# Patient Record
Sex: Male | Born: 1940
Health system: Southern US, Community
[De-identification: ages and names within clinical notes are randomized; demographics above are authoritative.]

## PROBLEM LIST (undated history)

## (undated) DIAGNOSIS — I10 Essential (primary) hypertension: Secondary | ICD-10-CM

## (undated) DIAGNOSIS — T4145XA Adverse effect of unspecified anesthetic, initial encounter: Secondary | ICD-10-CM

## (undated) DIAGNOSIS — Z8041 Family history of malignant neoplasm of ovary: Secondary | ICD-10-CM

## (undated) DIAGNOSIS — G459 Transient cerebral ischemic attack, unspecified: Secondary | ICD-10-CM

## (undated) DIAGNOSIS — N390 Urinary tract infection, site not specified: Secondary | ICD-10-CM

## (undated) DIAGNOSIS — Z8546 Personal history of malignant neoplasm of prostate: Secondary | ICD-10-CM

## (undated) DIAGNOSIS — K219 Gastro-esophageal reflux disease without esophagitis: Secondary | ICD-10-CM

## (undated) DIAGNOSIS — J45909 Unspecified asthma, uncomplicated: Secondary | ICD-10-CM

## (undated) DIAGNOSIS — I2510 Atherosclerotic heart disease of native coronary artery without angina pectoris: Secondary | ICD-10-CM

## (undated) DIAGNOSIS — Z85820 Personal history of malignant melanoma of skin: Secondary | ICD-10-CM

## (undated) DIAGNOSIS — I341 Nonrheumatic mitral (valve) prolapse: Secondary | ICD-10-CM

## (undated) DIAGNOSIS — T8859XA Other complications of anesthesia, initial encounter: Secondary | ICD-10-CM

## (undated) DIAGNOSIS — C801 Malignant (primary) neoplasm, unspecified: Secondary | ICD-10-CM

## (undated) HISTORY — PX: APPENDECTOMY: SHX54

## (undated) HISTORY — DX: Family history of malignant neoplasm of ovary: Z80.41

## (undated) HISTORY — DX: Personal history of malignant melanoma of skin: Z85.820

## (undated) HISTORY — DX: Gastro-esophageal reflux disease without esophagitis: K21.9

## (undated) HISTORY — DX: Essential (primary) hypertension: I10

## (undated) HISTORY — DX: Personal history of malignant neoplasm of prostate: Z85.46

## (undated) HISTORY — DX: Nonrheumatic mitral (valve) prolapse: I34.1

## (undated) HISTORY — DX: Atherosclerotic heart disease of native coronary artery without angina pectoris: I25.10

---

## 1997-10-31 ENCOUNTER — Ambulatory Visit (HOSPITAL_COMMUNITY): Admission: RE | Admit: 1997-10-31 | Discharge: 1997-10-31 | Payer: Self-pay | Admitting: Family Medicine

## 1998-04-06 ENCOUNTER — Ambulatory Visit (HOSPITAL_COMMUNITY): Admission: RE | Admit: 1998-04-06 | Discharge: 1998-04-06 | Payer: Self-pay | Admitting: Family Medicine

## 1998-04-06 ENCOUNTER — Encounter: Payer: Self-pay | Admitting: Family Medicine

## 2002-06-14 ENCOUNTER — Ambulatory Visit (HOSPITAL_COMMUNITY): Admission: RE | Admit: 2002-06-14 | Discharge: 2002-06-14 | Payer: Self-pay | Admitting: *Deleted

## 2004-02-06 ENCOUNTER — Ambulatory Visit (HOSPITAL_COMMUNITY): Admission: RE | Admit: 2004-02-06 | Discharge: 2004-02-06 | Payer: Self-pay | Admitting: Family Medicine

## 2004-02-20 ENCOUNTER — Ambulatory Visit (HOSPITAL_COMMUNITY): Admission: RE | Admit: 2004-02-20 | Discharge: 2004-02-20 | Payer: Self-pay | Admitting: Family Medicine

## 2004-03-22 ENCOUNTER — Encounter: Admission: RE | Admit: 2004-03-22 | Discharge: 2004-03-22 | Payer: Self-pay | Admitting: Family Medicine

## 2004-04-05 ENCOUNTER — Ambulatory Visit (HOSPITAL_COMMUNITY): Admission: RE | Admit: 2004-04-05 | Discharge: 2004-04-05 | Payer: Self-pay | Admitting: Orthopedic Surgery

## 2008-05-05 ENCOUNTER — Encounter: Payer: Self-pay | Admitting: Internal Medicine

## 2009-09-19 ENCOUNTER — Encounter (HOSPITAL_COMMUNITY): Admission: RE | Admit: 2009-09-19 | Discharge: 2009-11-22 | Payer: Self-pay | Admitting: Internal Medicine

## 2009-12-22 ENCOUNTER — Ambulatory Visit (HOSPITAL_COMMUNITY): Admission: RE | Admit: 2009-12-22 | Discharge: 2009-12-22 | Payer: Self-pay | Admitting: Internal Medicine

## 2010-02-05 ENCOUNTER — Ambulatory Visit: Payer: Self-pay | Admitting: Internal Medicine

## 2010-02-05 DIAGNOSIS — R011 Cardiac murmur, unspecified: Secondary | ICD-10-CM | POA: Insufficient documentation

## 2010-02-05 DIAGNOSIS — I10 Essential (primary) hypertension: Secondary | ICD-10-CM | POA: Insufficient documentation

## 2010-02-05 DIAGNOSIS — M810 Age-related osteoporosis without current pathological fracture: Secondary | ICD-10-CM | POA: Insufficient documentation

## 2010-02-05 DIAGNOSIS — R059 Cough, unspecified: Secondary | ICD-10-CM | POA: Insufficient documentation

## 2010-02-05 DIAGNOSIS — R05 Cough: Secondary | ICD-10-CM

## 2010-02-06 ENCOUNTER — Ambulatory Visit: Payer: Self-pay | Admitting: Internal Medicine

## 2010-02-07 ENCOUNTER — Encounter: Payer: Self-pay | Admitting: Internal Medicine

## 2010-02-07 ENCOUNTER — Ambulatory Visit (HOSPITAL_COMMUNITY): Admission: RE | Admit: 2010-02-07 | Discharge: 2010-02-07 | Payer: Self-pay | Admitting: Internal Medicine

## 2010-02-07 ENCOUNTER — Ambulatory Visit: Payer: Self-pay | Admitting: Cardiovascular Disease

## 2010-02-07 ENCOUNTER — Ambulatory Visit: Payer: Self-pay

## 2010-02-09 ENCOUNTER — Ambulatory Visit: Payer: Self-pay | Admitting: Internal Medicine

## 2010-02-09 DIAGNOSIS — J986 Disorders of diaphragm: Secondary | ICD-10-CM | POA: Insufficient documentation

## 2010-02-14 ENCOUNTER — Ambulatory Visit (HOSPITAL_COMMUNITY): Admission: RE | Admit: 2010-02-14 | Discharge: 2010-02-14 | Payer: Self-pay | Admitting: Cardiology

## 2010-02-20 ENCOUNTER — Inpatient Hospital Stay (HOSPITAL_COMMUNITY)
Admission: EM | Admit: 2010-02-20 | Discharge: 2010-02-21 | Payer: Self-pay | Source: Home / Self Care | Admitting: Emergency Medicine

## 2010-02-20 ENCOUNTER — Ambulatory Visit (HOSPITAL_COMMUNITY): Admission: RE | Admit: 2010-02-20 | Discharge: 2010-02-20 | Payer: Self-pay | Admitting: Cardiology

## 2010-02-26 ENCOUNTER — Ambulatory Visit: Payer: Self-pay | Admitting: Thoracic Surgery (Cardiothoracic Vascular Surgery)

## 2010-02-26 ENCOUNTER — Encounter: Payer: Self-pay | Admitting: Internal Medicine

## 2010-03-01 ENCOUNTER — Encounter: Payer: Self-pay | Admitting: Internal Medicine

## 2010-03-12 ENCOUNTER — Telehealth: Payer: Self-pay | Admitting: Internal Medicine

## 2010-03-14 ENCOUNTER — Ambulatory Visit (HOSPITAL_COMMUNITY)
Admission: RE | Admit: 2010-03-14 | Discharge: 2010-03-14 | Payer: Self-pay | Source: Home / Self Care | Admitting: Internal Medicine

## 2010-03-19 ENCOUNTER — Telehealth: Payer: Self-pay | Admitting: Internal Medicine

## 2010-04-06 ENCOUNTER — Ambulatory Visit: Payer: Self-pay | Admitting: Thoracic Surgery (Cardiothoracic Vascular Surgery)

## 2010-04-30 ENCOUNTER — Ambulatory Visit (HOSPITAL_COMMUNITY)
Admission: RE | Admit: 2010-04-30 | Discharge: 2010-05-01 | Payer: Self-pay | Source: Home / Self Care | Attending: Cardiology | Admitting: Cardiology

## 2010-04-30 ENCOUNTER — Ambulatory Visit: Admit: 2010-04-30 | Payer: Self-pay | Admitting: Thoracic Surgery (Cardiothoracic Vascular Surgery)

## 2010-04-30 HISTORY — PX: CORONARY ANGIOPLASTY WITH STENT PLACEMENT: SHX49

## 2010-05-02 LAB — BASIC METABOLIC PANEL
BUN: 17 mg/dL (ref 6–23)
CO2: 31 mEq/L (ref 19–32)
Calcium: 9.3 mg/dL (ref 8.4–10.5)
Chloride: 105 mEq/L (ref 96–112)
Creatinine, Ser: 1.13 mg/dL (ref 0.4–1.5)
GFR calc Af Amer: >60 mL/min (ref >60)
GFR calc non Af Amer: >60 mL/min (ref >60)
Glucose, Bld: 97 mg/dL (ref 70–99)
Potassium: 4 mEq/L (ref 3.5–5.1)
Sodium: 141 mEq/L (ref 135–145)

## 2010-05-02 LAB — PLATELET INHIBITION P2Y12
P2Y12 % Inhibition: 0 %
Platelet Function  P2Y12: 344 [PRU] (ref 194–418)
Platelet Function Baseline: 319 [PRU] (ref 194–418)

## 2010-05-02 LAB — CBC
HCT: 41.2 % (ref 39.0–52.0)
Hemoglobin: 13.9 g/dL (ref 13.0–17.0)
MCH: 30.7 pg (ref 26.0–34.0)
MCHC: 33.7 g/dL (ref 30.0–36.0)
MCV: 90.9 fL (ref 78.0–100.0)
Platelets: 193 10*3/uL (ref 150–400)
RBC: 4.53 MIL/uL (ref 4.22–5.81)
RDW: 13.4 % (ref 11.5–15.5)
WBC: 11.7 10*3/uL — ABNORMAL HIGH (ref 4.0–10.5)

## 2010-05-04 ENCOUNTER — Ambulatory Visit (HOSPITAL_COMMUNITY)
Admission: RE | Admit: 2010-05-04 | Discharge: 2010-05-04 | Payer: Self-pay | Source: Home / Self Care | Attending: Thoracic Surgery (Cardiothoracic Vascular Surgery) | Admitting: Thoracic Surgery (Cardiothoracic Vascular Surgery)

## 2010-05-05 ENCOUNTER — Encounter: Payer: Self-pay | Admitting: Family Medicine

## 2010-05-07 ENCOUNTER — Ambulatory Visit
Admission: RE | Admit: 2010-05-07 | Discharge: 2010-05-07 | Payer: Self-pay | Source: Home / Self Care | Attending: Thoracic Surgery (Cardiothoracic Vascular Surgery) | Admitting: Thoracic Surgery (Cardiothoracic Vascular Surgery)

## 2010-05-07 ENCOUNTER — Encounter: Payer: Self-pay | Admitting: Internal Medicine

## 2010-05-07 LAB — PLATELET INHIBITION P2Y12
P2Y12 % Inhibition: 30 %
Platelet Function  P2Y12: 143 [PRU] — ABNORMAL LOW (ref 194–418)
Platelet Function Baseline: 203 [PRU] (ref 194–418)

## 2010-05-08 NOTE — Assessment & Plan Note (Signed)
OFFICE VISIT  [name], [name] DOB:  May 16, 1940                                        May 07, 2010 CHART #:  [id number]  HISTORY OF PRESENT ILLNESS:  [name] returns for followup of severe mitral regurgitation.  He was last seen here in the office on April 06, 2010.  He was originally seen in consultation on February 26, 2010 and a full history and physical exam was dictated at that time.  [name] recently underwent PCI and stenting of his right coronary artery by Dr. Jacinto Halim, this was performed on April 30, 2010.  His coronary artery was stented with a large caliber (3.5 mm) very flex stent (non-drug eluting) with an excellent angiographic result.  He has not had any complications and he returns to our office for followup today.  Overall, he is getting along quite well.  He did have his stent performed via the right femoral artery approach.  His original catheterization was performed via the left femoral artery approach and imaging of the intra-abdominal aorta and iliac vessels at that time revealed no significant aortoiliac disease.  [name] is interested in proceeding with elective mitral valve repair at the end of February.  PHYSICAL EXAMINATION:  GENERAL:  Notable for well-appearing male. VITAL SIGNS:  Blood pressure 133/78, pulse 70 and regular, and oxygen saturation 98% on room air. CHEST:  Auscultation of the chest reveals clear breath sounds. CARDIOVASCULAR:  Regular rate and rhythm with a prominent grade 4/6 systolic murmur. ABDOMEN:  Soft and nontender. EXTREMITIES:  Warm and well perfused.  There is some bruising in the right groin from his recent catheterization.  There is no obvious sign of a false aneurysm or other problem.  The remainder of his physical exam is unchanged from previously.  IMPRESSION:  Mitral valve prolapse with severe mitral regurgitation. [name] has undergone successful percutaneous coronary  intervention and stenting of his high-grade right coronary artery stenosis by Dr. Jacinto Halim, last week.  PLAN:  We tentatively plan to proceed with right mini thoracotomy for mitral valve repair on Wednesday, February 29.  We will call in, a prescription for amiodarone to begin 1 week prior to surgery to decrease his risk of perioperative arrhythmias.  [name] will stop taking Plavix 5 days prior to surgery in anticipation of surgery.  We will see him back in the office on February 27 for any last minute questions.  Of note, [name] underwent a P2Y12 Plavix platelet inhibition test this week.  It reveals his baseline platelet function was on the low side at 203.  The P2Y12 platelet function was 143 consistent with 30% inhibition on Plavix therapy.  Salvatore Decent. [name], M.D. Electronically Signed  CHO/MEDQ  D:  05/07/2010  T:  05/07/2010  Job:  [id number]  cc:   Cristy Hilts. Jacinto Halim, MD Massie Maroon, MD Pramod P. Pearlean Brownie, MD Kalman Shan, MD

## 2010-05-15 NOTE — Letter (Signed)
Summary: Pt's Medical Hx/Patient  Pt's Medical Hx/Patient   Imported By: Sherian Rein 02/07/2010 14:38:19  _____________________________________________________________________  External Attachment:    Type:   Image     Comment:   External Document

## 2010-05-17 NOTE — Assessment & Plan Note (Signed)
Summary: CHRONIC COUGH/OK PER JEN//LED   Visit Type:  Initial Consult Copy to:  Dr. Pearson Grippe Primary Javae Braaten/Referring Tanayia Wahlquist:  Dr. Pearson Grippe  CC:  Pulmonary Consult for dry cough.  History of Present Illness: IOV 02/05/2010:   70 year old male. Known GERD since 1990s compliant with nexium at baseline till MArch 2011. Then sporadic use with final dc in July 2011 (due to lack of interest in taking medicines in general and also successful wean). Otherwise,  baseline till Feb 2011. Developed first cold and cough. Took hydromet and cough resolved in 1 week. Then in end of MArch MArch 2011 went to [place] for 2 weeks. Was Cold and raining there. At end of  trip, developed dry cough with raspy voice but did not have fever or sputum. Was seen by PMD and was given zpak and 1 month singulair. Cough resolved in 2 weeks. He was doing fine till mid Sept 2011. Went to [place] on  2 week vacation. Cold and Rainy weather in [place]. At end of tri while in [place] he developed dry cough and some sneezing. Upon return Rx with Zpak first with hydromet prn  but cough persisted. 10 days ago ried singulair for a few days but no relief so stopped. Now still with cough. Noted that 70% of the 30 of people in [place] vacation had cough but all have cough rsolved except MR. Isharanit. Currently with dry cough with raspy voice. Worst when sleeping flat wihtout pillow. Improved when sleeping with many pillows and chewing raw ginger.  Also notee that at baseline nexium always helps with raspy voice and dc of nexium returns in relapse of nexium. Cough occassionally associated with sneezing. Denies sinus drainage, edema, dyspnea, chest pain, fever, weight loss, sputum, wheezing (although family thinks so when he is quiet). Admits to no decrease in treadmill exercise - 5 miles and walkng park  Plans to travel to [place] 02/20/2010  NOTE: d/w Dr Selena Batten his PMD - old records being faxed.  Preventive Screening-Counseling &  Management  Alcohol-Tobacco     Smoking Status: never  Current Medications (verified): 1)  Hyzaar 100-12.5 Mg Tabs (Losartan Potassium-Hctz) .... Take 1 Tablet By Mouth Once A Day  Allergies (verified): 1)  ! Pcn  Past History:  Past Medical History: #Hypertension (MIld)  - dxed 2008. STarted on hyzaar  - 15# weigh loss in 2008/2009 #Rib fracture - 1992 following lifting book case  - Osteopenia - diagnosed 1992.  - "bone scan showed 25% reduction in density" -   - 1992 commenced fosfamax. DC'ed due to stomach pain (labelled as GERD). Started Boniva 1997 -> 2010 moved back to generic Fosfamax due to [employer] -> worsening "stomach acidity"/GERD again -> moved to injection Boniva Q 3 months end 2010 #Normal Vitamin D Levels #Pneumonia 02/06/2004  - Treated as outpatient #GERD  - [id number]. Loves oily, spicy food    - Normal UGI and LGI scope 2004 by Dr. Virginia Rochester. Hx of mild redness on repeat scope 2007 NOS  - Nexium two times a day since 1997. Dc'ed in March 2011 on own accord after giving a trial of weaning #SPRING ALLERGIES  - new diagnosis since 2010  - strted Zyrtec and Xyzal in 2010 #Hx of ASTHMA   - "trace of asthma" as thought of by family members becuase he states they think he wheezes or talks too fast #Hx of MURMUr NOS  - does not recollect echo    Past Surgical History: Normal UGI scope  and cOlonoscopy - 2004 by Dr. Fransisco Beau  Family History: Father-died age 41 old age and heart failure Mother-died age 17, diabetes, HTN Pt has 5 brothers and 2 sisters. Patient is 4th in order of siblings 1st brother-alive age 68 2nd brother-died age 28 liver failure 3rd brother-died age 57 lung cancer 4th brother-patient 5th Brother-alive and in good health 1-Sister-died age 86-lots of issues 2nd sister-alive age 21 in good health  Social History: Patient never smoked.  ETOH: 2 drinks per week Retired [place] to [place] in Sept 2011 MarriedSmoking Status:   never  Review of Systems       The patient complains of non-productive cough, sore throat, and sneezing.  The patient denies shortness of breath with activity, shortness of breath at rest, productive cough, coughing up blood, chest pain, irregular heartbeats, acid heartburn, indigestion, loss of appetite, weight change, abdominal pain, difficulty swallowing, tooth/dental problems, headaches, nasal congestion/difficulty breathing through nose, itching, ear ache, anxiety, depression, hand/feet swelling, joint stiffness or pain, rash, change in color of mucus, and fever.    Vital Signs:  Patient profile:   70 year old male Height:      67 inches Weight:      165.25 pounds BMI:     25.98 O2 Sat:      96 % on Room air Temp:     97.8 degrees F oral Pulse rate:   72 / minute BP sitting:   108 / 62  (right arm) Cuff size:   regular  Vitals Entered By: Carron Curie CMA (February 05, 2010 4:34 PM)  O2 Flow:  Room air CC: Pulmonary Consult for dry cough Comments Medications reviewed with patient Carron Curie CMA  February 05, 2010 4:32 PM Daytime phone number verified with patient.    Physical Exam  General:  well developed, well nourished, in no acute distress Head:  normocephalic and atraumatic Eyes:  PERRLA/EOM intact; conjunctiva and sclera clear Ears:  TMs intact and clear with normal canals Nose:  no deformity, discharge, inflammation, or lesions Mouth:  no deformity or lesions Neck:  no masses, thyromegaly, or abnormal cervical nodes Chest Wall:  no deformities noted Lungs:  clear bilaterally to auscultation and percussion Heart:  regular rhythm, normal rate, no rubs, no gallops, and Grade  3/6 SEM loudest at apex -->carotid conduction + Abdomen:  bowel sounds positive; abdomen soft and non-tender without masses, or organomegaly Msk:  no deformity or scoliosis noted with normal posture Pulses:  pulses normal Extremities:  no clubbing, cyanosis, edema, or deformity  noted Neurologic:  CN II-XII grossly intact with normal reflexes, coordination, muscle strength and tone Skin:  intact without lesions or rashes Cervical Nodes:  no significant adenopathy Axillary Nodes:  no significant adenopathy Psych:  alert and cooperative; normal mood and affect; normal attention span and concentration   Impression & Recommendations:  Problem # 1:  COUGH (ICD-786.2) Assessment New Sounds like post viral reactive cough accentuated by GERD +/- sinus drainage (Based on hx of cough worse lying flat, active gerd, being off nexium, worsening raspy voice when off nexium, and scratchy sensation in throat). There might or might not be underlying asthma'; atleast his family is concerned about. REassured that findings are not c/w TB but he iss keen on a cxr.   PLAN Advised that we need to control GERD and sinus issus otherwise there is risk of deterioration to cyclical cough For gerd - avoid spices, and oily food. restart nexium two times a day For sinus drainge -  use netti pot and sample steroid nasal spray Will get cxr and full PFT. If thse are negative will get methacholine challenge test do all this week and decide on [place] trip by end of this week or by next week Orders: Pulmonary Referral (Pulmonary) T-2 View CXR (71020TC) Consultation Level III (60454)  Problem # 2:  CARDIAC MURMUR, SYSTOLIC (ICD-785.2) Assessment: New get echo -ok per Dr. Selena Batten get old echo report Orders: Echo Referral (Echo) Consultation Level III (09811)  Medications Added to Medication List This Visit: 1)  Hyzaar 100-12.5 Mg Tabs (Losartan potassium-hctz) .... Take 1 tablet by mouth once a day 2)  Nexium 40 Mg Cpdr (Esomeprazole magnesium) .... By mouth twice daily. take one half hour before eating. 3)  Nasonex 50 Mcg/act Susp (Mometasone furoate) .... Two puffs each nostril daily  Patient Instructions: 1)  #COUGH  2)   - this could be due to post viral reactive cough, nasal drip, acid  reflux and/or undiagnosed asthma. We need to sort out all this 3)   - restart nexium  two times a day 4)   - avoid all spices and oily food 5)   - do not go to bed for 3h after dinner 6)  - take nasal steroid sample two times a day  7)   - do netti pot daily 8)  - have cxr 9)   - have full PFTs at [facility] or [place] 10)   - depending on cxr/pft result, might order more test 11)  #MURMUR 12)   -  Dr Lonny Prude getting an echo 13)  #FOLLOWUP 14)    we will discuss over phone Prescriptions: NASONEX 50 MCG/ACT  SUSP (MOMETASONE FUROATE) Two puffs each nostril daily  #1 x 2   Entered and Authorized by:   Kalman Shan MD   Signed by:   Kalman Shan MD on 02/05/2010   Method used:   Historical   RxID:   [id number] NEXIUM 40 MG  CPDR (ESOMEPRAZOLE MAGNESIUM) By mouth twice daily. Take one half hour before eating.  #60 x 0   Entered and Authorized by:   Kalman Shan MD   Signed by:   Kalman Shan MD on 02/05/2010   Method used:   Historical   RxID:   [id number]

## 2010-05-17 NOTE — Letter (Signed)
Summary: [facility]  [facility]   Imported By: Lester [place] 03/02/2010 09:25:48  _____________________________________________________________________  External Attachment:    Type:   Image     Comment:   External Document  Appended Document: [facility] Candise Bowens, plse fax my office notes to Dr. Tressie Stalker of [facility]. Thanks, MR  Appended Document: [facility] note faxed.

## 2010-05-17 NOTE — Miscellaneous (Signed)
Summary: Orders Update pft charges  Clinical Lists Changes  Orders: Added new Service order of Carbon Monoxide diffusing w/capacity (94720) - Signed Added new Service order of Lung Volumes (94240) - Signed Added new Service order of Spirometry (Pre & Post) (94060) - Signed 

## 2010-05-17 NOTE — Progress Notes (Signed)
Summary: Sniff test  Phone Note Other Incoming   Caller: Dr. Marchelle Gearing Summary of Call: Dr. Marchelle Gearing wants a SNIFF TEST ordered for this pt to check to see if his diapraghm is paralyzed or not. Order placed. pt aware of need for test.  Initial call taken by: Carron Curie CMA,  March 12, 2010 4:32 PM

## 2010-05-17 NOTE — Assessment & Plan Note (Signed)
Summary: discuss test   Visit Type:  Follow-up Copy to:  Dr. Pearson Grippe Primary Ordean Fouts/Referring Tupac Jeffus:  Dr. Pearson Grippe  CC:  Pt here to discuss results of PFT. Marland Kitchen  History of Present Illness: IOV 02/05/2010:   70 year old male. Known GERD since 1990s compliant with nexium at baseline till MArch 2011. Then sporadic use with final dc in July 2011 (due to lack of interest in taking medicines in general and also successful wean). Otherwise,  baseline till Feb 2011. Developed first cold and cough. Took hydromet and cough resolved in 1 week. Then in end of MArch MArch 2011 went to [place] for 2 weeks. Was Cold and raining there. At end of  trip, developed dry cough with raspy voice but did not have fever or sputum. Was seen by PMD and was given zpak and 1 month singulair. Cough resolved in 2 weeks. He was doing fine till mid Sept 2011. Went to [place] on  2 week vacation. Cold and Rainy weather in [place]. At end of tri while in [place] he developed dry cough and some sneezing. Upon return Rx with Zpak first with hydromet prn  but cough persisted. 10 days ago ried singulair for a few days but no relief so stopped. Now still with cough. Noted that 70% of the 8 of people in [place] vacation had cough but all have cough rsolved except MR. Isharanit. Currently with dry cough with raspy voice. Worst when sleeping flat wihtout pillow. Improved when sleeping with many pillows and chewing raw ginger.  Also notee that at baseline nexium always helps with raspy voice and dc of nexium returns in relapse of nexium. Cough occassionally associated with sneezing. Denies sinus drainage, edema, dyspnea, chest pain, fever, weight loss, sputum, wheezing (although family thinks so when he is quiet). Admits to no decrease in treadmill exercise - 5 miles and walkng park  REC 1. PPI and Nasal ALLERGY control  2. Full PFT 3. CXR 4. ECHO For PAN SYSTOLIC MURMUR  February 09, 2010: Cough nearly gone after restarting nexium and  taking zyrtec. Review of tests today. CXR shows raised RT hemidiaphragm - no change since 2005. PFTs are c/w this and shos restriction with normal DLCO. This will explain the years of 'shallwo breathinng" he has. However, ther is a 13%, 300cc improvment after bronchodilator in FVC that I suspect he might have underlying asthma. However, cough has rapidly improved iwth sinus and GERD control. ECHO done raises issue of SEVERE MR with MILD LV and LA DILATATION but normal EF. Today, he states his cardiologist is Dr. Tomma Lightning and gives hix iof prior cardiac stress tests with him.   Preventive Screening-Counseling & Management  Alcohol-Tobacco     Smoking Status: never  Current Medications (verified): 1)  Hyzaar 100-12.5 Mg Tabs (Losartan Potassium-Hctz) .... Take 1 Tablet By Mouth Once A Day 2)  Nexium 40 Mg  Cpdr (Esomeprazole Magnesium) .... By Mouth Twice Daily. Take One Half Hour Before Eating. 3)  Nasonex 50 Mcg/act  Susp (Mometasone Furoate) .... Two Puffs Each Nostril Daily 4)  Zyrtec Allergy 10 Mg Tabs (Cetirizine Hcl) .... Take 1 Tablet By Mouth Once A Day 5)  Calcium 500 Mg Tabs (Calcium) .... Take 1 Tablet By Mouth Once A Day 6)  Vitamin C 100 Mg Tabs (Ascorbic Acid) .... Take 1 Tablet By Mouth Once A Day 7)  Aspir-Low 81 Mg Tbec (Aspirin) .... Take 1 Tablet By Mouth Once A Day 8)  Glucosamine 500 Mg Caps (  Glucosamine Sulfate) .... Take 1 Tablet By Mouth Once A Day 9)  Boniva Injection .... Every Three Months 10)  Nexium 40 Mg Cpdr (Esomeprazole Magnesium) .... Take 1 Tablet By Mouth Once A Day 11)  Nasonex 50 Mcg/act Susp (Mometasone Furoate) .... 2 Sprays Each Nostril Once Daily  Allergies (verified): 1)  ! Pcn  Past History:  Past medical, surgical, family and social histories (including risk factors) reviewed, and no changes noted (except as noted below).  Past Medical History: Reviewed history from 02/05/2010 and no changes required. #Hypertension (MIld)  - dxed 2008.  STarted on hyzaar  - 15# weigh loss in 2008/2009 #Rib fracture - 1992 following lifting book case  - Osteopenia - diagnosed 1992.  - "bone scan showed 25% reduction in density" -   - 1992 commenced fosfamax. DC'ed due to stomach pain (labelled as GERD). Started Boniva 1997 -> 2010 moved back to generic Fosfamax due to [employer] -> worsening "stomach acidity"/GERD again -> moved to injection Boniva Q 3 months end 2010 #Normal Vitamin D Levels #Pneumonia 02/06/2004  - Treated as outpatient #GERD  - [id number]. Loves oily, spicy food    - Normal UGI and LGI scope 2004 by Dr. Virginia Rochester. Hx of mild redness on repeat scope 2007 NOS  - Nexium two times a day since 1997. Dc'ed in March 2011 on own accord after giving a trial of weaning #SPRING ALLERGIES  - new diagnosis since 2010  - strted Zyrtec and Xyzal in 2010 #Hx of ASTHMA   - "trace of asthma" as thought of by family members becuase he states they think he wheezes or talks too fast #Hx of MURMUr NOS  - does not recollect echo    Past Surgical History: Reviewed history from 02/05/2010 and no changes required. Normal UGI scope and cOlonoscopy - 2004 by Dr. Fransisco Beau  Family History: Reviewed history from 02/05/2010 and no changes required. Father-died age 46 old age and heart failure Mother-died age 26, diabetes, HTN Pt has 5 brothers and 2 sisters. Patient is 4th in order of siblings 1st brother-alive age 23 2nd brother-died age 69 liver failure 3rd brother-died age 23 lung cancer 4th brother-patient 5th Brother-alive and in good health 1-Sister-died age 65-lots of issues 2nd sister-alive age 36 in good health  Social History: Reviewed history from 02/05/2010 and no changes required. Patient never smoked.  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  Comments Medications reviewed with patient Carron Curie CMA  February 09, 2010 1:39 PM Daytime phone number verified with patient.    Physical Exam  General:  well developed, well nourished, in no acute distress Head:  normocephalic and atraumatic Eyes:  PERRLA/EOM intact; conjunctiva and sclera clear Ears:  TMs intact and clear with normal canals Nose:  no deformity, discharge, inflammation,  or lesions Mouth:  no deformity or lesions Neck:  no masses, thyromegaly, or abnormal cervical nodes Chest Wall:  no deformities noted Lungs:  clear bilaterally to auscultation and percussion Heart:  regular rhythm, normal rate, no rubs, no gallops, and Grade  3/6 SEM loudest at apex -->carotid conduction + Abdomen:  bowel sounds positive; abdomen soft and non-tender without masses, or organomegaly Msk:  no deformity or scoliosis noted with normal posture Pulses:  pulses normal Extremities:  no clubbing, cyanosis, edema, or deformity noted Neurologic:  CN II-XII grossly intact with normal reflexes, coordination, muscle strength and tone Skin:  intact without lesions or rashes Cervical Nodes:  no significant adenopathy Axillary Nodes:  no significant adenopathy Psych:  alert and cooperative; normal mood and affect; normal attention span and concentration   Impression & Recommendations:  Problem # 1:  COUGH (ICD-786.2) Assessment Improved  Improved to nearlyu resolved with sinus conttrol with zyrtec and Nexicum for PPI. but PFTs shows positive BD response. I think there are 4 elements to his cough. A Post viral reactive element. SInus and GERD  - controlling these have significantly improved/nearly resolved  cough. But there is possibility of asthma. We discussed and decided to observe given improvemnt. If cough recurs, then we will dp spirometry at time of illness  Orders: Est. 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 Patient Level III (98119)  Problem # 3:  CARDIAC MURMUR, SYSTOLIC (ICD-785.2) Assessment: Deteriorated  worsening Mitral Regurg with LV dilatation  plan see Dr Tomma Lightning asap - I spoke to DR [facility] and had echo report faxed. Dr Shon Baton will call patient avoid [place] trip until after seeing Dr. Shon Baton  Orders: Est. Patient Level III (14782)  Medications Added to Medication List This Visit: 1)  Zyrtec Allergy 10 Mg Tabs (Cetirizine hcl) .... Take 1 tablet by mouth once a day 2)  Calcium 500 Mg Tabs (Calcium) .... Take 1 tablet by mouth once a day 3)  Vitamin C 100 Mg Tabs (Ascorbic acid) .... Take 1 tablet by mouth once a day 4)  Aspir-low 81 Mg Tbec (Aspirin) .... Take 1 tablet by mouth once a day 5)  Glucosamine 500 Mg Caps (Glucosamine sulfate) .... Take 1 tablet by mouth once a day 6)  Boniva Injection  .... Every three months 7)  Nexium 40 Mg Cpdr (Esomeprazole magnesium) .... Take 1 tablet by mouth once a day 8)  Nasonex 50 Mcg/act Susp (Mometasone furoate) .... 2 sprays each nostril once daily  Patient Instructions: 1)  I will talk to Dr. 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 He is no longer iwth [facility]. He is now [place]  [facility]. fAx is [phone]. Please have it changed in [facility] Too  Appended Document: discuss test faxed.

## 2010-05-17 NOTE — Progress Notes (Signed)
Summary: reports to be faxed  Phone Note Outgoing Call   Summary of Call: a) pleas fax his PFT, my office note, CXR, ECHO - from his w2 MRNs to Dr. Silvestre Mesi ta [facility] b) please fax his sniff test to him [phone]. You have to cal first  Initial call taken by: Kalman Shan MD,  March 19, 2010 6:33 PM  Follow-up for Phone Call        records faxed to Dr. Silvestre Mesi and to the pt. pt aware. Carron Curie CMA  March 22, 2010 4:14 PM

## 2010-05-17 NOTE — Letter (Signed)
Summary: Referral & Patient History / [facility]   Referral & Patient History / [facility]   Imported By: Lennie Odor 03/22/2010 14:25:00  _____________________________________________________________________  External Attachment:    Type:   Image     Comment:   External Document

## 2010-05-18 NOTE — Discharge Summary (Signed)
  NAMEMESHACH, [name] NO.:  [id number]  MEDICAL RECORD NO.:  [id number]          PATIENT TYPE:  OIB  LOCATION:  6529                         FACILITY:  [facility]  PHYSICIAN:  Cristy Hilts. Jacinto Halim, MD       DATE OF BIRTH:  04/01/1941  DATE OF ADMISSION:  04/30/2010 DATE OF DISCHARGE:  05/01/2010                              DISCHARGE SUMMARY   DISCHARGE DIAGNOSES: 1. Atherosclerotic heart disease, successful percutaneous transluminal     coronary angioplasty and stenting of the mid and proximal right     coronary artery. 2. Hypertension. 3. Leukemia.  HISTORY:  [name] is a pleasant 70 year old gentleman who has severe mitral valve prolapse with severe mitral valve regurgitation.  He is scheduled for elective mitral valve repair by Dr. Tressie Stalker. During cardiac catheterization, he was found to have severe stenosis of the proximal right coronary artery and high-grade 95% stenosis of the mid right coronary artery.  Given this, he opted for minimal revision surgery for mitral valve repair.  Hence, we decided to proceed with stenting of his right coronary.  PROCEDURE PERFORMED:  IVUS-guided PTCA and stenting of mid right coronary and proximal right coronary artery.  BRIEF HISTORY AND HOSPITAL COURSE:  [name] is a 70 year old gentleman with a history of hypertension and hyperlipidemia.  He has been doing well and had complained of mild shortness of breath.  He was eventually found to have pseudomitral regurgitation.  After extensive discussion regarding proceeding with open bypass surgery versus minimally invasive surgery with mitral valve repair, he opted for PTCA and stenting and minimally invasive repair of the mitral valve.  He was now brought to the cardiac catheterization lab for IVUS guided intervention.  The procedure was some time in November 2011.  Procedure performed was IVUS-guided intervention to mid RCA with implantation of a 4.0  x 12 mm nondrug eluting VeriFLEX stent with reduction of stenosis from 95% to 0% and proximal right coronary artery with a 3.5 x 20 mm VeriFLEX stent with reduction of stenosis from 80% to 0%.  RECOMMENDATIONS:  The patient will be discharge home.  He will be followed up by Dr. Tressie Stalker.  DISCHARGE MEDICATIONS: 1. Plavix 75 mg p.o. daily. 2. Aspirin 325 mg p.o. daily. 3. Boniva, he will continue as every 3 months. 4. Calcium citrate 500 mg 2 tablets p.o. daily. 5. Crestor 10 mg p.o. daily. 6. Hyzaar 100/25 one p.o. daily. 7. Zyrtec 10 mg p.o. daily.  He will stop his Nexium and we will see how his GERD is, he will use Pepcid instead.  His labs remained stable postprocedure and EKG remained stable and the patient was asymptomatic.     Cristy Hilts. Jacinto Halim, MD     JRG/MEDQ  D:  05/01/2010  T:  05/01/2010  Job:  [id number]  cc:   Massie Maroon, MD  Electronically Signed by Yates Decamp MD on 05/18/2010 06:42:18 AM

## 2010-05-18 NOTE — Procedures (Signed)
[name], [name] NO.:  [id number]  MEDICAL RECORD NO.:  [id number]          PATIENT TYPE:  OIB  LOCATION:  6529                         FACILITY:  [facility]  PHYSICIAN:  Cristy Hilts. Jacinto Halim, MD       DATE OF BIRTH:  08/08/1940  DATE OF PROCEDURE:  04/30/2010 DATE OF DISCHARGE:                           CARDIAC CATHETERIZATION   PROCEDURE PERFORMED:  Percutaneous transluminal coronary angioplasty and stenting of the proximal and mid right coronary artery by intravascular ultrasound guidance.  SURGEON:  Cristy Hilts. Jacinto Halim, MD  INDICATIONS:  [name] is a 69-year gentleman with known coronary artery disease.  He had undergone cardiac catheterization on February 20, 2010, and was found to have a high-grade mid RCA and 80% proximal RCA stenosis with severe mitral valve regurgitation.  He has severe mitral prolapse.  After discussions regarding open mitral valve repair and single vessel coronary artery bypass grafting versus a PCI and minimally invasive mitral valve repair, the patient has opted for minimally invasive procedure.  After obtaining informed consent, he was brought to the Cardiac Catheterization Lab for IVUS-guided intervention to his right coronary artery which were focal lesions.  Right coronary artery:  The right coronary artery was a large vessel and a dominant vessel giving origin to moderate-sized PDA and a moderate- sized PL branch.  Proximal right coronary artery has an 80% long stenosis and the mid segment has a focal 95% stenosis.  INTRACORONARY VASCULAR ULTRASOUND DATA:  The proximal lesion lumen area measured 3.9 sq mm.  The mid lesion area could not be calculated as the catheter was hugging the lesion.  INTERVENTIONAL DATA:  Successful PTCA and direct stenting of the mid right coronary artery with a 4.0 x 12 mm VeriFLEX stent which was deployed at 10 atmospheric pressure for 60 seconds.  Successful PTCA and direct stenting of the  proximal right coronary with implantation of a 3.5 x 20 mm VeriFLEX stent.  This was deployed at 14 atmospheric pressure for 60 and 30 seconds each.  Having performed this, angiography revealed excellent results.  POST INTERVENTIONAL INTRACORONARY VASCULAR ULTRASOUND DATA:  The proximal lesion lumen area gain was from 3.9 sq mm to 11.48 sq mm.  Mid lumen area gain was from severe to 13.68 mm squared.  Excellent stent apposition and wall apposition was evident.  RECOMMENDATIONS:  The patient will be discharged home in the morning if he remains stable.  We will continue with aspirin and Plavix.  He will be scheduled for mitral valve repair by Dr. Tressie Stalker in the future.  TECHNIQUE OF PROCEDURE:  Under usual sterile precaution using a 6-French right femoral arterial access, a 6-French JR-4 guide catheter was utilized to engage the right coronary artery.  Using Angiomax for anticoagulation, Asahi Prowater guidewire was advanced into the right coronary artery.  Intracoronary nitroglycerine was also administered. Using Galaxy IVUS catheter, intravascular ultrasound interrogation of the right coronary artery was performed.  Having performed this, we measured the lumen stenosis length and also we measured the plaque burden.  Having performed this, we decided to stent this mid segment with a 4.0 x 12 mm VeriFLEX stent  which was a bare-metal stent, which was deployed at 10 atmospheric pressure for 60 seconds.  Having performed this, excellent results were evident.  Then, we decided to stent the proximal lesion with a 0.5 x 20 mm VeriFLEX.  Midsegment was 4.0 x 12.  The proximal segment was 3.5 x 20 mm VeriFLEX stent which was deployed at 12 atmospheric pressure for 60 seconds followed by 14 atmospheric pressure for 30 seconds pulling the balloon gently into the outside of the stent at the ostial right coronary artery.  Postprocedure intravascular ultrasound interrogation was again  performed and the results were confirmed.  Guide wire was withdrawn angiographically.  The guide catheter disengaged and pulled out of the body.  Right femoral arteriography was performed through the arterial access sheath and the access was closed with 6-French Angio-Seal with excellent hemostasis.  Mild oozing was evident which was injected with epinephrine and lidocaine in the Catheterization Lab.  No immediate complications.     Cristy Hilts. Jacinto Halim, MD     JRG/MEDQ  D:  04/30/2010  T:  04/30/2010  Job:  [id number]  cc:   Salvatore Decent. [name], M.D.  Electronically Signed by Yates Decamp MD on 05/18/2010 06:42:30 AM

## 2010-06-06 NOTE — Letter (Signed)
Summary: [facility]  [facility]   Imported By: Lennie Odor 05/31/2010 17:10:15  _____________________________________________________________________  External Attachment:    Type:   Image     Comment:   External Document

## 2010-06-11 ENCOUNTER — Other Ambulatory Visit: Payer: Self-pay | Admitting: Thoracic Surgery (Cardiothoracic Vascular Surgery)

## 2010-06-11 ENCOUNTER — Encounter: Payer: Medicare HMO | Admitting: Thoracic Surgery (Cardiothoracic Vascular Surgery)

## 2010-06-11 ENCOUNTER — Encounter (HOSPITAL_COMMUNITY)
Admission: RE | Admit: 2010-06-11 | Discharge: 2010-06-11 | Disposition: A | Discharge status: Home / Self Care | Payer: Medicare HMO | Source: Ambulatory Visit | Attending: Thoracic Surgery (Cardiothoracic Vascular Surgery) | Admitting: Thoracic Surgery (Cardiothoracic Vascular Surgery)

## 2010-06-11 ENCOUNTER — Ambulatory Visit (HOSPITAL_COMMUNITY)
Admission: RE | Admit: 2010-06-11 | Discharge: 2010-06-11 | Disposition: A | Discharge status: Home / Self Care | Payer: Medicare HMO | Source: Ambulatory Visit | Attending: Thoracic Surgery (Cardiothoracic Vascular Surgery) | Admitting: Thoracic Surgery (Cardiothoracic Vascular Surgery)

## 2010-06-11 DIAGNOSIS — Z01810 Encounter for preprocedural cardiovascular examination: Secondary | ICD-10-CM | POA: Insufficient documentation

## 2010-06-11 DIAGNOSIS — Z01818 Encounter for other preprocedural examination: Secondary | ICD-10-CM | POA: Insufficient documentation

## 2010-06-11 DIAGNOSIS — I059 Rheumatic mitral valve disease, unspecified: Secondary | ICD-10-CM

## 2010-06-11 DIAGNOSIS — Z01812 Encounter for preprocedural laboratory examination: Secondary | ICD-10-CM | POA: Insufficient documentation

## 2010-06-11 DIAGNOSIS — I340 Nonrheumatic mitral (valve) insufficiency: Secondary | ICD-10-CM

## 2010-06-12 ENCOUNTER — Encounter (INDEPENDENT_AMBULATORY_CARE_PROVIDER_SITE_OTHER): Payer: Medicare HMO | Admitting: Thoracic Surgery (Cardiothoracic Vascular Surgery)

## 2010-06-12 DIAGNOSIS — I059 Rheumatic mitral valve disease, unspecified: Secondary | ICD-10-CM

## 2010-06-12 NOTE — Assessment & Plan Note (Signed)
OFFICE VISIT  [name], [name] DOB:  Jan 16, 1941                                        June 12, 2010 CHART #:  [id number]  The patient returns to the office today with tentative plan to proceed with elective mitral valve repair tomorrow.  He was last seen in the office on May 07, 2010.  Since then he has remained clinically stable and he reports no episodes of chest pain nor any episodes of shortness of breath.  He is eager to proceed with his surgery.  We again reviewed the indications, risks, and potential benefits of surgery.  All of his questions have been addressed.  He stopped taking Plavix on February 24 in anticipation of his surgery.  He has been taking amiodarone prophylactically for the last week.  We plan to proceed as originally planned and all of his questions have been addressed.  Salvatore Decent. [name], M.D. Electronically Signed  CHO/MEDQ  D:  06/12/2010  T:  06/12/2010  Job:  [id number]

## 2010-06-13 ENCOUNTER — Inpatient Hospital Stay (HOSPITAL_COMMUNITY): Payer: Medicare HMO

## 2010-06-13 ENCOUNTER — Inpatient Hospital Stay (HOSPITAL_COMMUNITY)
Admission: RE | Admit: 2010-06-13 | Discharge: 2010-06-18 | DRG: 220 | Disposition: A | Discharge status: Home / Self Care | Payer: Medicare HMO | Source: Ambulatory Visit | Attending: Thoracic Surgery (Cardiothoracic Vascular Surgery) | Admitting: Thoracic Surgery (Cardiothoracic Vascular Surgery)

## 2010-06-13 ENCOUNTER — Other Ambulatory Visit: Payer: Self-pay | Admitting: Thoracic Surgery (Cardiothoracic Vascular Surgery)

## 2010-06-13 DIAGNOSIS — R05 Cough: Secondary | ICD-10-CM | POA: Diagnosis present

## 2010-06-13 DIAGNOSIS — M810 Age-related osteoporosis without current pathological fracture: Secondary | ICD-10-CM | POA: Diagnosis present

## 2010-06-13 DIAGNOSIS — K219 Gastro-esophageal reflux disease without esophagitis: Secondary | ICD-10-CM | POA: Diagnosis present

## 2010-06-13 DIAGNOSIS — E8779 Other fluid overload: Secondary | ICD-10-CM | POA: Diagnosis not present

## 2010-06-13 DIAGNOSIS — I059 Rheumatic mitral valve disease, unspecified: Principal | ICD-10-CM | POA: Diagnosis present

## 2010-06-13 DIAGNOSIS — Z9861 Coronary angioplasty status: Secondary | ICD-10-CM

## 2010-06-13 DIAGNOSIS — I10 Essential (primary) hypertension: Secondary | ICD-10-CM | POA: Diagnosis present

## 2010-06-13 DIAGNOSIS — I2510 Atherosclerotic heart disease of native coronary artery without angina pectoris: Secondary | ICD-10-CM | POA: Diagnosis present

## 2010-06-13 DIAGNOSIS — R059 Cough, unspecified: Secondary | ICD-10-CM | POA: Diagnosis present

## 2010-06-13 DIAGNOSIS — E162 Hypoglycemia, unspecified: Secondary | ICD-10-CM | POA: Diagnosis not present

## 2010-06-13 DIAGNOSIS — D62 Acute posthemorrhagic anemia: Secondary | ICD-10-CM | POA: Diagnosis not present

## 2010-06-13 DIAGNOSIS — Z7982 Long term (current) use of aspirin: Secondary | ICD-10-CM

## 2010-06-13 HISTORY — PX: MITRAL VALVE REPAIR: SHX2039

## 2010-06-13 LAB — CBC
HCT: 26.7 % — ABNORMAL LOW (ref 39.0–52.0)
HCT: 26.5 % — ABNORMAL LOW (ref 39.0–52.0)
Hemoglobin: 9.2 g/dL — ABNORMAL LOW (ref 13.0–17.0)
Hemoglobin: 9.1 g/dL — ABNORMAL LOW (ref 13.0–17.0)
MCH: 31.3 pg (ref 26.0–34.0)
MCH: 31.1 pg (ref 26.0–34.0)
MCHC: 34.5 g/dL (ref 30.0–36.0)
MCHC: 34.3 g/dL (ref 30.0–36.0)
MCV: 90.8 fL (ref 78.0–100.0)
MCV: 90.4 fL (ref 78.0–100.0)
Platelets: 85 10*3/uL — ABNORMAL LOW (ref 150–400)
Platelets: 116 10*3/uL — ABNORMAL LOW (ref 150–400)
RBC: 2.94 MIL/uL — ABNORMAL LOW (ref 4.22–5.81)
RBC: 2.93 MIL/uL — ABNORMAL LOW (ref 4.22–5.81)
RDW: 13.2 % (ref 11.5–15.5)
RDW: 13.4 % (ref 11.5–15.5)
WBC: 9 10*3/uL (ref 4.0–10.5)
WBC: 15.9 10*3/uL — ABNORMAL HIGH (ref 4.0–10.5)

## 2010-06-13 LAB — POCT I-STAT 4, (NA,K, GLUC, HGB,HCT)
Glucose, Bld: 114 mg/dL — ABNORMAL HIGH (ref 70–99)
Glucose, Bld: 126 mg/dL — ABNORMAL HIGH (ref 70–99)
Glucose, Bld: 98 mg/dL (ref 70–99)
Glucose, Bld: 120 mg/dL — ABNORMAL HIGH (ref 70–99)
Glucose, Bld: 132 mg/dL — ABNORMAL HIGH (ref 70–99)
Glucose, Bld: 112 mg/dL — ABNORMAL HIGH (ref 70–99)
Glucose, Bld: 63 mg/dL — ABNORMAL LOW (ref 70–99)
HCT: 40 % (ref 39.0–52.0)
HCT: 26 % — ABNORMAL LOW (ref 39.0–52.0)
HCT: 35 % — ABNORMAL LOW (ref 39.0–52.0)
HCT: 26 % — ABNORMAL LOW (ref 39.0–52.0)
HCT: 26 % — ABNORMAL LOW (ref 39.0–52.0)
HCT: 25 % — ABNORMAL LOW (ref 39.0–52.0)
HCT: 27 % — ABNORMAL LOW (ref 39.0–52.0)
Hemoglobin: 13.6 g/dL (ref 13.0–17.0)
Hemoglobin: 11.9 g/dL — ABNORMAL LOW (ref 13.0–17.0)
Hemoglobin: 8.8 g/dL — ABNORMAL LOW (ref 13.0–17.0)
Hemoglobin: 8.8 g/dL — ABNORMAL LOW (ref 13.0–17.0)
Hemoglobin: 8.8 g/dL — ABNORMAL LOW (ref 13.0–17.0)
Hemoglobin: 8.5 g/dL — ABNORMAL LOW (ref 13.0–17.0)
Hemoglobin: 9.2 g/dL — ABNORMAL LOW (ref 13.0–17.0)
Potassium: 4.3 mEq/L (ref 3.5–5.1)
Potassium: 4.1 mEq/L (ref 3.5–5.1)
Potassium: 4.2 mEq/L (ref 3.5–5.1)
Potassium: 4.9 mEq/L (ref 3.5–5.1)
Potassium: 4.9 mEq/L (ref 3.5–5.1)
Potassium: 4.8 mEq/L (ref 3.5–5.1)
Potassium: 4 mEq/L (ref 3.5–5.1)
Sodium: 140 mEq/L (ref 135–145)
Sodium: 134 mEq/L — ABNORMAL LOW (ref 135–145)
Sodium: 138 mEq/L (ref 135–145)
Sodium: 132 mEq/L — ABNORMAL LOW (ref 135–145)
Sodium: 133 mEq/L — ABNORMAL LOW (ref 135–145)
Sodium: 136 mEq/L (ref 135–145)
Sodium: 139 mEq/L (ref 135–145)

## 2010-06-13 LAB — POCT I-STAT, CHEM 8
BUN: 18 mg/dL (ref 6–23)
Calcium, Ion: 1.15 mmol/L (ref 1.12–1.32)
Chloride: 107 mEq/L (ref 96–112)
Creatinine, Ser: 1.2 mg/dL (ref 0.4–1.5)
Glucose, Bld: 153 mg/dL — ABNORMAL HIGH (ref 70–99)
HCT: 27 % — ABNORMAL LOW (ref 39.0–52.0)
Hemoglobin: 9.2 g/dL — ABNORMAL LOW (ref 13.0–17.0)
Potassium: 4.1 mEq/L (ref 3.5–5.1)
Sodium: 140 mEq/L (ref 135–145)
TCO2: 21 mmol/L (ref 0–100)

## 2010-06-13 LAB — POCT I-STAT 3, ART BLOOD GAS (G3+)
Acid-Base Excess: 2 mmol/L (ref 0.0–2.0)
Acid-Base Excess: 1 mmol/L (ref 0.0–2.0)
Acid-base deficit: 3 mmol/L — ABNORMAL HIGH (ref 0.0–2.0)
Acid-base deficit: 2 mmol/L (ref 0.0–2.0)
Acid-base deficit: 4 mmol/L — ABNORMAL HIGH (ref 0.0–2.0)
Bicarbonate: 29.6 mEq/L — ABNORMAL HIGH (ref 20.0–24.0)
Bicarbonate: 24.6 mEq/L — ABNORMAL HIGH (ref 20.0–24.0)
Bicarbonate: 22.7 mEq/L (ref 20.0–24.0)
Bicarbonate: 22.6 mEq/L (ref 20.0–24.0)
Bicarbonate: 20.9 mEq/L (ref 20.0–24.0)
O2 Saturation: 100 %
O2 Saturation: 100 %
O2 Saturation: 99 %
O2 Saturation: 99 %
O2 Saturation: 99 %
Patient temperature: 34.9
Patient temperature: 36.9
TCO2: 31 mmol/L (ref 0–100)
TCO2: 26 mmol/L (ref 0–100)
TCO2: 24 mmol/L (ref 0–100)
TCO2: 24 mmol/L (ref 0–100)
TCO2: 22 mmol/L (ref 0–100)
pCO2 arterial: 59.1 mmHg (ref 35.0–45.0)
pCO2 arterial: 34.9 mmHg — ABNORMAL LOW (ref 35.0–45.0)
pCO2 arterial: 43.9 mmHg (ref 35.0–45.0)
pCO2 arterial: 33.9 mmHg — ABNORMAL LOW (ref 35.0–45.0)
pCO2 arterial: 38.5 mmHg (ref 35.0–45.0)
pH, Arterial: 7.307 — ABNORMAL LOW (ref 7.350–7.450)
pH, Arterial: 7.457 — ABNORMAL HIGH (ref 7.350–7.450)
pH, Arterial: 7.32 — ABNORMAL LOW (ref 7.350–7.450)
pH, Arterial: 7.422 (ref 7.350–7.450)
pH, Arterial: 7.343 — ABNORMAL LOW (ref 7.350–7.450)
pO2, Arterial: 402 mmHg — ABNORMAL HIGH (ref 80.0–100.0)
pO2, Arterial: 358 mmHg — ABNORMAL HIGH (ref 80.0–100.0)
pO2, Arterial: 154 mmHg — ABNORMAL HIGH (ref 80.0–100.0)
pO2, Arterial: 108 mmHg — ABNORMAL HIGH (ref 80.0–100.0)
pO2, Arterial: 133 mmHg — ABNORMAL HIGH (ref 80.0–100.0)

## 2010-06-13 LAB — PROTIME-INR
INR: 1.68 — ABNORMAL HIGH (ref 0.00–1.49)
Prothrombin Time: 20 seconds — ABNORMAL HIGH (ref 11.6–15.2)

## 2010-06-13 LAB — PLATELET COUNT: Platelets: 147 10*3/uL — ABNORMAL LOW (ref 150–400)

## 2010-06-13 LAB — MRSA PCR SCREENING: MRSA by PCR: NEGATIVE

## 2010-06-13 LAB — HEMOGLOBIN AND HEMATOCRIT, BLOOD
HCT: 24.8 % — ABNORMAL LOW (ref 39.0–52.0)
Hemoglobin: 8.7 g/dL — ABNORMAL LOW (ref 13.0–17.0)

## 2010-06-13 LAB — MAGNESIUM: Magnesium: 2.8 mg/dL — ABNORMAL HIGH (ref 1.5–2.5)

## 2010-06-13 LAB — CREATININE, SERUM
Creatinine, Ser: 1.18 mg/dL (ref 0.4–1.5)
GFR calc Af Amer: >60 mL/min (ref >60)
GFR calc non Af Amer: >60 mL/min (ref >60)

## 2010-06-13 LAB — APTT: aPTT: 41 seconds — ABNORMAL HIGH (ref 24–37)

## 2010-06-14 ENCOUNTER — Inpatient Hospital Stay (HOSPITAL_COMMUNITY): Payer: Medicare HMO

## 2010-06-14 LAB — MAGNESIUM
Magnesium: 2.5 mg/dL (ref 1.5–2.5)
Magnesium: 2.7 mg/dL — ABNORMAL HIGH (ref 1.5–2.5)

## 2010-06-14 LAB — BASIC METABOLIC PANEL
BUN: 16 mg/dL (ref 6–23)
CO2: 24 mEq/L (ref 19–32)
Calcium: 7.7 mg/dL — ABNORMAL LOW (ref 8.4–10.5)
Chloride: 108 mEq/L (ref 96–112)
Creatinine, Ser: 1.27 mg/dL (ref 0.4–1.5)
GFR calc Af Amer: >60 mL/min (ref >60)
GFR calc non Af Amer: 56 mL/min — ABNORMAL LOW (ref >60)
Glucose, Bld: 143 mg/dL — ABNORMAL HIGH (ref 70–99)
Potassium: 4.2 mEq/L (ref 3.5–5.1)
Sodium: 137 mEq/L (ref 135–145)

## 2010-06-14 LAB — CBC
HCT: 24.4 % — ABNORMAL LOW (ref 39.0–52.0)
HCT: 26.2 % — ABNORMAL LOW (ref 39.0–52.0)
Hemoglobin: 8.5 g/dL — ABNORMAL LOW (ref 13.0–17.0)
Hemoglobin: 8.8 g/dL — ABNORMAL LOW (ref 13.0–17.0)
MCH: 31.7 pg (ref 26.0–34.0)
MCH: 30.9 pg (ref 26.0–34.0)
MCHC: 34.8 g/dL (ref 30.0–36.0)
MCHC: 33.6 g/dL (ref 30.0–36.0)
MCV: 91 fL (ref 78.0–100.0)
MCV: 91.9 fL (ref 78.0–100.0)
Platelets: 99 10*3/uL — ABNORMAL LOW (ref 150–400)
Platelets: 119 10*3/uL — ABNORMAL LOW (ref 150–400)
RBC: 2.68 MIL/uL — ABNORMAL LOW (ref 4.22–5.81)
RBC: 2.85 MIL/uL — ABNORMAL LOW (ref 4.22–5.81)
RDW: 13.7 % (ref 11.5–15.5)
RDW: 14 % (ref 11.5–15.5)
WBC: 14 10*3/uL — ABNORMAL HIGH (ref 4.0–10.5)
WBC: 16.1 10*3/uL — ABNORMAL HIGH (ref 4.0–10.5)

## 2010-06-14 LAB — CREATININE, SERUM
Creatinine, Ser: 1.51 mg/dL — ABNORMAL HIGH (ref 0.4–1.5)
GFR calc Af Amer: 56 mL/min — ABNORMAL LOW (ref >60)
GFR calc non Af Amer: 46 mL/min — ABNORMAL LOW (ref >60)

## 2010-06-14 LAB — GLUCOSE, CAPILLARY
Glucose-Capillary: 100 mg/dL — ABNORMAL HIGH (ref 70–99)
Glucose-Capillary: 114 mg/dL — ABNORMAL HIGH (ref 70–99)
Glucose-Capillary: 131 mg/dL — ABNORMAL HIGH (ref 70–99)
Glucose-Capillary: 142 mg/dL — ABNORMAL HIGH (ref 70–99)
Glucose-Capillary: 146 mg/dL — ABNORMAL HIGH (ref 70–99)
Glucose-Capillary: 79 mg/dL (ref 70–99)
Glucose-Capillary: 127 mg/dL — ABNORMAL HIGH (ref 70–99)
Glucose-Capillary: 130 mg/dL — ABNORMAL HIGH (ref 70–99)
Glucose-Capillary: 131 mg/dL — ABNORMAL HIGH (ref 70–99)

## 2010-06-14 NOTE — Op Note (Signed)
[name], [name] NO.:  [id number]  MEDICAL RECORD NO.:  [id number]           PATIENT TYPE:  I  LOCATION:  2304                         FACILITY:  [facility]  PHYSICIAN:  [name], M.D. DATE OF BIRTH:  25-Aug-1940  DATE OF PROCEDURE:  06/13/2010 DATE OF DISCHARGE:                              OPERATIVE REPORT   PREOPERATIVE DIAGNOSIS:  Mitral valve prolapse with severe mitral regurgitation.  POSTOPERATIVE DIAGNOSIS:  Mitral valve prolapse with severe mitral regurgitation.  PROCEDURE:  Right miniature thoracotomy for mitral valve repair (complex valvuloplasty including triangular resection of posterior leaflet with chordal transposition x1, artificial Gore-Tex chord replacement x2, plication of anterolateral commissure, and 28-mm Sorin Memo 3-D ring annuloplasty).  SURGEON:  Louis Decent. [name], MD  ASSISTANT:  Louis Fudge, PA  BRIEF CLINICAL NOTE:  The patient is a 70 year old male with history of hypertension and recently discovered mitral valve prolapse with mitral regurgitation.  A heart murmur was discovered on routine physical exam. Echocardiogram revealed mitral valve prolapse with severe mitral regurgitation.  Transesophageal echocardiogram confirmed the presence of flail segment of the posterior leaflet of the mitral valve.  There was normal left ventricular systolic function.  Cardiac catheterization was notable for single-vessel coronary artery disease with high-grade proximal stenosis of the right coronary artery.  The patient underwent PCI and stenting of the right coronary artery by Dr. Jacinto [name] and now presents for elective mitral valve repair.  A full consultation note has been dictated previously.  The patient and his family have been counseled at length regarding the indications, risks, and potential benefits of surgery.  Alternative treatment strategies have been discussed.  Alternative surgical approaches have been discussed  in detail and all of their questions have been addressed.  OPERATIVE FINDINGS: 1. 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 2. Normal left ventricular systolic function. 3. Moderate aortic regurgitation. 4. No residual mitral regurgitation following successful mitral valve     repair.  OPERATIVE NOTE IN DETAIL:  The patient was brought to the operating room on the above-mentioned date and central monitoring was established by the Anesthesia Team under the care and direction of Dr. Kipp [name]. Specifically, a Swan-Ganz catheter was placed through the left internal jugular approach.  A radial arterial line was placed.  Intravenous antibiotics were administered.  The patient was placed in the supine position on the operating table.  General endotracheal anesthesia was induced uneventfully.  The patient was initially intubated with a dual- lumen endotracheal tube.  A Foley catheter was placed.  The patient was positioned with a soft roll behind the right scapula and the neck gently extended and turned towards the left.  The patient's right neck, chest, abdomen, both groins, and both lower extremities were prepared and draped in a sterile manner.  Baseline transesophageal echocardiogram was performed by Dr. Noreene [name]. This confirmed the presence of severe mitral regurgitation with a flail segment of a portion of the middle scallop (P2) of the posterior leaflet.  The flail segment was somewhat eccentrically located towards the P1 segment.  There was  some prolapse of P1.  The anterior leaflet was normal.  There was moderate (2+) aortic insufficiency.  The jet of aortic insufficiency was slightly eccentric and emanated from the commissure between the non and left cusp of the aortic valve.  There was mild prolapse of the noncoronary cusp of the valve.  The jet of aortic insufficiency did not feel the  entire left ventricle and left ventricular chamber size was normal.  There was normal left ventricular systolic function.  No other significant abnormalities were noted.  A small incision was made in the right inguinal crease.  The anterior surface of the right common femoral artery and right common femoral vein were both dissected through the incision.  The femoral artery was normal in appearance.  Single lung ventilation was begun.  A 5-cm right anterolateral mini thoracotomy incision was performed beginning just above and lateral to the right nipple.  The incision was completed through the subcutaneous tissues with electrocautery.  The pectoralis major muscle was retracted medially and completely preserved.  The right pleural space was entered through the third intercostal space.  A soft tissue retractor was placed.  A pledgeted suture was placed in the dome of the right hemidiaphragm and retracted inferiorly to facilitate exposure.  Two 11- mm ports were placed through separate stab incisions inferiorly.  The right pleural space was insufflated continuously with carbon dioxide gas through the posterior port.  A longitudinal incision was made in the pericardium 3 cm anterior to the phrenic nerve.  Silk traction sutures were placed on either side to facilitate exposure.  The patient was placed in Trendelenburg position.  The right internal jugular vein was cannulated with Seldinger technique and a flexible guidewire was advanced into the right atrium.  A purse-string suture was placed in the anterior surface of the right greater saphenous bulb.  Two concentric purse-string sutures were placed on the anterior surface of the right common femoral artery.  The right common femoral vein was cannulated with a Seldinger technique through the greater saphenous bulb and a flexible guidewire was advanced using transesophageal echocardiogram up through the inferior vena cava through the  right atrium until the guidewire proceeded up the superior vena cava.  The femoral vein was dilated with serial dilators and cannulated with a 22- French long femoral venous cannula.  The femoral artery was cannulated with a Seldinger technique and a flexible guidewire was advanced until it could be appreciated intraluminally in the descending thoracic aorta using transesophageal echocardiogram.  The femoral artery was dilated with serial dilators and cannulated with a 16-French femoral arterial cannula.  The right internal jugular vein was now dilated with serial dilators and cannulated with a 14-French pediatric femoral venous cannula.  Cardiopulmonary bypass was begun.  Vacuum assist venous drainage was utilized.  Venous drainage and exposure was excellent.  The incision in the pericardium was extended in both directions.  A retrograde cardioplegic cannula was placed directly through the right atrium into the coronary sinus using transesophageal echocardiogram for guidance. An antegrade cardioplegic cannula was placed in the proximal anterolateral surface of the ascending thoracic aorta.  The patient was cooled to 28 degrees systemic temperature.  The aortic crossclamp was applied and cold blood cardioplegia was delivered initially in an antegrade fashion through the aortic root.  Supplemental cardioplegia was administered retrograde through the coronary sinus catheter.  The initial cardioplegic arrest was rapid with early diastolic arrest. Repeat doses of cardioplegia were administered every 20-30 minutes throughout the entire crossclamp portion of  the operation to maintain completely flat electrocardiogram.  Left ventricular myocardial protection was felt to be excellent.  A left atriotomy incision was performed posteriorly through the interatrial groove and continue partway across the back wall of the left atrium after opening the oblique sinus inferiorly.  A retractor blade was  placed into the left atrium and attached to a separate sidearm placed through a small stab incision just to the right side of the sternum through the third intercostal space.  Exposure of the floor of the left atrium in the mitral valve was excellent.  The mitral valve was carefully examined.  There was fibroelastic deficiency-type degenerative disease.  A portion of the P2 segment of the posterior leaflet eccentrically located towards the anterolateral commissure was flail with multiple ruptured primary chords.  There was some prolapse without flail of P1.  The remainder of the posterior leaflet was normal.  The anterior leaflet was normal.  The remainder of the subvalvular apparatus was normal.  No other abnormalities were noted.  Interrupted 2-0 Ethibond horizontal mattress sutures were placed circumferentially around the entire mitral valve annulus.  These sutures would ultimately be utilized for ring annuloplasty, and at this juncture they were utilized to suspend the valve symmetrically.  The flail segment of P2 was now repaired using a triangular resection.  After resection of a triangular segment of P2 that was flail, a secondary chord from the undersurface of P2 that was normal length was mobilized to be utilized for chordal translocation to provide adequate support for P1 and P2 after the repair.  Two additional Gore-Tex sutures were placed for further support.  A pledgeted CV4 Gore-Tex suture was placed through the head of the anterolateral papillary muscle and the suture was tied. The 2 strands of the Gore-Tex suture were now gathered in an organized fashion and placed into the left ventricular chamber for later retrieval.  The remaining vertical defect in the posterior leaflet was closed using interrupted CV5 everting simple Gore-Tex sutures.  The valve was now tested with saline and already appeared competent.  The mobilized secondary chord from the undersurface of the  posterior leaflet was now transposed to the free margin of the posterior leaflet at the juncture of the repaired vertical defect.  This was inserted in place with CV5 Gore-Tex suture.  There remained some residual billowing of the anterolateral commissure and P1.  This was corrected with 2 everting CV5 Gore-Tex simple Magic sutures placed at the commissure.  The valve was sized to accept a 28-mm annuloplasty ring.  The valve ring was slightly larger than the overall dimensions of the anterior leaflet. A Sorin Memo 3-D annuloplasty ring (size 28 mm, catalog number [id number], serial number [id number]) was secured in place uneventfully.  The valve was now tested with saline and appeared to be perfectly competent without any residual prolapse, leak, or billowing.  There was a broad symmetrical line of coaptation of the anterior posterior leaflet.  The 2 CV4 Gore-Tex artificial chords were now retrieved from the left ventricular chamber.  Each limb of the Gore-Tex chord was now placed from the ventricular to the atrial surface of the posterior leaflet on either side of the repaired vertical defect.  Each cord was then woven through the posterior leaflet towards the posterior annulus.  The left ventricle was filled with saline and the Gore-Tex suture was tied to an appropriate length.  The valve was again tested with saline and remained perfectly competent.  The blue ink test was performed demonstrating  a broad symmetrical line of coaptation of the posterior leaflet. Rewarming was begun.  A sump drain was placed across the mitral valve to serve as a left ventricular vent.  The left atriotomy incision was closed posteriorly using a 2-layer closure of running 3-0 Prolene suture.  One final dose of warm retrograde hot shot cardioplegia was administered.  The lungs were ventilated and the heart allowed to fill after which time the aortic crossclamp was removed.  Total crossclamp time was 127  minutes. The retrograde cardioplegic cannula was removed.  Epicardial pacing wire was fixed to the undersurface of the right ventricular free wall into the right atrial appendage.  The patient was rewarmed to 37 degrees centigrade temperature.  The lungs were ventilated and the heart allowed to fill after which time the left ventricular vent was removed.  The lungs were again ventilated and heart allowed to fill while transesophageal echocardiogram was briefly evaluated.  The valve repair appeared to be perfectly intact.  The antegrade cardioplegic cannula was now removed.  The patient was weaned from cardiopulmonary bypass without difficulty. The patient's rhythm at separation from bypass was sinus bradycardia. Atrial pacing was employed.  No inotropic support was required.  Total cardiopulmonary bypass time for the operation was 183 minutes.  Followup transesophageal echocardiogram was performed by Dr. Noreene [name] after separation from bypass.  This demonstrated normal functioning mitral valve with a well-seated annuloplasty ring in the mitral position. There was no residual mitral regurgitation, whatsoever.  There remained moderate aortic insufficiency.  Left ventricular function was normal. No other abnormalities were noted.  Protamine was administered to reverse the anticoagulation.  The femoral venous and arterial cannulae were both removed uneventfully.  There was a palpable pulse in the distal right femoral artery.  The right internal jugular cannula was removed and manual pressure was held on the neck for 30 minutes.  Single lung ventilation was begun.  The left atriotomy incision was inspected for hemostasis.  The pericardial space was drained with a 28-French Bard chest tube placed through the anterior port incision.  The pericardium was reapproximated laterally using a patch of CorMatrix soft tissue patch.  The right pleural space was irrigated with saline solution and inspected  for hemostasis.  The On-Q continuous pain management system was utilized to facilitate postoperative pain control.  One 5-inch catheter applied with the On-Q kit was tunneled through the subcutaneous tissues to the posterior port incision and then tunneled through the intercostal space into the subpleural space posteriorly to cover the second through the sixth intercostal nerve roots.  The catheter was flushed with 5 mL of 0.5% bupivacaine solution and ultimately connected to continuous infusion pump.  The right pleural space was drained with a 28-French Bard chest tube placed through the both posterior port incision.  The mini thoracotomy incision was closed in multiple layers.  The right groin incision was inspected for hemostasis and closed in multiple layers in a routine fashion.  All skin incisions were closed with subcuticular skin closures.  The patient tolerated the procedure well.  Both chest tubes were fixed to closed suction drainage device.  The patient was reintubated with a single-lumen endotracheal tube and transported directly to the Surgical Intensive Care Unit in stable condition.  There were no intraoperative complications.  All sponge, instrument, and needle counts were verified correct at completion of the operation.  No blood products were administered.     [name], M.D.     CHO/MEDQ  D:  06/13/2010  T:  06/14/2010  Job:  [id number]  cc:   Cristy Hilts. Louis Halim, MD Kalman Shan, MD Wilfrid Lund, MD Pramod P. Pearlean Brownie, MD Massie Maroon, MD  Electronically Signed by Tressie Stalker M.D. on 06/14/2010 21:30:86 AM

## 2010-06-15 ENCOUNTER — Inpatient Hospital Stay (HOSPITAL_COMMUNITY): Payer: Medicare HMO

## 2010-06-15 LAB — BASIC METABOLIC PANEL
BUN: 22 mg/dL (ref 6–23)
CO2: 25 mEq/L (ref 19–32)
Calcium: 7.7 mg/dL — ABNORMAL LOW (ref 8.4–10.5)
Chloride: 101 mEq/L (ref 96–112)
Creatinine, Ser: 1.26 mg/dL (ref 0.4–1.5)
GFR calc Af Amer: >60 mL/min (ref >60)
GFR calc non Af Amer: 57 mL/min — ABNORMAL LOW (ref >60)
Glucose, Bld: 125 mg/dL — ABNORMAL HIGH (ref 70–99)
Potassium: 3.6 mEq/L (ref 3.5–5.1)
Sodium: 133 mEq/L — ABNORMAL LOW (ref 135–145)

## 2010-06-15 LAB — CBC
HCT: 24.9 % — ABNORMAL LOW (ref 39.0–52.0)
Hemoglobin: 8.5 g/dL — ABNORMAL LOW (ref 13.0–17.0)
MCH: 31.5 pg (ref 26.0–34.0)
MCHC: 34.1 g/dL (ref 30.0–36.0)
MCV: 92.2 fL (ref 78.0–100.0)
Platelets: 105 10*3/uL — ABNORMAL LOW (ref 150–400)
RBC: 2.7 MIL/uL — ABNORMAL LOW (ref 4.22–5.81)
RDW: 14.3 % (ref 11.5–15.5)
WBC: 13.3 10*3/uL — ABNORMAL HIGH (ref 4.0–10.5)

## 2010-06-16 ENCOUNTER — Inpatient Hospital Stay (HOSPITAL_COMMUNITY): Payer: Medicare HMO

## 2010-06-17 ENCOUNTER — Inpatient Hospital Stay (HOSPITAL_COMMUNITY): Payer: Medicare HMO

## 2010-06-21 NOTE — Discharge Summary (Signed)
[name], [name] NO.:  [id number]  MEDICAL RECORD NO.:  [id number]           PATIENT TYPE:  I  LOCATION:  2017                         FACILITY:  [facility]  PHYSICIAN:  Salvatore Decent. [name], M.D. DATE OF BIRTH:  November 28, 1940  DATE OF ADMISSION:  06/13/2010 DATE OF DISCHARGE:                              DISCHARGE SUMMARY   FINAL DIAGNOSIS:  Mitral valve prolapse with severe mitral regurgitation.  IN-HOSPITAL DIAGNOSES: 1. Acute blood loss anemia postoperatively. 2. Volume overload postoperatively.  SECONDARY DIAGNOSES: 1. Single-vessel coronary artery disease. 2. Hypertension. 3. Gastroesophageal reflux disease. 4. Osteoporosis. 5. Acute reversible ischemic neurologic deficit following cardiac     catheterization in November 2011. 6. Persistent cough. 7. Status post appendectomy.  This improved. 8. Status post percutaneous transluminal coronary angioplasty and     stenting of the proximal mid right coronary artery by intravascular     ultrasound guidance done by Dr. Jacinto Halim on April 30, 2010.  IN-HOSPITAL OPERATIONS AND PROCEDURES: 1. Right miniature thoracotomy for mitral valve repair of complex     valvuloplasty including triangular resection of posterior leaflet     with chordal transposition x1, artificial Gore-Tex cord replacement     x2, plication of anterolateral commissure, 28-mm Sorin Memo 3D ring     annuloplasty, this was done by Dr. [name] on June 13, 2010. 2. Intraoperative transesophageal echocardiogram.  HISTORY AND PHYSICAL AND HOSPITAL COURSE:  The patient is a 70 year old male with history of hypertension and recently discovered mitral valve prolapse with mitral regurgitation.  Heart murmur was discovered on routine physical exam.  Echocardiogram revealed mitral valve prolapse with severe mitral regurgitation.  Transesophageal echocardiogram confirmed the presence of flail segment of the posterior leaflet of the mitral valve.  There  was normal left ventricular systolic function. Cardiac catheterization was notable for single vessel coronary artery disease with high-grade proximal stenosis of the right coronary artery. The patient underwent PCI and stenting of the right coronary artery by Dr. Jacinto Halim now presents for elective mitral valve repair.  He was seen and evaluated by Dr. [name].  Dr. [name] discussed with the patient undergoing mini mitral valve repair.  He discussed risks and benefits with the patient.  The patient acknowledged understanding and agreed to proceed.  Surgery was scheduled for June 13, 2010.  For details of the patient's past medical history and physical exam, please see dictated H and P.  The patient was taken to the operating room on June 13, 2010 where he underwent right miniature thoracotomy for mitral valve repair with complex valvuloplasty including triangular resection of posterior leaflet with chordal transposition x1, artificial Gore-Tex cord replacement x2, plication of the anterolateral commissure and 28-mmSorin Memo 3D ring annuloplasty.  The patient tolerated this procedure well and was transferred to surgical intensive care unit in stable condition.  Postoperatively, the patient was noted to be hemodynamically stable.  He was extubated on the evening of surgery.  Postextubation, the patient noted to be alert and oriented x4.  Neuro intact.  On postop day #1, the patient was noted to be in normal sinus rhythm, left at A paced 80.  Blood pressure was  noted to be stable and he was off all drips.  The patient was started back on his p.o. amiodarone.  He was also started on Plavix for his recent stent placement.  Due to the patient being on Plavix, he was not started on Coumadin.  Plan to continue Plavix and low-dose aspirin.  A chest x-ray obtained on postop day #1 was stable.  The patient's chest tubes remained in place to monitor output.  The patient was encouraged to use his  incentive spirometer.  He was off oxygen, postop day #1 with O2 sats maintaining greater than 98%.  He did have some mild acute blood loss anemia and this was monitored.  He was started on diuretics for volume overload. At this time, the patient was ready for transfer out to the [facility].  Bed was not available on postop day #1.  The patient remained in the intensive care unit.  He continued to progress well.  He is up ambulating well with nurses.  Pacer was turned off and he is maintaining sinus rhythm in the 60s to 70s.  Blood pressure was well controlled. All incisions were noted to be clean, dry and intact and healing well. Hemoglobin and hematocrit remained stable.  He was tolerating diet well. No nausea or vomiting noted.  He was transferred to the telemetry floor on postop day #2 in stable condition.  I will plan to continue to monitor the patient over the next 48 hours.  Chest tubes currently are in place and if drainage decreased by a.m., we well discontinue the pleural chest tubes.  Plan to continue ambulating the patient 3-4 times per day.  We will continue monitor rhythm and blood pressure.  The patient continued to progress well.  He is tentatively ready for discharge to home in 48 hours.  The patient's most recent lab work shows sodium of 133, potassium of 3.6, chloride of 101, bicarbonate 25, BUN of 22, creatinine 1.26, glucose of 125.  White blood cell count 13.3, hemoglobin is 8.5, hematocrit 24.9, platelet count 105.  FOLLOWUP APPOINTMENT:  A followup appointment has been arranged with Dr. [name] the evening for June 25, 2010 at 2:15 p.m.  The patient will need to obtain PA and lateral chest x-ray 30 minutes prior to this appointment.  He will need to follow up Dr. Jacinto Halim in 2 weeks.  He need to contact his office to make these arrangements.  ACTIVITY:  The patient is instructed no driving until released to do so, no lifting over 10 pounds.  He is told to ambulate 3-4 times  per day, progress as tolerated, and continue his breathing exercises.  DIET:  The patient is educated on diet to be low-fat, low-salt.  INCISIONAL CARE:  The patient is told to shower, washing his incisions using soap and water.  He is to contact the office if he develops any drainage or opening from any of his incision sites.  DISCHARGE MEDICATIONS: 1. Lasix 40 mg daily. 2. Oxycodone 5 mg one to two tablets q.3 h. p.r.n. pain. 3. Potassium chloride 20 mEq daily. 4. Crestor 20 mg at night. 5. Amiodarone 200 mg b.i.d. 6. Aspirin enteric-coated 81 mg daily. 7. Boniva injection one ejection subcu every 3 months. 8. Calcium citrate/vitamin D two tablets daily. 9. Plavix 75 mg daily. 10.Pepcid 20 mg daily. 11.Zyrtec 10 mg daily.     Sol Blazing, PA   ______________________________ Salvatore Decent. [name], M.D.    KMD/MEDQ  D:  06/15/2010  T:  06/16/2010  Job:  [id number]  cc:   Cristy Hilts. Jacinto Halim, MD  Electronically Signed by Cameron Proud PA on 06/19/2010 12:42:36 PM Electronically Signed by Tressie Stalker M.D. on 06/20/2010 03:36:59 PM

## 2010-06-21 NOTE — Discharge Summary (Signed)
  [name], [name] NO.:  [id number]  MEDICAL RECORD NO.:  [id number]          PATIENT TYPE:  LOCATION:                                 FACILITY:  PHYSICIAN:  Salvatore Decent. [name], M.D. DATE OF BIRTH:  Dec 31, 1940  DATE OF ADMISSION: DATE OF DISCHARGE:                              DISCHARGE SUMMARY   ADDENDUM:  This is an addendum to patient's dictated discharge summary.  Patient was dictated in anticipation of discharge to home on Sunday, June 17, 2010.  Over the weekend the patient had some bradycardia with heart rate in the 50s.  His external pacing wires were kept in.  Currently the patient has maintained normal sinus rhythm in the 60s.  We will plan to discontinue external pacing wires in the a.m. if heart remained stable.  His chest tubes were able to be discontinued postop day #3.  He had minimal drainage prior to discontinue.  Followup chest x-ray remained stable.  He has remained afebrile.  He remained off oxygen with O2 saturations maintaining greater than 90% on room air.  All incisions were clean, dry, intact, and healing well.  He continues to ambulate without difficulty.  He is tolerating diet well.  No nausea or vomiting noted.  The patient is tentatively ready for discharge to home in the a.m. postop day 5, June 18, 2010, depending he remained stable.  Please see dictated discharge summary for followup appointments and discharge instructions.  Discharge medications as dictated with the change of Crestor 10 mg at night.  This was his home dose and it was put in the system.  The patient will go home on Crestor 10 mg at night.     Sol Blazing, PA   ______________________________ Salvatore Decent. [name], M.D.    KMD/MEDQ  D:  06/17/2010  T:  06/17/2010  Job:  [id number]  cc:   Cristy Hilts. Jacinto Halim, MD  Electronically Signed by Cameron Proud PA on 06/19/2010 12:44:13 PM Electronically Signed by Tressie Stalker M.D. on 06/20/2010 03:37:02 PM

## 2010-06-22 ENCOUNTER — Other Ambulatory Visit: Payer: Self-pay | Admitting: Thoracic Surgery (Cardiothoracic Vascular Surgery)

## 2010-06-22 DIAGNOSIS — I059 Rheumatic mitral valve disease, unspecified: Secondary | ICD-10-CM

## 2010-06-25 ENCOUNTER — Encounter (INDEPENDENT_AMBULATORY_CARE_PROVIDER_SITE_OTHER): Payer: Self-pay | Admitting: Thoracic Surgery (Cardiothoracic Vascular Surgery)

## 2010-06-25 ENCOUNTER — Ambulatory Visit
Admission: RE | Admit: 2010-06-25 | Discharge: 2010-06-25 | Disposition: A | Discharge status: Home / Self Care | Payer: Medicare HMO | Source: Ambulatory Visit | Attending: Thoracic Surgery (Cardiothoracic Vascular Surgery) | Admitting: Thoracic Surgery (Cardiothoracic Vascular Surgery)

## 2010-06-25 DIAGNOSIS — I059 Rheumatic mitral valve disease, unspecified: Secondary | ICD-10-CM

## 2010-06-26 LAB — BLOOD GAS, ARTERIAL
Acid-Base Excess: 2.8 mmol/L — ABNORMAL HIGH (ref 0.0–2.0)
Bicarbonate: 27 mEq/L — ABNORMAL HIGH (ref 20.0–24.0)
Drawn by: 206361
FIO2: 0.21 %
O2 Saturation: 96.1 %
Patient temperature: 98.6
TCO2: 28.3 mmol/L (ref 0–100)
pCO2 arterial: 43 mmHg (ref 35.0–45.0)
pH, Arterial: 7.414 (ref 7.350–7.450)
pO2, Arterial: 76.1 mmHg — ABNORMAL LOW (ref 80.0–100.0)

## 2010-06-26 LAB — URINALYSIS, ROUTINE W REFLEX MICROSCOPIC
Bilirubin Urine: NEGATIVE
Bilirubin Urine: NEGATIVE
Glucose, UA: NEGATIVE mg/dL
Glucose, UA: NEGATIVE mg/dL
Hgb urine dipstick: NEGATIVE
Hgb urine dipstick: NEGATIVE
Ketones, ur: NEGATIVE mg/dL
Ketones, ur: NEGATIVE mg/dL
Nitrite: NEGATIVE
Nitrite: NEGATIVE
Protein, ur: NEGATIVE mg/dL
Protein, ur: NEGATIVE mg/dL
Specific Gravity, Urine: 1.012 (ref 1.005–1.030)
Specific Gravity, Urine: 1.009 (ref 1.005–1.030)
Urobilinogen, UA: 0.2 mg/dL (ref 0.0–1.0)
Urobilinogen, UA: 0.2 mg/dL (ref 0.0–1.0)
pH: 7 (ref 5.0–8.0)
pH: 6.5 (ref 5.0–8.0)

## 2010-06-26 LAB — CBC
HCT: 40.8 % (ref 39.0–52.0)
HCT: 39.7 % (ref 39.0–52.0)
Hemoglobin: 14.2 g/dL (ref 13.0–17.0)
Hemoglobin: 13.6 g/dL (ref 13.0–17.0)
MCH: 31.3 pg (ref 26.0–34.0)
MCH: 31.6 pg (ref 26.0–34.0)
MCHC: 34.8 g/dL (ref 30.0–36.0)
MCHC: 34.3 g/dL (ref 30.0–36.0)
MCV: 89.9 fL (ref 78.0–100.0)
MCV: 92.1 fL (ref 78.0–100.0)
Platelets: 208 10*3/uL (ref 150–400)
Platelets: 189 10*3/uL (ref 150–400)
RBC: 4.54 MIL/uL (ref 4.22–5.81)
RBC: 4.31 MIL/uL (ref 4.22–5.81)
RDW: 13.2 % (ref 11.5–15.5)
RDW: 13.4 % (ref 11.5–15.5)
WBC: 9.4 10*3/uL (ref 4.0–10.5)
WBC: 9 10*3/uL (ref 4.0–10.5)

## 2010-06-26 LAB — POCT I-STAT, CHEM 8
BUN: 16 mg/dL (ref 6–23)
Calcium, Ion: 1.2 mmol/L (ref 1.12–1.32)
Chloride: 104 mEq/L (ref 96–112)
Creatinine, Ser: 0.9 mg/dL (ref 0.4–1.5)
Glucose, Bld: 92 mg/dL (ref 70–99)
HCT: 43 % (ref 39.0–52.0)
Hemoglobin: 14.6 g/dL (ref 13.0–17.0)
Potassium: 4.2 mEq/L (ref 3.5–5.1)
Sodium: 139 mEq/L (ref 135–145)
TCO2: 27 mmol/L (ref 0–100)

## 2010-06-26 LAB — COMPREHENSIVE METABOLIC PANEL
ALT: 24 U/L (ref 0–53)
ALT: 18 U/L (ref 0–53)
AST: 28 U/L (ref 0–37)
AST: 22 U/L (ref 0–37)
Albumin: 4.1 g/dL (ref 3.5–5.2)
Albumin: 3.7 g/dL (ref 3.5–5.2)
Alkaline Phosphatase: 73 U/L (ref 39–117)
Alkaline Phosphatase: 64 U/L (ref 39–117)
BUN: 18 mg/dL (ref 6–23)
BUN: 14 mg/dL (ref 6–23)
CO2: 24 mEq/L (ref 19–32)
CO2: 28 mEq/L (ref 19–32)
Calcium: 10.4 mg/dL (ref 8.4–10.5)
Calcium: 9 mg/dL (ref 8.4–10.5)
Chloride: 106 mEq/L (ref 96–112)
Chloride: 106 mEq/L (ref 96–112)
Creatinine, Ser: 1.07 mg/dL (ref 0.4–1.5)
Creatinine, Ser: 0.84 mg/dL (ref 0.4–1.5)
GFR calc Af Amer: >60 mL/min (ref >60)
GFR calc Af Amer: >60 mL/min (ref >60)
GFR calc non Af Amer: >60 mL/min (ref >60)
GFR calc non Af Amer: >60 mL/min (ref >60)
Glucose, Bld: 105 mg/dL — ABNORMAL HIGH (ref 70–99)
Glucose, Bld: 99 mg/dL (ref 70–99)
Potassium: 4.2 mEq/L (ref 3.5–5.1)
Potassium: 4.2 mEq/L (ref 3.5–5.1)
Sodium: 137 mEq/L (ref 135–145)
Sodium: 137 mEq/L (ref 135–145)
Total Bilirubin: 1 mg/dL (ref 0.3–1.2)
Total Bilirubin: 0.5 mg/dL (ref 0.3–1.2)
Total Protein: 7.1 g/dL (ref 6.0–8.3)
Total Protein: 6.6 g/dL (ref 6.0–8.3)

## 2010-06-26 LAB — POCT I-STAT 3, VENOUS BLOOD GAS (G3P V)
Acid-base deficit: 1 mmol/L (ref 0.0–2.0)
Bicarbonate: 26 mEq/L — ABNORMAL HIGH (ref 20.0–24.0)
O2 Saturation: 58 %
TCO2: 28 mmol/L (ref 0–100)
pCO2, Ven: 53 mmHg — ABNORMAL HIGH (ref 45.0–50.0)
pH, Ven: 7.299 (ref 7.250–7.300)
pO2, Ven: 34 mmHg (ref 30.0–45.0)

## 2010-06-26 LAB — LIPID PANEL
Cholesterol: 174 mg/dL (ref 0–200)
HDL: 36 mg/dL — ABNORMAL LOW (ref >39)
LDL Cholesterol: 105 mg/dL — ABNORMAL HIGH (ref 0–99)
Total CHOL/HDL Ratio: 4.8 RATIO
Triglycerides: 166 mg/dL — ABNORMAL HIGH (ref <150)
VLDL: 33 mg/dL (ref 0–40)

## 2010-06-26 LAB — POCT I-STAT 3, ART BLOOD GAS (G3+)
Acid-Base Excess: 2 mmol/L (ref 0.0–2.0)
Bicarbonate: 28.1 mEq/L — ABNORMAL HIGH (ref 20.0–24.0)
O2 Saturation: 95 %
TCO2: 30 mmol/L (ref 0–100)
pCO2 arterial: 50.2 mmHg — ABNORMAL HIGH (ref 35.0–45.0)
pH, Arterial: 7.355 (ref 7.350–7.450)
pO2, Arterial: 79 mmHg — ABNORMAL LOW (ref 80.0–100.0)

## 2010-06-26 LAB — TYPE AND SCREEN
ABO/RH(D): O POS
Antibody Screen: NEGATIVE

## 2010-06-26 LAB — ABO/RH: ABO/RH(D): O POS

## 2010-06-26 LAB — TROPONIN I: Troponin I: 0.01 ng/mL (ref 0.00–0.06)

## 2010-06-26 LAB — PROTIME-INR
INR: 1.05 (ref 0.00–1.49)
INR: 1.04 (ref 0.00–1.49)
Prothrombin Time: 13.9 seconds (ref 11.6–15.2)
Prothrombin Time: 13.8 seconds (ref 11.6–15.2)

## 2010-06-26 LAB — HEMOGLOBIN A1C
Hgb A1c MFr Bld: 5.1 % (ref <5.7)
Hgb A1c MFr Bld: 4.9 % (ref <5.7)
Mean Plasma Glucose: 100 mg/dL (ref <117)
Mean Plasma Glucose: 94 mg/dL (ref <117)

## 2010-06-26 LAB — CK TOTAL AND CKMB (NOT AT ARMC)
CK, MB: 1.6 ng/mL (ref 0.3–4.0)
Relative Index: INVALID (ref 0.0–2.5)
Total CK: 66 U/L (ref 7–232)

## 2010-06-26 LAB — APTT
aPTT: 33 seconds (ref 24–37)
aPTT: 33 seconds (ref 24–37)

## 2010-06-26 LAB — SURGICAL PCR SCREEN
MRSA, PCR: NEGATIVE
Staphylococcus aureus: NEGATIVE

## 2010-06-26 NOTE — Assessment & Plan Note (Signed)
OFFICE VISIT  Louis, [name] DOB:  17-Mar-1941                                        June 25, 2010 CHART #:  [id number]  The patient comes in today for a 1 week postoperative followup.  He is status post right minithoracotomy for mitral valve repair on June 13, 2010.  His postoperative course was generally uneventful.  He was able to be discharged home on June 17, 2010, in good condition.  Since his discharge home, he has had some problems with oxycodone.  He states that every time he takes the pain medication, he has itching and initially had slight rash over shoulders and down his arms.  He spoke with one of the nurses in the office as well as with Dr. Jacinto Halim, regarding this issue, and was told to discontinue the oxycodone and to take Benadryl as needed for itching.  He did discontinue the oxycodone, but has had increased pain since that time for which he has not been taking any pain medication.  Otherwise, he is progressing slowly but steadily.  His appetite is generally getting better and he is walking several times a day.  His breathing has been stable.  He denies any weight gain or lower extremity edema.  He does have a followup appointment to see, Dr. Jacinto Halim next week.  On physical exam vital signs, blood pressure is 112/74, pulse is 97, respirations 20, O2 sat 99% on room air.  His right minithoracotomy incision and right groin incision have both healed well.  His chest tube sutures are removed without difficulty.  Heart is regular rate and rhythm without murmurs, rubs, or gallops.  Lungs are clear, although breath sounds are slightly diminished in the bases bilaterally.  He has no significant lower extremity edema.  Chest x-ray is stable with chronic elevation of his right hemidiaphragm and some mild basilar atelectasis.  ASSESSMENT AND PLAN:  The patient is overall doing well status post right minithoracotomy for mitral valve repair.  Dr.  [name], saw the patient today and reviewed his x-ray.  Since he was having so much trouble with itching which we feel is probably related to the oxycodone, we have asked him to discontinue this medication altogether and we have given a prescription for Vicodin #40 to take as needed for pain.  Dr. [name] has also instructed him that he can take Aleve or Motrin if needed to help with some of the inflammatory type pain.  At this point since he is maintaining sinus rhythm, we will also discontinue the amiodarone. He is nearly finished with his course of Lasix and as he is not significantly volume overloaded on exam, we will discontinue the Lasix and potassium as well to see if we can resolve the issue of the itching and rash.  We will plan to see him back in 8 weeks for recheck and he may call in the interim if he experiences any problems or has questions. He may begin to increase his activities slowly at this point and may begin driving provided he is not taking the pain medication regularly.  Coral Ceo, P.A.  GC/MEDQ  D:  06/25/2010  T:  06/26/2010  Job:  [id number]  cc:   Cristy Hilts. Jacinto Halim, MD Massie Maroon, MD

## 2010-07-16 ENCOUNTER — Encounter (HOSPITAL_COMMUNITY): Payer: Medicare HMO

## 2010-07-18 ENCOUNTER — Encounter (HOSPITAL_COMMUNITY): Payer: Medicare HMO | Attending: Thoracic Surgery (Cardiothoracic Vascular Surgery)

## 2010-07-18 ENCOUNTER — Encounter (HOSPITAL_COMMUNITY): Payer: Medicare HMO

## 2010-07-18 DIAGNOSIS — Z9889 Other specified postprocedural states: Secondary | ICD-10-CM | POA: Insufficient documentation

## 2010-07-18 DIAGNOSIS — R059 Cough, unspecified: Secondary | ICD-10-CM | POA: Insufficient documentation

## 2010-07-18 DIAGNOSIS — I2510 Atherosclerotic heart disease of native coronary artery without angina pectoris: Secondary | ICD-10-CM | POA: Insufficient documentation

## 2010-07-18 DIAGNOSIS — K219 Gastro-esophageal reflux disease without esophagitis: Secondary | ICD-10-CM | POA: Insufficient documentation

## 2010-07-18 DIAGNOSIS — Z9861 Coronary angioplasty status: Secondary | ICD-10-CM | POA: Insufficient documentation

## 2010-07-18 DIAGNOSIS — Z7982 Long term (current) use of aspirin: Secondary | ICD-10-CM | POA: Insufficient documentation

## 2010-07-18 DIAGNOSIS — Z5189 Encounter for other specified aftercare: Secondary | ICD-10-CM | POA: Insufficient documentation

## 2010-07-18 DIAGNOSIS — I059 Rheumatic mitral valve disease, unspecified: Secondary | ICD-10-CM | POA: Insufficient documentation

## 2010-07-18 DIAGNOSIS — I10 Essential (primary) hypertension: Secondary | ICD-10-CM | POA: Insufficient documentation

## 2010-07-18 DIAGNOSIS — M810 Age-related osteoporosis without current pathological fracture: Secondary | ICD-10-CM | POA: Insufficient documentation

## 2010-07-18 DIAGNOSIS — R05 Cough: Secondary | ICD-10-CM | POA: Insufficient documentation

## 2010-07-20 ENCOUNTER — Encounter (HOSPITAL_COMMUNITY): Payer: Medicare HMO

## 2010-07-23 ENCOUNTER — Encounter (HOSPITAL_COMMUNITY): Payer: Medicare HMO

## 2010-07-25 ENCOUNTER — Encounter (HOSPITAL_COMMUNITY): Payer: Medicare HMO

## 2010-07-27 ENCOUNTER — Encounter (HOSPITAL_COMMUNITY): Payer: Medicare HMO

## 2010-07-27 NOTE — Op Note (Signed)
NAMELC, JOYNT NO.:  [id number]  MEDICAL RECORD NO.:  [id number]           PATIENT TYPE:  I  LOCATION:  2304                         FACILITY:  [facility]  PHYSICIAN:  Guadalupe Maple, M.D.  DATE OF BIRTH:  [birth year]  DATE OF PROCEDURE:  06/13/2010 DATE OF DISCHARGE:                              OPERATIVE REPORT   PROCEDURE:  Intraoperative transesophageal echocardiography.  Mr. Derico Mitton is a 70 year old retired [employer] fund[employer] from [place], who began to complain of a dry nonproductive cough, and was subsequently found to have severe mitral regurgitation.  He is now scheduled to undergo mitral valve repair by Dr. [name] using the minimally invasive technique.  Intraoperative transesophageal echocardiography was requested to evaluate the mitral valve and to assist with the repair and to assist with cannula placement and to assess for any other valvular pathology and to serve as a monitor for intraoperative volume status.  The patient was brought to the operating room at [facility] and general anesthesia was induced without difficulty.  The trachea was intubated without difficulty.  Following orogastric suctioning, the transesophageal echocardiography probe was then inserted into the esophagus without difficulty.  IMPRESSION: Prebypass findings: 1. Mitral valve:  There was severe mitral regurgitation.  There     appeared to be a flail segment involving the P2 and P1 regions of     the posterior leaflet with multiple torn chordae evident.  There     was a jet of mitral insufficiency, which was anteriorly directed     along the superior surface of the anterior leaflet and the anterior     part of the interatrial septum and anterior left atrial wall.  This     was graded as severe, vena contracta measured 0.62 cm.  There     appeared to be some redundancy of the anterior leaflet, but no     frank billowing or prolapse noted, and the pathology  appeared to     involve the posterior leaflet.  These findings were confirmed with     the 3-D echocardiographic views as well. 2. Aortic valve:  There was moderate aortic insufficiency noted.  The     valve was trileaflet.  The leaflets opened well, but there was some     calcification at the base of the commissures at all three     commissures.  There appeared to be an area suggesting that there     was prolapse of the left coronary cusp with a jet of aortic     insufficiency along the coaptation line between the noncoronary and     the left coronary cusp.  There was acentric jet of aortic     insufficiency that was graded as moderate in the 2+ range. 3. Left ventricle:  There was moderate left ventricular enlargement,     left ventricular end-diastolic diameter measured 5.8 cm.  The     ventricular end-systolic diameter measured 3.6 cm at mid papillary     level in the short axis view.  Left ventricular wall thickness     measured 1.0-1.08 cm.  There was good contractility in  all segments     interrogated and left ventricular ejection fraction was estimated     at 60-65%.  There was no thrombus noted in the left ventricular     apex.  There were no focal defects in the left ventricular     contraction. 4. Right ventricle:  The right ventricular size was normal.  There was     normal contractility of the right ventricular free wall and normal-     appearing right ventricular function. 5. Tricuspid valve:  There was trace to 1+ tricuspid insufficiency     with structurally normal appearing tricuspid valve. 6. Pulmonic valve:  There was 1+ pulmonic insufficiency that was     evident. 7. Interatrial septum:  The interatrial septum was intact without     evidence of patent foramen ovale or atrial septal defect.  However,     the jet of mitral regurgitation did impinge upon the interatrial     septum. 8. Left atrium:  The left atrial cavity was enlarged, but there was no     thrombus  noted in the left atrium or left atrial appendage. 9. Ascending aorta:  The ascending aorta showed mild atheromatous     disease, especially in the anterior area of the aortic root.  There     was no aneurysmal dilation or mobile plaques noted. 10.Descending aorta:  The descending aorta showed mild atheromatous     disease.  It measured 2.0 cm in diameter.  Postbypass findings:  1. Mitral valve:  There was an annuloplasty ring in the mitral     position.  The valve opened normally.  There was no residual mitral     insufficiency.  Continuous wave Doppler interrogation of the mitral     inflow revealed a mean transmitral gradient of 2 mmHg and a mitral     valve area of 2.56 cm2 using the pressure half-time method. 2. Aortic valve:  The aortic valve was unchanged from the prebypass     study.  There was moderate aortic insufficiency. 3. Left ventricle:  The left ventricular contractile pattern was     somewhat altered due to ventricular pacing, but again the ejection     fraction appeared in the 60-65% range. 4. Right ventricle:  There was normal-appearing right ventricular     function. 5. Tricuspid valve:  There was trace to 1+ tricuspid insufficiency,     which is unchanged from the prebypass study.          ______________________________ Guadalupe Maple, M.D.     DCJ/MEDQ  D:  06/13/2010  T:  06/14/2010  Job:  [id number]  Electronically Signed by Kipp Brood M.D. on 07/27/2010 09:22:34 AM

## 2010-07-30 ENCOUNTER — Encounter (HOSPITAL_COMMUNITY): Payer: Medicare HMO

## 2010-08-01 ENCOUNTER — Encounter (HOSPITAL_COMMUNITY): Payer: Medicare HMO

## 2010-08-03 ENCOUNTER — Encounter (HOSPITAL_COMMUNITY): Payer: Medicare HMO

## 2010-08-06 ENCOUNTER — Encounter (HOSPITAL_COMMUNITY): Payer: Medicare HMO

## 2010-08-08 ENCOUNTER — Encounter (HOSPITAL_COMMUNITY): Payer: Medicare HMO

## 2010-08-10 ENCOUNTER — Encounter (HOSPITAL_COMMUNITY): Payer: Medicare HMO

## 2010-08-13 ENCOUNTER — Encounter (INDEPENDENT_AMBULATORY_CARE_PROVIDER_SITE_OTHER): Payer: Self-pay | Admitting: Thoracic Surgery (Cardiothoracic Vascular Surgery)

## 2010-08-13 ENCOUNTER — Encounter (HOSPITAL_COMMUNITY): Payer: Medicare HMO

## 2010-08-13 DIAGNOSIS — I059 Rheumatic mitral valve disease, unspecified: Secondary | ICD-10-CM

## 2010-08-14 NOTE — Assessment & Plan Note (Signed)
OFFICE VISIT  [name], [name] DOB:  03-02-1941                                        August 13, 2010 CHART #:  [id number]  HISTORY OF PRESENT ILLNESS:  The patient returns for followup now 2 months status post mitral valve repair.  His postoperative recovery has been uncomplicated.  He was last seen here in the office on June 25, 2010.  Since then he has progressed quite nicely.  He has been participating in a cardiac rehab program and he reports that his exercise tolerance is recovering nicely.  He still has soreness in his right chest wall related to his mini thoracotomy.  He states that this is improved considerably and on a scale of 0-10, he would rate it is now about a 2, much improved from before.  He has no shortness of breath. His appetite is good.  The remainder of his review of systems is unremarkable.  PHYSICAL EXAMINATION:  GENERAL:  A well-appearing male.  VITAL SIGNS: Blood pressure 129/80, pulse 85, and oxygen saturation 98% on room air. CHEST:  Auscultation of the chest demonstrates clear breath sounds that are symmetrical bilaterally.  No wheezes, rales, or rhonchi are noted. CARDIOVASCULAR:  Regular rate and rhythm.  No murmurs, rubs, or gallops are appreciated.  The mini thoracotomy incision has healed nicely. ABDOMEN:  Soft and nontender.  EXTREMITIES:  Warm and well perfused. There is no lower extremity edema.  IMPRESSION:  Satisfactory progress now 2 months following mitral valve repair.  PLAN:  I have encouraged the patient to continue to gradually increase his physical activity.  At this point, his only specific limitation is that related to comfort with respect to his mini thoracotomy.  Under the circumstances, he probably should continue to avoid any sort of heavy lifting or straining particularly with use of his right arm until the soreness has continued to resolve.  Otherwise, he has no specific limitations.  He has asked  about travel, and at this point I suspect that there is no reason why he cannot travel [place] as desired.  I have cautioned him that all the soreness will probably still take several weeks or even months to completely resolve, and his exercise tolerance will continue to gradually increase over time as long as he continues to work hard his own physical rehab.  All of his questions have been addressed.  We will plan to see him back in 4 months' time.  Salvatore Decent. [name], M.D. Electronically Signed  CHO/MEDQ  D:  08/13/2010  T:  08/14/2010  Job:  [id number]  cc:   Cristy Hilts. Jacinto Halim, MD Massie Maroon, MD

## 2010-08-15 ENCOUNTER — Encounter (HOSPITAL_COMMUNITY): Payer: Medicare HMO | Attending: Thoracic Surgery (Cardiothoracic Vascular Surgery)

## 2010-08-15 ENCOUNTER — Encounter (HOSPITAL_COMMUNITY): Payer: Medicare HMO

## 2010-08-15 DIAGNOSIS — M810 Age-related osteoporosis without current pathological fracture: Secondary | ICD-10-CM | POA: Insufficient documentation

## 2010-08-15 DIAGNOSIS — Z7982 Long term (current) use of aspirin: Secondary | ICD-10-CM | POA: Insufficient documentation

## 2010-08-15 DIAGNOSIS — Z9889 Other specified postprocedural states: Secondary | ICD-10-CM | POA: Insufficient documentation

## 2010-08-15 DIAGNOSIS — I2510 Atherosclerotic heart disease of native coronary artery without angina pectoris: Secondary | ICD-10-CM | POA: Insufficient documentation

## 2010-08-15 DIAGNOSIS — I10 Essential (primary) hypertension: Secondary | ICD-10-CM | POA: Insufficient documentation

## 2010-08-15 DIAGNOSIS — K219 Gastro-esophageal reflux disease without esophagitis: Secondary | ICD-10-CM | POA: Insufficient documentation

## 2010-08-15 DIAGNOSIS — I059 Rheumatic mitral valve disease, unspecified: Secondary | ICD-10-CM | POA: Insufficient documentation

## 2010-08-15 DIAGNOSIS — R05 Cough: Secondary | ICD-10-CM | POA: Insufficient documentation

## 2010-08-15 DIAGNOSIS — Z5189 Encounter for other specified aftercare: Secondary | ICD-10-CM | POA: Insufficient documentation

## 2010-08-15 DIAGNOSIS — R059 Cough, unspecified: Secondary | ICD-10-CM | POA: Insufficient documentation

## 2010-08-15 DIAGNOSIS — Z9861 Coronary angioplasty status: Secondary | ICD-10-CM | POA: Insufficient documentation

## 2010-08-17 ENCOUNTER — Encounter (HOSPITAL_COMMUNITY): Payer: Medicare HMO

## 2010-08-20 ENCOUNTER — Encounter: Payer: Medicare HMO | Admitting: Thoracic Surgery (Cardiothoracic Vascular Surgery)

## 2010-08-20 ENCOUNTER — Encounter (HOSPITAL_COMMUNITY): Payer: Medicare HMO

## 2010-08-22 ENCOUNTER — Encounter (HOSPITAL_COMMUNITY): Payer: Medicare HMO

## 2010-08-24 ENCOUNTER — Encounter (HOSPITAL_COMMUNITY): Payer: Medicare HMO

## 2010-08-27 ENCOUNTER — Encounter (HOSPITAL_COMMUNITY): Payer: Medicare HMO

## 2010-08-28 NOTE — Consult Note (Signed)
NEW PATIENT CONSULTATION   WIL, SLAPE  DOB:  31-Dec-1940                                        February 26, 2010  CHART #:  [id number]   DATE OF CONSULTATION:  February 26, 2010   REFERRING PHYSICIAN:  Dr. Yates Decamp   REASON FOR CONSULTATION:  Severe mitral regurgitation and coronary  artery disease.   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  The patient was first noted to  have a heart murmur on routine physical exam performed by his primary  care physician more than a year ago.  He recently began to complain of a  persistent dry nonproductive cough that seems to be worse when he lay  supine in bed.  He was reevaluated by his primary care physician and  sent for pulmonary consultation by Dr. Marchelle Gearing.  He was noted to have  a prominent heart murmur on physical exam and a 2-D echocardiogram was  performed in October of this year demonstrating the presence of mitral  valve prolapse with significant mitral regurgitation.  He was referred  to Dr. Jacinto Halim who performed transesophageal echocardiogram on November 2.  This confirmed the presence of mitral valve prolapse with severe mitral  regurgitation.  There is normal left ventricular systolic function.  There was mild-to-moderate left ventricular chamber enlargement.  There  was severe left atrial enlargement.  There is mild aortic regurgitation.  No other significant abnormalities were noted.  The patient subsequently  underwent left and right heart catheterization by Dr. Jacinto Halim on February 20, 2010.  This confirmed the presence of severe mitral regurgitation.  There is normal left ventricular systolic function with ejection  fraction estimated 60%.  There is severe regurgitation and right heart  catheterization was notable for minimal elevation of pulmonary artery  pressures  but large V-waves consistent with severe regurgitation.  Coronary arteriography revealed severe single-vessel coronary artery  disease with high-grade stenosis of the right coronary artery.  The  patient has been referred for surgical consultation to discuss possible  treatment options.   REVIEW OF SYSTEMS:  GENERAL:  The patient reports normal appetite.  He  has not been gaining nor losing weight recently.  He is 5 feet, 7.5  inches tall and weighs approximately 165 pounds.  CARDIAC:  The patient denies any shortness of breath either with  activity or at rest.  He does report a persistent dry nonproductive  cough that seems to be worse when he lays flat in bed at night.  He  denies resting shortness of breath, PND, orthopnea, or lower extremity  edema.  He has not had any tachy palpitations nor dizzy spells.  RESPIRATORY:  He did have some shortness of breath and wheezing several  weeks back, but he has not had any recently.  He has his dry  nonproductive cough as noted.  He denies productive cough, hemoptysis.  GASTROINTESTINAL:  Notable for longstanding symptoms of GE reflux that  are typically well controlled with Nexium.  He seems to think that his  cough may be related as well.  He has no difficulty swallowing.  He  reports normal bowel function.  He denies hematochezia, hematemesis,  melena.  GENITOURINARY:  Notable for some nocturia and  frequent urination.  He  denies urinary urgency or frequency.  MUSCULOSKELETAL:  Notable for mild  arthritis and arthralgias, but this does not seem to limit him  significantly at all.  PERIPHERAL VASCULAR:  The patient denies any symptoms suggestive  claudication.  He does occasionally get cramps in his calf muscles, more  so on the left than the right.  This usually gets better if he eats  extra bananas or take the potassium supplement.  NEUROLOGIC:  Notable in that the patient did suffer what may have been a  minor stroke, immediately  following his cardiac catheterization last  week.  He states that after the catheterization, he had been seeing  double vision for a period of nearly 24 hours.  He was evaluated in the  emergency room and seen by Dr.  Pearlean Brownie from the Neurology team.  Brain CT was unremarkable but MRI and  MRA of the brain confirmed what appeared to be probable small acute  lacunar infarcts, presumably related to embolization at the time of  catheterization.  PSYCHIATRIC:  Negative.  HEENT:  Negative.  The patient denies any loose teeth or ongoing dental issues.  He sees  his dentist on a regular basis.  HEMATOLOGICAL:  Negative.   PAST MEDICAL HISTORY:  1. Mitral valve prolapse and mitral regurgitation.  2. Single-vessel coronary artery disease.  3. Hypertension.  4. GE reflux disease.  5. Osteoporosis.  6. Acute reversible ischemic neurologic deficit following cardiac      catheterization, November 2011.  7. Persistent cough.   PAST SURGICAL HISTORY:  Appendectomy.   FAMILY HISTORY:  Notable in that the patient's mother died of heart  attack at age 51.  The patient's father died of heart attack but in his  62s.   SOCIAL HISTORY:  The patient is married and lives with his wife here in  [place].  He is retired having previously worked for [employer]  for  many years.  He is a nonsmoker.  He denies excessive alcohol  consumption.  He lives a somewhat sedentary lifestyle, although he does  enjoy playing golf.   CURRENT MEDICATIONS:  1. Losartan/hydrochlorothiazide 100/25 one tablet daily.  2. Nexium 40 mg daily.  3. Zyrtec 10 mg daily.  4. Crestor 10 mg daily.  5. Aspirin 325 mg daily.   DRUG ALLERGIES:  Penicillin, which causes rash.   PHYSICAL EXAMINATION:  General:  The patient is a well-appearing [place]  male who appears of his stated age, in no acute distress.  Vital Signs:  Blood pressure 128/70, pulse 73 and regular, oxygen saturation 98% on  room air.  HEENT:  Unrevealing.   Neck:  Supple.  There is no cervical  nor supraclavicular lymphadenopathy.  There is no jugular venous  distention.  There were no carotid bruits.  Chest:  Auscultation of the  chest reveals clear breath sounds that are symmetrical bilaterally.  No  wheezes, rales or rhonchi are noted.  Cardiovascular:  Regular rate and  rhythm.  There is a very prominent grade 4/6 holosystolic murmur heard  along the left sternal border and at the apex with radiation all across  the precordium.  No diastolic murmurs are noted.  Abdomen:  Soft, mildly  obese, nontender.  There are no palpable masses.  Bowel sounds are  present.  Extremities:  Warm and well perfused.  There is no lower  extremity edema.  Distal pulses are easily palpable in the posterior  tibial position bilaterally.  The patient has  had catheterization on the  left groin.  Right femoral pulse is strong.  There is no sign of lower  extremity edema.  There is no sign of any venous insufficiency.  Rectal/GU:  Both deferred.  Neurologic:  Grossly nonfocal and  symmetrical throughout.   DIAGNOSTIC TESTS:  Transesophageal echocardiogram performed by Dr. Jacinto Halim  on February 14, 2010, is reviewed.  This demonstrates mitral valve  prolapse with severe (4+) mitral regurgitation.  Specifically, there is  billowing bileaflet prolapse of the mitral valve, fairly large and tall  posterior leaflet and findings consistent with Barlow syndrome.  There  is a flail segment of the posterior leaflet that may be somewhat  eccentrically towards P1 but involving the PT scalp.  There is obvious  ruptured primary cords in this region.  The jet of regurgitation courses  somewhat eccentrically anteriorly around the left atrium.  There is left  atrial enlargement.  There is mild-to-moderate left ventricular chamber  enlargement.  There is mild (1+) aortic insufficiency.  Aortic valve was  tricuspid and all 3 leaflets move well.  No other significant  abnormalities are  noted.  There is no significant tricuspid  regurgitation.   Left and right heart catheterization performed by Dr. Jacinto Halim on November  8 is reviewed.  This confirms the presence of severe mitral  regurgitation.  Pulmonary artery pressures were recorded 36/18 with  pulmonary capillary wedge pressure of 20, but large V-waves suggestive  of severe mitral regurgitation.  Central venous pressure was 5.  Coronary arteriography revealed severe single-vessel coronary artery  disease.  There is segmental tubular 60% stenosis of the proximal right  coronary artery with high-grade 90% stenosis of the mid-right coronary  artery and right dominant coronary circulation.  There are luminal  irregularities in the left coronary circulation without any significant  flow-limiting lesions.  No other abnormalities were noted.   IMPRESSION:  Mitral valve prolapse with severe mitral regurgitation and  single-vessel coronary artery disease.  The patient remains relatively  asymptomatic from cardiac standpoint, although he does have a dry  nonproductive cough that could represent symptoms of congestive heart  failure or could also be related to his history of chronic reflux.  He  denies any exertional shortness of breath.  He has very high-grade focal  stenosis of the mid-right coronary artery as well.  There is preserved  left ventricular function, although there is some left ventricular  chamber enlargement.  Options include surgical intervention to consist  of mitral valve repair with coronary artery bypass grafting to a  conventional sternotomy.  An alternative strategy might include  percutaneous coronary intervention and stenting of the right coronary  artery followed by minimally invasive approach for right minithoracotomy  for mitral valve repair.  Finally, medical therapy is an option,  although I do not feel that this would be wise under the circumstances.   PLAN:  I have discussed matters at length  with the patient and his  entire family.  The rationale for the need to proceed with definitive  management of both his mitral valve disease and coronary artery disease  has been discussed in detail.  We have compared and contrasted  conventional sternotomy approach for mitral valve repair with coronary  artery bypass grafting versus minimally invasive approach consisting  percutaneous coronary intervention and stenting of the right coronary  artery with delayed right minithoracotomy for mitral valve repair.  All  of their questions have been addressed.  The patient wants to think  matters over further while he recovers from this minor stroke that he  suffered immediately after his catheterization.  We will obtain notes  from Dr. Jane Canary office and Dr. Marlis Edelson office and discuss matters  further.  The patient and his family will return for further  consultation in 2 weeks.  All of their questions have been addressed.   Salvatore Decent. [name], M.D.  Electronically Signed   CHO/MEDQ  D:  02/26/2010  T:  02/27/2010  Job:  [id number]   cc:   Cristy Hilts. Jacinto Halim, MD  Massie Maroon, MD  Pramod P. Pearlean Brownie, MD  Kalman Shan, MD

## 2010-08-28 NOTE — Assessment & Plan Note (Signed)
OFFICE VISIT   [name], [name]  DOB:  [birth year]                                        April 06, 2010  CHART #:  [id number]   [name] returns with his family for further consultation regarding  possible surgical treatment of severe mitral regurgitation and single-  vessel coronary artery disease.  He was originally seen in consultation  on February 26, 2010, and a full consultation note, history and physical  exam was dictated at that time.  Since then [name] has gone to see  Dr. Judd Gaudier and Dr. Nolon Rod at [facility]  for second opinions.  After thinking things over, he is now comfortable  with the idea of proceeding with PCI and stenting of his right coronary  artery followed by delayed right mini thoracotomy for mitral valve  repair.  In addition, he underwent a sniff test in Radiology on March 14, 2010, to evaluate the fact that he has elevated right hemidiaphragm  on his baseline chest x-ray.  Sniff test revealed normal direction of  movement of both diaphragms with inspiration.  Diaphragmatic excursion  on the right was only 2.5 cm where as it was 5 cm on the left.  However,  the right diaphragm did move and was not completely paralyzed.   I spent an excess of 20 minutes again discussing options with Mr.  [name] and his family.  He plans to set up elective PCI and stenting  of his right coronary artery by Dr. Jacinto Halim during early January.  We will  plan to see him back on May 01, 2010, after he has recovered from  his PCI and stenting.  At that point, he will be taking Plavix, and we  will plan to get a Plavix test to see what his platelet function and  percentage inhibition is while taking Plavix.  We will tentatively  plan to proceed with surgery sometime in early February.  All of his  questions have been addressed.   Salvatore Decent. [name], M.D.  Electronically Signed   CHO/MEDQ  D:  04/06/2010   T:  04/06/2010  Job:  [id number]   cc:   Cristy Hilts. Jacinto Halim, MD  Massie Maroon, MD  Pramod P. Pearlean Brownie, MD  Kalman Shan, MD

## 2010-08-29 ENCOUNTER — Encounter (HOSPITAL_COMMUNITY): Payer: Medicare HMO

## 2010-08-31 ENCOUNTER — Encounter (HOSPITAL_COMMUNITY): Payer: Medicare HMO

## 2010-08-31 NOTE — Op Note (Signed)
   NAME:  [name], [name] NO.:  [id number]   MEDICAL RECORD NO.:  [id number]                   PATIENT TYPE:  AMB   LOCATION:  ENDO                                 FACILITY:  [facility]   PHYSICIAN:  Georgiana Spinner, M.D.                 DATE OF BIRTH:  January 08, 1941   DATE OF PROCEDURE:  DATE OF DISCHARGE:                                 OPERATIVE REPORT   PROCEDURE:  Colonoscopy.   INDICATION:  Colon cancer screening.   ANESTHESIA:  Demerol 25 mg, Versed 2 mg, additionally.   DESCRIPTION OF PROCEDURE:  With the patient mildly sedated in the left  lateral decubitus position, the Olympus videoscopic colonoscope was inserted  in the rectum and passed under direct vision to the cecum identified by the  ileocecal valve and appendiceal orifice, both of which were photographed.  From this point, the colonoscope was slowly withdrawn, taking  circumferential views of the entire colonic mucosa, stopping only in the  rectum which appeared normal on direct and hemorrhoids in retroflex view.  The endoscope was straightened and withdrawn.  The patient's vital signs and  pulse oximetry remained stable, the patient tolerated the procedure well  without apparent complications.   FINDINGS:  Internal hemorrhoids; otherwise unremarkable examination.   PLAN:  Have the patient follow up with me in 5 to 10 years or as needed.                                               Georgiana Spinner, M.D.    GMO/MEDQ  D:  06/14/2002  T:  06/14/2002  Job:  [id number]   cc:   Quita Skye. Artis Flock, M.D.  [address], [address]  [place]  [place] [address]  Fax: [phone]

## 2010-08-31 NOTE — Op Note (Signed)
   NAME:  [name], [name] NO.:  [id number]   MEDICAL RECORD NO.:  [id number]                   PATIENT TYPE:  AMB   LOCATION:  ENDO                                 FACILITY:  [facility]   PHYSICIAN:  Georgiana Spinner, M.D.                 DATE OF BIRTH:  Feb 15, 1941   DATE OF PROCEDURE:  DATE OF DISCHARGE:                                 OPERATIVE REPORT   PROCEDURE:  Upper endoscopy.   INDICATIONS:  GERD.   ANESTHESIA:  Demerol 50 mg, Versed 4 mg.   DESCRIPTION OF PROCEDURE:  With the patient mildly sedated in the left  lateral decubitus position, the Olympus videoscopic endoscope was inserted  into the mouth and passed under direct vision through the esophagus which  appeared normal and into the stomach.  The body, fundus, antrum, duodenal  bulb, and second portion of the duodenum all appeared normal.  From this  point, the endoscope was slowly withdrawn, taking circumferential views and  the duodenal mucosa visualized until the endoscope was pulled into the  stomach and placed in retroflexion, viewing the stomach from below.  The  endoscope was then straightened and withdrawn, taking circumferential views  of the remaining gastric and esophageal mucosa.  The patient's vital signs  and pulse oximetry remained stable and the patient tolerated the procedure  well without apparent complications.   FINDINGS:  Unremarkable examination.   PLAN:  Proceed to colonoscopy.                                               Georgiana Spinner, M.D.    GMO/MEDQ  D:  06/14/2002  T:  06/14/2002  Job:  [id number]   cc:   Quita Skye. Artis Flock, M.D.  [address], [address]  [place]  [place] [address]  Fax: [phone]

## 2010-09-03 ENCOUNTER — Encounter (HOSPITAL_COMMUNITY): Payer: Medicare HMO

## 2010-09-05 ENCOUNTER — Encounter (HOSPITAL_COMMUNITY): Payer: Medicare HMO

## 2010-09-07 ENCOUNTER — Encounter (HOSPITAL_COMMUNITY): Payer: Medicare HMO

## 2010-09-10 ENCOUNTER — Encounter (HOSPITAL_COMMUNITY): Payer: Medicare HMO

## 2010-09-12 ENCOUNTER — Encounter (HOSPITAL_COMMUNITY): Payer: Medicare HMO

## 2010-09-14 ENCOUNTER — Encounter (HOSPITAL_COMMUNITY): Payer: Medicare HMO

## 2010-09-14 ENCOUNTER — Encounter (HOSPITAL_COMMUNITY): Payer: Medicare HMO | Attending: Thoracic Surgery (Cardiothoracic Vascular Surgery)

## 2010-09-14 DIAGNOSIS — Z9889 Other specified postprocedural states: Secondary | ICD-10-CM | POA: Insufficient documentation

## 2010-09-14 DIAGNOSIS — I059 Rheumatic mitral valve disease, unspecified: Secondary | ICD-10-CM | POA: Insufficient documentation

## 2010-09-14 DIAGNOSIS — I2510 Atherosclerotic heart disease of native coronary artery without angina pectoris: Secondary | ICD-10-CM | POA: Insufficient documentation

## 2010-09-14 DIAGNOSIS — I10 Essential (primary) hypertension: Secondary | ICD-10-CM | POA: Insufficient documentation

## 2010-09-14 DIAGNOSIS — Z5189 Encounter for other specified aftercare: Secondary | ICD-10-CM | POA: Insufficient documentation

## 2010-09-14 DIAGNOSIS — M810 Age-related osteoporosis without current pathological fracture: Secondary | ICD-10-CM | POA: Insufficient documentation

## 2010-09-14 DIAGNOSIS — R05 Cough: Secondary | ICD-10-CM | POA: Insufficient documentation

## 2010-09-14 DIAGNOSIS — R059 Cough, unspecified: Secondary | ICD-10-CM | POA: Insufficient documentation

## 2010-09-14 DIAGNOSIS — Z7982 Long term (current) use of aspirin: Secondary | ICD-10-CM | POA: Insufficient documentation

## 2010-09-14 DIAGNOSIS — K219 Gastro-esophageal reflux disease without esophagitis: Secondary | ICD-10-CM | POA: Insufficient documentation

## 2010-09-14 DIAGNOSIS — Z9861 Coronary angioplasty status: Secondary | ICD-10-CM | POA: Insufficient documentation

## 2010-09-17 ENCOUNTER — Encounter (HOSPITAL_COMMUNITY): Payer: Medicare HMO

## 2010-09-19 ENCOUNTER — Encounter (HOSPITAL_COMMUNITY): Payer: Medicare HMO

## 2010-09-21 ENCOUNTER — Encounter (HOSPITAL_COMMUNITY): Payer: Medicare HMO

## 2010-09-24 ENCOUNTER — Encounter (HOSPITAL_COMMUNITY): Payer: Medicare HMO

## 2010-09-26 ENCOUNTER — Encounter (HOSPITAL_COMMUNITY): Payer: Medicare HMO

## 2010-09-28 ENCOUNTER — Encounter (HOSPITAL_COMMUNITY): Payer: Medicare HMO

## 2010-10-01 ENCOUNTER — Encounter (HOSPITAL_COMMUNITY): Payer: Medicare HMO

## 2010-10-03 ENCOUNTER — Encounter (HOSPITAL_COMMUNITY): Payer: Medicare HMO

## 2010-10-05 ENCOUNTER — Encounter (HOSPITAL_COMMUNITY): Payer: Medicare HMO

## 2010-10-08 ENCOUNTER — Encounter (HOSPITAL_COMMUNITY): Payer: Medicare HMO

## 2010-10-10 ENCOUNTER — Encounter (HOSPITAL_COMMUNITY): Payer: Medicare HMO

## 2010-10-12 ENCOUNTER — Encounter (HOSPITAL_COMMUNITY): Payer: Medicare HMO

## 2010-10-15 ENCOUNTER — Encounter (HOSPITAL_COMMUNITY): Payer: Medicare HMO

## 2010-10-15 ENCOUNTER — Encounter (HOSPITAL_COMMUNITY): Payer: Medicare HMO | Attending: Thoracic Surgery (Cardiothoracic Vascular Surgery)

## 2010-10-15 DIAGNOSIS — M810 Age-related osteoporosis without current pathological fracture: Secondary | ICD-10-CM | POA: Insufficient documentation

## 2010-10-15 DIAGNOSIS — I2510 Atherosclerotic heart disease of native coronary artery without angina pectoris: Secondary | ICD-10-CM | POA: Insufficient documentation

## 2010-10-15 DIAGNOSIS — I10 Essential (primary) hypertension: Secondary | ICD-10-CM | POA: Insufficient documentation

## 2010-10-15 DIAGNOSIS — R059 Cough, unspecified: Secondary | ICD-10-CM | POA: Insufficient documentation

## 2010-10-15 DIAGNOSIS — Z7982 Long term (current) use of aspirin: Secondary | ICD-10-CM | POA: Insufficient documentation

## 2010-10-15 DIAGNOSIS — K219 Gastro-esophageal reflux disease without esophagitis: Secondary | ICD-10-CM | POA: Insufficient documentation

## 2010-10-15 DIAGNOSIS — R05 Cough: Secondary | ICD-10-CM | POA: Insufficient documentation

## 2010-10-15 DIAGNOSIS — I059 Rheumatic mitral valve disease, unspecified: Secondary | ICD-10-CM | POA: Insufficient documentation

## 2010-10-15 DIAGNOSIS — Z5189 Encounter for other specified aftercare: Secondary | ICD-10-CM | POA: Insufficient documentation

## 2010-10-15 DIAGNOSIS — Z9889 Other specified postprocedural states: Secondary | ICD-10-CM | POA: Insufficient documentation

## 2010-10-15 DIAGNOSIS — Z9861 Coronary angioplasty status: Secondary | ICD-10-CM | POA: Insufficient documentation

## 2010-10-17 ENCOUNTER — Encounter (HOSPITAL_COMMUNITY): Payer: Medicare HMO

## 2010-10-19 ENCOUNTER — Encounter (HOSPITAL_COMMUNITY): Payer: Medicare HMO

## 2010-10-22 ENCOUNTER — Encounter (HOSPITAL_COMMUNITY): Payer: Medicare HMO

## 2010-12-21 DIAGNOSIS — M810 Age-related osteoporosis without current pathological fracture: Secondary | ICD-10-CM | POA: Insufficient documentation

## 2010-12-21 DIAGNOSIS — K219 Gastro-esophageal reflux disease without esophagitis: Secondary | ICD-10-CM

## 2010-12-21 DIAGNOSIS — I10 Essential (primary) hypertension: Secondary | ICD-10-CM

## 2010-12-21 DIAGNOSIS — I341 Nonrheumatic mitral (valve) prolapse: Secondary | ICD-10-CM | POA: Insufficient documentation

## 2010-12-21 DIAGNOSIS — I2510 Atherosclerotic heart disease of native coronary artery without angina pectoris: Secondary | ICD-10-CM | POA: Insufficient documentation

## 2010-12-24 ENCOUNTER — Encounter: Payer: Medicare HMO | Admitting: Thoracic Surgery (Cardiothoracic Vascular Surgery)

## 2011-01-08 ENCOUNTER — Encounter: Payer: Self-pay | Admitting: Thoracic Surgery (Cardiothoracic Vascular Surgery)

## 2011-01-11 ENCOUNTER — Encounter (HOSPITAL_COMMUNITY): Payer: Medicare HMO

## 2011-01-14 ENCOUNTER — Ambulatory Visit (INDEPENDENT_AMBULATORY_CARE_PROVIDER_SITE_OTHER): Payer: Medicare HMO | Admitting: Thoracic Surgery (Cardiothoracic Vascular Surgery)

## 2011-01-14 ENCOUNTER — Encounter: Payer: Self-pay | Admitting: Thoracic Surgery (Cardiothoracic Vascular Surgery)

## 2011-01-14 VITALS — BP 120/75 | HR 84 | Resp 20 | Ht 67.5 in | Wt 164.0 lb

## 2011-01-14 DIAGNOSIS — Z9889 Other specified postprocedural states: Secondary | ICD-10-CM | POA: Insufficient documentation

## 2011-01-14 DIAGNOSIS — I059 Rheumatic mitral valve disease, unspecified: Secondary | ICD-10-CM

## 2011-01-14 NOTE — Progress Notes (Signed)
PCP is Pearson Grippe, MD, MD Referring Provider is Massie Maroon., MD  Chief Complaint  Patient presents with  . Routine Post Op    S/P rt mini thoracotomy for Mitral Valve Repair on2/29/12    HPI:  Patient returns for routine followup status post mitral valve repair on 06/13/2010. He was last seen here in our office on 08/13/2010. Since then he has done very well from a cardiovascular standpoint. He has exercise tolerance it is better than it was prior to surgery and he denies any exertional shortness of breath. He has not had any exertional chest pain. He does still have some mild pain in the right lateral chest wall related to his miniature thoracotomy incision. He reports that this has continued to gradually improve, and he states that it is now 50% better than it was last spring. He states that it does not keep him from doing any type of physical activities and overall it doesn't bother him much. The pain is described as muscle like soreness in the right chest that is worse with palpation of the right chest wall. He states that he also occasionally gets some paresthesias in the more anterior aspect of the right chest. This typically occurs when he is sleeping. The remainder of his review of systems is completely unremarkable. He reports that he has not had a followup echocardiogram since last spring. He is scheduled to have an echocardiogram and visit with his cardiologist in November.   Past Medical History  Diagnosis Date  . HTN (hypertension)   . GERD (gastroesophageal reflux disease)   . Osteoporosis   . CAD (coronary artery disease)   . Mitral valve prolapse     Past Surgical History  Procedure Date  . Appendectomy   . Coronary angioplasty with stent placement 04/30/2010    PCI and stenting of proximal RCA  . Mitral valve repair 06/13/2010    right mini thoracotomy for complex valvuloplasty with 28mm Memo ring annuloplasty - Dr. [name]    Family History  Problem Relation Age of  Onset  . Heart disease Father   . Heart disease Mother     Social History History  Substance Use Topics  . Smoking status: Never Smoker   . Smokeless tobacco: Never Used  . Alcohol Use: Yes    Current Outpatient Prescriptions  Medication Sig Dispense Refill  . aspirin 81 MG tablet Take 81 mg by mouth daily.        . calcium-vitamin D (OSCAL WITH D) 500-200 MG-UNIT per tablet Take 2 tablets by mouth daily.        . carvedilol (COREG) 6.25 MG tablet Take 6.25 mg by mouth 2 (two) times daily with a meal.        . cetirizine (ZYRTEC) 10 MG tablet Take 10 mg by mouth daily.        . clopidogrel (PLAVIX) 75 MG tablet Take 75 mg by mouth daily.        Marland Kitchen Dexlansoprazole 30 MG capsule Take 30 mg by mouth daily.        Marland Kitchen ibandronate (BONIVA) 3 MG/3ML SOLN injection Inject 3 mg into the vein once. Every 3 months       . losartan-hydrochlorothiazide (HYZAAR) 50-12.5 MG per tablet Take 1 tablet by mouth daily.        . Multiple Vitamin (MULTIVITAMIN) capsule Take 1 capsule by mouth daily.        . rosuvastatin (CRESTOR) 10 MG tablet Take 10 mg by mouth  daily.          Allergies  Allergen Reactions  . Penicillins     Review of Systems  Constitutional: Negative.   HENT: Negative.   Eyes: Negative.   Respiratory: Negative.   Cardiovascular: Negative.   Gastrointestinal: Negative.   Genitourinary: Negative.   Musculoskeletal: Positive for arthralgias.       Right anterolateral chestwall pain  Neurological: Negative.   Hematological: Negative.   Psychiatric/Behavioral: Negative.     BP 120/75  Pulse 84  Resp 20  Ht 5' 7.5" (1.715 m)  Wt 164 lb (74.39 kg)  BMI 25.31 kg/m2  SpO2 98% Physical Exam  Vitals reviewed. Constitutional: He is oriented to person, place, and time. He appears well-developed and well-nourished.  HENT:  Head: Normocephalic.  Eyes: Pupils are equal, round, and reactive to light.  Neck: Normal range of motion. Neck supple. No JVD present.  Cardiovascular:  Normal rate, regular rhythm and normal heart sounds.   No murmur heard. Pulmonary/Chest: Effort normal and breath sounds normal. He exhibits tenderness.       Mild tenderness with palpation over his mini thoracotomy scar.  No hernia present.  Abdominal: Soft. Bowel sounds are normal.  Musculoskeletal: Normal range of motion.  Neurological: He is alert and oriented to person, place, and time.  Skin: Skin is warm and dry.  Psychiatric: He has a normal mood and affect. His behavior is normal. Judgment and thought content normal.     Impression:  Patient is doing very well from a cardiovascular standpoint status post mitral valve repair 06/13/2010. He does still have mild post thoracotomy pain associated with his minithoracotomy incision. This continues to gradually improve and is not effecting the patient's distal activity whatsoever. His exercise tolerance is quite good and he does not have a murmur on physical exam.   Plan:  Patient will return for further followup in 6 months. We will look for to the results of his upcoming followup echocardiogram.

## 2011-04-30 ENCOUNTER — Other Ambulatory Visit (HOSPITAL_COMMUNITY): Payer: Self-pay | Admitting: *Deleted

## 2011-05-02 ENCOUNTER — Ambulatory Visit (HOSPITAL_COMMUNITY): Payer: Medicare HMO

## 2011-05-03 ENCOUNTER — Other Ambulatory Visit (HOSPITAL_COMMUNITY): Payer: Self-pay | Admitting: *Deleted

## 2011-05-08 ENCOUNTER — Encounter (HOSPITAL_COMMUNITY): Admission: RE | Admit: 2011-05-08 | Payer: Medicare HMO | Source: Ambulatory Visit

## 2011-05-08 DIAGNOSIS — R490 Dysphonia: Secondary | ICD-10-CM | POA: Insufficient documentation

## 2011-07-15 ENCOUNTER — Ambulatory Visit: Payer: Medicare HMO | Admitting: Thoracic Surgery (Cardiothoracic Vascular Surgery)

## 2011-07-22 ENCOUNTER — Encounter: Payer: Self-pay | Admitting: Thoracic Surgery (Cardiothoracic Vascular Surgery)

## 2011-07-22 ENCOUNTER — Ambulatory Visit (INDEPENDENT_AMBULATORY_CARE_PROVIDER_SITE_OTHER): Payer: Self-pay | Admitting: Thoracic Surgery (Cardiothoracic Vascular Surgery)

## 2011-07-22 VITALS — BP 124/74 | HR 62 | Resp 16 | Ht 69.0 in | Wt 166.0 lb

## 2011-07-22 DIAGNOSIS — Z9889 Other specified postprocedural states: Secondary | ICD-10-CM

## 2011-07-22 NOTE — Progress Notes (Signed)
                   [address]            [place] [address]          [phone]     CARDIOTHORACIC SURGERY OFFICE NOTE  Referring Provider is Pamella Pert, MD PCP is Pearson Grippe, MD, MD   HPI:  Patient returns for routine followup more than 1 year following minimally invasive mitral valve repair. He was last seen here in the office in October 2012. Since then he has done exceptionally well. He apparently had a followup echocardiogram and stress test performed at Dr. Verl Dicker office. We do not have any results from these exams. The patient reports clinically doing exceptionally well. He has no exertional shortness of breath. He has no exertional chest pain. All the soreness related to his previous surgery has resolved. Overall he reports good exercise tolerance and he is doing quite well.   Current Outpatient Prescriptions  Medication Sig Dispense Refill  . aspirin 81 MG tablet Take 81 mg by mouth daily.        . calcium-vitamin D (OSCAL WITH D) 500-200 MG-UNIT per tablet Take 2 tablets by mouth daily.        . carvedilol (COREG) 6.25 MG tablet Take 6.25 mg by mouth 2 (two) times daily with a meal.        . cetirizine (ZYRTEC) 10 MG tablet Take 10 mg by mouth daily.        . clopidogrel (PLAVIX) 75 MG tablet Take 75 mg by mouth daily.        Marland Kitchen Dexlansoprazole 30 MG capsule Take 30 mg by mouth daily.        Marland Kitchen ibandronate (BONIVA) 3 MG/3ML SOLN injection Inject 3 mg into the vein once. Every 3 months       . losartan-hydrochlorothiazide (HYZAAR) 50-12.5 MG per tablet Take 1 tablet by mouth daily.        . rosuvastatin (CRESTOR) 10 MG tablet Take 10 mg by mouth daily.        . Multiple Vitamin (MULTIVITAMIN) capsule Take 1 capsule by mouth daily.            Physical Exam:   BP 124/74  Pulse 62  Resp 16  Ht 5\' 9"  (1.753 m)  Wt 166 lb (75.297 kg)  BMI 24.51 kg/m2  SpO2 98%  General:  Well-appearing  Chest:   Clear to auscultation  CV:   Regular rate and  rhythm without murmur  Incisions:  Completely healed  Abdomen:  Soft and nontender  Extremities:  Warm and well-perfused  Diagnostic Tests:  n/a   Impression:  Excellent progress more than 1 year following minimally invasive mitral valve repair. The patient looks quite good and has normal exercise tolerance. He does not have a murmur on exam. We do not have any results from any followup echocardiograms.  Plan:  We will contact Dr. Verl Dicker office and request for report of any followup echocardiograms to be referred. In the future the patient will call and return to see [place] as needed. All of his questions been addressed.   Salvatore Decent. [name], MD 07/22/2011 12:07 PM

## 2013-07-08 ENCOUNTER — Ambulatory Visit (INDEPENDENT_AMBULATORY_CARE_PROVIDER_SITE_OTHER): Payer: Medicare Other | Admitting: Internal Medicine

## 2013-07-08 ENCOUNTER — Encounter: Payer: Self-pay | Admitting: Internal Medicine

## 2013-07-08 VITALS — BP 110/62 | HR 78 | Ht 67.5 in | Wt 166.2 lb

## 2013-07-08 DIAGNOSIS — R0689 Other abnormalities of breathing: Secondary | ICD-10-CM

## 2013-07-08 DIAGNOSIS — R011 Cardiac murmur, unspecified: Secondary | ICD-10-CM

## 2013-07-08 DIAGNOSIS — R05 Cough: Secondary | ICD-10-CM

## 2013-07-08 DIAGNOSIS — R059 Cough, unspecified: Secondary | ICD-10-CM

## 2013-07-08 DIAGNOSIS — R0600 Dyspnea, unspecified: Secondary | ICD-10-CM

## 2013-07-08 DIAGNOSIS — R0989 Other specified symptoms and signs involving the circulatory and respiratory systems: Secondary | ICD-10-CM

## 2013-07-08 DIAGNOSIS — R0609 Other forms of dyspnea: Secondary | ICD-10-CM

## 2013-07-08 LAB — PULMONARY FUNCTION TEST
DL/VA % pred: 130 %
DL/VA: 5.78 ml/min/mmHg/L
DLCO unc % pred: 70 %
DLCO unc: 20.55 ml/min/mmHg
FEF 25-75 Post: 1.66 L/sec
FEF 25-75 Pre: 0.96 L/sec
FEF2575-%Change-Post: 73 %
FEV1-%Change-Post: 15 %
FEV1-Post: 1.47 L
FEV1-Pre: 1.27 L
FEV1FVC-%Change-Post: 4 %
FEV6-%Change-Post: 16 %
FEV6-Post: 1.87 L
FEV6-Pre: 1.61 L
FEV6FVC-%Change-Post: 5 %
FVC-%Change-Post: 10 %
FVC-Post: 1.87 L
FVC-Pre: 1.69 L
Post FEV1/FVC ratio: 79 %
Post FEV6/FVC ratio: 100 %
Pre FEV1/FVC ratio: 75 %
Pre FEV6/FVC Ratio: 95 %
RV % pred: 74 %
RV: 1.75 L
TLC % pred: 54 %
TLC: 3.56 L

## 2013-07-08 NOTE — Progress Notes (Signed)
PFT done today. 

## 2013-07-08 NOTE — Progress Notes (Signed)
Subjective:    Patient ID: [name], male    DOB: 1940/09/23, 73 y.o.   MRN: [id number]  HPI  Chief Complaint  Patient presents with  . Follow-up    Pt states x 3 months he has been having a dry cough, and runny nose in the mornings.     73 year old [place] male. 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 Settled in [place] for over 30 years. Retired from [employer] seen in the setting of post viral reactive cough after 2 to [place] in 2012. At that time mitral valve regurgitation was diagnosed on physical exam along with chronic paralyzed right hemidiaphragm preop and he status post mitral valve replacement by Dr. Dagoberto Reef in 2012. Subsequent to the surgery he says that is chronic cough resolved or perhaps he's had mild chronic cough at night on some days. He is overall been doing well. In November 2014 he undertook a trip to [place] which was fairly uneventful except for the fact that he tripped and fell on the street and bruised his nose and forehead. Of note, prior to the trip to [place] he did have a bit of a cold. However upon return 03/19/2013 he started noticing insidious onset of cough that was dry critically worse at night without any clear-cut aggravating or relieving factors but there was associated postnasal drainage. He denies active acid reflux but is known to eat spicy food. He did try symptom relief through his primary care physician along with an antibiotic or to course and sinus drainage treatment. With this cough is improved 50%. Currently rates cough as mild to moderate. He says that he's not very concerned about the cough but his wife and daughter are concerned about  the cough. Therefore he presents today. RSI cough score is 9.5 and reflects MILD cough burden only  Of note, he also stated that after he saw me 2012 and sometime after the mitral valve surgery he did visit [facility] for above problems it appears that he has chronic hoarseness of voice for the last 12-15  years that is independent of the cough the cough can make it worse. Apparently he did have an extensive esophageal workup. Apparently was advised to try singing  In addition wife and daughter report dyspnea. He absolutely denies dyspnea. He says that this is a complaint only for his family. He does treadmill at 2.6 miles an hour at a 13% incline for 30 minutes and does not notice dyspnea but he says that he cannot exert beyond that due to fatigue. Wife reports that she notices him to have noisy breathing this may the snoring no wheeze and this happens intermittently. His daughter has mentioned that his breathing is "shallow". One of his daughters who is a Database administrator did express concerns whether he might have arotic valve problem as well.  Son-in-law is wondering if he has CHF because he has noticed him to be better when he sleeps in an incline. He is followed up regularly with his cardiologist Dr. Einar Gip but I do not know when last echo was  In addition he is noted to be on 07/01/2013 there were some fever chills. Primary Care physician diagnosed him with urinary tract infection and put him on 3 days of ciprofloxacin. This helped resolve his symptoms but he still has some low backache.  Of note, he is on carvedilol for his cardiac issues   Dr Lorenza Cambridge Reflux Symptom Index (> 13-15 suggestive of LPR cough)  07/08/2013  Hoarseness of problem with voice 3.5  Clearing  Of Throat 3  Excess throat mucus or feeling of post nasal drip 1  Difficulty swallowing food, liquid or tablets 0  Cough after eating or lying down 0  Breathing difficulties or choking episodes 0  Troublesome or annoying cough 1  Sensation of something sticking in throat or lump in throat 0  Heartburn, chest pain, indigestion, or stomach acid coming up 1  TOTAL 9.5      Past Medical History  Diagnosis Date  . HTN (hypertension)   . GERD (gastroesophageal reflux disease)   . Osteoporosis   . CAD (coronary artery disease)    . Mitral valve prolapse      Family History  Problem Relation Age of Onset  . Heart disease Father   . Heart disease Mother      History   Social History  . Marital Status: Married    Spouse Name: N/A    Number of Children: N/A  . Years of Education: N/A   Occupational History  . retired    Social History Main Topics  . Smoking status: Never Smoker   . Smokeless tobacco: Never Used  . Alcohol Use: Yes  . Drug Use: No  . Sexual Activity: Not on file   Other Topics Concern  . Not on file   Social History Narrative  . No narrative on file     Allergies  Allergen Reactions  . Penicillins      Outpatient Prescriptions Prior to Visit  Medication Sig Dispense Refill  . aspirin 81 MG tablet Take 81 mg by mouth daily.        . calcium-vitamin D (OSCAL WITH D) 500-200 MG-UNIT per tablet Take 2 tablets by mouth daily.        . carvedilol (COREG) 6.25 MG tablet Take 6.25 mg by mouth 2 (two) times daily with a meal.        . cetirizine (ZYRTEC) 10 MG tablet Take 10 mg by mouth daily.        Marland Kitchen losartan-hydrochlorothiazide (HYZAAR) 50-12.5 MG per tablet Take 1 tablet by mouth daily.        . Multiple Vitamin (MULTIVITAMIN) capsule Take 1 capsule by mouth daily.        . rosuvastatin (CRESTOR) 10 MG tablet Take 10 mg by mouth daily.        . clopidogrel (PLAVIX) 75 MG tablet Take 75 mg by mouth daily.        Marland Kitchen Dexlansoprazole 30 MG capsule Take 30 mg by mouth daily.        Marland Kitchen ibandronate (BONIVA) 3 MG/3ML SOLN injection Inject 3 mg into the vein once. Every 3 months        No facility-administered medications prior to visit.       Review of Systems  Constitutional: Negative for fever and unexpected weight change.  HENT: Negative for congestion, dental problem, ear pain, nosebleeds, postnasal drip, rhinorrhea, sinus pressure, sneezing, sore throat and trouble swallowing.   Eyes: Negative for redness and itching.  Respiratory: Positive for cough. Negative for chest  tightness, shortness of breath and wheezing.   Cardiovascular: Negative for palpitations and leg swelling.  Gastrointestinal: Negative for nausea and vomiting.  Genitourinary: Negative for dysuria.  Musculoskeletal: Negative for joint swelling.  Skin: Negative for rash.  Neurological: Positive for dizziness. Negative for headaches.  Hematological: Does not bruise/bleed easily.  Psychiatric/Behavioral: Negative for dysphoric mood. The patient is not nervous/anxious.  Objective:   Physical Exam  Nursing note and vitals reviewed. Constitutional: He is oriented to person, place, and time. He appears well-developed and well-nourished. No distress.  HENT:  Head: Normocephalic and atraumatic.  Right Ear: External ear normal.  Left Ear: External ear normal.  Mouth/Throat: Oropharynx is clear and moist. No oropharyngeal exudate.  Eyes: Conjunctivae and EOM are normal. Pupils are equal, round, and reactive to light. Right eye exhibits no discharge. Left eye exhibits no discharge. No scleral icterus.  Neck: Normal range of motion. Neck supple. No JVD present. No tracheal deviation present. No thyromegaly present.  Cardiovascular: Normal rate, regular rhythm and intact distal pulses.  Exam reveals no gallop and no friction rub.   Murmur heard. Soft 2/6 esm in aotic area  Pulmonary/Chest: Effort normal and breath sounds normal. No respiratory distress. He has no wheezes. He has no rales. He exhibits no tenderness.  Abdominal: Soft. Bowel sounds are normal. He exhibits no distension and no mass. There is no tenderness. There is no rebound and no guarding.  Musculoskeletal: Normal range of motion. He exhibits no edema and no tenderness.  Lymphadenopathy:    He has no cervical adenopathy.  Neurological: He is alert and oriented to person, place, and time. He has normal reflexes. No cranial nerve deficit. Coordination normal.  Skin: Skin is warm and dry. No rash noted. He is not diaphoretic. No  erythema. No pallor.  Psychiatric: He has a normal mood and affect. His behavior is normal. Judgment and thought content normal.          Assessment & Plan:

## 2013-07-08 NOTE — Patient Instructions (Addendum)
#  Chronic Cough  - unclear cause  - do full PFT test next 1-2 days - do High Resolution CT chest without contrast on ILD protocol. Only  Dr Lorin Picket or Dr. Vinnie Langton to read - depending on results might need  Empiric asthma Rx +/- allergy workup and exhaled NO through Dr Remus Blake who is a personal friend of his daughter - depending on results, might have to consider dc carvedilol esp if asthma is in differential diagnosis  - will update Dr Einar Gip and Dr Maudie Mercury  #and presumed shortness of breath with paralyzed diaphragm - reassess after cough workup - reassess  With Dr Einar Gip current cardiac status - mioght need CPST test depending on outcome of above  #recent UTI  - follow with Dr Maudie Mercury  #followup  - Tuesday 07/13/13 to followup with results, along with your wife

## 2013-07-13 ENCOUNTER — Ambulatory Visit (INDEPENDENT_AMBULATORY_CARE_PROVIDER_SITE_OTHER): Payer: Medicare Other | Admitting: Internal Medicine

## 2013-07-13 ENCOUNTER — Ambulatory Visit (INDEPENDENT_AMBULATORY_CARE_PROVIDER_SITE_OTHER)
Admission: RE | Admit: 2013-07-13 | Discharge: 2013-07-13 | Disposition: A | Discharge status: Home / Self Care | Payer: Medicare Other | Source: Ambulatory Visit | Attending: Internal Medicine | Admitting: Internal Medicine

## 2013-07-13 ENCOUNTER — Encounter: Payer: Self-pay | Admitting: Internal Medicine

## 2013-07-13 ENCOUNTER — Encounter (INDEPENDENT_AMBULATORY_CARE_PROVIDER_SITE_OTHER): Payer: Self-pay

## 2013-07-13 VITALS — BP 124/70 | HR 70 | Ht 67.5 in | Wt 165.0 lb

## 2013-07-13 DIAGNOSIS — R0689 Other abnormalities of breathing: Secondary | ICD-10-CM

## 2013-07-13 DIAGNOSIS — R0989 Other specified symptoms and signs involving the circulatory and respiratory systems: Secondary | ICD-10-CM

## 2013-07-13 DIAGNOSIS — R059 Cough, unspecified: Secondary | ICD-10-CM

## 2013-07-13 DIAGNOSIS — R05 Cough: Secondary | ICD-10-CM

## 2013-07-13 DIAGNOSIS — R0600 Dyspnea, unspecified: Secondary | ICD-10-CM

## 2013-07-13 DIAGNOSIS — R0609 Other forms of dyspnea: Secondary | ICD-10-CM

## 2013-07-13 NOTE — Assessment & Plan Note (Signed)
#  Chronic Cough  - likely post viral reactive, or occult asthma, or GERD/sinus related (likes spicy food) and all resulting in LPR/irritable larynx syndrome. His hx of ging to voice clinic at [facility] suggests above. However, need to rule out ILD because 3 y ears ago never had a chance to fully workuo his cough (underwent MVReplacement)  PLAN - do full PFT test next 1-2 days - do High Resolution CT chest without contrast on ILD protocol. Only  Dr Lorin Picket or Dr. Vinnie Langton to read - depending on results might need  Empiric asthma Rx +/- allergy workup and exhaled NO through Dr Remus Blake who is a personal friend of his daughter - will update Dr Einar Gip and Dr Maudie Mercury

## 2013-07-13 NOTE — Assessment & Plan Note (Signed)
Seems to have ? New soft systolic Aortic ejection systolic murmur. His eldest daughter is concerned about aortic valve stenosis. Have asked Dr Einar Gip to reassess

## 2013-07-13 NOTE — Patient Instructions (Addendum)
I will get hold of DR Remus Blake - get exhaled nitric oxide Will do CPST bike test trhough Mr Cheri [facility] Keep up with Dr Einar Gip and make sure not heart disease  Depending on above, altitude travel advice

## 2013-07-13 NOTE — Assessment & Plan Note (Signed)
 #   presumed shortness of breath with paralyzed diaphragm - do not know how real this problem of dyspnea is. Not a problem for him. For him is only fatigue. For family is dyspnea but I am not getting a definite pattern to his dyspnea  PLAN - reassess after cough workup - reassess  With Dr Einar Gip current cardiac status - mioght need CPST test depending on outcome of above  #followup  - Tuesday 07/13/13 to followup with results, along with your wife

## 2013-07-13 NOTE — Progress Notes (Signed)
Subjective:    Patient ID: [name], male    DOB: [birth year], 73 y.o.   MRN: [id number]  HPI   Chief Complaint  Patient presents with  . Follow-up    Pt states x 3 months he has been having a dry cough, and runny nose in the mornings.     73 year old [place] male. 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 Settled in [place] for over 30 years. Retired from [employer]. CHronic right diaph elevation - noticed nov 2011 on sniff test    Last seen in the setting of post viral reactive cough after 2 to [place] in 2012. At that time mitral valve regurgitation was diagnosed on physical exam along with chronic paralyzed right hemidiaphragm preop and he status post mitral valve replacement by Dr. Dagoberto Reef in 2012. Subsequent to the surgery he says that is chronic cough resolved or perhaps he's had mild chronic cough at night on some days. He is overall been doing well. In November 2014 he undertook a trip to [place] which was fairly uneventful except for the fact that he tripped and fell on the street and bruised his nose and forehead. Of note, prior to the trip to [place] he did have a bit of a cold. However upon return 03/19/2013 he started noticing insidious onset of cough that was dry critically worse at night without any clear-cut aggravating or relieving factors but there was associated postnasal drainage. He denies active acid reflux but is known to eat spicy food. He did try symptom relief through his primary care physician along with an antibiotic or to course and sinus drainage treatment. With this cough is improved 50%. Currently rates cough as mild to moderate. He says that he's not very concerned about the cough but his wife and daughter are concerned about  the cough. Therefore he presents today. RSI cough score is 9.5 and reflects MILD cough burden only  Of note, he also stated that after he saw me 2012 and sometime after the mitral valve surgery he did visit [facility] for above problems it  appears that he has chronic hoarseness of voice for the last 12-15 years that is independent of the cough the cough can make it worse. Apparently he did have an extensive esophageal workup. Apparently was advised to try singing  In addition wife and daughter report dyspnea. He absolutely denies dyspnea. He says that this is a complaint only for his family. He does treadmill at 2.6 miles an hour at a 13% incline for 30 minutes and does not notice dyspnea but he says that he cannot exert beyond that due to fatigue. Wife reports that she notices him to have noisy breathing this may the snoring no wheeze and this happens intermittently. His daughter has mentioned that his breathing is "shallow". One of his daughters who is a Database administrator did express concerns whether he might have arotic valve problem as well.  Son-in-law is wondering if he has CHF because he has noticed him to be better when he sleeps in an incline. He is followed up regularly with his cardiologist Dr. Einar Gip but I do not know when last echo was  In addition he is noted to be on 07/01/2013 there were some fever chills. Primary Care physician diagnosed him with urinary tract infection and put him on 3 days of ciprofloxacin. This helped resolve his symptoms but he still has some low backache.  Of note, he is on carvedilol for his cardiac issues   #Chronic Cough  -  unclear cause  - do full PFT test next 1-2 days - do High Resolution CT chest without contrast on ILD protocol. Only  Dr Lorin Picket or Dr. Vinnie Langton to read - depending on results might need  Empiric asthma Rx +/- allergy workup and exhaled NO through Dr Remus Blake who is a personal friend of his daughter - depending on results, might have to consider dc carvedilol esp if asthma is in differential diagnosis  - will update Dr Einar Gip and Dr Maudie Mercury  #and presumed shortness of breath with paralyzed diaphragm - reassess after cough workup - reassess  With Dr Einar Gip current  cardiac status - mioght need CPST test depending on outcome of above  #recent UTI  - follow with Dr Maudie Mercury  #high altitude trip  - will discuss at fu  #followup  - Tuesday 07/13/13 to followup with results, along with your wife   OV 07/13/2013   Chief Complaint  Patient presents with  . Follow-up    Review PFT and CT scan results.    PFTs 07/08/13: is abnormal. Unfortunately standards are caucasian standards on our machine. FVC `1.7L/43%, Fev1 1.27L/44%, Rati o 75, There is 15% BD response. TLC is also 3.56L/54%, DLCO 20.55/70%. I am thinking 70% of caucasian is what is normal for [place]. So DLCO is normal but spirometry is possibly mixed obstruction and restriction    CT chest 07/13/13 IMPRESSION:  1. No evidence of interstitial lung disease.  2. Elevated right hemidiaphragm with scarring in the adjacent right  lower lobe, unchanged from 06/25/2010.  3. Three-vessel coronary artery calcification.  4. Tiny pulmonary nodules. If the patient is at high risk for  bronchogenic carcinoma, follow-up chest CT at 1 year is recommended.  If the patient is at low risk, no follow-up is needed. This  recommendation follows the consensus statement: Guidelines for  Management of Small Pulmonary Nodules Detected on CT Scans: A  Statement from the [employer] as published in Radiology  2005; 237:395-400.  Electronically Signed  By: Lorin Picket M.D.  On: 07/16/2013 11:37     Dr Lorenza Cambridge Reflux Symptom Index (> 13-15 suggestive of LPR cough)  07/08/2013   Hoarseness of problem with voice 3.5  Clearing  Of Throat 3  Excess throat mucus or feeling of post nasal drip 1  Difficulty swallowing food, liquid or tablets 0  Cough after eating or lying down 0  Breathing difficulties or choking episodes 0  Troublesome or annoying cough 1  Sensation of something sticking in throat or lump in throat 0  Heartburn, chest pain, indigestion, or stomach acid coming up 1  TOTAL 9.5       Review of Systems  Constitutional: Negative for fever and unexpected weight change.  HENT: Negative for congestion, dental problem, ear pain, nosebleeds, postnasal drip, rhinorrhea, sinus pressure, sneezing, sore throat and trouble swallowing.   Eyes: Negative for redness and itching.  Respiratory: Positive for cough. Negative for chest tightness, shortness of breath and wheezing.   Cardiovascular: Negative for palpitations and leg swelling.  Gastrointestinal: Negative for nausea and vomiting.  Genitourinary: Negative for dysuria.  Musculoskeletal: Negative for joint swelling.  Skin: Negative for rash.  Neurological: Negative for headaches.  Hematological: Does not bruise/bleed easily.  Psychiatric/Behavioral: Negative for dysphoric mood. The patient is not nervous/anxious.        Objective:   Physical Exam   Discussion only visit  Filed Vitals:   07/13/13 1604 07/13/13 1613  BP:  124/70  Pulse:  70  Height: 5' 7.5" (1.715 m)   Weight: 74.844 kg (165 lb)   SpO2:  98%        Assessment & Plan:

## 2013-07-14 ENCOUNTER — Telehealth: Payer: Self-pay | Admitting: Internal Medicine

## 2013-07-14 NOTE — Telephone Encounter (Signed)
lmtcb x1 

## 2013-07-15 ENCOUNTER — Ambulatory Visit (HOSPITAL_COMMUNITY): Payer: Medicare Other

## 2013-07-15 ENCOUNTER — Encounter (HOSPITAL_COMMUNITY): Payer: Self-pay | Admitting: Internal Medicine

## 2013-07-15 ENCOUNTER — Encounter (HOSPITAL_COMMUNITY): Payer: Self-pay

## 2013-07-15 ENCOUNTER — Inpatient Hospital Stay (HOSPITAL_COMMUNITY)
Admission: AD | Admit: 2013-07-15 | Discharge: 2013-07-18 | DRG: 689 | Disposition: A | Discharge status: Home / Self Care | Payer: Medicare Other | Source: Ambulatory Visit | Attending: Internal Medicine | Admitting: Internal Medicine

## 2013-07-15 DIAGNOSIS — N179 Acute kidney failure, unspecified: Secondary | ICD-10-CM

## 2013-07-15 DIAGNOSIS — E785 Hyperlipidemia, unspecified: Secondary | ICD-10-CM | POA: Diagnosis present

## 2013-07-15 DIAGNOSIS — Z7982 Long term (current) use of aspirin: Secondary | ICD-10-CM

## 2013-07-15 DIAGNOSIS — M810 Age-related osteoporosis without current pathological fracture: Secondary | ICD-10-CM | POA: Diagnosis present

## 2013-07-15 DIAGNOSIS — Z880 Allergy status to penicillin: Secondary | ICD-10-CM

## 2013-07-15 DIAGNOSIS — J986 Disorders of diaphragm: Secondary | ICD-10-CM

## 2013-07-15 DIAGNOSIS — N390 Urinary tract infection, site not specified: Principal | ICD-10-CM | POA: Diagnosis present

## 2013-07-15 DIAGNOSIS — A419 Sepsis, unspecified organism: Secondary | ICD-10-CM

## 2013-07-15 DIAGNOSIS — I341 Nonrheumatic mitral (valve) prolapse: Secondary | ICD-10-CM

## 2013-07-15 DIAGNOSIS — Z9861 Coronary angioplasty status: Secondary | ICD-10-CM

## 2013-07-15 DIAGNOSIS — R05 Cough: Secondary | ICD-10-CM

## 2013-07-15 DIAGNOSIS — I10 Essential (primary) hypertension: Secondary | ICD-10-CM

## 2013-07-15 DIAGNOSIS — R0689 Other abnormalities of breathing: Principal | ICD-10-CM

## 2013-07-15 DIAGNOSIS — I959 Hypotension, unspecified: Secondary | ICD-10-CM | POA: Diagnosis present

## 2013-07-15 DIAGNOSIS — K219 Gastro-esophageal reflux disease without esophagitis: Secondary | ICD-10-CM | POA: Diagnosis present

## 2013-07-15 DIAGNOSIS — R059 Cough, unspecified: Secondary | ICD-10-CM

## 2013-07-15 DIAGNOSIS — D72829 Elevated white blood cell count, unspecified: Secondary | ICD-10-CM | POA: Diagnosis present

## 2013-07-15 DIAGNOSIS — I059 Rheumatic mitral valve disease, unspecified: Secondary | ICD-10-CM | POA: Diagnosis present

## 2013-07-15 DIAGNOSIS — E860 Dehydration: Secondary | ICD-10-CM | POA: Diagnosis present

## 2013-07-15 DIAGNOSIS — Z79899 Other long term (current) drug therapy: Secondary | ICD-10-CM

## 2013-07-15 DIAGNOSIS — I2510 Atherosclerotic heart disease of native coronary artery without angina pectoris: Secondary | ICD-10-CM | POA: Diagnosis present

## 2013-07-15 DIAGNOSIS — N400 Enlarged prostate without lower urinary tract symptoms: Secondary | ICD-10-CM | POA: Diagnosis present

## 2013-07-15 DIAGNOSIS — Z8673 Personal history of transient ischemic attack (TIA), and cerebral infarction without residual deficits: Secondary | ICD-10-CM

## 2013-07-15 DIAGNOSIS — R0600 Dyspnea, unspecified: Secondary | ICD-10-CM

## 2013-07-15 HISTORY — DX: Urinary tract infection, site not specified: N39.0

## 2013-07-15 HISTORY — DX: Transient cerebral ischemic attack, unspecified: G45.9

## 2013-07-15 LAB — CBC
HCT: 37.3 % — ABNORMAL LOW (ref 39.0–52.0)
Hemoglobin: 13 g/dL (ref 13.0–17.0)
MCH: 32.2 pg (ref 26.0–34.0)
MCHC: 34.9 g/dL (ref 30.0–36.0)
MCV: 92.3 fL (ref 78.0–100.0)
Platelets: 281 10*3/uL (ref 150–400)
RBC: 4.04 MIL/uL — ABNORMAL LOW (ref 4.22–5.81)
RDW: 13.3 % (ref 11.5–15.5)
WBC: 22.4 10*3/uL — ABNORMAL HIGH (ref 4.0–10.5)

## 2013-07-15 LAB — COMPREHENSIVE METABOLIC PANEL
ALT: 24 U/L (ref 0–53)
AST: 27 U/L (ref 0–37)
Albumin: 3 g/dL — ABNORMAL LOW (ref 3.5–5.2)
Alkaline Phosphatase: 80 U/L (ref 39–117)
BUN: 31 mg/dL — ABNORMAL HIGH (ref 6–23)
CO2: 25 mEq/L (ref 19–32)
Calcium: 9 mg/dL (ref 8.4–10.5)
Chloride: 97 mEq/L (ref 96–112)
Creatinine, Ser: 1.71 mg/dL — ABNORMAL HIGH (ref 0.50–1.35)
GFR calc Af Amer: 44 mL/min — ABNORMAL LOW (ref >90)
GFR calc non Af Amer: 38 mL/min — ABNORMAL LOW (ref >90)
Glucose, Bld: 115 mg/dL — ABNORMAL HIGH (ref 70–99)
Potassium: 3.9 mEq/L (ref 3.7–5.3)
Sodium: 136 mEq/L — ABNORMAL LOW (ref 137–147)
Total Bilirubin: 0.7 mg/dL (ref 0.3–1.2)
Total Protein: 7.2 g/dL (ref 6.0–8.3)

## 2013-07-15 LAB — TROPONIN I: Troponin I: <0.3 ng/mL (ref <0.30)

## 2013-07-15 LAB — URINALYSIS, ROUTINE W REFLEX MICROSCOPIC
Bilirubin Urine: NEGATIVE
Glucose, UA: NEGATIVE mg/dL
Ketones, ur: NEGATIVE mg/dL
Nitrite: NEGATIVE
Protein, ur: NEGATIVE mg/dL
Specific Gravity, Urine: 1.013 (ref 1.005–1.030)
Urobilinogen, UA: 0.2 mg/dL (ref 0.0–1.0)
pH: 5 (ref 5.0–8.0)

## 2013-07-15 LAB — URINE MICROSCOPIC-ADD ON

## 2013-07-15 MED ORDER — ACETAMINOPHEN 650 MG RE SUPP: 650.0000 mg | Freq: Four times a day (QID) | RECTAL | Status: DC | PRN

## 2013-07-15 MED ORDER — ONDANSETRON HCL 4 MG PO TABS: 4.0000 mg | ORAL_TABLET | Freq: Four times a day (QID) | ORAL | Status: DC | PRN

## 2013-07-15 MED ORDER — ONDANSETRON HCL 4 MG/2ML IJ SOLN: 4.0000 mg | Freq: Four times a day (QID) | INTRAMUSCULAR | Status: DC | PRN

## 2013-07-15 MED ORDER — DEXTROSE 5 % IV SOLN
1.0000 g | INTRAVENOUS | Status: DC
  Administered 2013-07-15 – 2013-07-16 (×2): 1 g via INTRAVENOUS
  Filled 2013-07-15 (×2): qty 10

## 2013-07-15 MED ORDER — CARVEDILOL 6.25 MG PO TABS
6.2500 mg | ORAL_TABLET | Freq: Two times a day (BID) | ORAL | Status: DC
  Filled 2013-07-15 (×2): qty 1

## 2013-07-15 MED ORDER — SODIUM CHLORIDE 0.9 % IV SOLN
INTRAVENOUS | Status: AC
  Administered 2013-07-15 – 2013-07-16 (×2): via INTRAVENOUS

## 2013-07-15 MED ORDER — ACETAMINOPHEN 325 MG PO TABS
650.0000 mg | ORAL_TABLET | Freq: Four times a day (QID) | ORAL | Status: DC | PRN
  Administered 2013-07-15: 650 mg via ORAL
  Filled 2013-07-15: qty 2

## 2013-07-15 MED ORDER — ASPIRIN EC 81 MG PO TBEC
81.0000 mg | DELAYED_RELEASE_TABLET | Freq: Every day | ORAL | Status: DC
  Administered 2013-07-16 – 2013-07-18 (×3): 81 mg via ORAL
  Filled 2013-07-15 (×3): qty 1

## 2013-07-15 MED ORDER — ENOXAPARIN SODIUM 30 MG/0.3ML ~~LOC~~ SOLN
30.0000 mg | SUBCUTANEOUS | Status: DC
  Administered 2013-07-16: 30 mg via SUBCUTANEOUS
  Filled 2013-07-15 (×2): qty 0.3

## 2013-07-15 MED ORDER — ATORVASTATIN CALCIUM 10 MG PO TABS
10.0000 mg | ORAL_TABLET | Freq: Every day | ORAL | Status: DC
  Administered 2013-07-16 – 2013-07-17 (×2): 10 mg via ORAL
  Filled 2013-07-15 (×3): qty 1

## 2013-07-15 MED ORDER — ENOXAPARIN SODIUM 40 MG/0.4ML ~~LOC~~ SOLN
40.0000 mg | SUBCUTANEOUS | Status: DC
  Administered 2013-07-15: 40 mg via SUBCUTANEOUS
  Filled 2013-07-15: qty 0.4

## 2013-07-15 NOTE — H&P (Signed)
[name] is an 73 y.o. male.   Chief Complaint: uti HPI: 73 yo male with c/o discoloration of urine , and malaise.  Pt had prior hx of chills til treated with cipro, and felt somewhat better after treatment. Repeat ua showed evidence of uti.  Pt denies fever, chills, cp, palp, sob, n/v, diarrhea , brbpr, black stool, flank pain. Pt had wbc 24 and sent to hospital for admission due to mild hypotension.     Past Medical History  Diagnosis Date  . HTN (hypertension)   . GERD (gastroesophageal reflux disease)   . Osteoporosis   . CAD (coronary artery disease)   . Mitral valve prolapse   . TIA (transient ischemic attack)   . UTI (lower urinary tract infection)     Past Surgical History  Procedure Laterality Date  . Appendectomy    . Coronary angioplasty with stent placement  04/30/2010    PCI and stenting of proximal RCA  . Mitral valve repair  06/13/2010    right mini thoracotomy for complex valvuloplasty with 7m Memo ring annuloplasty - Dr. ORoxy Manns   Family History  Problem Relation Age of Onset  . Heart disease Father   . Heart disease Mother    Social History:  reports that he has never smoked. He has never used smokeless tobacco. He reports that he drinks alcohol. He reports that he does not use illicit drugs.  Allergies:  Allergies  Allergen Reactions  . Penicillins Itching and Rash    Medications Prior to Admission  Medication Sig Dispense Refill  . aspirin 81 MG tablet Take 81 mg by mouth daily.        . calcium-vitamin D (OSCAL WITH D) 500-200 MG-UNIT per tablet Take 2 tablets by mouth daily.        . carvedilol (COREG) 6.25 MG tablet Take 6.25 mg by mouth 2 (two) times daily with a meal.        . cetirizine (ZYRTEC) 10 MG tablet Take 10 mg by mouth daily.        . ciprofloxacin (CIPRO) 500 MG tablet Take 500 mg by mouth 2 (two) times daily. For 10 days. Finished on 07-13-13      . losartan-hydrochlorothiazide (HYZAAR) 50-12.5 MG per tablet Take 1 tablet by  mouth daily.        . Multiple Vitamin (MULTIVITAMIN) capsule Take 1 capsule by mouth daily.        . rosuvastatin (CRESTOR) 10 MG tablet Take 10 mg by mouth daily.          Results for orders placed during the hospital encounter of 07/15/13 (from the past 48 hour(s))  URINALYSIS, ROUTINE W REFLEX MICROSCOPIC     Status: Abnormal   Collection Time    07/15/13  9:07 PM      Result Value Ref Range   Color, Urine YELLOW  YELLOW   APPearance CLOUDY (*) CLEAR   Specific Gravity, Urine 1.013  1.005 - 1.030   pH 5.0  5.0 - 8.0   Glucose, UA NEGATIVE  NEGATIVE mg/dL   Hgb urine dipstick SMALL (*) NEGATIVE   Bilirubin Urine NEGATIVE  NEGATIVE   Ketones, ur NEGATIVE  NEGATIVE mg/dL   Protein, ur NEGATIVE  NEGATIVE mg/dL   Urobilinogen, UA 0.2  0.0 - 1.0 mg/dL   Nitrite NEGATIVE  NEGATIVE   Leukocytes, UA MODERATE (*) NEGATIVE  URINE MICROSCOPIC-ADD ON     Status: Abnormal   Collection Time    07/15/13  9:07  PM      Result Value Ref Range   Squamous Epithelial / LPF RARE  RARE   WBC, UA 21-50  <3 WBC/hpf   RBC / HPF 0-2  <3 RBC/hpf   Bacteria, UA MANY (*) RARE  CBC     Status: Abnormal   Collection Time    07/15/13  9:23 PM      Result Value Ref Range   WBC 22.4 (*) 4.0 - 10.5 K/uL   RBC 4.04 (*) 4.22 - 5.81 MIL/uL   Hemoglobin 13.0  13.0 - 17.0 g/dL   HCT 37.3 (*) 39.0 - 52.0 %   MCV 92.3  78.0 - 100.0 fL   MCH 32.2  26.0 - 34.0 pg   MCHC 34.9  30.0 - 36.0 g/dL   RDW 13.3  11.5 - 15.5 %   Platelets 281  150 - 400 K/uL  TROPONIN I     Status: None   Collection Time    07/15/13  9:23 PM      Result Value Ref Range   Troponin I <0.30  <0.30 ng/mL   Comment:            Due to the release kinetics of cTnI,     a negative result within the first hours     of the onset of symptoms does not rule out     myocardial infarction with certainty.     If myocardial infarction is still suspected,     repeat the test at appropriate intervals.  COMPREHENSIVE METABOLIC PANEL     Status:  Abnormal   Collection Time    07/15/13  9:23 PM      Result Value Ref Range   Sodium 136 (*) 137 - 147 mEq/L   Potassium 3.9  3.7 - 5.3 mEq/L   Chloride 97  96 - 112 mEq/L   CO2 25  19 - 32 mEq/L   Glucose, Bld 115 (*) 70 - 99 mg/dL   BUN 31 (*) 6 - 23 mg/dL   Creatinine, Ser 1.71 (*) 0.50 - 1.35 mg/dL   Calcium 9.0  8.4 - 10.5 mg/dL   Total Protein 7.2  6.0 - 8.3 g/dL   Albumin 3.0 (*) 3.5 - 5.2 g/dL   AST 27  0 - 37 U/L   Comment: HEMOLYSIS AT THIS LEVEL MAY AFFECT RESULT   ALT 24  0 - 53 U/L   Alkaline Phosphatase 80  39 - 117 U/L   Total Bilirubin 0.7  0.3 - 1.2 mg/dL   GFR calc non Af Amer 38 (*) >90 mL/min   GFR calc Af Amer 44 (*) >90 mL/min   Comment: (NOTE)     The eGFR has been calculated using the CKD EPI equation.     This calculation has not been validated in all clinical situations.     eGFR's persistently <90 mL/min signify possible Chronic Kidney     Disease.   No results found.  ROS negative for all organ systems except for + above  Blood pressure 105/57, pulse 70, temperature 98.6 F (37 C), temperature source Oral, resp. rate 17, SpO2 100.00%. Physical Exam  Heent: anciteric Neck: no jvd Heart: rrr s1, s2 Lung: ctab Abd: soft, nt, nd, +bs Ext: no c/c/e Skin: no rash, no cva tenderness  Assessment/Plan Uti:  tx with rocephin, await cultures ARF:  Likely secondary to dehydration.  Hold losartan/hctz.  NS iv Check urine sodium , urine creatinine, urine eosinophils Consider renal u/s if  not improving Hypertension: hold losartan/hctz.  Cont carvedilol Hyperlipidemia:  Cont crestor.  DVT prophylaxis lovenox  [name] 07/15/2013, 10:53 PM

## 2013-07-15 NOTE — Telephone Encounter (Signed)
Spoke with callers daughter-need more information of what she is wanting on patient. Daughter to caller informed me that the patient was admitted to the hospital today after not being able to complete the stress test MR wanted him to have. MR was already aware of this. Caller to call back as she is able to inform us of what records she is needing.

## 2013-07-15 NOTE — Progress Notes (Addendum)
[name] here today for cardiopulmonary exercise test. After performing baseline pulmonary function tests, the patient was prepped for exercise test, including 12 lead ECG, BP cuff and pulse-oximetry. ECG was noted to have inferolater ST elevation that was felt to be earl repolarization, there was no previous ECG found in [facility] to compare (Primary cardiologist Dr. Einar Gip). Resting BP was noted to be 86/50 on left arm, which is unusually for him. Patient reported no symptoms and said he felt better then when he saw Dr.  Caller on 3/31 when BP was 124/70. Dr.  Caller was called to discuss patient and felt the patient should go to ED, but also thought Dr. Einar Gip should be contacted about ECG. Discussed Dr. Golden Pop recommendation with patient and the patient did not feel like he needed to go to the ED. The patient wanted to know what Dr. Einar Gip had to say. I called Dr. Einar Gip and sent him an image of the ECG. Dr. Einar Gip confirmed the ST elevation was early repolarization and consistent with previous ECGs. I discussed with Dr. Einar Gip the recommendation from Dr.  Caller to go to the ED. Dr. Einar Gip wanted patient to stop Hyzaar and hold this evening's carvedilol (continue carvedilol as normal in the morning). Dr. Einar Gip felt that the patient did not need to go to ED and that he would follow-up with patient this evening. Again, this was discussed with the patient and his wife. The patient was resolute with not going to ED. Patient and wife given medication change instructions and left.   While patient was waiting for his car to be brought [facility], his primary care physician called to notify him that a recent urinalysis positive for continued UTI. Primary care physician (Dr. Maudie Mercury) wanted him to proceed to [facility] to be admitted for IV antibiotics. Patient informed Dr. Maudie Mercury that he was at the hospital already. At the time of this note, [name] was waiting instructions of where  to proceed from Dr. Maudie Mercury. Both Drs.  Caller and Ganji were notified of this information.  Resting ECG from canceled CPX test was posted as an attachment to the CPX procedure.

## 2013-07-16 ENCOUNTER — Inpatient Hospital Stay (HOSPITAL_COMMUNITY): Payer: Medicare Other

## 2013-07-16 DIAGNOSIS — I10 Essential (primary) hypertension: Secondary | ICD-10-CM

## 2013-07-16 DIAGNOSIS — R05 Cough: Secondary | ICD-10-CM

## 2013-07-16 DIAGNOSIS — R059 Cough, unspecified: Secondary | ICD-10-CM

## 2013-07-16 DIAGNOSIS — A419 Sepsis, unspecified organism: Secondary | ICD-10-CM

## 2013-07-16 DIAGNOSIS — N179 Acute kidney failure, unspecified: Secondary | ICD-10-CM

## 2013-07-16 DIAGNOSIS — J986 Disorders of diaphragm: Secondary | ICD-10-CM

## 2013-07-16 DIAGNOSIS — N390 Urinary tract infection, site not specified: Secondary | ICD-10-CM | POA: Diagnosis present

## 2013-07-16 DIAGNOSIS — I959 Hypotension, unspecified: Secondary | ICD-10-CM

## 2013-07-16 LAB — CBC WITH DIFFERENTIAL/PLATELET
Basophils Absolute: 0 10*3/uL (ref 0.0–0.1)
Basophils Relative: 0 % (ref 0–1)
Eosinophils Absolute: 0 10*3/uL (ref 0.0–0.7)
Eosinophils Relative: 0 % (ref 0–5)
HCT: 37 % — ABNORMAL LOW (ref 39.0–52.0)
Hemoglobin: 13 g/dL (ref 13.0–17.0)
Lymphocytes Relative: 1 % — ABNORMAL LOW (ref 12–46)
Lymphs Abs: 0.3 10*3/uL — ABNORMAL LOW (ref 0.7–4.0)
MCH: 32 pg (ref 26.0–34.0)
MCHC: 35.1 g/dL (ref 30.0–36.0)
MCV: 91.1 fL (ref 78.0–100.0)
Monocytes Absolute: 0.6 10*3/uL (ref 0.1–1.0)
Monocytes Relative: 3 % (ref 3–12)
Neutro Abs: 23.1 10*3/uL — ABNORMAL HIGH (ref 1.7–7.7)
Neutrophils Relative %: 96 % — ABNORMAL HIGH (ref 43–77)
Platelets: 239 10*3/uL (ref 150–400)
RBC: 4.06 MIL/uL — ABNORMAL LOW (ref 4.22–5.81)
RDW: 13.3 % (ref 11.5–15.5)
WBC: 24.1 10*3/uL — ABNORMAL HIGH (ref 4.0–10.5)

## 2013-07-16 LAB — BASIC METABOLIC PANEL
BUN: 22 mg/dL (ref 6–23)
CO2: 21 mEq/L (ref 19–32)
Calcium: 7.6 mg/dL — ABNORMAL LOW (ref 8.4–10.5)
Chloride: 104 mEq/L (ref 96–112)
Creatinine, Ser: 1.36 mg/dL — ABNORMAL HIGH (ref 0.50–1.35)
GFR calc Af Amer: 58 mL/min — ABNORMAL LOW (ref >90)
GFR calc non Af Amer: 50 mL/min — ABNORMAL LOW (ref >90)
Glucose, Bld: 105 mg/dL — ABNORMAL HIGH (ref 70–99)
Potassium: 4 mEq/L (ref 3.7–5.3)
Sodium: 138 mEq/L (ref 137–147)

## 2013-07-16 LAB — COMPREHENSIVE METABOLIC PANEL
ALT: 22 U/L (ref 0–53)
AST: 23 U/L (ref 0–37)
Albumin: 2.8 g/dL — ABNORMAL LOW (ref 3.5–5.2)
Alkaline Phosphatase: 78 U/L (ref 39–117)
BUN: 30 mg/dL — ABNORMAL HIGH (ref 6–23)
CO2: 21 mEq/L (ref 19–32)
Calcium: 8.5 mg/dL (ref 8.4–10.5)
Chloride: 100 mEq/L (ref 96–112)
Creatinine, Ser: 1.73 mg/dL — ABNORMAL HIGH (ref 0.50–1.35)
GFR calc Af Amer: 44 mL/min — ABNORMAL LOW (ref >90)
GFR calc non Af Amer: 38 mL/min — ABNORMAL LOW (ref >90)
Glucose, Bld: 95 mg/dL (ref 70–99)
Potassium: 3.6 mEq/L — ABNORMAL LOW (ref 3.7–5.3)
Sodium: 136 mEq/L — ABNORMAL LOW (ref 137–147)
Total Bilirubin: 0.7 mg/dL (ref 0.3–1.2)
Total Protein: 6.7 g/dL (ref 6.0–8.3)

## 2013-07-16 LAB — LACTIC ACID, PLASMA: Lactic Acid, Venous: 1 mmol/L (ref 0.5–2.2)

## 2013-07-16 LAB — PRO B NATRIURETIC PEPTIDE: Pro B Natriuretic peptide (BNP): 1117 pg/mL — ABNORMAL HIGH (ref 0–125)

## 2013-07-16 LAB — TROPONIN I: Troponin I: <0.3 ng/mL (ref <0.30)

## 2013-07-16 LAB — PROCALCITONIN: Procalcitonin: 26.26 ng/mL

## 2013-07-16 MED ORDER — VANCOMYCIN HCL IN DEXTROSE 1-5 GM/200ML-% IV SOLN
1000.0000 mg | INTRAVENOUS | Status: DC
  Administered 2013-07-16 – 2013-07-17 (×2): 1000 mg via INTRAVENOUS
  Filled 2013-07-16 (×3): qty 200

## 2013-07-16 MED ORDER — SODIUM CHLORIDE 0.9 % IV SOLN
INTRAVENOUS | Status: DC
  Administered 2013-07-17: 06:00:00 via INTRAVENOUS

## 2013-07-16 MED ORDER — SODIUM CHLORIDE 0.9 % IV BOLUS (SEPSIS)
1000.0000 mL | Freq: Once | INTRAVENOUS | Status: AC
Start: 2013-07-16 — End: 2013-07-16
  Administered 2013-07-16: 1000 mL via INTRAVENOUS

## 2013-07-16 MED ORDER — FLUTICASONE PROPIONATE 50 MCG/ACT NA SUSP
2.0000 | Freq: Every day | NASAL | Status: DC
  Administered 2013-07-16 – 2013-07-18 (×2): 2 via NASAL
  Filled 2013-07-16 (×2): qty 16

## 2013-07-16 MED ORDER — PANTOPRAZOLE SODIUM 40 MG PO TBEC
40.0000 mg | DELAYED_RELEASE_TABLET | Freq: Every day | ORAL | Status: DC
  Administered 2013-07-17 – 2013-07-18 (×2): 40 mg via ORAL
  Filled 2013-07-16 (×2): qty 1

## 2013-07-16 MED ORDER — DEXTROSE 5 % IV SOLN
1.0000 g | INTRAVENOUS | Status: DC
  Administered 2013-07-16 – 2013-07-17 (×2): 1 g via INTRAVENOUS
  Filled 2013-07-16 (×3): qty 1

## 2013-07-16 NOTE — Consult Note (Addendum)
[facility]                                                                                    Patient Demographics  [name], is a 73 y.o. male  CSN: [id number]  MRN: [id number]  DOB - [birth year]  Admit Date - 07/15/2013  Outpatient Primary MD for the patient is [name], MD   With History of -  Past Medical History  Diagnosis Date  . HTN (hypertension)   . GERD (gastroesophageal reflux disease)   . Osteoporosis   . CAD (coronary artery disease)   . Mitral valve prolapse   . TIA (transient ischemic attack)   . UTI (lower urinary tract infection)       Past Surgical History  Procedure Laterality Date  . Appendectomy    . Coronary angioplasty with stent placement  04/30/2010    PCI and stenting of proximal RCA  . Mitral valve repair  06/13/2010    right mini thoracotomy for complex valvuloplasty with 24mm Memo ring annuloplasty - Dr. Roxy Manns    in for   No chief complaint on file.    HPI  [name]  is a 73 y.o. male, with past medical history significant for hypertension osteoporosis and coronary artery disease who was recently evaluated by pulmonary for shortness of breath, presented originally to the hospital with days history of dysuria nausea and chills. The patient was diagnosed with urinary tract infection and received a 12 day course of Cipro . In the hospital the patient was started on Rocephin however his white blood cell count was 24,000 and he started becoming hypotensive. I was called to evaluate the patient. Patient denies any chest pains or shortness of breath. Reports nausea but no vomiting.    Review of Systems    In addition to the HPI above,  No Headache, No changes with Vision or hearing, No problems swallowing food or Liquids, No Chest pain, Cough or Shortness of Breath, No Abdominal pain, No  Vommitting, Bowel movements are regular, No Blood in stool or Urine, No new skin rashes or bruises, No new joints  pains-aches,  No new weakness, tingling, numbness in any extremity, No recent weight gain or loss, No polyuria, polydypsia or polyphagia, No significant Mental Stressors.  A full 10 point Review of Systems was done, except as stated above, all other Review of Systems were negative.   Social History History  Substance Use Topics  . Smoking status: Never Smoker   . Smokeless tobacco: Never Used  . Alcohol Use: Yes     Comment: SOCIAL     Family History Family History  Problem Relation Age of Onset  . Heart disease Father   . Heart disease Mother      Prior to Admission medications   Medication Sig Start Date End Date Taking? Authorizing Provider  aspirin 81 MG tablet Take 81 mg by mouth daily.     Yes Historical Provider, MD  calcium-vitamin D (OSCAL WITH D) 500-200 MG-UNIT per tablet Take 2 tablets by mouth daily.     Yes Historical Provider, MD  carvedilol (COREG) 6.25 MG tablet Take 6.25 mg by mouth  2 (two) times daily with a meal.     Yes Historical Provider, MD  cetirizine (ZYRTEC) 10 MG tablet Take 10 mg by mouth daily.     Yes Historical Provider, MD  ciprofloxacin (CIPRO) 500 MG tablet Take 500 mg by mouth 2 (two) times daily. For 10 days. Finished on 07-13-13   Yes Historical Provider, MD  losartan-hydrochlorothiazide (HYZAAR) 50-12.5 MG per tablet Take 1 tablet by mouth daily.     Yes Historical Provider, MD  Multiple Vitamin (MULTIVITAMIN) capsule Take 1 capsule by mouth daily.     Yes Historical Provider, MD  rosuvastatin (CRESTOR) 10 MG tablet Take 10 mg by mouth daily.     Yes Historical Provider, MD    Allergies  Allergen Reactions  . Penicillins Itching and Rash    Physical Exam  Vitals  Blood pressure 100/49, pulse 82, temperature 98.3 F (36.8 C), temperature source Oral, resp. rate 18, weight 74.571 kg (164 lb 6.4 oz), SpO2 100.00%.   1. General elderly [place] gentleman lying in bed in no acute distress at the moment  2. Normal affect and insight,  Not Suicidal or Homicidal, Awake Alert, Oriented X 3.  3. No F.N deficits, ALL C.Nerves Intact, Strength 5/5 all 4 extremities, Sensation intact all 4 extremities, Plantars down going.  4. Ears and Eyes appear Normal, Conjunctivae clear, PERRLA. Moist Oral Mucosa.  5. Supple Neck, No JVD, No cervical lymphadenopathy appriciated, No Carotid Bruits.  6. Symmetrical Chest wall movement, Good air movement bilaterally, CTAB.  7. RRR, No Gallops, Rubs or Murmurs, No Parasternal Heave.  8. Positive Bowel Sounds, Abdomen Soft, Non tender, No organomegaly appriciated,No rebound -guarding or rigidity.  9.  No Cyanosis, Normal Skin Turgor, No Skin Rash or Bruise.  10. Good muscle tone,  joints appear normal , no effusions, Normal ROM.  11. No Palpable Lymph Nodes in Neck or Axillae    Data Review  CBC  Recent Labs Lab 07/15/13 2123 07/16/13 0349  WBC 22.4* 24.1*  HGB 13.0 13.0  HCT 37.3* 37.0*  PLT 281 239  MCV 92.3 91.1  MCH 32.2 32.0  MCHC 34.9 35.1  RDW 13.3 13.3  LYMPHSABS  --  0.3*  MONOABS  --  0.6  EOSABS  --  0.0  BASOSABS  --  0.0   ------------------------------------------------------------------------------------------------------------------  Chemistries   Recent Labs Lab 07/15/13 2123 07/16/13 0349  NA 136* 136*  K 3.9 3.6*  CL 97 100  CO2 25 21  GLUCOSE 115* 95  BUN 31* 30*  CREATININE 1.71* 1.73*  CALCIUM 9.0 8.5  AST 27 23  ALT 24 22  ALKPHOS 80 78  BILITOT 0.7 0.7   ------------------------------------------------------------------------------------------------------------------ CrCl is unknown because both a height and weight (above a minimum accepted value) are required for this calculation. ------------------------------------------------------------------------------------------------------------------ No results found for this basename: TSH, T4TOTAL, FREET3, T3FREE, THYROIDAB,  in the last 72 hours   Coagulation profile No results  found for this basename: INR, PROTIME,  in the last 168 hours ------------------------------------------------------------------------------------------------------------------- No results found for this basename: DDIMER,  in the last 72 hours -------------------------------------------------------------------------------------------------------------------  Cardiac Enzymes  Recent Labs Lab 07/15/13 2123  TROPONINI <0.30   ------------------------------------------------------------------------------------------------------------------ No components found with this basename: POCBNP,    ---------------------------------------------------------------------------------------------------------------  Urinalysis    Component Value Date/Time   COLORURINE YELLOW 07/15/2013 2107   APPEARANCEUR CLOUDY* 07/15/2013 2107   LABSPEC 1.013 07/15/2013 2107   PHURINE 5.0 07/15/2013 2107   GLUCOSEU NEGATIVE 07/15/2013 2107   HGBUR SMALL* 07/15/2013 2107  [place] NEGATIVE 07/15/2013 2107   KETONESUR NEGATIVE 07/15/2013 2107   PROTEINUR NEGATIVE 07/15/2013 2107   UROBILINOGEN 0.2 07/15/2013 2107   NITRITE NEGATIVE 07/15/2013 2107   LEUKOCYTESUR MODERATE* 07/15/2013 2107    ----------------------------------------------------------------------------------------------------------------     Imaging results:   Ct Chest High Resolution  07/16/2013   CLINICAL DATA:  Cough.  Question interstitial lung disease.  EXAM: CHEST CT WITHOUT CONTRAST  TECHNIQUE: Multidetector CT imaging of the chest was performed following the standard protocol without intravenous contrast. High resolution imaging of the lungs, as well as inspiratory and expiratory imaging, was performed.  COMPARISON:  DG CHEST 2 VIEW dated 06/25/2010  FINDINGS: No pathologically enlarged mediastinal lymph nodes. Hilar regions are difficult to definitively evaluate without IV contrast. No axillary adenopathy. Atherosclerotic calcification of the arterial  vasculature, including three-vessel involvement of the coronary arteries. Heart size normal. No pericardial effusion.  There is scarring in the right lower lobe, adjacent to an elevated right hemidiaphragm. 3 mm right upper lobe nodule (image 12). A 4 mm nodule is seen in the left lower lobe (image 34). No subpleural reticulation, traction bronchiectasis/ bronchiolectasis, ground-glass, architectural distortion or honeycombing. No air trapping on expiratory and expiratory imaging. No pleural fluid. Airway is unremarkable.  Incidental imaging of the upper abdomen shows the visualized portions of the liver, gallbladder, adrenal glands, spleen, pancreas and stomach to be grossly unremarkable. No upper abdominal adenopathy. No worrisome lytic or sclerotic lesions. Degenerative changes are seen in the spine. Prominent Schmorl's nodes. Mild anterior wedging of mid and lower thoracic vertebral bodies, unchanged from 06/25/2010.  IMPRESSION: 1. No evidence of interstitial lung disease. 2. Elevated right hemidiaphragm with scarring in the adjacent right lower lobe, unchanged from 06/25/2010. 3. Three-vessel coronary artery calcification. 4. Tiny pulmonary nodules. If the patient is at high risk for bronchogenic carcinoma, follow-up chest CT at 1 year is recommended. If the patient is at low risk, no follow-up is needed. This recommendation follows the consensus statement: Guidelines for Management of Small Pulmonary Nodules Detected on CT Scans: A Statement from the [place] as published in Radiology 2005; 237:395-400.   Electronically Signed   By: Lorin Picket M.D.   On: 07/16/2013 11:37      Assessment & Plan  1. urinary tract infection 2. Early sepsis 3.AKI  4. History of coronary artery disease, GERD and hypertension  Plan Change Rocephin to IV vancomycin and cefepime Start IV fluids normal saline at 125 mL per hour Followup labs ordered by Dr. Chase Caller and Dr. Maudie Mercury including procalcitonin ,  blood cultures and urine culture and ultrasound of the kidneys. Check CBC and BMP in a.m. and change antibiotics when cultures are back   DVT Prophylaxis Lovenox  AM Labs Ordered, also please review Full Orders  Family Communication: Admission, patients condition and plan of care including tests being ordered have been discussed with the patient and family who indicate understanding and agree with the plan and Code Status.  Code Status full    Time spent in minutes : 36 minutes  Condition GUARDED   @SIGNATURE @

## 2013-07-16 NOTE — Progress Notes (Signed)
12:27 am  VS as ff: T=99.4 BP= 123/63  RR=22  PR=74. Patient was shivering, nauseated and mildly anxious. Tylenol given as needed for fever with relief noted. Patient admitted for UTI, WBC of 22.4, patient has pending Blood cultures.  Patient made comfortable in bed. Will continue to monitor status.  KYoung RN

## 2013-07-16 NOTE — Progress Notes (Signed)
Utilization review completed. Amely Voorheis, RN, BSN. 

## 2013-07-16 NOTE — Progress Notes (Signed)
Attempted  to reach Dr. On call( Dr. Minna Antis) thru answering service at 4:30, 4:55, 5:25,6:10, 6:40 with no return call. Attempted to call Dr. Osvaldo Angst mailbox full). Informed charge nurse of above.

## 2013-07-16 NOTE — Progress Notes (Signed)
ANTIBIOTIC CONSULT NOTE - INITIAL  Pharmacy Consult for vanc/cefepime Indication: rule out sepsis  Allergies  Allergen Reactions  . Penicillins Itching and Rash    Patient Measurements: Weight: 164 lb 6.4 oz (74.571 kg) Adjusted Body Weight:   Vital Signs: Temp: 98.3 F (36.8 C) (04/03 1305) Temp src: Oral (04/03 1305) BP: 100/49 mmHg (04/03 1305) Pulse Rate: 82 (04/03 1305) Intake/Output from previous day: 04/02 0701 - 04/03 0700 In: 360 [P.O.:360] Out: 300 [Urine:300] Intake/Output from this shift:    Labs:  Recent Labs  07/15/13 2123 07/16/13 0349  WBC 22.4* 24.1*  HGB 13.0 13.0  PLT 281 239  CREATININE 1.71* 1.73*   The CrCl is unknown because both a height and weight (above a minimum accepted value) are required for this calculation. No results found for this basename: VANCOTROUGH, VANCOPEAK, VANCORANDOM, GENTTROUGH, GENTPEAK, GENTRANDOM, TOBRATROUGH, TOBRAPEAK, TOBRARND, AMIKACINPEAK, AMIKACINTROU, AMIKACIN,  in the last 72 hours   Microbiology: No results found for this or any previous visit (from the past 720 hour(s)).  Medical History: Past Medical History  Diagnosis Date  . HTN (hypertension)   . GERD (gastroesophageal reflux disease)   . Osteoporosis   . CAD (coronary artery disease)   . Mitral valve prolapse   . TIA (transient ischemic attack)   . UTI (lower urinary tract infection)     Medications:  Prescriptions prior to admission  Medication Sig Dispense Refill  . aspirin 81 MG tablet Take 81 mg by mouth daily.        . calcium-vitamin D (OSCAL WITH D) 500-200 MG-UNIT per tablet Take 2 tablets by mouth daily.        . carvedilol (COREG) 6.25 MG tablet Take 6.25 mg by mouth 2 (two) times daily with a meal.        . cetirizine (ZYRTEC) 10 MG tablet Take 10 mg by mouth daily.        . ciprofloxacin (CIPRO) 500 MG tablet Take 500 mg by mouth 2 (two) times daily. For 10 days. Finished on 07-13-13      . losartan-hydrochlorothiazide (HYZAAR)  50-12.5 MG per tablet Take 1 tablet by mouth daily.        . Multiple Vitamin (MULTIVITAMIN) capsule Take 1 capsule by mouth daily.        . rosuvastatin (CRESTOR) 10 MG tablet Take 10 mg by mouth daily.         Scheduled:  . aspirin EC  81 mg Oral Daily  . atorvastatin  10 mg Oral q1800  . enoxaparin (LOVENOX) injection  30 mg Subcutaneous Q24H  . fluticasone  2 spray Each Nare Daily  . [START ON 07/17/2013] pantoprazole  40 mg Oral Q1200   Infusions:  . sodium chloride 100 mL/hr at 07/16/13 1801  . sodium chloride     Assessment: 73 yo who was admitted for possible sepsis from UTI. He was recently placed on cipro for UTI. Empiric vanc/cefempime here up front.   Goal of Therapy:  Vancomycin trough level 15-20 mcg/ml  Plan:   Cefepime 1g IV q24 Vanc 1g IV q24 F/u with level if needed

## 2013-07-16 NOTE — Consult Note (Signed)
PULMONARY  / CRITICAL CARE MEDICINE  Name: [name] MRN: [id number] DOB: [birth year] PCP [name], MD    ADMISSION DATE:  07/15/2013  LOS 1 days   CONSULTATION DATE:  07/16/2013   REFERRING MD :  [name], MD  PRIMARY SERVICE: [name], MD   CHIEF COMPLAINT:  UTI/low Bp  BRIEF PATIENT DESCRIPTION: see hpi  SIGNIFICANT EVENTS / STUDIES:  07/15/2013 - admit   LINES / TUBES: none  CULTURES: Results for orders placed during the hospital encounter of 06/13/10  MRSA PCR SCREENING     Status: None   Collection Time    06/13/10  5:30 PM      Result Value Ref Range Status   MRSA by PCR    NEGATIVE Final   Value: NEGATIVE            The GeneXpert MRSA Assay (FDA     approved for NASAL specimens     only), is one component of a     comprehensive MRSA colonization     surveillance program. It is not     intended to diagnose MRSA     infection nor to guide or     monitor treatment for     MRSA infections.     ANTIBIOTICS: Anti-infectives   Start     Dose/Rate Route Frequency Ordered Stop   07/15/13 1800  cefTRIAXone (ROCEPHIN) 1 g in dextrose 5 % 50 mL IVPB     1 g 100 mL/hr over 30 Minutes Intravenous Every 24 hours 07/15/13 1650         HISTORY OF PRESENT ILLNESS:    73 year old patient well known to me. See my recent office notes for all details. Reported to pulm stress test 07/15/13 but was hypotensive. Had had some dysuria 7-12 days prior and was on abx but did not have active UTI or flank pain at t time of CPST. However, pcp  Had called patient when patient was at [facility] with abnormal UUTI result ([place] not known) and admitted patient for UTI. Since admit patient gnerally fatigued, had one episode of fever and chills last night. BP running soft and creat high (new). No change in chronic cough. PCCM consulted.   PAST MEDICAL HISTORY :  Past Medical History  Diagnosis Date  . HTN (hypertension)   . GERD (gastroesophageal reflux disease)   .  Osteoporosis   . CAD (coronary artery disease)   . Mitral valve prolapse   . TIA (transient ischemic attack)   . UTI (lower urinary tract infection)      Family History  Problem Relation Age of Onset  . Heart disease Father   . Heart disease Mother      History   Social History  . Marital Status: Married    Spouse Name: N/A    Number of Children: N/A  . Years of Education: N/A   Occupational History  . retired    Social History Main Topics  . Smoking status: Never Smoker   . Smokeless tobacco: Never Used  . Alcohol Use: Yes     Comment: SOCIAL  . Drug Use: No  . Sexual Activity: Not on file   Other Topics Concern  . Not on file   Social History Narrative  . No narrative on file     Allergies  Allergen Reactions  . Penicillins Itching and Rash      (Not in an outpatient encounter)     REVIEW OF SYSTEMS:  Per HPI  SUBJECTIVE:   VITAL SIGNS: Filed Vitals:   07/16/13 0027 07/16/13 0527 07/16/13 0700 07/16/13 1305  BP: 123/63 127/88 98/62 100/49  Pulse: 76 80 76 82  Temp: 99.4 F (37.4 C) 98.5 F (36.9 C)  98.3 F (36.8 C)  TempSrc: Oral Oral  Oral  Resp: 22 18  18   Weight:  74.571 kg (164 lb 6.4 oz)    SpO2: 100% 98%  100%   INTAKE / OUTPUT: I/O last 3 completed shifts: In: 360 [P.O.:360] Out: 300 [Urine:300]     PHYSICAL EXAMINATION: General:  No distress. Looks a bit deconditioned Neuro:  Axox3. Normal speech and gait HEENT:  Neck supple. PEERL + Cardiovascular:  RRR +.  Lungs:  CTA bilaterally. Does cough Abdomen:  Soft, non tender, no flank tenderness Musculoskeletal:  No cyanosis, no clubbing, no edema Skin:  intact  LABS: PULMONARY No results found for this basename: PHART, PCO2, PCO2ART, PO2, PO2ART, HCO3, TCO2, O2SAT,  in the last 168 hours  CBC  Recent Labs Lab 07/15/13 2123 07/16/13 0349  HGB 13.0 13.0  HCT 37.3* 37.0*  WBC 22.4* 24.1*  PLT 281 239    COAGULATION No results found for this basename: INR,  in  the last 168 hours  CARDIAC   Recent Labs Lab 07/15/13 2123  TROPONINI <0.30   No results found for this basename: PROBNP,  in the last 168 hours   CHEMISTRY  Recent Labs Lab 07/15/13 2123 07/16/13 0349  NA 136* 136*  K 3.9 3.6*  CL 97 100  CO2 25 21  GLUCOSE 115* 95  BUN 31* 30*  CREATININE 1.71* 1.73*  CALCIUM 9.0 8.5   The CrCl is unknown because both a height and weight (above a minimum accepted value) are required for this calculation.   LIVER  Recent Labs Lab 07/15/13 2123 07/16/13 0349  AST 27 23  ALT 24 22  ALKPHOS 80 78  BILITOT 0.7 0.7  PROT 7.2 6.7  ALBUMIN 3.0* 2.8*     INFECTIOUS No results found for this basename: LATICACIDVEN, PROCALCITON,  in the last 168 hours   ENDOCRINE CBG (last 3)  No results found for this basename: GLUCAP,  in the last 72 hours       IMAGING x48h  No results found.     ASSESSMENT / PLAN:  PULMONARY A: Chronic elevated diaphragm  chronic cough since [place] trip dec 2014. CT chest clear March 2015 Chronic fatigue v dyspnea - CPST canceled 07/15/13 due t o low bop P:   GERD and sinus Rx Contiue opd workup including FeNO when well  CARDIOVASCULAR A:  S.p RCA stent 2013 March S./p MV replacment 2013 March Currently Moderate- Mild Aortic REgurg per DR Einar Gip and stable since 2014   - hypotensive at admit  - dehydratiin/sepsis  P:  Repeat bnp and trop Dc coreg (for bp, dc can help cough)  RENAL A:  Mild new acute renal failure in setting of UTI and flank pain P:   Renal US Hydrate more aggressively Check bmet stat and serially  GASTROINTESTINAL A:  Some nausea - presumed due to UTI P:   zofran prn  HEMATOLOGIC A:  leulopcytosios P:  Monitor with abx  INFECTIOUS A:  Reported UTI P:   Await pan culture Continue ceftriaxone Check sepsis biomarkers  ENDOCRINE A:  No hx of dm   P:   Check hgba1c  NEUROLOGIC A:  intact P:   monitor  TODAY'S SUMMARY:   Updated patient,  wife, son in law and daughter. Tried to get hold of PCP .Marland Kitchen  [facility] to assume primary. PCCM will support care till [facility] assumes 7am 4.4.15     Dr. Brand Males, M.D., [facility].C.P Pulmonary and Critical Care Medicine Staff Physician [facility] Pager: [phone], If no answer or between  15:00h - 7:00h: call [phone]  07/16/2013 5:28 PM

## 2013-07-17 DIAGNOSIS — I10 Essential (primary) hypertension: Secondary | ICD-10-CM

## 2013-07-17 DIAGNOSIS — N390 Urinary tract infection, site not specified: Secondary | ICD-10-CM

## 2013-07-17 DIAGNOSIS — I2510 Atherosclerotic heart disease of native coronary artery without angina pectoris: Secondary | ICD-10-CM

## 2013-07-17 DIAGNOSIS — N179 Acute kidney failure, unspecified: Secondary | ICD-10-CM

## 2013-07-17 LAB — CBC WITH DIFFERENTIAL/PLATELET
Basophils Absolute: 0 10*3/uL (ref 0.0–0.1)
Basophils Relative: 0 % (ref 0–1)
Eosinophils Absolute: 0.2 10*3/uL (ref 0.0–0.7)
Eosinophils Relative: 1 % (ref 0–5)
HCT: 32.5 % — ABNORMAL LOW (ref 39.0–52.0)
Hemoglobin: 11.2 g/dL — ABNORMAL LOW (ref 13.0–17.0)
Lymphocytes Relative: 5 % — ABNORMAL LOW (ref 12–46)
Lymphs Abs: 0.9 10*3/uL (ref 0.7–4.0)
MCH: 31.8 pg (ref 26.0–34.0)
MCHC: 34.5 g/dL (ref 30.0–36.0)
MCV: 92.3 fL (ref 78.0–100.0)
Monocytes Absolute: 2 10*3/uL — ABNORMAL HIGH (ref 0.1–1.0)
Monocytes Relative: 12 % (ref 3–12)
Neutro Abs: 13.6 10*3/uL — ABNORMAL HIGH (ref 1.7–7.7)
Neutrophils Relative %: 82 % — ABNORMAL HIGH (ref 43–77)
Platelets: 226 10*3/uL (ref 150–400)
RBC: 3.52 MIL/uL — ABNORMAL LOW (ref 4.22–5.81)
RDW: 13.6 % (ref 11.5–15.5)
WBC: 16.7 10*3/uL — ABNORMAL HIGH (ref 4.0–10.5)

## 2013-07-17 LAB — PROTIME-INR
INR: 1.38 (ref 0.00–1.49)
Prothrombin Time: 16.6 seconds — ABNORMAL HIGH (ref 11.6–15.2)

## 2013-07-17 LAB — BASIC METABOLIC PANEL
BUN: 19 mg/dL (ref 6–23)
CO2: 21 mEq/L (ref 19–32)
Calcium: 7.5 mg/dL — ABNORMAL LOW (ref 8.4–10.5)
Chloride: 102 mEq/L (ref 96–112)
Creatinine, Ser: 1.3 mg/dL (ref 0.50–1.35)
GFR calc Af Amer: 62 mL/min — ABNORMAL LOW (ref >90)
GFR calc non Af Amer: 53 mL/min — ABNORMAL LOW (ref >90)
Glucose, Bld: 92 mg/dL (ref 70–99)
Potassium: 3.6 mEq/L — ABNORMAL LOW (ref 3.7–5.3)
Sodium: 137 mEq/L (ref 137–147)

## 2013-07-17 LAB — OCCULT BLOOD X 1 CARD TO LAB, STOOL: Fecal Occult Bld: NEGATIVE

## 2013-07-17 LAB — PHOSPHORUS: Phosphorus: 1.8 mg/dL — ABNORMAL LOW (ref 2.3–4.6)

## 2013-07-17 LAB — URINE CULTURE: Colony Count: 7000

## 2013-07-17 LAB — MAGNESIUM: Magnesium: 1.4 mg/dL — ABNORMAL LOW (ref 1.5–2.5)

## 2013-07-17 LAB — TROPONIN I: Troponin I: <0.3 ng/mL (ref <0.30)

## 2013-07-17 LAB — HEMOGLOBIN A1C
Hgb A1c MFr Bld: 5.3 % (ref <5.7)
Mean Plasma Glucose: 105 mg/dL (ref <117)

## 2013-07-17 LAB — CORTISOL: Cortisol, Plasma: 15.3 ug/dL

## 2013-07-17 MED ORDER — ENOXAPARIN SODIUM 40 MG/0.4ML ~~LOC~~ SOLN
40.0000 mg | SUBCUTANEOUS | Status: DC
  Administered 2013-07-17: 40 mg via SUBCUTANEOUS
  Filled 2013-07-17: qty 0.4

## 2013-07-17 MED ORDER — POTASSIUM PHOSPHATE DIBASIC 3 MMOLE/ML IV SOLN
24.0000 mmol | Freq: Once | INTRAVENOUS | Status: AC
  Administered 2013-07-17: 24 mmol via INTRAVENOUS
  Filled 2013-07-17 (×2): qty 8

## 2013-07-17 MED ORDER — MAGNESIUM SULFATE 40 MG/ML IJ SOLN
2.0000 g | Freq: Once | INTRAMUSCULAR | Status: AC
  Administered 2013-07-17: 2 g via INTRAVENOUS
  Filled 2013-07-17: qty 50

## 2013-07-17 NOTE — Progress Notes (Signed)
[facility] PROGRESS NOTE  [name] FYB:[id number] DOB: 05-May-1940 DOA: 07/15/2013 PCP: [name], MD  Assessment/Plan: 1. UTI 1. Urine culture pending 2. Blood cx thus far neg 3. Leukocytosis trending down with empiric abx (vanc and cefepime) 4. Afebrile overnight 2. CAD 1. Stable 3. DVT prophylaxis 1. Lovenox  Code Status: Full Family Communication: Pt in room (indicate person spoken with, relationship, and if by phone, the number) Disposition Plan: Pending  Consultants:  Critical Care  Procedures:    Antibiotics:  Cefepime 07/16/13>>>  Vancomycin 07/16/13>>>  HPI/Subjective: No acute events noted overnight  Objective: Filed Vitals:   07/16/13 2201 07/17/13 0530 07/17/13 0608 07/17/13 0900  BP: 100/57 97/49 110/59   Pulse: 94 79    Temp: 98.8 F (37.1 C) 98.3 F (36.8 C)    TempSrc: Oral Oral    Resp: 18 18    Height:    5' 7.32" (1.71 m)  Weight:  74.3 kg (163 lb 12.8 oz)    SpO2: 100% 100%      Intake/Output Summary (Last 24 hours) at 07/17/13 1447 Last data filed at 07/17/13 0400  Gross per 24 hour  Intake 1401.25 ml  Output    100 ml  Net 1301.25 ml   Filed Weights   07/16/13 0527 07/17/13 0530  Weight: 74.571 kg (164 lb 6.4 oz) 74.3 kg (163 lb 12.8 oz)    Exam:   General:  Awake, in nad  Cardiovascular: regular, s1, s2  Respiratory: normal resp effort, no wheezing  Abdomen: soft, nondistended  Musculoskeletal: perfused, no clubbing   Data Reviewed: Basic Metabolic Panel:  Recent Labs Lab 07/15/13 2123 07/16/13 0349 07/16/13 2010 07/17/13 0426  NA 136* 136* 138 137  K 3.9 3.6* 4.0 3.6*  CL 97 100 104 102  CO2 25 21 21 21   GLUCOSE 115* 95 105* 92  BUN 31* 30* 22 19  CREATININE 1.71* 1.73* 1.36* 1.30  CALCIUM 9.0 8.5 7.6* 7.5*  MG  --   --   --  1.4*  PHOS  --   --   --  1.8*   Liver Function Tests:  Recent Labs Lab 07/15/13 2123 07/16/13 0349  AST 27 23  ALT 24 22  ALKPHOS 80 78  BILITOT 0.7 0.7   PROT 7.2 6.7  ALBUMIN 3.0* 2.8*   No results found for this basename: LIPASE, AMYLASE,  in the last 168 hours No results found for this basename: AMMONIA,  in the last 168 hours CBC:  Recent Labs Lab 07/15/13 2123 07/16/13 0349 07/17/13 0426  WBC 22.4* 24.1* 16.7*  NEUTROABS  --  23.1* 13.6*  HGB 13.0 13.0 11.2*  HCT 37.3* 37.0* 32.5*  MCV 92.3 91.1 92.3  PLT 281 239 226   Cardiac Enzymes:  Recent Labs Lab 07/15/13 2123 07/16/13 2010 07/17/13 0426  TROPONINI <0.30 <0.30 <0.30   BNP (last 3 results)  Recent Labs  07/16/13 2010  PROBNP 1117.0*   CBG: No results found for this basename: GLUCAP,  in the last 168 hours  Recent Results (from the past 240 hour(s))  CULTURE, BLOOD (ROUTINE X 2)     Status: None   Collection Time    07/15/13  9:23 PM      Result Value Ref Range Status   Specimen Description BLOOD RIGHT ARM   Final   Special Requests BOTTLES DRAWN AEROBIC AND ANAEROBIC 10CC   Final   Culture  Setup Time     Final   Value: 07/16/2013 03:58  Performed at [facility]     Final   Value:        BLOOD CULTURE RECEIVED NO GROWTH TO DATE CULTURE WILL BE HELD FOR 5 DAYS BEFORE ISSUING A FINAL NEGATIVE REPORT     Performed at [facility]   Report Status PENDING   Incomplete  CULTURE, BLOOD (ROUTINE X 2)     Status: None   Collection Time    07/15/13  9:32 PM      Result Value Ref Range Status   Specimen Description BLOOD RIGHT FOREARM   Final   Special Requests BOTTLES DRAWN AEROBIC ONLY 8CC   Final   Culture  Setup Time     Final   Value: 07/16/2013 04:02     Performed at [facility]   Culture     Final   Value:        BLOOD CULTURE RECEIVED NO GROWTH TO DATE CULTURE WILL BE HELD FOR 5 DAYS BEFORE ISSUING A FINAL NEGATIVE REPORT     Performed at [facility]   Report Status PENDING   Incomplete     Studies: US Renal  07/16/2013   CLINICAL DATA:  Renal insufficiency, check for hydronephrosis  EXAM:  RENAL/URINARY TRACT ULTRASOUND COMPLETE  COMPARISON:  None.  FINDINGS: Right Kidney:  Length: 11.3 cm. Echogenicity within normal limits. No mass or hydronephrosis visualized.  Left Kidney:  Length: 12.4 cm. Mild fullness of an upper pole calyx is noted. No obstructive changes are seen.  Bladder:  Partially decompressed. The prostate appears enlarged at 5 cm in greatest dimension.  IMPRESSION: Mild fullness of the upper pole calyx in the left kidney. No other focal abnormality is noted.   Electronically Signed   By: Inez Catalina M.D.   On: 07/16/2013 19:39    Scheduled Meds: . aspirin EC  81 mg Oral Daily  . atorvastatin  10 mg Oral q1800  . ceFEPime (MAXIPIME) IV  1 g Intravenous Q24H  . enoxaparin (LOVENOX) injection  40 mg Subcutaneous Q24H  . fluticasone  2 spray Each Nare Daily  . pantoprazole  40 mg Oral Q1200  . potassium phosphate IVPB (mmol)  24 mmol Intravenous Once  . vancomycin  1,000 mg Intravenous Q24H   Continuous Infusions: . sodium chloride 100 mL/hr at 07/16/13 1801  . sodium chloride 125 mL/hr at 07/17/13 7169    Active Problems:   UTI (urinary tract infection)   Hypotension, unspecified   Hypotension   UTI (lower urinary tract infection)   Acute renal failure   Sepsis(995.91)  Time spent: 55min  Deaglan Lile, [facility] Pager [phone]. If 7PM-7AM, please contact night-coverage at [email or web address], password [facility] 07/17/2013, 2:47 PM  LOS: 2 days

## 2013-07-17 NOTE — Progress Notes (Signed)
  Recent Labs Lab 07/15/13 2123 07/16/13 0349 07/16/13 2010 07/17/13 0426  NA 136* 136* 138 137  K 3.9 3.6* 4.0 3.6*  CL 97 100 104 102  CO2 25 21 21 21   GLUCOSE 115* 95 105* 92  BUN 31* 30* 22 19  CREATININE 1.71* 1.73* 1.36* 1.30  CALCIUM 9.0 8.5 7.6* 7.5*  MG  --   --   --  1.4*  PHOS  --   --   --  1.8*   A Low K Low Phos Low Mag  Creat improved to baseline  P Rx K  Phos Rx Mag sulfate   Dr. Brand Males, M.D., [facility].C.P Pulmonary and Critical Care Medicine Staff Physician [place] [facility] Pager: [phone], If no answer or between  15:00h - 7:00h: call [phone]  07/17/2013 10:29 AM

## 2013-07-18 DIAGNOSIS — I2510 Atherosclerotic heart disease of native coronary artery without angina pectoris: Secondary | ICD-10-CM

## 2013-07-18 LAB — CBC WITH DIFFERENTIAL/PLATELET
Basophils Absolute: 0 10*3/uL (ref 0.0–0.1)
Basophils Relative: 0 % (ref 0–1)
Eosinophils Absolute: 0.2 10*3/uL (ref 0.0–0.7)
Eosinophils Relative: 2 % (ref 0–5)
HCT: 32.5 % — ABNORMAL LOW (ref 39.0–52.0)
Hemoglobin: 11.4 g/dL — ABNORMAL LOW (ref 13.0–17.0)
Lymphocytes Relative: 8 % — ABNORMAL LOW (ref 12–46)
Lymphs Abs: 1 10*3/uL (ref 0.7–4.0)
MCH: 32 pg (ref 26.0–34.0)
MCHC: 35.1 g/dL (ref 30.0–36.0)
MCV: 91.3 fL (ref 78.0–100.0)
Monocytes Absolute: 1.3 10*3/uL — ABNORMAL HIGH (ref 0.1–1.0)
Monocytes Relative: 11 % (ref 3–12)
Neutro Abs: 9.4 10*3/uL — ABNORMAL HIGH (ref 1.7–7.7)
Neutrophils Relative %: 79 % — ABNORMAL HIGH (ref 43–77)
Platelets: 178 10*3/uL (ref 150–400)
RBC: 3.56 MIL/uL — ABNORMAL LOW (ref 4.22–5.81)
RDW: 13.5 % (ref 11.5–15.5)
WBC: 11.9 10*3/uL — ABNORMAL HIGH (ref 4.0–10.5)

## 2013-07-18 LAB — BASIC METABOLIC PANEL
BUN: 10 mg/dL (ref 6–23)
CO2: 21 mEq/L (ref 19–32)
Calcium: 7.4 mg/dL — ABNORMAL LOW (ref 8.4–10.5)
Chloride: 104 mEq/L (ref 96–112)
Creatinine, Ser: 1.09 mg/dL (ref 0.50–1.35)
GFR calc Af Amer: 76 mL/min — ABNORMAL LOW (ref >90)
GFR calc non Af Amer: 66 mL/min — ABNORMAL LOW (ref >90)
Glucose, Bld: 92 mg/dL (ref 70–99)
Potassium: 4.2 mEq/L (ref 3.7–5.3)
Sodium: 138 mEq/L (ref 137–147)

## 2013-07-18 LAB — PROCALCITONIN: Procalcitonin: 9.92 ng/mL

## 2013-07-18 LAB — MAGNESIUM: Magnesium: 1.6 mg/dL (ref 1.5–2.5)

## 2013-07-18 LAB — PHOSPHORUS: Phosphorus: 2.2 mg/dL — ABNORMAL LOW (ref 2.3–4.6)

## 2013-07-18 MED ORDER — CEFPODOXIME PROXETIL 100 MG PO TABS: 100.0000 mg | ORAL_TABLET | Freq: Two times a day (BID) | ORAL | Status: DC

## 2013-07-18 MED ORDER — MAGNESIUM SULFATE IN D5W 10-5 MG/ML-% IV SOLN
1.0000 g | Freq: Once | INTRAVENOUS | Status: AC
  Administered 2013-07-18: 1 g via INTRAVENOUS
  Filled 2013-07-18: qty 100

## 2013-07-18 MED ORDER — DEXTROSE 5 % IV SOLN
1.0000 g | Freq: Three times a day (TID) | INTRAVENOUS | Status: DC
  Administered 2013-07-18: 1 g via INTRAVENOUS
  Filled 2013-07-18 (×3): qty 1

## 2013-07-18 MED ORDER — K PHOS MONO-SOD PHOS DI & MONO 155-852-130 MG PO TABS
250.0000 mg | ORAL_TABLET | Freq: Once | ORAL | Status: AC
  Administered 2013-07-18: 250 mg via ORAL
  Filled 2013-07-18: qty 1

## 2013-07-18 MED ORDER — VANCOMYCIN HCL IN DEXTROSE 750-5 MG/150ML-% IV SOLN
750.0000 mg | Freq: Two times a day (BID) | INTRAVENOUS | Status: DC
  Administered 2013-07-18: 750 mg via INTRAVENOUS
  Filled 2013-07-18 (×2): qty 150

## 2013-07-18 MED ORDER — TAMSULOSIN HCL 0.4 MG PO CAPS: 0.4000 mg | ORAL_CAPSULE | Freq: Every day | ORAL | Status: DC

## 2013-07-18 NOTE — Discharge Summary (Signed)
Physician Discharge Summary  [name] YTK:[id number] DOB: [birth year] DOA: 07/15/2013  PCP: [name], MD  Admit date: 07/15/2013 Discharge date: 07/18/2013  Time spent: 35 minutes  Recommendations for Outpatient Follow-up:  1. Follow up with PCP in 1 week 2. Would work up prostatic hypertrophy as outpatient  Discharge Diagnoses:  Active Problems:   UTI (urinary tract infection)   Hypotension, unspecified   Hypotension   UTI (lower urinary tract infection)   Acute renal failure   Sepsis(995.91) Prostatic hypertrophy  Discharge Condition: Improved  Diet recommendation: Regular  Filed Weights   07/16/13 0527 07/17/13 0530  Weight: 74.571 kg (164 lb 6.4 oz) 74.3 kg (163 lb 12.8 oz)    History of present illness:  73 yo male with c/o discoloration of urine , and malaise. Pt had prior hx of chills til treated with cipro, and felt somewhat better after treatment. Repeat ua showed evidence of uti. Pt denies fever, chills, cp, palp, sob, n/v, diarrhea , brbpr, black stool, flank pain. Pt had wbc 24 and sent to hospital for admission due to mild hypotension.   Hospital Course:  UTI  1. Urine culture without significant growth - however pt was given a course of cipro prior to admit 2. Blood cx thus far neg 3. Leukocytosis trending down with empiric abx (vanc and cefepime) 4. Afebrile throughout the remainder of admission 5. Transition to vantin on discharge  CAD  1. Remained Stable  DVT prophylaxis  1. Was continued on Lovenox  Hypotension       1. Likely secondary to acute illness per above       2. BP meds held. Recommend resuming carvedilol and titrate/add bp meds as         tolertated  Leukocytosis       1. Normalizing with antibiotics  Enlarged Prostate       1. Noted on abd [place]       2. Will start tamsulosin       3. Would recommend outpatient work up for prostatic hypertrophy  Hypophosphatemia  1. Replaced  Hypomagnesemia  1.  Replaced   Consultations:  Critical Care  Discharge Exam: Filed Vitals:   07/17/13 0900 07/17/13 1500 07/17/13 2046 07/18/13 0603  BP:  118/57 124/53 126/56  Pulse:  73 77 78  Temp:  98.9 F (37.2 C) 98.8 F (37.1 C) 98.1 F (36.7 C)  TempSrc:  Oral Oral Oral  Resp:  18 18 18   Height: 5' 7.32" (1.71 m)     Weight:      SpO2:  100% 100% 99%    General: awake, in nad Cardiovascular: regular, s1, s2 Respiratory: normal resp effort, no wheezing  Discharge Instructions     Medication List    STOP taking these medications       ciprofloxacin 500 MG tablet  Commonly known as:  CIPRO     losartan-hydrochlorothiazide 50-12.5 MG per tablet  Commonly known as:  HYZAAR      TAKE these medications       aspirin 81 MG tablet  Take 81 mg by mouth daily.     calcium-vitamin D 500-200 MG-UNIT per tablet  Commonly known as:  OSCAL WITH D  Take 2 tablets by mouth daily.     carvedilol 6.25 MG tablet  Commonly known as:  COREG  Take 6.25 mg by mouth 2 (two) times daily with a meal.     cefpodoxime 100 MG tablet  Commonly known as:  VANTIN  Take 1  tablet (100 mg total) by mouth 2 (two) times daily.     cetirizine 10 MG tablet  Commonly known as:  ZYRTEC  Take 10 mg by mouth daily.     multivitamin capsule  Take 1 capsule by mouth daily.     rosuvastatin 10 MG tablet  Commonly known as:  CRESTOR  Take 10 mg by mouth daily.     tamsulosin 0.4 MG Caps capsule  Commonly known as:  FLOMAX  Take 1 capsule (0.4 mg total) by mouth daily.       Allergies  Allergen Reactions  . Penicillins Itching and Rash   Follow-up Information   Follow up with [name], MD. Schedule an appointment as soon as possible for a visit in 1 week.   Specialty:  Internal Medicine   Contact information:   [address] [place] [address] [phone]        The results of significant diagnostics from this hospitalization (including imaging, microbiology,  ancillary and laboratory) are listed below for reference.    Significant Diagnostic Studies: US Renal  07/16/2013   CLINICAL DATA:  Renal insufficiency, check for hydronephrosis  EXAM: RENAL/URINARY TRACT ULTRASOUND COMPLETE  COMPARISON:  None.  FINDINGS: Right Kidney:  Length: 11.3 cm. Echogenicity within normal limits. No mass or hydronephrosis visualized.  Left Kidney:  Length: 12.4 cm. Mild fullness of an upper pole calyx is noted. No obstructive changes are seen.  Bladder:  Partially decompressed. The prostate appears enlarged at 5 cm in greatest dimension.  IMPRESSION: Mild fullness of the upper pole calyx in the left kidney. No other focal abnormality is noted.   Electronically Signed   By: Inez Catalina M.D.   On: 07/16/2013 19:39   Ct Chest High Resolution  07/16/2013   CLINICAL DATA:  Cough.  Question interstitial lung disease.  EXAM: CHEST CT WITHOUT CONTRAST  TECHNIQUE: Multidetector CT imaging of the chest was performed following the standard protocol without intravenous contrast. High resolution imaging of the lungs, as well as inspiratory and expiratory imaging, was performed.  COMPARISON:  DG CHEST 2 VIEW dated 06/25/2010  FINDINGS: No pathologically enlarged mediastinal lymph nodes. Hilar regions are difficult to definitively evaluate without IV contrast. No axillary adenopathy. Atherosclerotic calcification of the arterial vasculature, including three-vessel involvement of the coronary arteries. Heart size normal. No pericardial effusion.  There is scarring in the right lower lobe, adjacent to an elevated right hemidiaphragm. 3 mm right upper lobe nodule (image 12). A 4 mm nodule is seen in the left lower lobe (image 34). No subpleural reticulation, traction bronchiectasis/ bronchiolectasis, ground-glass, architectural distortion or honeycombing. No air trapping on expiratory and expiratory imaging. No pleural fluid. Airway is unremarkable.  Incidental imaging of the upper abdomen shows the  visualized portions of the liver, gallbladder, adrenal glands, spleen, pancreas and stomach to be grossly unremarkable. No upper abdominal adenopathy. No worrisome lytic or sclerotic lesions. Degenerative changes are seen in the spine. Prominent Schmorl's nodes. Mild anterior wedging of mid and lower thoracic vertebral bodies, unchanged from 06/25/2010.  IMPRESSION: 1. No evidence of interstitial lung disease. 2. Elevated right hemidiaphragm with scarring in the adjacent right lower lobe, unchanged from 06/25/2010. 3. Three-vessel coronary artery calcification. 4. Tiny pulmonary nodules. If the patient is at high risk for bronchogenic carcinoma, follow-up chest CT at 1 year is recommended. If the patient is at low risk, no follow-up is needed. This recommendation follows the consensus statement: Guidelines for Management of Small Pulmonary Nodules Detected on  CT Scans: A Statement from the [place] as published in Radiology 2005; 237:395-400.   Electronically Signed   By: Lorin Picket M.D.   On: 07/16/2013 11:37    Microbiology: Recent Results (from the past 240 hour(s))  CULTURE, BLOOD (ROUTINE X 2)     Status: None   Collection Time    07/15/13  9:23 PM      Result Value Ref Range Status   Specimen Description BLOOD RIGHT ARM   Final   Special Requests BOTTLES DRAWN AEROBIC AND ANAEROBIC 10CC   Final   Culture  Setup Time     Final   Value: 07/16/2013 03:58     Performed at [facility]   Culture     Final   Value:        BLOOD CULTURE RECEIVED NO GROWTH TO DATE CULTURE WILL BE HELD FOR 5 DAYS BEFORE ISSUING A FINAL NEGATIVE REPORT     Performed at [facility]   Report Status PENDING   Incomplete  CULTURE, BLOOD (ROUTINE X 2)     Status: None   Collection Time    07/15/13  9:32 PM      Result Value Ref Range Status   Specimen Description BLOOD RIGHT FOREARM   Final   Special Requests BOTTLES DRAWN AEROBIC ONLY 8CC   Final   Culture  Setup Time     Final    Value: 07/16/2013 04:02     Performed at [facility]   Culture     Final   Value:        BLOOD CULTURE RECEIVED NO GROWTH TO DATE CULTURE WILL BE HELD FOR 5 DAYS BEFORE ISSUING A FINAL NEGATIVE REPORT     Performed at [facility]   Report Status PENDING   Incomplete  URINE CULTURE     Status: None   Collection Time    07/16/13  5:44 PM      Result Value Ref Range Status   Specimen Description URINE, CLEAN CATCH   Final   Special Requests NONE   Final   Culture  Setup Time     Final   Value: 07/16/2013 18:49     Performed at [facility]     Final   Value: 7,000 COLONIES/ML     Performed at [facility]   Culture     Final   Value: INSIGNIFICANT GROWTH     Performed at [facility]   Report Status 07/17/2013 FINAL   Final     Labs: Basic Metabolic Panel:  Recent Labs Lab 07/15/13 2123 07/16/13 0349 07/16/13 2010 07/17/13 0426 07/18/13 0515  NA 136* 136* 138 137 138  K 3.9 3.6* 4.0 3.6* 4.2  CL 97 100 104 102 104  CO2 25 21 21 21 21   GLUCOSE 115* 95 105* 92 92  BUN 31* 30* 22 19 10   CREATININE 1.71* 1.73* 1.36* 1.30 1.09  CALCIUM 9.0 8.5 7.6* 7.5* 7.4*  MG  --   --   --  1.4* 1.6  PHOS  --   --   --  1.8* 2.2*   Liver Function Tests:  Recent Labs Lab 07/15/13 2123 07/16/13 0349  AST 27 23  ALT 24 22  ALKPHOS 80 78  BILITOT 0.7 0.7  PROT 7.2 6.7  ALBUMIN 3.0* 2.8*   No results found for this basename: LIPASE, AMYLASE,  in the last 168 hours No results found  for this basename: AMMONIA,  in the last 168 hours CBC:  Recent Labs Lab 07/15/13 2123 07/16/13 0349 07/17/13 0426 07/18/13 0515  WBC 22.4* 24.1* 16.7* 11.9*  NEUTROABS  --  23.1* 13.6* 9.4*  HGB 13.0 13.0 11.2* 11.4*  HCT 37.3* 37.0* 32.5* 32.5*  MCV 92.3 91.1 92.3 91.3  PLT 281 239 226 178   Cardiac Enzymes:  Recent Labs Lab 07/15/13 2123 07/16/13 2010 07/17/13 0426  TROPONINI <0.30 <0.30 <0.30   BNP: BNP (last 3  results)  Recent Labs  07/16/13 2010  PROBNP 1117.0*   CBG: No results found for this basename: GLUCAP,  in the last 168 hours  Signed:  Michie Molnar K  [facility] 07/18/2013, 2:11 PM

## 2013-07-18 NOTE — Progress Notes (Signed)
ANTIBIOTIC CONSULT NOTE - Follow-up  Pharmacy Consult for vanc/cefepime Indication: rule out sepsis  Allergies  Allergen Reactions  . Penicillins Itching and Rash    Patient Measurements: Height: 5' 7.32" (171 cm) Weight: 163 lb 12.8 oz (74.3 kg) IBW/kg (Calculated) : 66.84   Vital Signs: Temp: 98.1 F (36.7 C) (04/05 0603) Temp src: Oral (04/05 0603) BP: 126/56 mmHg (04/05 0603) Pulse Rate: 78 (04/05 0603) Intake/Output from previous day: 04/04 0701 - 04/05 0700 In: 1731.7 [P.O.:240; I.V.:1491.7] Out: -  Intake/Output from this shift:    Labs:  Recent Labs  07/16/13 0349 07/16/13 2010 07/17/13 0426 07/18/13 0515  WBC 24.1*  --  16.7* 11.9*  HGB 13.0  --  11.2* 11.4*  PLT 239  --  226 178  CREATININE 1.73* 1.36* 1.30 1.09   Estimated Creatinine Clearance: 57.9 ml/min (by C-G formula based on Cr of 1.09). No results found for this basename: VANCOTROUGH, VANCOPEAK, VANCORANDOM, GENTTROUGH, GENTPEAK, GENTRANDOM, TOBRATROUGH, TOBRAPEAK, TOBRARND, AMIKACINPEAK, AMIKACINTROU, AMIKACIN,  in the last 72 hours   Microbiology: Recent Results (from the past 720 hour(s))  CULTURE, BLOOD (ROUTINE X 2)     Status: None   Collection Time    07/15/13  9:23 PM      Result Value Ref Range Status   Specimen Description BLOOD RIGHT ARM   Final   Special Requests BOTTLES DRAWN AEROBIC AND ANAEROBIC 10CC   Final   Culture  Setup Time     Final   Value: 07/16/2013 03:58     Performed at [facility]   Culture     Final   Value:        BLOOD CULTURE RECEIVED NO GROWTH TO DATE CULTURE WILL BE HELD FOR 5 DAYS BEFORE ISSUING A FINAL NEGATIVE REPORT     Performed at [facility]   Report Status PENDING   Incomplete  CULTURE, BLOOD (ROUTINE X 2)     Status: None   Collection Time    07/15/13  9:32 PM      Result Value Ref Range Status   Specimen Description BLOOD RIGHT FOREARM   Final   Special Requests BOTTLES DRAWN AEROBIC ONLY 8CC   Final   Culture  Setup Time      Final   Value: 07/16/2013 04:02     Performed at [facility]   Culture     Final   Value:        BLOOD CULTURE RECEIVED NO GROWTH TO DATE CULTURE WILL BE HELD FOR 5 DAYS BEFORE ISSUING A FINAL NEGATIVE REPORT     Performed at [facility]   Report Status PENDING   Incomplete  URINE CULTURE     Status: None   Collection Time    07/16/13  5:44 PM      Result Value Ref Range Status   Specimen Description URINE, CLEAN CATCH   Final   Special Requests NONE   Final   Culture  Setup Time     Final   Value: 07/16/2013 18:49     Performed at [facility]     Final   Value: 7,000 COLONIES/ML     Performed at [facility]   Culture     Final   Value: INSIGNIFICANT GROWTH     Performed at [facility]   Report Status 07/17/2013 FINAL   Final   Assessment: 73 yo who was admitted for possible sepsis from UTI. He  was recently placed on cipro for UTI but initiated on cefepime and vancomycin upon admission. Scr has improved drastically from admission so doses are now too low.  Leukocytosis has improved as well.  Cefepime 4/3>> Vanc 4/3>> CTX 4/2>>4/3  4/2 Blood - NGTD 4/3 Urine - 7K>>insig growth  Goal of Therapy:  Vancomycin trough level 15-20 mcg/ml  Plan:  1. Increase cefepime to 1gm IV Q8H 2. Increase vancomycin to 750mg  IV Q12H 3. F/u renal fxn, C&S, clinical status and trough at SS 4. F/u ability to de-escalate antibiotics  Salome Arnt, PharmD, BCPS Pager # [phone] 07/18/2013 9:08 AM

## 2013-07-18 NOTE — Progress Notes (Signed)
Remote care     Recent Labs Lab 07/15/13 2123 07/16/13 0349 07/16/13 2010 07/17/13 0426 07/18/13 0515  NA 136* 136* 138 137 138  K 3.9 3.6* 4.0 3.6* 4.2  CL 97 100 104 102 104  CO2 25 21 21 21 21   GLUCOSE 115* 95 105* 92 92  BUN 31* 30* 22 19 10   CREATININE 1.71* 1.73* 1.36* 1.30 1.09  CALCIUM 9.0 8.5 7.6* 7.5* 7.4*  MG  --   --   --  1.4* 1.6  PHOS  --   --   --  1.8* 2.2*    A Mag improved but still low Phos improved but still mild low  P MAg sulfate 1gm IV Neutraphos 1 packket po   Dr. Brand Males, M.D., [facility].C.P Pulmonary and Critical Care Medicine Staff Physician [place] [facility] Pager: [phone], If no answer or between  15:00h - 7:00h: call [phone]  07/18/2013 7:47 AM

## 2013-07-19 NOTE — Telephone Encounter (Signed)
LMTCB

## 2013-07-21 ENCOUNTER — Telehealth: Payer: Self-pay | Admitting: Internal Medicine

## 2013-07-21 DIAGNOSIS — R05 Cough: Secondary | ICD-10-CM

## 2013-07-21 DIAGNOSIS — R059 Cough, unspecified: Secondary | ICD-10-CM

## 2013-07-21 NOTE — Telephone Encounter (Signed)
MR is aware of this and is following the pt. Nothing further needed. Raoul Bing, CMA

## 2013-07-22 ENCOUNTER — Other Ambulatory Visit: Payer: Medicare Other

## 2013-07-22 ENCOUNTER — Other Ambulatory Visit: Payer: Self-pay | Admitting: Internal Medicine

## 2013-07-22 DIAGNOSIS — R945 Abnormal results of liver function studies: Principal | ICD-10-CM

## 2013-07-22 DIAGNOSIS — R7989 Other specified abnormal findings of blood chemistry: Secondary | ICD-10-CM

## 2013-07-22 LAB — CULTURE, BLOOD (ROUTINE X 2)
Culture: NO GROWTH
Culture: NO GROWTH

## 2013-07-22 NOTE — Telephone Encounter (Signed)
LMTCB for [name]  Looks like his CPST was not completed due to low BP and changes on his ECG

## 2013-07-23 ENCOUNTER — Ambulatory Visit
Admission: RE | Admit: 2013-07-23 | Discharge: 2013-07-23 | Disposition: A | Discharge status: Home / Self Care | Payer: Medicare Other | Source: Ambulatory Visit | Attending: Internal Medicine | Admitting: Internal Medicine

## 2013-07-23 DIAGNOSIS — R7989 Other specified abnormal findings of blood chemistry: Secondary | ICD-10-CM

## 2013-07-23 DIAGNOSIS — R945 Abnormal results of liver function studies: Principal | ICD-10-CM

## 2013-07-23 NOTE — Telephone Encounter (Signed)
Patient returning call.

## 2013-07-23 NOTE — Telephone Encounter (Signed)
lmomtcb x1 for varsha

## 2013-07-23 NOTE — Telephone Encounter (Signed)
Pt is asking should they try the CPST again? Please advise if you would like to r/s? [place] Bing, CMA

## 2013-07-24 ENCOUNTER — Other Ambulatory Visit: Payer: Self-pay | Admitting: Cardiology

## 2013-07-26 ENCOUNTER — Telehealth: Payer: Self-pay | Admitting: Internal Medicine

## 2013-07-26 NOTE — Telephone Encounter (Signed)
Called spoke with pt. He reports he is having CPX on thursday. He currently c/o non prod cough. no wheezing, no chest tx, no chest cong, no PND, no runny nose. He wants to make sure he can still have the test done. Please advise MR thanks

## 2013-07-26 NOTE — Telephone Encounter (Signed)
As long it is chronic cough that he has and not acutely ill and he feels his strength is back. I wanted test next week anyways so give him some time to cool off from recent UTI. If possible, do CPST next week

## 2013-07-26 NOTE — Telephone Encounter (Signed)
Spouse aware and order placed. Nothing further needed

## 2013-07-26 NOTE — Telephone Encounter (Signed)
When I was off on spiring break last week, I had cleared him to go to [place] without CPST because Mr Cheri Rous could not do CPST. However, I founmd out last night that because wife is sick they canceled [place] trip. Please set up CPST - but after one week to ensure patient BP under cotnrol (yesterday was sbp 190 and new med started).   ROV with me after CPST

## 2013-07-27 NOTE — Telephone Encounter (Signed)
Called made pt aware. PCC's please advise MR wants CPST done next week. thanks

## 2013-07-27 NOTE — Telephone Encounter (Signed)
CPX has been r/s to Thurs 08/05/13 at 1:00 at [facility]. Pt will need to arrive at 12:30. Called and spoke with patient and he is aware of appointment date, time and location. CPX Patient instructions have been mailed to patient. Pt is aware of appointment and that instructions are being mailed to him. Rhonda J Cobb

## 2013-07-29 ENCOUNTER — Encounter (HOSPITAL_COMMUNITY): Payer: Medicare Other

## 2013-08-05 ENCOUNTER — Ambulatory Visit (HOSPITAL_COMMUNITY): Payer: Medicare Other | Attending: Internal Medicine

## 2013-08-05 DIAGNOSIS — R05 Cough: Secondary | ICD-10-CM

## 2013-08-05 DIAGNOSIS — R059 Cough, unspecified: Secondary | ICD-10-CM

## 2013-08-05 DIAGNOSIS — R0989 Other specified symptoms and signs involving the circulatory and respiratory systems: Secondary | ICD-10-CM | POA: Insufficient documentation

## 2013-08-05 DIAGNOSIS — R0609 Other forms of dyspnea: Secondary | ICD-10-CM | POA: Insufficient documentation

## 2013-08-09 ENCOUNTER — Telehealth: Payer: Self-pay | Admitting: Internal Medicine

## 2013-08-09 NOTE — Telephone Encounter (Signed)
Can you get hium in please Thursday 08/12/13 to discuss CPST results - 30 min slot

## 2013-08-09 NOTE — Telephone Encounter (Signed)
Per MR add pt on Thursday morning at 9:30am for an appt. I spoke with the pt and he is ok with this appt. I will get TD to open schedule so I can add the pt. Lar[place] Bing, CMA

## 2013-08-11 DIAGNOSIS — R0602 Shortness of breath: Secondary | ICD-10-CM

## 2013-08-11 NOTE — Assessment & Plan Note (Addendum)
Could be multifactorial. Will need CPST ; will set it up

## 2013-08-11 NOTE — Assessment & Plan Note (Addendum)
Will get FeNO testing by Dr Remus Blake. IF high, start advair; I gav him samples .  Iwill then also have him off coreg

## 2013-08-12 ENCOUNTER — Ambulatory Visit (INDEPENDENT_AMBULATORY_CARE_PROVIDER_SITE_OTHER): Payer: Medicare Other | Admitting: Internal Medicine

## 2013-08-12 ENCOUNTER — Encounter: Payer: Self-pay | Admitting: Internal Medicine

## 2013-08-12 VITALS — BP 122/70 | HR 64 | Temp 96.7°F | Ht 68.0 in | Wt 162.0 lb

## 2013-08-12 DIAGNOSIS — R059 Cough, unspecified: Secondary | ICD-10-CM

## 2013-08-12 DIAGNOSIS — R05 Cough: Secondary | ICD-10-CM

## 2013-08-12 DIAGNOSIS — R0989 Other specified symptoms and signs involving the circulatory and respiratory systems: Secondary | ICD-10-CM

## 2013-08-12 DIAGNOSIS — R0600 Dyspnea, unspecified: Secondary | ICD-10-CM

## 2013-08-12 DIAGNOSIS — R0609 Other forms of dyspnea: Secondary | ICD-10-CM

## 2013-08-12 DIAGNOSIS — R0689 Other abnormalities of breathing: Secondary | ICD-10-CM

## 2013-08-12 NOTE — Assessment & Plan Note (Addendum)
Curerntr residual Dyspnea/Fatigue is due to asthma and circulatory limitation. Circulatory limitation could be due to coreg and valvular issues of the heart which I am told by Dr Einar Gip does not need surgical intervention at this time.   He is already better with improved asthma control  Offered pulmonary rehab but he prefers to try his own workout regimen and [employer] and reassess;  This is acceptable too  Will reassess clinically  Might need CPST again as followup depending on course  (> 50% of this 15 min visit spent in face to face counseling)

## 2013-08-12 NOTE — Patient Instructions (Addendum)
#  Cough  - this is due to acid reflux and asthma. Both help each other make cough worse  - For acid reflux  - stop fish oil  - take pepcid daily  - avoid fries, oily food, spicy food, bread, cheese, spirits  - go to bed only 3h after last meal - For Asthma  - take advair 100/50, 1 puff twice daily   #Shortness of breath  - this is due to asthma and circulatory (heart) limitation  - no evidence of cardiac ischemia  - will let Dr Einar Gip know of results  - will monitor this from pulmonary perspective - considers  Serial cpst depending on course  #Followup  6 months or sooner if needed

## 2013-08-12 NOTE — Assessment & Plan Note (Addendum)
Sum of evidence suggests cough is due to post viral, + asthma + GERD.\   Asthma dx is based on baseline PFT showing positive bronchodilator response, borderline FeNO at Dr Remus Blake office and CPST test positive for asthma  GERD dx is based on nocturnal cough + prior GERD hx + relief with pepcid + fact he is on fish oil  Cough is now resolved but he thinks he can come off medications ina few days  PLAN Advised that he needs to stick to both asthma medications and GERD medications  I have explained this to him in no uncertain terms that for several months he needs to take advair atleast 1 puff twice daily for the 100/50 dose or 1 puf daily at the 200/50 dose  Explained he needs to eat lean diet free of bread, spices, spirits  Needs to go to bed 3h after meal  Continue pepcid 20mg  daily at night  DC fish oil  He is agreeable to plan

## 2013-08-12 NOTE — Progress Notes (Signed)
Subjective:    Patient ID: [name], male    DOB: 09/03/1940, 73 y.o.   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   Ongoing mild aortic regurgitation    OV 08/12/2013  In interim over easter he got admitted for urosepsis. So his cough and dyspnea workup has been delayed.  He also canceled his [place] trip  At last visit we were hoping to delineate cough etiology (baseline dry cough that was worst at night): PFTs which did not have [place] standards suggested mixed restriction-obstruction wbut with positive BD respinse. So we did FeNO at Dr Vivi Martens which was 15. This is borderline for asthma. So I recommended advair and dc coreg. He only partially dc'ed coreg a week or so ago but finally stopped it completely 08/10/13 after seeing his PCP [name], MD. Also, several days ago he gave himself a  Trial of GERD Rx with pepcid and tums. This was self directed after he noticed gurgling noise at night (note: in past he was somewhat reluctant to accept gerd as potential etiology for cough)/ .  With these measures past several days says cough completely resolved especially in past week   Of note: A)  in terms of sinus: this is not an issue for him and he is not using his nasal steroid. B) In terms of GERD: he is taking pepcid and tums as above but wants to stop it soon even though it has helped him.  I did not he is on fish oil (this can make gerd worse). C)  In terms of asthma: on advair but taking it only once a day (CPST bike test below was positive for asthma). D)  In terms of BP: currently on losartan and bystolic for BP. Of note, he is not   Dyspnea/Fatigue: part of this workup was to get CPST but this got delayed due to  admission for urosepsis over easter which could have explained his fatigue. HE always never complained of dyspnea; it was a family complaint based on his breathing pattern when supine (presumably due to his right diaphragm paralysis).  He only had fatigue on gym  treadmill above a certain incline.   CPST test 08/06/13 (below) done while he was taking his half his usual coreg and while he still had cough showed - poor exercise capacity (52%) with lowered anaerobic threshld. He was wheezing bilaterally at end of test and had significant drop in his Fev1 ; confirming exercise induced asthma. In additon, he had circulatory limitation in terms of a heart rate gap and flat stroke volume. There was NO ischemic response  Review of Systems  Constitutional: Negative for fever and unexpected weight change.  HENT: Negative for congestion, dental problem, ear pain, nosebleeds, postnasal drip, rhinorrhea, sinus pressure, sneezing, sore throat and trouble swallowing.   Eyes: Negative for redness and itching.  Respiratory: Negative for cough, chest tightness, shortness of breath and wheezing.   Cardiovascular: Negative for palpitations and leg swelling.  Gastrointestinal: Negative for nausea and vomiting.  Genitourinary: Negative for dysuria.  Musculoskeletal: Negative for joint swelling.  Skin: Negative for rash.  Neurological: Negative for headaches.  Hematological: Does not bruise/bleed easily.  Psychiatric/Behavioral: Negative for dysphoric mood. The patient is  not nervous/anxious.        Objective:   Physical Exam  Nursing note and vitals reviewed. Constitutional: He is oriented to person, place, and time. He appears well-developed and well-nourished. No distress.  HENT:  Head: Normocephalic and atraumatic.  Right Ear: External ear normal.  Left Ear: External ear normal.  Mouth/Throat: Oropharynx is clear and moist. No oropharyngeal exudate.  Eyes: Conjunctivae and EOM are normal. Pupils are equal,  round, and reactive to light. Right eye exhibits no discharge. Left eye exhibits no discharge. No scleral icterus.  Neck: Normal range of motion. Neck supple. No JVD present. No tracheal deviation present. No thyromegaly present.  Cardiovascular: Normal rate, regular rhythm and intact distal pulses.  Exam reveals no gallop and no friction rub.   Murmur heard. Murmur  ? Softer than before. Murmur of AR  Pulmonary/Chest: Effort normal and breath sounds normal. No respiratory distress. He has no wheezes. He has no rales. He exhibits no tenderness.  Abdominal: Soft. Bowel sounds are normal. He exhibits no distension and no mass. There is no tenderness. There is no rebound and no guarding.  Musculoskeletal: Normal range of motion. He exhibits no edema and no tenderness.  Lymphadenopathy:    He has no cervical adenopathy.  Neurological: He is alert and oriented to person, place, and time. He has normal reflexes. No cranial nerve deficit. Coordination normal.  Skin: Skin is warm and dry. No rash noted. He is not diaphoretic. No erythema. No pallor.  Psychiatric: He has a normal mood and affect. His behavior is normal. Judgment and thought content normal.    Filed Vitals:   08/12/13 0907  BP: 122/70  Pulse: 64  Temp: 96.7 F (35.9 C)  TempSrc: Oral  Height: 5\' 8"  (1.727 m)  Weight: 73.483 kg (162 lb)  SpO2: 99%     Referred for: Dyspnea and cough  Procedure: This patient underwent staged symptom-limited exercise testing using an individualized bike protocol with expired gas analysis metabolic evaluation during exercise.  Demographics  Age: 73 Ht. (in.) 67.5 Wt. (lb) 161 BMI: 24.8 Predicted Peak VO2: 23.9 ml/kg/min  Gender: Male Ht (cm) 171.5 Wt. (kg) 73    Results  Pre-Exercise PFTs   FVC 1.82 L   FEV1 1.44 L   FEV1/FVC 79%   MVV 59 L/min  Post-Exercise PFTs (from lowest post-exercise trial (%change from rest))  FVC 1.65 (-9%)   FEV1 1.22 (-14%)   FEV1/FVC 74%     Exercise Time: 6:34 Watts: 72  RPE: 20  Reason stopped: Patient stopped due to left leg fatigue (5/10)  Additional symptoms: None reported  Resting HR: 66 Peak HR: 106 (72% age predicted max HR)  BP rest: 138/74 BP peak: 154/80  Peak VO2: 12.5 (52.2% predicted peak VO2)  VE/VCO2 slope: 21.5  OUES: 1.19  Peak RER: 1.26  Ventilatory Threshold: 9.3 (38.9% predicted peak VO2)  Peak RR 39  Peak Ventilation: 36.7  VE/MVV: 62.7%  PETCO2 at peak: 36  O2pulse: 9 (75% predicted O2pulse)   Interpretation  Notes: Patient gave a very good effort. The pulse-oximetry remained at 99% throughout the exercise. Exercise was performed on a cycle ergometer starting at [facility] and increasing by 12 W/min. NOTE: Near peak exercise the patient's lungs were auscultated and a very pronounced rhonchi was noted, bilaterally. IPE, patient had a short bout of coughing (just prior to PFT). Lung sounds were auscultated following IPE PFT with significantly reduced rhonchial sound (right base > left base), with a wheeze in left  base at end-expiration.   ECG: Resting ECG in sinus rhythm. There was an inadequate HR response to the exercise with no ST-T changes or arrhythmias. The BP response was appropriate.  PFT: Resting spirometry demonstrates a restrictive pattern. The MVV reflects the measured MVV. Post-exercise spirometry was performed IPE, 5 (reported above), 10 and 15 minutes post-exercise demonstrates a significant reduction in FEV1 suggestive of EIB.   CPX: The RER of 1.26 indicates a maximal effort. The peak VO2 is moderately reduced at 12.5 ml/kg/min (52% of the age/gender/weight matched sedentary norm). The VO2 a the ventilatory threshold is below normal at 39% of the predicted peak VO2. At peak exercise the ventilation was 63% of the measured MVV indicating ventilatory reserve remained (respiratory rate was within expected range, Vt/IC [66%] was within the expected range,  PETCO2 was normal). As mentioned above there was an inadequate HR response to the exercise with a HR reserve of 42 bpm. The O2pulse (a surrogate for stroke volume) remained essentially unchanged throughout the exercise at about 9 ml/beat (75% of predicted). The VE/VCO2 slope is was below normal and suggests a relative hypoventilation. The oxygen uptake efficiency slope (OUES) is mildly reduced and suggest a higher potential functional capacity.  Conclusion: Exercise testing with gas exchange demonstrates a moderate functional limitation when compared to matched sedentary norms. The patient's functional limitation is likely multi-factorial. The flattened O2pulse is concerning for a circulatory limitation. Post-exercise spirometry was positive for EIB.    Report prepared by: Linzie Collin, PhD, ACSM-RCEP Sr. Exercise Physiologist 08/06/2013 4:04 PM    Finalized and staffed by  Dr. Brand Males, M.D., [facility].C.P Pulmonary and Critical Care Medicine Staff Physician [facility] Pager: [phone], If no answer or between 15:00h - 7:00h: call [phone]         Assessment & Plan:

## 2014-01-27 ENCOUNTER — Encounter: Payer: Self-pay | Admitting: Internal Medicine

## 2014-01-27 ENCOUNTER — Ambulatory Visit (INDEPENDENT_AMBULATORY_CARE_PROVIDER_SITE_OTHER): Payer: Medicare Other | Admitting: Internal Medicine

## 2014-01-27 VITALS — BP 124/80 | HR 58 | Ht 67.5 in | Wt 171.8 lb

## 2014-01-27 DIAGNOSIS — J4540 Moderate persistent asthma, uncomplicated: Secondary | ICD-10-CM | POA: Insufficient documentation

## 2014-01-27 DIAGNOSIS — J45909 Unspecified asthma, uncomplicated: Secondary | ICD-10-CM | POA: Insufficient documentation

## 2014-01-27 DIAGNOSIS — R0600 Dyspnea, unspecified: Secondary | ICD-10-CM

## 2014-01-27 NOTE — Progress Notes (Signed)
Subjective:    Patient ID: [name], male    DOB: 02/03/1941, 73 y.o.   MRN: [id number]  HPI   #Mitral valve replacement 2011,    #S/p RCA stent for asymptomatic > 99%  RCA stenosis in 2011 prior to MVR.     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 in 2011),   #chronic multi-year waxing and waning voice hoarseness  - per hx 2014 workup at [facility] - Dx with LPR and s/p voice rehab  - symptoms improve but not resolve with H2 blockade or PPI  #chronic cough end 2014 through spring 2015 post [place] trip  -due to  GERD, post viral reactive airway and asthma  -  advair started April 2015  #Fatigue / dyspnea end 2014 through summer 2015 - following viral infecion end 2014 [place] trip and then urosepsis admission at [facility] March/early April 2015  - No ILD CT chest 07/13/13   - CPST test 08/06/13 (below) done while he was taking his half his usual coreg and while he still had cough showed - poor exercise capacity (52%) with lowered anaerobic threshld. He was wheezing bilaterally at end of test and had significant drop in his Fev1 ; confirming exercise induced asthma. In additon, he had circulatory limitation in terms of a heart rate gap and flat stroke volume. There was NO ischemic response   #asthma   -diagnosed spring 2015 in midst of fatigue, cough, dyspnea following viral infection end 2014 [place] trop   -on basis of abnormal PFT, CPST showing EIB and FeNO > 25 at [place] - spring 2015  #Micropulm nodules - seen CT 07/13/13   OV 01/27/2014  [name] returns for followup of chronic hoarseness, chronic cough, and asthma.  LAst visit advised advair, and aggressive gerd precaution (no fish oil, ppi and lean diet) and strick compliance with same . It appers he took his advair for a while. THis helped cough and then cough resolved. AFter this he has been only 25% compliant with  advair taking it on days when he i s abit more symptomatic with cough and hoarseness. Marland Kitchen His fatigue and dyspnea though seems independent of the cough and hoarseness and was related to his urosepsis in April 2015 and this has resolved as he reovered from it. He has been exercising in gym and feeling well. He is still bothered bu the hoarse voice and is random though H2 blockade or PPI helps. However, he feels he does not need these meds on daily basis.  Spirometry today shows significant decline in lung function cmpared to [facility] 2015. Fev1 0;96L/31%, ratio 69 (baseline march 2015 pre-bd was 1.3L/44, ratio 75 with 15% Post BD response to an fev1 1.47L/51%).  Exhaled NO - 26 and c/w asthma  He is reluctant to take MDIs. Says he wants data and feels it should be data that he charts at home in a tabular column based However, after hearing of lung function decline he is more acceptable to take his mdi on a regular basis.    Past, Family, Social reviewed: he is once again planning [place] trip 02/28/14   Review of Systems  Constitutional: Negative for fever and unexpected weight change.  HENT: Negative for congestion, dental problem, ear pain, nosebleeds, postnasal drip, rhinorrhea, sinus pressure, sneezing, sore throat and trouble swallowing.   Eyes: Negative for redness and itching.  Respiratory: Negative for cough, chest tightness, shortness  of breath and wheezing.   Cardiovascular: Negative for palpitations and leg swelling.  Gastrointestinal: Negative for nausea and vomiting.  Genitourinary: Negative for dysuria.  Musculoskeletal: Negative for joint swelling.  Skin: Negative for rash.  Neurological: Negative for headaches.  Hematological: Does not bruise/bleed easily.  Psychiatric/Behavioral: Negative for dysphoric mood. The patient is not nervous/anxious.    Current outpatient prescriptions:aspirin 81 MG tablet, Take 81 mg by mouth daily.  , Disp: , Rfl: ;  calcium-vitamin D (OSCAL WITH D)  500-200 MG-UNIT per tablet, Take 2 tablets by mouth daily.  , Disp: , Rfl: ;  cetirizine (ZYRTEC) 10 MG tablet, Take 10 mg by mouth daily.  , Disp: , Rfl: ;  Cholecalciferol (VITAMIN D3) 1000 UNITS CAPS, Take 1 capsule by mouth daily., Disp: , Rfl:  Coenzyme Q10 (CO Q 10) 100 MG CAPS, Take 1 capsule by mouth daily., Disp: , Rfl: ;  famotidine (PEPCID) 10 MG tablet, Take 10 mg by mouth daily. , Disp: , Rfl: ;  Fluticasone-Salmeterol (ADVAIR) 100-50 MCG/DOSE AEPB, Inhale 1 puff into the lungs 2 (two) times daily., Disp: , Rfl: ;  losartan (COZAAR) 50 MG tablet, Take 50 mg by mouth daily. , Disp: , Rfl: ;  Magnesium 400 MG CAPS, Take 1 capsule by mouth daily., Disp: , Rfl:  nebivolol (BYSTOLIC) 10 MG tablet, Take 10 mg by mouth daily., Disp: , Rfl: ;  Probiotic Product (PROBIOTIC PO), Take 1 tablet by mouth daily., Disp: , Rfl: ;  rosuvastatin (CRESTOR) 10 MG tablet, Take 10 mg by mouth daily., Disp: , Rfl: ;  tamsulosin (FLOMAX) 0.4 MG CAPS capsule, Take 1 capsule (0.4 mg total) by mouth daily., Disp: 30 capsule, Rfl: 0     Objective:   Physical Exam  Nursing note and vitals reviewed. Constitutional: He is oriented to person, place, and time. He appears well-developed and well-nourished. No distress.  HENT:  Head: Normocephalic and atraumatic.  Right Ear: External ear normal.  Left Ear: External ear normal.  Mouth/Throat: Oropharynx is clear and moist. No oropharyngeal exudate.  Voice hoarse  Eyes: Conjunctivae and EOM are normal. Pupils are equal, round, and reactive to light. Right eye exhibits no discharge. Left eye exhibits no discharge. No scleral icterus.  Neck: Normal range of motion. Neck supple. No JVD present. No tracheal deviation present. No thyromegaly present.  Cardiovascular: Normal rate, regular rhythm and intact distal pulses.  Exam reveals no gallop and no friction rub.   No murmur heard. Pulmonary/Chest: Effort normal and breath sounds normal. No respiratory distress. He has no  wheezes. He has no rales. He exhibits no tenderness.  Abdominal: Soft. Bowel sounds are normal. He exhibits no distension and no mass. There is no tenderness. There is no rebound and no guarding.  Musculoskeletal: Normal range of motion. He exhibits no edema and no tenderness.  Lymphadenopathy:    He has no cervical adenopathy.  Neurological: He is alert and oriented to person, place, and time. He has normal reflexes. No cranial nerve deficit. Coordination normal.  Skin: Skin is warm and dry. No rash noted. He is not diaphoretic. No erythema. No pallor.  Psychiatric: He has a normal mood and affect. His behavior is normal. Judgment and thought content normal.    Filed Vitals:   01/27/14 1104  BP: 124/80  Pulse: 58  Height: 5' 7.5" (1.715 m)  Weight: 171 lb 12.8 oz (77.928 kg)  SpO2: 98%         Assessment & Plan:  He has baseline  low lung function that he states he has known for years. Some of this I thik is related to [place] ethnicity and paralyzed right diaphragm and some of this is asthma, These 2 can combined explain baseline post BD fev1 of 1.47L/47%. Anything below this is likely from asthma I would suspect. Today based on drop in fev1 and exhaled NO > 25, that his asthma is active.  I explained as such.   Lung function very low  Restart advair 100/50 , 1 puff bid  Wil see him again with spirometry before he heads out  To [place]   Worried about his lung function given his non-compliance and desire to self direct Rx      Dr. Brand Males, M.D., F.C.C.P Pulmonary and Critical Care Medicine Staff Physician [facility] Pager: [phone], If no answer or between  15:00h - 7:00h: call [phone]  01/27/2014 4:06 PM

## 2014-01-27 NOTE — Patient Instructions (Addendum)
#  ASthma  - appears active based on exhaled nitric oxide test and spirometry test  - please restart ADVAIR 100/50, 1 puff twice daily -use albuterol 2 puff prn   #Followup  - return before February 24, 2014 to see me  - at followup check spirometry and if possible exhaled nitric oxide

## 2014-02-22 ENCOUNTER — Encounter: Payer: Self-pay | Admitting: Internal Medicine

## 2014-02-22 ENCOUNTER — Ambulatory Visit (INDEPENDENT_AMBULATORY_CARE_PROVIDER_SITE_OTHER): Payer: Medicare Other | Admitting: Internal Medicine

## 2014-02-22 VITALS — BP 138/78 | HR 63 | Ht 67.0 in | Wt 172.0 lb

## 2014-02-22 DIAGNOSIS — J4540 Moderate persistent asthma, uncomplicated: Secondary | ICD-10-CM

## 2014-02-22 MED ORDER — ALBUTEROL SULFATE HFA 108 (90 BASE) MCG/ACT IN AERS: 2.0000 | INHALATION_SPRAY | Freq: Four times a day (QID) | RESPIRATORY_TRACT | Status: DC | PRN

## 2014-02-22 NOTE — Progress Notes (Signed)
Subjective:    Patient ID: [name], male    DOB: [birth year], 73 y.o.   MRN: [id number]  HPI  #Mitral valve replacement 2011,    #S/p RCA stent for asymptomatic > 99%  RCA stenosis in 2011 prior to MVR.     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 in 2011),   #chronic multi-year waxing and waning voice hoarseness  - per hx 2014 workup at [facility] ENT - Dx with LPR and s/p voice rehab  - symptoms improve but not resolve with H2 blockade or PPI  #chronic cough end 2014 through spring 2015 post [place] trip  -due to  GERD, post viral reactive airway and asthma  -  advair started April 2015  #Fatigue / dyspnea end 2014 through summer 2015 - following viral infecion end 2014 [place] trip and then urosepsis admission at [facility] March/early April 2015  - No ILD CT chest 07/13/13   - CPST test 08/06/13 (below) done while he was taking his half his usual coreg and while he still had cough showed - poor exercise capacity (52%) with lowered anaerobic threshld. He was wheezing bilaterally at end of test and had significant drop in his Fev1 ; confirming exercise induced asthma. In additon, he had circulatory limitation in terms of a heart rate gap and flat stroke volume. There was NO ischemic response   #asthma   -diagnosed spring 2015 in midst of fatigue, cough, dyspnea following viral infection end 2014 [place] trop   -on basis of abnormal PFT, CPST showing EIB and FeNO > 25 at [place] - spring 2015  #Micropulm nodules - seen CT 07/13/13   OV 01/27/2014  [name] returns for followup of chronic hoarseness, chronic cough, and asthma.  LAst visit advised advair, and aggressive gerd precaution (no fish oil, ppi and lean diet) and strick compliance with same . It appers he took his advair for a while. THis helped cough and then cough resolved. AFter this he has been only 25% compliant with  advair taking it on days when he i s abit more symptomatic with cough and hoarseness. Marland Kitchen His fatigue and dyspnea though seems independent of the cough and hoarseness and was related to his urosepsis in April 2015 and this has resolved as he reovered from it. He has been exercising in gym and feeling well. He is still bothered bu the hoarse voice and is random though H2 blockade or PPI helps. However, he feels he does not need these meds on daily basis.  Spirometry today shows significant decline in lung function cmpared to [facility] 2015. Fev1 0;96L/31%, ratio 69 (baseline march 2015 pre-bd was 1.3L/44, ratio 75 with 15% Post BD response to an fev1 1.47L/51%).  Exhaled NO - 26 and c/w asthma  He is reluctant to take MDIs. Says he wants data and feels it should be data that he charts at home in a tabular column based However, after hearing of lung function decline he is more acceptable to take his mdi on a regular basis.    Past, Family, Social reviewed: he is once again planning [place] trip 02/28/14    OV 02/22/2014  Chief Complaint  Patient presents with  . Follow-up    Pt states his SOB has improved since last OV. Pt c/o hoarseness. Pt denies cough and CP/tightness.    Feels better; improved ET with stairs. But still doing advair 1 puff once daily  only. Due to go to [place] in few days. Will have flu shot with pcp. Spirometyr today shows: VOice up and down hoarse like before without change. Good with gargling. COuld not do spirometry today todue to malfunction in wifi networkds   Review of Systems  Constitutional: Negative for fever and unexpected weight change.  HENT: Positive for sore throat. Negative for congestion, dental problem, ear pain, nosebleeds, postnasal drip, rhinorrhea, sinus pressure, sneezing and trouble swallowing.   Eyes: Negative for redness and itching.  Respiratory: Negative for cough, chest tightness, shortness of breath and wheezing.   Cardiovascular: Negative for  palpitations and leg swelling.  Gastrointestinal: Negative for nausea and vomiting.  Genitourinary: Negative for dysuria.  Musculoskeletal: Negative for joint swelling.  Skin: Negative for rash.  Neurological: Negative for headaches.  Hematological: Does not bruise/bleed easily.  Psychiatric/Behavioral: Negative for dysphoric mood. The patient is not nervous/anxious.    Current outpatient prescriptions: aspirin 81 MG tablet, Take 81 mg by mouth daily.  , Disp: , Rfl: ;  calcium-vitamin D (OSCAL WITH D) 500-200 MG-UNIT per tablet, Take 2 tablets by mouth daily.  , Disp: , Rfl: ;  cetirizine (ZYRTEC) 10 MG tablet, Take 10 mg by mouth daily.  , Disp: , Rfl: ;  Cholecalciferol (VITAMIN D3) 1000 UNITS CAPS, Take 1 capsule by mouth daily., Disp: , Rfl:  Coenzyme Q10 (CO Q 10) 100 MG CAPS, Take 1 capsule by mouth daily., Disp: , Rfl: ;  famotidine (PEPCID) 10 MG tablet, Take 10 mg by mouth daily. , Disp: , Rfl: ;  Fluticasone-Salmeterol (ADVAIR) 100-50 MCG/DOSE AEPB, Inhale 1 puff into the lungs 2 (two) times daily., Disp: , Rfl: ;  losartan (COZAAR) 50 MG tablet, Take 50 mg by mouth daily. , Disp: , Rfl: ;  Magnesium 400 MG CAPS, Take 1 capsule by mouth daily., Disp: , Rfl:  nebivolol (BYSTOLIC) 10 MG tablet, Take 10 mg by mouth daily., Disp: , Rfl: ;  Probiotic Product (PROBIOTIC PO), Take 1 tablet by mouth daily., Disp: , Rfl: ;  rosuvastatin (CRESTOR) 10 MG tablet, Take 10 mg by mouth daily., Disp: , Rfl: ;  tamsulosin (FLOMAX) 0.4 MG CAPS capsule, Take 1 capsule (0.4 mg total) by mouth daily., Disp: 30 capsule, Rfl: 0     Objective:   Physical Exam  Filed Vitals:   02/22/14 1122  BP: 138/78  Pulse: 63  Height: 5\' 7"  (1.702 m)  Weight: 172 lb (78.019 kg)  SpO2: 98%   Nursing note and vitals reviewed. Constitutional: He is oriented to person, place, and time. He appears well-developed and well-nourished. No distress.  HENT:  Head: Normocephalic and atraumatic.  Right Ear: External ear  normal.  Left Ear: External ear normal.  Mouth/Throat: Oropharynx is clear and moist. No oropharyngeal exudate.  Voice hoarse  Eyes: Conjunctivae and EOM are normal. Pupils are equal, round, and reactive to light. Right eye exhibits no discharge. Left eye exhibits no discharge. No scleral icterus.  Neck: Normal range of motion. Neck supple. No JVD present. No tracheal deviation present. No thyromegaly present.  Cardiovascular: Normal rate, regular rhythm and intact distal pulses.  Exam reveals no gallop and no friction rub.   No murmur heard. Pulmonary/Chest: Effort normal and breath sounds normal. No respiratory distress. He has no wheezes. He has no rales. He exhibits no tenderness.  Abdominal: Soft. Bowel sounds are normal. He exhibits no distension and no mass. There is no tenderness. There is no rebound and no guarding.  Musculoskeletal: Normal range  of motion. He exhibits no edema and no tenderness.  Lymphadenopathy:    He has no cervical adenopathy.  Neurological: He is alert and oriented to person, place, and time. He has normal reflexes. No cranial nerve deficit. Coordination normal.  Skin: Skin is warm and dry. No rash noted. He is not diaphoretic. No erythema. No pallor.  Psychiatric: He has a normal mood and affect. His behavior is normal. Judgment and thought content normal.          Assessment & Plan:  ASthma - glad you are better  - continue ADVAIR 100/50, 1 puff twice daily without fail -use albuterol 2 puff prn - have flu shot with pcp [name], MD   #Followup  - return 3 months to see me  - at followup check spirometry and if possible exhaled nitric oxide    Dr. Brand Males, M.D., [facility].C.P Pulmonary and Critical Care Medicine Staff Physician [place] [facility] Pager: [phone], If no answer or between  15:00h - 7:00h: call [phone]  02/22/2014 11:57 AM

## 2014-02-22 NOTE — Patient Instructions (Addendum)
#  ASthma  - continue ADVAIR 100/50, 1 puff twice daily without fail -use albuterol 2 puff prn - have flu shot with pcp [name], MD   #Followup  - return 3 months to see me  - at followup check spirometry and if possible exhaled nitric oxide

## 2014-04-15 DIAGNOSIS — C801 Malignant (primary) neoplasm, unspecified: Secondary | ICD-10-CM

## 2014-04-15 HISTORY — PX: PROSTATECTOMY: SHX69

## 2014-04-15 HISTORY — DX: Malignant (primary) neoplasm, unspecified: C80.1

## 2014-04-25 DIAGNOSIS — R3912 Poor urinary stream: Secondary | ICD-10-CM | POA: Diagnosis not present

## 2014-04-25 DIAGNOSIS — R351 Nocturia: Secondary | ICD-10-CM | POA: Diagnosis not present

## 2014-04-25 DIAGNOSIS — Z87438 Personal history of other diseases of male genital organs: Secondary | ICD-10-CM | POA: Diagnosis not present

## 2014-05-18 DIAGNOSIS — J069 Acute upper respiratory infection, unspecified: Secondary | ICD-10-CM | POA: Diagnosis not present

## 2014-05-30 ENCOUNTER — Telehealth: Payer: Self-pay | Admitting: Internal Medicine

## 2014-05-30 NOTE — Telephone Encounter (Signed)
[name] (cc to [employer])  [name],- met his daughter over lunch and learned that he is sick on and off with cold and finding it hard to bounce back. He needs OV with spirometry on day of visit. Prefer this week of 05/30/14. Please put him in Tammy Parrett schedule - he is somewhat reluctant to see non-MD but I told him I do not have slot. IF his Fev1 number is down: he will need steroids short course. Depending on the visit, I might consider him for [employer] study on asthma  Also, hoarse voice is worse apparently per daughter - might need repeat ENT consult  Thanks  Dr. Brand Males, M.D., [facility].C.P Pulmonary and Critical Care Medicine Staff Physician [place] [facility] Pager: [phone], If no answer or between  15:00h - 7:00h: call [phone]  05/30/2014 2:08 AM

## 2014-05-31 ENCOUNTER — Ambulatory Visit (INDEPENDENT_AMBULATORY_CARE_PROVIDER_SITE_OTHER): Payer: Medicare Other | Admitting: Internal Medicine

## 2014-05-31 ENCOUNTER — Encounter: Payer: Self-pay | Admitting: Internal Medicine

## 2014-05-31 VITALS — BP 112/60 | HR 71 | Ht 67.0 in | Wt 170.6 lb

## 2014-05-31 DIAGNOSIS — J4540 Moderate persistent asthma, uncomplicated: Secondary | ICD-10-CM

## 2014-05-31 DIAGNOSIS — R05 Cough: Secondary | ICD-10-CM

## 2014-05-31 DIAGNOSIS — R059 Cough, unspecified: Secondary | ICD-10-CM

## 2014-05-31 DIAGNOSIS — R0600 Dyspnea, unspecified: Secondary | ICD-10-CM | POA: Diagnosis not present

## 2014-05-31 MED ORDER — FLUTICASONE-SALMETEROL 100-50 MCG/DOSE IN AEPB: 1.0000 | INHALATION_SPRAY | Freq: Two times a day (BID) | RESPIRATORY_TRACT | Status: DC

## 2014-05-31 MED ORDER — PREDNISONE 10 MG PO TABS: ORAL_TABLET | ORAL | Status: DC

## 2014-05-31 MED ORDER — ALBUTEROL SULFATE HFA 108 (90 BASE) MCG/ACT IN AERS: 2.0000 | INHALATION_SPRAY | Freq: Four times a day (QID) | RESPIRATORY_TRACT | Status: DC | PRN

## 2014-05-31 NOTE — Patient Instructions (Addendum)
ICD-9-CM ICD-10-CM   1. Asthma, moderate persistent, poorly-controlled 493.90 J45.40   2. Cough 786.2 R05 Spirometry with Graph  3. Dyspnea 786.09 R06.00    Asthma - likely active  - .Take prednisone 40 mg daily x 2 days, then 20mg  daily x 2 days, then 10mg  daily x 2 days, then 5mg  daily x 2 days and stop - do spirometry pre- and post BD in 4 days; call me once done to see response to steroids - continue advair 100/50, 1 puff twice daily - take script/sample, might adjust dose depending on response to prednisone - albuterol 2 puff prn  #Shortness of breath  - while at exercise - use pulse oximeter - keep HR < 130 and ensure pulse ox levels > 90%   #Followup  will call with spirometry results Any respiratory issues try to call our office  3 months or sooner if needed

## 2014-05-31 NOTE — Progress Notes (Signed)
Subjective:    Patient ID: [name], male    DOB: [birth year], 74 y.o.   MRN: [id number]  HPI  HPI  #Mitral valve replacement 2011,    #S/p RCA stent for asymptomatic > 99%  RCA stenosis in 2011 prior to MVR.     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 in 2011),   #chronic multi-year waxing and waning voice hoarseness  - per hx 2014 workup at [facility] ENT - Dx with LPR and s/p voice rehab  - symptoms improve but not resolve with H2 blockade or PPI  #chronic cough end 2014 through spring 2015 post [place] trip  -due to  GERD, post viral reactive airway and asthma  -  advair started April 2015  #Fatigue / dyspnea end 2014 through summer 2015 - following viral infecion end 2014 [place] trip and then urosepsis admission at [facility] March/early April 2015  - No ILD CT chest 07/13/13   - CPST test 08/06/13 (below) done while he was taking his half his usual coreg and while he still had cough showed - poor exercise capacity (52%) with lowered anaerobic threshld. He was wheezing bilaterally at end of test and had significant drop in his Fev1 ; confirming exercise induced asthma. In additon, he had circulatory limitation in terms of a heart rate gap and flat stroke volume. There was NO ischemic response   #asthma   -diagnosed spring 2015 in midst of fatigue, cough, dyspnea following viral infection end 2014 [place] trop   -on basis of abnormal PFT, CPST showing EIB and FeNO > 25 at [place] - spring 2015  #Micropulm nodules - seen CT 07/13/13   OV 01/27/2014  [name] returns for followup of chronic hoarseness, chronic cough, and asthma.  LAst visit advised advair, and aggressive gerd precaution (no fish oil, ppi and lean diet) and strick compliance with same . It appers he took his advair for a while. THis helped cough and then cough resolved. AFter this he has been only 25% compliant  with advair taking it on days when he i s abit more symptomatic with cough and hoarseness. Marland Kitchen His fatigue and dyspnea though seems independent of the cough and hoarseness and was related to his urosepsis in April 2015 and this has resolved as he reovered from it. He has been exercising in gym and feeling well. He is still bothered bu the hoarse voice and is random though H2 blockade or PPI helps. However, he feels he does not need these meds on daily basis.  Spirometry today shows significant decline in lung function cmpared to [facility] 2015. Fev1 0;96L/31%, ratio 69 (baseline march 2015 pre-bd was 1.3L/44, ratio 75 with 15% Post BD response to an fev1 1.47L/51%).  Exhaled NO - 26 and c/w asthma  He is reluctant to take MDIs. Says he wants data and feels it should be data that he charts at home in a tabular column based However, after hearing of lung function decline he is more acceptable to take his mdi on a regular basis.    Past, Family, Social reviewed: he is once again planning [place] trip 02/28/14    OV 02/22/2014  Chief Complaint  Patient presents with  . Follow-up    Pt states his SOB has improved since last OV. Pt c/o hoarseness. Pt denies cough and CP/tightness.    Feels better; improved ET with stairs. But still doing advair 1 puff  once daily only. Due to go to [place] in few days. Will have flu shot with pcp. Spirometyr today shows: VOice up and down hoarse like before without change. Good with gargling. COuld not do spirometry today todue to malfunction in wifi networkds    AcuteOV 05/31/2014   [name] presents for follow-up. I met his daughter socially a few days ago and there was concern that when he picks up a viral infection that he has a hard time bouncing back. In addition daughter was concerned about persistent hoarseness of the voice. Last saw patient in November 2015 prior to [place] trip. At that visit I insisted on continued Advair compliance. He said he did the same  and did not have any respiratory problems during the trip to [place].  Now for the lastfew weeks he picked upper respiratory infection with a lot of mucus drainage. A week and with illness he was seen at his primary care physician's office and was given Z-Pak. He is not so sure the Z-Pak helped him or he improved spontaneously. In balance he thinks that some of the mucus drainage might have improved with a Z-Pak. Currently he says that he is back to his baseline health. His asthma control questionnaire 5. Scale average is 0.6 showing good control of asthma. He says he is compliant with his Advair and does not miss any dose. His chronic hoarseness continues with waxing and waning quality. We again went over this. 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 He does not feel that that is another need for repeat ENT evaluation.  The main concerning thing today is that his lung function continues to be severely restrictive/obstructive. It is improved compared to fall 2015 since he went on Advair but only a little bit. It still reduced compared to a year ago in March 2015. However he states that he does not have any limitations with his daily exercise routine.     PFT data March 2015  Oct 2015 05/31/2014   Fev1 1.3L/44,% -> 1.47L 0.96/39% 1.1/37%  FVC   1.6L/40%  Ratio 75 69 70  fef 25-75%     TLC     DLCO     ACQ-5   0.6    Rec to restart advair       Review of Systems  Constitutional: Negative for fever and unexpected weight change.  HENT: Negative for congestion, dental problem, ear pain, nosebleeds, postnasal drip, rhinorrhea, sinus pressure, sneezing, sore throat and trouble swallowing.   Eyes: Negative for redness and itching.  Respiratory: Positive for cough. Negative for chest tightness, shortness of breath and wheezing.   Cardiovascular: Negative for palpitations and leg swelling.    Gastrointestinal: Negative for nausea and vomiting.  Genitourinary: Negative for dysuria.  Musculoskeletal: Negative for joint swelling.  Skin: Negative for rash.  Neurological: Negative for headaches.  Hematological: Does not bruise/bleed easily.  Psychiatric/Behavioral: Negative for dysphoric mood. The patient is not nervous/anxious.        Objective:   Physical Exam  Constitutional: He is oriented to person, place, and time. He appears well-developed and well-nourished. No distress.  HENT:  Head: Normocephalic and atraumatic.  Right Ear: External ear normal.  Left Ear: External ear normal.  Mouth/Throat: Oropharynx is clear and moist. No oropharyngeal exudate.  Eyes: Conjunctivae and EOM are normal. Pupils are equal, round, and reactive to light. Right eye exhibits no discharge. Left eye exhibits  no discharge. No scleral icterus.  Neck: Normal range of motion. Neck supple. No JVD present. No tracheal deviation present. No thyromegaly present.  Cardiovascular: Normal rate, regular rhythm and intact distal pulses.  Exam reveals no gallop and no friction rub.   No murmur heard. Pulmonary/Chest: Effort normal and breath sounds normal. No respiratory distress. He has no wheezes. He has no rales. He exhibits no tenderness.  Abdominal: Soft. Bowel sounds are normal. He exhibits no distension and no mass. There is no tenderness. There is no rebound and no guarding.  Musculoskeletal: Normal range of motion. He exhibits no edema or tenderness.  Lymphadenopathy:    He has no cervical adenopathy.  Neurological: He is alert and oriented to person, place, and time. He has normal reflexes. No cranial nerve deficit. Coordination normal.  Skin: Skin is warm and dry. No rash noted. He is not diaphoretic. No erythema. No pallor.  Psychiatric: He has a normal mood and affect. His behavior is normal. Judgment and thought content normal.  Nursing note and vitals reviewed.   Filed Vitals:   05/31/14  1549  BP: 112/60  Pulse: 71  Height: 5' 7"  (1.702 m)  Weight: 77.384 kg (170 lb 9.6 oz)  SpO2: 98%         Assessment & Plan:     ICD-9-CM ICD-10-CM   1. Asthma, moderate persistent, poorly-controlled 493.90 J45.40   2. Cough 786.2 R05 Spirometry with Graph  3. Dyspnea 786.09 R06.00    Subjectively his asthma is controlled. However objectively he continues to have severe reductions in lung function. Some reduction in lung function can be explained by his paralyzed diaphragm and possibly  By th\e  [place] ethnicity. However,. I am not sure if there is element of uncontrolled asthma (under perceived) that is going on  despite stated compliance.I want to see if any of this has reversible component. REcommend trial of short course steroids. Discussed side effects of 8d prednisone taper - he understands them and willing to try it out. Will get spirometry in 4-8 day time frame during steroid Rx to capture this.   He also has exercise related questions - gave him objective parameters to follow with HR and pulse ox    Dr. Brand Males, M.D., [facility].C.P Pulmonary and Critical Care Medicine Staff Physician [place] [facility] Pager: [phone], If no answer or between  15:00h - 7:00h: call [phone]  05/31/2014 5:41 PM

## 2014-05-31 NOTE — Telephone Encounter (Signed)
Pt returned call  [phone] or call [phone]

## 2014-05-31 NOTE — Telephone Encounter (Signed)
Called and spoke to pt. Appt made with MR today at 4pm. Pt verbalized understanding and denied any further questions or concerns at this time.

## 2014-05-31 NOTE — Telephone Encounter (Signed)
lmtcb for pt.  

## 2014-06-06 ENCOUNTER — Ambulatory Visit (HOSPITAL_COMMUNITY)
Admission: RE | Admit: 2014-06-06 | Discharge: 2014-06-06 | Disposition: A | Discharge status: Home / Self Care | Payer: Medicare Other | Source: Ambulatory Visit | Attending: Internal Medicine | Admitting: Internal Medicine

## 2014-06-06 DIAGNOSIS — J4540 Moderate persistent asthma, uncomplicated: Secondary | ICD-10-CM | POA: Diagnosis not present

## 2014-06-06 DIAGNOSIS — R059 Cough, unspecified: Secondary | ICD-10-CM

## 2014-06-06 DIAGNOSIS — R05 Cough: Secondary | ICD-10-CM | POA: Insufficient documentation

## 2014-06-06 LAB — PULMONARY FUNCTION TEST
FEF 25-75 Post: 1.02 L/sec
FEF 25-75 Pre: 0.78 L/sec
FEF2575-%Change-Post: 31 %
FEF2575-%Pred-Post: 48 %
FEF2575-%Pred-Pre: 36 %
FEV1-%Change-Post: 6 %
FEV1-%Pred-Post: 46 %
FEV1-%Pred-Pre: 43 %
FEV1-Post: 1.34 L
FEV1-Pre: 1.26 L
FEV1FVC-%Change-Post: 9 %
FEV1FVC-%Pred-Pre: 98 %
FEV6-%Change-Post: -2 %
FEV6-%Pred-Post: 45 %
FEV6-%Pred-Pre: 46 %
FEV6-Post: 1.7 L
FEV6-Pre: 1.74 L
FEV6FVC-%Change-Post: 0 %
FEV6FVC-%Pred-Post: 106 %
FEV6FVC-%Pred-Pre: 105 %
FVC-%Change-Post: -2 %
FVC-%Pred-Post: 42 %
FVC-%Pred-Pre: 44 %
FVC-Post: 1.7 L
FVC-Pre: 1.75 L
Post FEV1/FVC ratio: 79 %
Post FEV6/FVC ratio: 100 %
Pre FEV1/FVC ratio: 72 %
Pre FEV6/FVC Ratio: 100 %

## 2014-06-06 MED ORDER — ALBUTEROL SULFATE (2.5 MG/3ML) 0.083% IN NEBU
2.5000 mg | INHALATION_SOLUTION | Freq: Once | RESPIRATORY_TRACT | Status: AC
  Administered 2014-06-06: 2.5 mg via RESPIRATORY_TRACT

## 2014-06-08 ENCOUNTER — Telehealth: Payer: Self-pay | Admitting: Internal Medicine

## 2014-06-08 MED ORDER — MONTELUKAST SODIUM 10 MG PO TABS: 10.0000 mg | ORAL_TABLET | Freq: Every day | ORAL | Status: DC

## 2014-06-08 NOTE — Telephone Encounter (Signed)
Called and spoke to pt. Informed pt of the recs per MR. Rx of Singulair sent to preferred pharmacy. Sample of Breo left up front for demonstration and appt made with MR on 4/25. Pt verbalized understanding and denied any further questions or concerns at this time.

## 2014-06-08 NOTE — Telephone Encounter (Signed)
Let patient know he has hada 12.5% improvement in lung fucntion with the prednisone. I recommend  A) stop all his advair samples - but do not throw it away B) come to office and get BREO high dose sample 1 puff daily C) use albuterol (proair or ventolin)  2 puff prn D) start singulair 10mg  qhs  FU 8 weeks with spirometry at our office   Dr. Brand Males, M.D., [facility].C.P Pulmonary and Critical Care Medicine Staff Physician [place] [facility] Pager: [phone], If no answer or between  15:00h - 7:00h: call [phone]  06/08/2014 3:52 PM

## 2014-06-24 ENCOUNTER — Other Ambulatory Visit: Payer: Self-pay | Admitting: *Deleted

## 2014-06-24 MED ORDER — FLUTICASONE FUROATE-VILANTEROL 200-25 MCG/INH IN AEPB
1.0000 | INHALATION_SPRAY | Freq: Every day | RESPIRATORY_TRACT | Status: DC
Start: 2014-06-24 — End: 2014-10-21

## 2014-07-12 ENCOUNTER — Telehealth: Payer: Self-pay | Admitting: Internal Medicine

## 2014-07-12 NOTE — Telephone Encounter (Signed)
Daneil Dan  Can you give some samples of BREO please for RAJAT STAVER to last till OV 08/08/14. When he comes he will need FeNO. Please acknowledge action on this so I can text him back   Thanks  Dr. Brand Males, M.D., [facility].C.P Pulmonary and Critical Care Medicine Staff Physician [place] [facility] Pager: [phone], If no answer or between  15:00h - 7:00h: call [phone]  07/12/2014 11:21 AM   g

## 2014-07-12 NOTE — Telephone Encounter (Signed)
I Sent him message but probably worth if you call him as well

## 2014-07-12 NOTE — Telephone Encounter (Signed)
I have left samples up front for pick up for him. He will just need to come to desks at front and let them know he is picking up samples.

## 2014-07-21 DIAGNOSIS — S92324A Nondisplaced fracture of second metatarsal bone, right foot, initial encounter for closed fracture: Secondary | ICD-10-CM | POA: Diagnosis not present

## 2014-08-04 DIAGNOSIS — S92324P Nondisplaced fracture of second metatarsal bone, right foot, subsequent encounter for fracture with malunion: Secondary | ICD-10-CM | POA: Diagnosis not present

## 2014-08-08 ENCOUNTER — Ambulatory Visit: Payer: Medicare Other | Admitting: Internal Medicine

## 2014-08-08 ENCOUNTER — Encounter: Payer: Self-pay | Admitting: Internal Medicine

## 2014-08-08 ENCOUNTER — Ambulatory Visit (INDEPENDENT_AMBULATORY_CARE_PROVIDER_SITE_OTHER): Payer: Medicare Other | Admitting: Internal Medicine

## 2014-08-08 VITALS — BP 108/60 | HR 66 | Ht 67.0 in | Wt 173.0 lb

## 2014-08-08 DIAGNOSIS — J4540 Moderate persistent asthma, uncomplicated: Secondary | ICD-10-CM | POA: Diagnosis not present

## 2014-08-08 NOTE — Progress Notes (Signed)
Subjective:    Patient ID: [name], male    DOB: 05/27/1940, 74 y.o.   MRN: [id number]  HPI     #Mitral valve replacement 2011,    #S/p RCA stent for asymptomatic > 99%  RCA stenosis in 2011 prior to MVR.     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 in 2011),   #chronic multi-year waxing and waning voice hoarseness  - per hx 2014 workup at [facility]NT - Dx with LPR and s/p voice rehab  - symptoms improve but not resolve with H2 blockade or PPI  #chronic cough end 2014 through spring 2015 post [place] trip  -due to  GERD, post viral reactive airway and asthma  -  advair started April 2015  #Fatigue / dyspnea end 2014 through summer 2015 - following viral infecion end 2014 [place] trip and then urosepsis admission at [facility] March/early April 2015  - No ILD CT chest 07/13/13   - CPST test 08/06/13 (below) done while he was taking his half his usual coreg and while he still had cough showed - poor exercise capacity (52%) with lowered anaerobic threshld. He was wheezing bilaterally at end of test and had significant drop in his Fev1 ; confirming exercise induced asthma. In additon, he had circulatory limitation in terms of a heart rate gap and flat stroke volume. There was NO ischemic response  #Hx of Failed PFT all life   #asthma   -diagnosed spring 2015 in midst of fatigue, cough, dyspnea following viral infection end 2014 [place] trop   -on basis of abnormal PFT, CPST showing EIB and FeNO > 25 at [place] - spring 2015  #Micropulm nodules - seen CT 07/13/13   OV 01/27/2014  [name] returns for followup of chronic hoarseness, chronic cough, and asthma.  LAst visit advised advair, and aggressive gerd precaution (no fish oil, ppi and lean diet) and strick compliance with same . It appers he took his advair for a while. THis helped cough and then cough resolved. AFter this  he has been only 25% compliant with advair taking it on days when he i s abit more symptomatic with cough and hoarseness. Marland Kitchen His fatigue and dyspnea though seems independent of the cough and hoarseness and was related to his urosepsis in April 2015 and this has resolved as he reovered from it. He has been exercising in gym and feeling well. He is still bothered bu the hoarse voice and is random though H2 blockade or PPI helps. However, he feels he does not need these meds on daily basis.  Spirometry today shows significant decline in lung function cmpared to [facility] 2015. Fev1 0;96L/31%, ratio 69 (baseline march 2015 pre-bd was 1.3L/44, ratio 75 with 15% Post BD response to an fev1 1.47L/51%).  Exhaled NO - 26 and c/w asthma  He is reluctant to take MDIs. Says he wants data and feels it should be data that he charts at home in a tabular column based However, after hearing of lung function decline he is more acceptable to take his mdi on a regular basis.    Past, Family, Social reviewed: he is once again planning [place] trip 02/28/14    OV 02/22/2014  Chief Complaint  Patient presents with  . Follow-up    Pt states his SOB has improved since last OV. Pt c/o hoarseness. Pt denies cough and CP/tightness.    Feels better; improved  ET with stairs. But still doing advair 1 puff once daily only. Due to go to [place] in few days. Will have flu shot with pcp. Spirometyr today shows: VOice up and down hoarse like before without change. Good with gargling. COuld not do spirometry today todue to malfunction in wifi networkds    AcuteOV 05/31/2014   [name] presents for follow-up. I met his daughter socially a few days ago and there was concern that when he picks up a viral infection that he has a hard time bouncing back. In addition daughter was concerned about persistent hoarseness of the voice. Last saw patient in November 2015 prior to [place] trip. At that visit I insisted on continued Advair  compliance. He said he did the same and did not have any respiratory problems during the trip to [place].  Now for the lastfew weeks he picked upper respiratory infection with a lot of mucus drainage. A week and with illness he was seen at his primary care physician's office and was given Z-Pak. He is not so sure the Z-Pak helped him or he improved spontaneously. In balance he thinks that some of the mucus drainage might have improved with a Z-Pak. Currently he says that he is back to his baseline health. His asthma control questionnaire 5. Scale average is 0.6 showing good control of asthma. He says he is compliant with his Advair and does not miss any dose. His chronic hoarseness continues with waxing and waning quality. We again went over this. It is clear from his description that at [facility] that they have described and diagnosed him with paradoxical vocal cord dysfunction. He does not feel that that is another need for repeat ENT evaluation.  The main concerning thing today is that his lung function continues to be severely restrictive/obstructive. It is improved compared to fall 2015 since he went on Advair but only a little bit. It still reduced compared to a year ago in March 2015. However he states that he does not have any limitations with his daily exercise routine.     OV 08/08/2014  Chief Complaint  Patient presents with  . Follow-up    Pt stated his breathing has imporved since last OV. Pt stated does not need his rescue inhaler as much as he use to. Pt denies cough, SOB, and CP/tightness.      Follow-up moderate persistent asthma  Since last visit and for every 2016 I switched him to [facility] and with this he's had significant relief. He says that this is the best inhaler ever even though this is more expensive. He does not like Advair. He says that he is able to hold his breath many more seconds then when he was on other inhalers. He likes to once a day concept. He does not want to  switch to other inhalers. He prefers managing with some samples and occasionally painful 1. Currently feels his asthma is well controlled. Asthma control questionnaire is 0.2 symptoms for the last 1 weeks adjusting very good control. 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 so have  actually given on the asthma control questionnaire to take home and to test it on himself once a month  ASthma Contril panel March 2015  Oct 2015 05/31/2014  2//22/16 08/08/2014   Rx plan at visit entry    With prednisone empiric triak BREO high dose since feb  Fev1 1.3L/44,% -> 1.47L 0.96/39% 1.1/37% 1.26L/43% x  FVC   1.6L/40% 1.75L/44% x  Ratio 75 69 70 72 x  fef 25-75%     x  TLC     x  DLCO     x  ACQ-5 (1 week)   0.6  0.3  ACT (4 weeks)     x  FeNO  25 at Dr Remus Blake office   21 ppb 08/08/2014   Rx rec at end of visit  Rec to restart advair  Start BREO Taper to low dose breo     Review of Systems  Constitutional: Negative for fever and unexpected weight change.  HENT: Negative for congestion, dental problem, ear pain, nosebleeds, postnasal drip, rhinorrhea, sinus pressure, sneezing, sore throat and trouble swallowing.   Eyes: Negative for redness and itching.  Respiratory: Negative for cough, chest tightness, shortness of breath and wheezing.   Cardiovascular: Negative for palpitations and leg swelling.  Gastrointestinal: Negative for nausea and vomiting.  Genitourinary: Negative for dysuria.  Musculoskeletal: Negative for joint swelling.  Skin: Negative for rash.  Neurological: Negative for headaches.  Hematological: Does  not bruise/bleed easily.  Psychiatric/Behavioral: Negative for dysphoric mood. The patient is not nervous/anxious.        Objective:   Physical Exam   Filed Vitals:   08/08/14 1552  BP: 108/60  Pulse: 66  Height: 5' 7" (1.702 m)  Weight: 173 lb (78.472 kg)  SpO2: 98%        Assessment & Plan:     ICD-9-CM ICD-10-CM   1. Asthma, moderate persistent, well-controlled 493.90 J45.40     Asthma appears well controlled Drop down to lower dose-BREO 1 puff daily  Use albuterol as needed check ACQ score once a month - call if there is change  Followup Call in 1 month with ACQ score - if good we can cut Breo to 1 puff every other day per your direction ROV 3 months   - spirometry and FeNO and ACQ at followup

## 2014-08-08 NOTE — Patient Instructions (Signed)
ICD-9-CM ICD-10-CM   1. Asthma, moderate persistent, well-controlled 493.90 J45.40    Asthma appears well controlled Drop down to lower dose-BREO 1 puff daily  Use albuterol as needed check ACQ score once a month - call if there is change  Followup Call in 1 month with ACQ score - if good we can cut Breo to 1 puff every other day per your direction ROV 3 months   - spirometry and FeNO and ACQ at followup

## 2014-08-23 ENCOUNTER — Telehealth: Payer: Self-pay | Admitting: Internal Medicine

## 2014-08-23 NOTE — Telephone Encounter (Signed)
[name] is out of low dose breo. Please give him samples

## 2014-08-25 MED ORDER — FLUTICASONE FUROATE-VILANTEROL 100-25 MCG/INH IN AEPB: 1.0000 | INHALATION_SPRAY | Freq: Every day | RESPIRATORY_TRACT | Status: DC

## 2014-08-25 NOTE — Telephone Encounter (Signed)
Called and spoke to pt. Informed her of the samples placed up front. Pt verbalized understanding and denied any further questions or concerns at this time.

## 2014-08-25 NOTE — Telephone Encounter (Signed)
MR - Breo 200 is what is listed on pt's med list.  Which strength do you want him to have?

## 2014-08-25 NOTE — Telephone Encounter (Signed)
We dropped him down to to low dose breo 1 puff daily at visit 08/08/14

## 2014-08-31 DIAGNOSIS — S92324D Nondisplaced fracture of second metatarsal bone, right foot, subsequent encounter for fracture with routine healing: Secondary | ICD-10-CM | POA: Diagnosis not present

## 2014-09-26 DIAGNOSIS — Z0000 Encounter for general adult medical examination without abnormal findings: Secondary | ICD-10-CM | POA: Diagnosis not present

## 2014-09-26 DIAGNOSIS — E559 Vitamin D deficiency, unspecified: Secondary | ICD-10-CM | POA: Diagnosis not present

## 2014-09-26 DIAGNOSIS — I2510 Atherosclerotic heart disease of native coronary artery without angina pectoris: Secondary | ICD-10-CM | POA: Diagnosis not present

## 2014-09-26 DIAGNOSIS — E039 Hypothyroidism, unspecified: Secondary | ICD-10-CM | POA: Diagnosis not present

## 2014-09-28 ENCOUNTER — Telehealth: Payer: Self-pay | Admitting: Internal Medicine

## 2014-09-28 NOTE — Telephone Encounter (Signed)
Pt requesting last 3 OV notes to be printed for him to pick up on 09/29/14. Placed records up front for pt to pick up. Nothing further needed at this time.

## 2014-09-29 DIAGNOSIS — R972 Elevated prostate specific antigen [PSA]: Secondary | ICD-10-CM | POA: Diagnosis not present

## 2014-09-29 DIAGNOSIS — L989 Disorder of the skin and subcutaneous tissue, unspecified: Secondary | ICD-10-CM | POA: Diagnosis not present

## 2014-09-29 DIAGNOSIS — E559 Vitamin D deficiency, unspecified: Secondary | ICD-10-CM | POA: Diagnosis not present

## 2014-09-29 DIAGNOSIS — E018 Other iodine-deficiency related thyroid disorders and allied conditions: Secondary | ICD-10-CM | POA: Diagnosis not present

## 2014-09-30 DIAGNOSIS — R972 Elevated prostate specific antigen [PSA]: Secondary | ICD-10-CM | POA: Diagnosis not present

## 2014-09-30 DIAGNOSIS — R3912 Poor urinary stream: Secondary | ICD-10-CM | POA: Diagnosis not present

## 2014-09-30 DIAGNOSIS — N403 Nodular prostate with lower urinary tract symptoms: Secondary | ICD-10-CM | POA: Diagnosis not present

## 2014-10-03 DIAGNOSIS — L309 Dermatitis, unspecified: Secondary | ICD-10-CM | POA: Diagnosis not present

## 2014-10-12 DIAGNOSIS — S92324D Nondisplaced fracture of second metatarsal bone, right foot, subsequent encounter for fracture with routine healing: Secondary | ICD-10-CM | POA: Diagnosis not present

## 2014-10-19 DIAGNOSIS — C61 Malignant neoplasm of prostate: Secondary | ICD-10-CM | POA: Diagnosis not present

## 2014-10-19 DIAGNOSIS — R972 Elevated prostate specific antigen [PSA]: Secondary | ICD-10-CM | POA: Diagnosis not present

## 2014-10-21 ENCOUNTER — Telehealth: Payer: Self-pay | Admitting: Internal Medicine

## 2014-10-21 NOTE — Telephone Encounter (Signed)
Breo 100 sample have been left up front. LMTCB to inform pt.

## 2014-10-21 NOTE — Telephone Encounter (Signed)
[name]  He ran out of breo,.please give him some samples. He  Will come and pick up  Thanks  Dr. Brand Males, M.D., [facility].C.P Pulmonary and Critical Care Medicine Staff Physician [place] [facility] Pager: [phone], If no answer or between  15:00h - 7:00h: call [phone]  10/21/2014 9:58 AM

## 2014-10-24 NOTE — Telephone Encounter (Signed)
Made pt aware samples left up front. He will come by to pick up. Nothing further needed.

## 2014-10-26 DIAGNOSIS — Z961 Presence of intraocular lens: Secondary | ICD-10-CM | POA: Diagnosis not present

## 2014-10-26 DIAGNOSIS — H26491 Other secondary cataract, right eye: Secondary | ICD-10-CM | POA: Diagnosis not present

## 2014-11-03 DIAGNOSIS — C61 Malignant neoplasm of prostate: Secondary | ICD-10-CM | POA: Diagnosis not present

## 2014-11-06 ENCOUNTER — Telehealth: Payer: Self-pay | Admitting: Internal Medicine

## 2014-11-06 NOTE — Telephone Encounter (Signed)
[name] contacted me. Currently scheduled 11/09/14 at 10.45am. Wants to know if he can do same day 11/09/14  after 2pm (I can if ok with you and you think fine. Currently 11 for pm clinic)  or on 25th Monday (I say no) or 26th tue after 2pm  (if he can at 13.45pm I am ok with it)  He is expecting call from you 11/07/14

## 2014-11-07 NOTE — Telephone Encounter (Signed)
ATC pt. Line busy. WCB 

## 2014-11-08 ENCOUNTER — Encounter: Payer: Self-pay | Admitting: Internal Medicine

## 2014-11-08 ENCOUNTER — Ambulatory Visit (INDEPENDENT_AMBULATORY_CARE_PROVIDER_SITE_OTHER): Payer: Medicare Other | Admitting: Internal Medicine

## 2014-11-08 VITALS — BP 112/64 | HR 64 | Ht 67.0 in | Wt 176.6 lb

## 2014-11-08 DIAGNOSIS — J4540 Moderate persistent asthma, uncomplicated: Secondary | ICD-10-CM | POA: Diagnosis not present

## 2014-11-08 NOTE — Patient Instructions (Signed)
ICD-9-CM ICD-10-CM   1. Asthma, moderate persistent, well-controlled 493.90 J45.40    Plan  = re-do  breo technique with nurse  - reduce to breo alternate day as a trial  Followup   flu shot in fall 2016  return nov 2016

## 2014-11-08 NOTE — Progress Notes (Signed)
Subjective:    Patient ID: [name], male    DOB: 1940-10-22, 74 y.o.   MRN: [id number]  HPI    #Mitral valve replacement 2011,    #S/p RCA stent for asymptomatic > 99%  RCA stenosis in 2011 prior to MVR.     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 in 2011),   #chronic multi-year waxing and waning voice hoarseness  - per hx 2014 workup at [facility] ENT - Dx with LPR and s/p voice rehab  - symptoms improve but not resolve with H2 blockade or PPI  #chronic cough end 2014 through spring 2015 post [place] trip  -due to  GERD, post viral reactive airway and asthma  -  advair started April 2015  #Fatigue / dyspnea end 2014 through summer 2015 - following viral infecion end 2014 [place] trip and then urosepsis admission at [facility] March/early April 2015  - No ILD CT chest 07/13/13   - CPST test 08/06/13 (below) done while he was taking his half his usual coreg and while he still had cough showed - poor exercise capacity (52%) with lowered anaerobic threshld. He was wheezing bilaterally at end of test and had significant drop in his Fev1 ; confirming exercise induced asthma. In additon, he had circulatory limitation in terms of a heart rate gap and flat stroke volume. There was NO ischemic response  #Hx of Failed PFT all life   #asthma   -diagnosed spring 2015 in midst of fatigue, cough, dyspnea following viral infection end 2014 [place] trop   -on basis of abnormal PFT, CPST showing EIB and FeNO > 25 at [place] - spring 2015  #Micropulm nodules - seen CT 07/13/13   OV 01/27/2014  [name] returns for followup of chronic hoarseness, chronic cough, and asthma.  LAst visit advised advair, and aggressive gerd precaution (no fish oil, ppi and lean diet) and strick compliance with same . It appers he took his advair for a while. THis helped cough and then cough resolved. AFter this he  has been only 25% compliant with advair taking it on days when he i s abit more symptomatic with cough and hoarseness. Marland Kitchen His fatigue and dyspnea though seems independent of the cough and hoarseness and was related to his urosepsis in April 2015 and this has resolved as he reovered from it. He has been exercising in gym and feeling well. He is still bothered bu the hoarse voice and is random though H2 blockade or PPI helps. However, he feels he does not need these meds on daily basis.  Spirometry today shows significant decline in lung function cmpared to [facility] 2015. Fev1 0;96L/31%, ratio 69 (baseline march 2015 pre-bd was 1.3L/44, ratio 75 with 15% Post BD response to an fev1 1.47L/51%).  Exhaled NO - 26 and c/w asthma  He is reluctant to take MDIs. Says he wants data and feels it should be data that he charts at home in a tabular column based However, after hearing of lung function decline he is more acceptable to take his mdi on a regular basis.    Past, Family, Social reviewed: he is once again planning [place] trip 02/28/14    OV 02/22/2014  Chief Complaint  Patient presents with  . Follow-up    Pt states his SOB has improved since last OV. Pt c/o hoarseness. Pt denies cough and CP/tightness.    Feels better; improved ET  with stairs. But still doing advair 1 puff once daily only. Due to go to [place] in few days. Will have flu shot with pcp. Spirometyr today shows: VOice up and down hoarse like before without change. Good with gargling. COuld not do spirometry today todue to malfunction in wifi networkds    AcuteOV 05/31/2014   [name] presents for follow-up. I met his daughter socially a few days ago and there was concern that when he picks up a viral infection that he has a hard time bouncing back. In addition daughter was concerned about persistent hoarseness of the voice. Last saw patient in November 2015 prior to [place] trip. At that visit I insisted on continued Advair  compliance. He said he did the same and did not have any respiratory problems during the trip to [place].  Now for the lastfew weeks he picked upper respiratory infection with a lot of mucus drainage. A week and with illness he was seen at his primary care physician's office and was given Z-Pak. He is not so sure the Z-Pak helped him or he improved spontaneously. In balance he thinks that some of the mucus drainage might have improved with a Z-Pak. Currently he says that he is back to his baseline health. His asthma control questionnaire 5. Scale average is 0.6 showing good control of asthma. He says he is compliant with his Advair and does not miss any dose. His chronic hoarseness continues with waxing and waning quality. We again went over this. It is clear from his description that at [facility] that they have described and diagnosed him with paradoxical vocal cord dysfunction. He does not feel that that is another need for repeat ENT evaluation.  The main concerning thing today is that his lung function continues to be severely restrictive/obstructive. It is improved compared to fall 2015 since he went on Advair but only a little bit. It still reduced compared to a year ago in March 2015. However he states that he does not have any limitations with his daily exercise routine.     OV 08/08/2014  Chief Complaint  Patient presents with  . Follow-up    Pt stated his breathing has imporved since last OV. Pt stated does not need his rescue inhaler as much as he use to. Pt denies cough, SOB, and CP/tightness.      Follow-up moderate persistent asthma  Since last visit and for every 2016 I switched him to [facility] and with this he's had significant relief. He says that this is the best inhaler ever even though this is more expensive. He does not like Advair. He says that he is able to hold his breath many more seconds then when he was on other inhalers. He likes to once a day concept. He does not want to  switch to other inhalers. He prefers managing with some samples and occasionally painful 1. Currently feels his asthma is well controlled. Asthma control questionnaire is 0.2 symptoms for the last 1 weeks adjusting very good control. 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 Follow-up    Pt states his breathing has improved since last OV. Pt denies cough and CP/tightness.     Follow-up moderate persistent asthma   He presents with his wife. He is now on low-dose by mouth once daily. He likes it much better than Advair. At times he is not certain that he is getting the right quantity of 30 but overall he is doing well. Asthma control questionnaire 5. scale is 0.2 showing good control. He still has a strap on his right foot but he is able to play golf but he is limited with his exercise and he is gained weight. He thinks part of the weight gain is dutasteride's and therefore wants to reduce this inhaler. He's planning a trip to [place] in December 2016.  Currently because of asthma he says that he does not wake up in the middle of the night. When he wakes up early in the morning there are no symptoms from asthma. He has some physical limitations but this is not because of asthma. He has very little shortness of breath. He is not having any wheeze. But he does use his albuterol 1-2 puffs as needed on most days.  Past hx: dx with early stage Prostate CA  at [facility]  n obs Rx  ASthma Contril panel March 2015  Oct 2015 05/31/2014  2//22/16 08/08/2014  11/08/2014   Rx plan at visit entry    With prednisone empiric triak BREO high dose since feb   Fev1 1.3L/44,% -> 1.47L 0.96/39% 1.1/37% 1.26L/43% x   FVC   1.6L/40% 1.75L/44% x   Ratio 75 69 70 72 x   fef 25-75%     x   TLC     x   DLCO     x   ACQ-5 (1 week)   0.6  0.3 0.2  ACT (4 weeks)     x   FeNO  25 at Dr Remus Blake office   21 ppb 08/08/2014  23  Rx rec at end of visit  Rec to restart advair  Start BREO Taper to low dose breo reduc eto alt day breo     Review of Systems  Constitutional: Negative for fever and unexpected weight change.  HENT: Negative for congestion, dental problem, ear pain, nosebleeds, postnasal drip, rhinorrhea, sinus pressure, sneezing, sore throat and trouble swallowing.   Eyes: Negative for redness and itching.  Respiratory: Positive for shortness of breath. Negative for cough, chest tightness and wheezing.   Cardiovascular: Negative for palpitations and leg swelling.  Gastrointestinal: Negative for nausea and vomiting.  Genitourinary: Negative for dysuria.  Musculoskeletal: Negative for joint swelling.  Skin: Negative for rash.  Neurological: Negative for headaches.  Hematological: Does not bruise/bleed easily.  Psychiatric/Behavioral: Negative for dysphoric mood. The patient is not nervous/anxious.        Objective:   Physical Exam  Constitutional: He is oriented to person, place, and time. He appears well-developed and well-nourished. No distress.  Has gained weight  HENT:  Head: Normocephalic and atraumatic.  Right Ear: External ear normal.  Left Ear: External ear normal.  Mouth/Throat: Oropharynx is clear and moist. No oropharyngeal exudate.  Eyes: Conjunctivae and EOM are normal. Pupils are equal, round, and reactive to light. Right eye exhibits no discharge. Left eye exhibits no discharge. No scleral icterus.  Neck: Normal range of motion. Neck  supple. No JVD present. No tracheal deviation present. No thyromegaly present.  Cardiovascular: Normal rate, regular rhythm and intact distal pulses.  Exam reveals no gallop and no friction rub.   No murmur heard. Pulmonary/Chest: Effort normal and breath sounds normal. No respiratory distress. He has no wheezes. He has no rales. He exhibits no tenderness.  Abdominal: Soft. Bowel sounds are normal. He exhibits no distension and no mass. There is no tenderness. There is no rebound and no guarding.  Musculoskeletal: Normal range of motion. He exhibits no edema or tenderness.  Rt foot in strap booit  Lymphadenopathy:    He has no cervical adenopathy.  Neurological: He is alert and oriented to person, place, and time. He has normal reflexes. No cranial nerve deficit. Coordination normal.  Skin: Skin is warm and dry. No rash noted. He is not diaphoretic. No erythema. No pallor.  Psychiatric: He has a normal mood and affect. His behavior is normal. Judgment and thought content normal.  Nursing note and vitals reviewed.   Filed Vitals:   11/08/14 1350  BP: 112/64  Pulse: 64  SpO2: 96%         Assessment & Plan:     ICD-9-CM ICD-10-CM   1. Asthma, moderate persistent, well-controlled 493.90 J45.40    Stable and well controllled. Wants to cut down on inhaler Rx. Wants to ensure mdi tech is correct  PLAN  Plan  = re-do  breo technique with nurse  - reduce to breo alternate day as a trial  Followup   flu shot in fall 2016  return nov 2016   Dr. Brand Males, M.D., [facility].C.P Pulmonary and Critical Care Medicine Staff Physician [place] [facility] Pager: [phone], If no answer or between  15:00h - 7:00h: call [phone]  11/08/2014 2:19 PM

## 2014-11-08 NOTE — Telephone Encounter (Signed)
Called and spoke to pt. Appt made with MR on 7.26.16 at 1345. Pt verbalized understanding and denied any further questions or concerns at this time.

## 2014-11-09 ENCOUNTER — Ambulatory Visit: Payer: Medicare Other | Admitting: Internal Medicine

## 2014-11-09 DIAGNOSIS — H26492 Other secondary cataract, left eye: Secondary | ICD-10-CM | POA: Diagnosis not present

## 2014-11-13 DIAGNOSIS — R399 Unspecified symptoms and signs involving the genitourinary system: Secondary | ICD-10-CM | POA: Insufficient documentation

## 2014-11-13 DIAGNOSIS — N529 Male erectile dysfunction, unspecified: Secondary | ICD-10-CM | POA: Insufficient documentation

## 2014-11-22 DIAGNOSIS — S92324D Nondisplaced fracture of second metatarsal bone, right foot, subsequent encounter for fracture with routine healing: Secondary | ICD-10-CM | POA: Diagnosis not present

## 2014-12-29 DIAGNOSIS — E559 Vitamin D deficiency, unspecified: Secondary | ICD-10-CM | POA: Diagnosis not present

## 2014-12-29 DIAGNOSIS — E038 Other specified hypothyroidism: Secondary | ICD-10-CM | POA: Diagnosis not present

## 2015-01-04 DIAGNOSIS — E559 Vitamin D deficiency, unspecified: Secondary | ICD-10-CM | POA: Diagnosis not present

## 2015-01-04 DIAGNOSIS — I2510 Atherosclerotic heart disease of native coronary artery without angina pectoris: Secondary | ICD-10-CM | POA: Diagnosis not present

## 2015-01-04 DIAGNOSIS — Z0000 Encounter for general adult medical examination without abnormal findings: Secondary | ICD-10-CM | POA: Diagnosis not present

## 2015-01-04 DIAGNOSIS — E039 Hypothyroidism, unspecified: Secondary | ICD-10-CM | POA: Diagnosis not present

## 2015-01-06 ENCOUNTER — Telehealth: Payer: Self-pay | Admitting: Internal Medicine

## 2015-01-06 DIAGNOSIS — Z9889 Other specified postprocedural states: Secondary | ICD-10-CM | POA: Diagnosis not present

## 2015-01-06 DIAGNOSIS — I2510 Atherosclerotic heart disease of native coronary artery without angina pectoris: Secondary | ICD-10-CM | POA: Diagnosis not present

## 2015-01-06 DIAGNOSIS — I351 Nonrheumatic aortic (valve) insufficiency: Secondary | ICD-10-CM | POA: Diagnosis not present

## 2015-01-06 DIAGNOSIS — E780 Pure hypercholesterolemia: Secondary | ICD-10-CM | POA: Diagnosis not present

## 2015-01-06 NOTE — Telephone Encounter (Signed)
[name] is having urologic surgery soon. Please get name of his urologist - I frogot so I can do a letter certifying -preop ris  Thanks  Dr. Brand Males, M.D., [facility].C.P Pulmonary and Critical Care Medicine Staff Physician [place] [facility] Pager: [phone], If no answer or between  15:00h - 7:00h: call [phone]  01/06/2015 5:25 PM

## 2015-01-09 NOTE — Telephone Encounter (Signed)
Called and spoke to pt. Pt see's Dr. Carlota Raspberry for Urology.   Will forward to MR as FYI.

## 2015-01-10 DIAGNOSIS — E785 Hyperlipidemia, unspecified: Secondary | ICD-10-CM | POA: Diagnosis not present

## 2015-01-10 DIAGNOSIS — Z955 Presence of coronary angioplasty implant and graft: Secondary | ICD-10-CM | POA: Diagnosis not present

## 2015-01-10 DIAGNOSIS — Z7982 Long term (current) use of aspirin: Secondary | ICD-10-CM | POA: Diagnosis not present

## 2015-01-10 DIAGNOSIS — I10 Essential (primary) hypertension: Secondary | ICD-10-CM | POA: Diagnosis not present

## 2015-01-10 DIAGNOSIS — N400 Enlarged prostate without lower urinary tract symptoms: Secondary | ICD-10-CM | POA: Diagnosis not present

## 2015-01-10 DIAGNOSIS — C61 Malignant neoplasm of prostate: Secondary | ICD-10-CM | POA: Diagnosis not present

## 2015-01-10 DIAGNOSIS — K4020 Bilateral inguinal hernia, without obstruction or gangrene, not specified as recurrent: Secondary | ICD-10-CM | POA: Diagnosis not present

## 2015-01-10 DIAGNOSIS — J4530 Mild persistent asthma, uncomplicated: Secondary | ICD-10-CM | POA: Insufficient documentation

## 2015-01-10 DIAGNOSIS — I2510 Atherosclerotic heart disease of native coronary artery without angina pectoris: Secondary | ICD-10-CM | POA: Diagnosis not present

## 2015-01-10 DIAGNOSIS — Z87440 Personal history of urinary (tract) infections: Secondary | ICD-10-CM | POA: Diagnosis not present

## 2015-01-10 DIAGNOSIS — K219 Gastro-esophageal reflux disease without esophagitis: Secondary | ICD-10-CM | POA: Diagnosis not present

## 2015-01-10 DIAGNOSIS — D62 Acute posthemorrhagic anemia: Secondary | ICD-10-CM | POA: Diagnosis not present

## 2015-01-10 DIAGNOSIS — I898 Other specified noninfective disorders of lymphatic vessels and lymph nodes: Secondary | ICD-10-CM | POA: Diagnosis not present

## 2015-01-10 DIAGNOSIS — Z79899 Other long term (current) drug therapy: Secondary | ICD-10-CM | POA: Diagnosis not present

## 2015-01-10 DIAGNOSIS — Z7951 Long term (current) use of inhaled steroids: Secondary | ICD-10-CM | POA: Diagnosis not present

## 2015-01-10 NOTE — Telephone Encounter (Signed)
Daneil Dan  Send a  Letter as below. I think patient ? Pick up  Thanks  Dr. Brand Males, M.D., [facility].C.P Pulmonary and Critical Care Medicine Staff Physician [place] [facility] Pager: [phone], If no answer or between  15:00h - 7:00h: call [phone]  01/10/2015 5:38 PM      Dear Dr Carlota Raspberry  This is in reference to [name] Feb 01, 1941 our mutual patient. This letter is to certify preop pulmonary evaluation. [name]  Has the following problems that a cardiopulmonary   #Mitral valve replacement 2011,    #S/p RCA stent for asymptomatic > 99% RCA stenosis in 2011 prior to MVR.   #Ongoing mild aortic regurgitation - documented 2015; on observation  # chronic idiopathic right diaphragm paralysis (prior to MVR in 2011),   #chronic multi-year waxing and waning voice hoarseness - per hx 2014 workup at [facility] - Dx with LPR and s/p voice rehab - symptoms improve but not resolve with H2 blockade or PPI  #chronic cough end 2014 through spring 2015 post [place] trip -due to GERD, post viral reactive airway and asthma - advair started April 2015  #Fatigue / dyspnea end 2014 through summer 2015 - following viral infecion end 2014 [place] trip and then urosepsis admission at [facility] March/early April 2015 - No ILD CT chest 07/13/13  - CPST test 08/06/13 (below) done while he was taking his half his usual coreg and while he still had cough showed - poor exercise capacity (52%) with lowered anaerobic threshld. He was wheezing bilaterally at end of test and had significant drop in his Fev1 ; confirming exercise induced asthma. In additon, he had circulatory limitation in terms of a heart rate gap and flat stroke volume. There was NO ischemic response  #Hx of Failed PFT all life   #asthma  -diagnosed spring 2015 in midst of fatigue, cough, dyspnea following viral  infection end 2014 [place] trop  -on basis of abnormal PFT, CPST showing EIB and FeNO > 25 at [place] - spring 2015  #Micropulm nodules - seen CT 07/13/13   Based on this he will not to [place] that he has diminished lung function at baseline. However he is active and otherwise functional with good nutritional status and renal function. Therefore urologic procedure should only be of low-moderate risk for pulmonary complications postoperatively  I would recommend standard anesthesia and postoperative care but extra caution during the extubation face. If needed admitted to the ICU for extubation. He has tolerated cardiac surgery with these issues quite well. Therefore I do not anticipate problems after urologic surgery. DVT prophylaxis, nebulizer therapy, early mobilization will all be integral to his postoperative pulmonary recovery including aggressive pulmonary toilet using incentive spirometer  Please do not hesitate to contact me if you have any questions   Dr. Brand Males, M.D., [facility].C.P Pulmonary and Critical Care Medicine Staff Physician [place] [facility] Pager: [phone], If no answer or between  15:00h - 7:00h: call [phone]  01/10/2015 5:38 PM

## 2015-01-11 ENCOUNTER — Encounter: Payer: Self-pay | Admitting: *Deleted

## 2015-01-11 NOTE — Telephone Encounter (Signed)
Letter done and signed by MR.  Called pt and he wants this faxed. Also pt reports the letter needs to be addressed to Dr. Leveda Anna since he is the surgeon. Letter redone and signed again. Letter faxed to Dr. Glennis Brink office. Nothing further needed

## 2015-01-19 DIAGNOSIS — C61 Malignant neoplasm of prostate: Secondary | ICD-10-CM | POA: Diagnosis not present

## 2015-02-22 DIAGNOSIS — L608 Other nail disorders: Secondary | ICD-10-CM | POA: Diagnosis not present

## 2015-02-23 ENCOUNTER — Telehealth: Payer: Self-pay | Admitting: Internal Medicine

## 2015-02-23 ENCOUNTER — Other Ambulatory Visit: Payer: Self-pay | Admitting: Orthopedic Surgery

## 2015-02-23 NOTE — Telephone Encounter (Signed)
[name]  His daughter texted me. HE wants to see me in Nov 2016. Can you work hium in please  THanks  [place]

## 2015-02-24 NOTE — Telephone Encounter (Signed)
No available appts with MR in November.  No double booked days in clinic.  There is one held spot for research pt's at 10:45 on 03/13/15.  Ok to schedule there?

## 2015-02-27 NOTE — Telephone Encounter (Signed)
Called and spoke to pt. Appt made for 02/28/15. Pt verbalized understanding and denied any further questions or concerns at this time.

## 2015-02-28 ENCOUNTER — Ambulatory Visit (INDEPENDENT_AMBULATORY_CARE_PROVIDER_SITE_OTHER): Payer: Medicare Other | Admitting: Internal Medicine

## 2015-02-28 ENCOUNTER — Encounter: Payer: Self-pay | Admitting: Internal Medicine

## 2015-02-28 VITALS — BP 124/62 | HR 63 | Ht 67.0 in | Wt 171.0 lb

## 2015-02-28 DIAGNOSIS — J4540 Moderate persistent asthma, uncomplicated: Secondary | ICD-10-CM | POA: Diagnosis not present

## 2015-02-28 NOTE — Patient Instructions (Addendum)
ICD-9-CM ICD-10-CM   1. Asthma, moderate persistent, well-controlled 493.90 J45.40    Stable disease Gald you are better after prostate surgery Glad you are going to start exercising and aiming to lose weight  Plan  - continue BREO MWF schedule with alternating sundays  Followupo  4 months or sooner if needed  - ACQ and feno at followup

## 2015-02-28 NOTE — Progress Notes (Signed)
Subjective:     Patient ID: [name], male   DOB: 1940/05/02, 74 y.o.   MRN: [id number]  HPI    #Mitral valve replacement 2011,    #S/p RCA stent for asymptomatic > 99%  RCA stenosis in 2011 prior to MVR.     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 in 2011),   #chronic multi-year waxing and waning voice hoarseness  - per hx 2014 workup at [facility] - Dx with LPR and s/p voice rehab  - symptoms improve but not resolve with H2 blockade or PPI  #chronic cough end 2014 through spring 2015 post [place] trip  -due to  GERD, post viral reactive airway and asthma  -  advair started April 2015  #Fatigue / dyspnea end 2014 through summer 2015 - following viral infecion end 2014 [place] trip and then urosepsis admission at [facility] March/early April 2015  - No ILD CT chest 07/13/13   - CPST test 08/06/13 (below) done while he was taking his half his usual coreg and while he still had cough showed - poor exercise capacity (52%) with lowered anaerobic threshld. He was wheezing bilaterally at end of test and had significant drop in his Fev1 ; confirming exercise induced asthma. In additon, he had circulatory limitation in terms of a heart rate gap and flat stroke volume. There was NO ischemic response  #Hx of Failed PFT all life   #asthma   -diagnosed spring 2015 in midst of fatigue, cough, dyspnea following viral infection end 2014 [place] trop   -on basis of abnormal PFT, CPST showing EIB and FeNO > 25 at [place] - spring 2015  #Micropulm nodules - seen CT 07/13/13   OV 01/27/2014  [name] returns for followup of chronic hoarseness, chronic cough, and asthma.  LAst visit advised advair, and aggressive gerd precaution (no fish oil, ppi and lean diet) and strick compliance with same . It appers he took his advair for a while. THis helped cough and then cough resolved. AFter this he has  been only 25% compliant with advair taking it on days when he i s abit more symptomatic with cough and hoarseness. Marland Kitchen His fatigue and dyspnea though seems independent of the cough and hoarseness and was related to his urosepsis in April 2015 and this has resolved as he reovered from it. He has been exercising in gym and feeling well. He is still bothered bu the hoarse voice and is random though H2 blockade or PPI helps. However, he feels he does not need these meds on daily basis.  Spirometry today shows significant decline in lung function cmpared to [facility] 2015. Fev1 0;96L/31%, ratio 69 (baseline march 2015 pre-bd was 1.3L/44, ratio 75 with 15% Post BD response to an fev1 1.47L/51%).  Exhaled NO - 26 and c/w asthma  He is reluctant to take MDIs. Says he wants data and feels it should be data that he charts at home in a tabular column based However, after hearing of lung function decline he is more acceptable to take his mdi on a regular basis.    Past, Family, Social reviewed: he is once again planning [place] trip 02/28/14    OV 02/22/2014  Chief Complaint  Patient presents with  . Follow-up    Pt states his SOB has improved since last OV. Pt c/o hoarseness. Pt denies cough and CP/tightness.    Feels better; improved ET with  stairs. But still doing advair 1 puff once daily only. Due to go to [place] in few days. Will have flu shot with pcp. Spirometyr today shows: VOice up and down hoarse like before without change. Good with gargling. COuld not do spirometry today todue to malfunction in wifi networkds    AcuteOV 05/31/2014   [name] presents for follow-up. I met his daughter socially a few days ago and there was concern that when he picks up a viral infection that he has a hard time bouncing back. In addition daughter was concerned about persistent hoarseness of the voice. Last saw patient in November 2015 prior to [place] trip. At that visit I insisted on continued Advair compliance.  He said he did the same and did not have any respiratory problems during the trip to [place].  Now for the lastfew weeks he picked upper respiratory infection with a lot of mucus drainage. A week and with illness he was seen at his primary care physician's office and was given Z-Pak. He is not so sure the Z-Pak helped him or he improved spontaneously. In balance he thinks that some of the mucus drainage might have improved with a Z-Pak. Currently he says that he is back to his baseline health. His asthma control questionnaire 5. Scale average is 0.6 showing good control of asthma. He says he is compliant with his Advair and does not miss any dose. His chronic hoarseness continues with waxing and waning quality. We again went over this. It is clear from his description that at [facility] that they have described and diagnosed him with paradoxical vocal cord dysfunction. He does not feel that that is another need for repeat ENT evaluation.  The main concerning thing today is that his lung function continues to be severely restrictive/obstructive. It is improved compared to fall 2015 since he went on Advair but only a little bit. It still reduced compared to a year ago in March 2015. However he states that he does not have any limitations with his daily exercise routine.     OV 08/08/2014  Chief Complaint  Patient presents with  . Follow-up    Pt stated his breathing has imporved since last OV. Pt stated does not need his rescue inhaler as much as he use to. Pt denies cough, SOB, and CP/tightness.      Follow-up moderate persistent asthma  Since last visit and for every 2016 I switched him to [facility] and with this he's had significant relief. He says that this is the best inhaler ever even though this is more expensive. He does not like Advair. He says that he is able to hold his breath many more seconds then when he was on other inhalers. He likes to once a day concept. He does not want to switch to other  inhalers. He prefers managing with some samples and occasionally painful 1. Currently feels his asthma is well controlled. Asthma control questionnaire is 0.2 symptoms for the last 1 weeks adjusting very good control. 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 Follow-up    Pt states his breathing has improved since last OV. Pt denies cough and CP/tightness.     Follow-up moderate persistent asthma   He presents with his wife. He is now on low-dose BREOP mouth once daily. He likes it much better than Advair. At times he is not certain that he is getting the right quantity of 30 but overall he is doing well. Asthma control questionnaire 5. scale is 0.2 showing good control. He still has a strap on his right foot but he is able to play golf but he is limited with his exercise and he is gained weight. He thinks part of the weight gain is dutasteride's and therefore wants to reduce this inhaler. He's planning a trip to [place] in December 2016.  Currently because of asthma he says that he does not wake up in the middle of the night. When he wakes up early in the morning there are no symptoms from asthma. He has some physical limitations but this is not because of asthma. He has very little shortness of breath. He is not having any wheeze. But he does use his albuterol 1-2 puffs as needed on most days.  Past hx: dx with early stage Prostate CA at [facility]  n  obs Rx   OV 02/28/2015  Chief Complaint  Patient presents with  . Follow-up    Pt states he has a recent cold. Pt states his cough was productive but now is non prod. Pt c/o sinus congestion. Pt states his SOB at baseline. Pt denies CP/tightness and f/c/s.    follow-up moderate persistent asthma follow-up moderate persistent asthma   He is now taking Brio 3 times a week on 1 week and then 4 times a week on another week. He says his asthma is well controlled. The last 2 days he's had a cold with some yellow mucus just once a day. He is not any worse in terms of his asthma. He does not feel like he needs antibiotic. He does not feel he needs steroids. Overall he feels his asthma control is excellent. He does not have any nocturnal symptoms or daytime symptoms. He is worried about the long-term side effects of steroids and therefore he wants to continue with his 3 times a week regimen of the Brio  Past medical history  0 he is now status post prostate cancer surgery at [facility] approximately 5-6 weeks ago. He denies any postoperative pulmonary complications. However at the time of surgery when I spoke to his daughter she mentioned something about hypercapnia that was transient and brief either  perioperative or postop. Patient is unaware of this  - He has gained weight because of inactivity following his right lower extremity ankle injury and then the prostate cancer surgery. No history of any DVT. He plans to resume exercise soon  - He is up-to-date with his flu shot  ASthma Contril panel March 2015  Oct 2015 05/31/2014  2//22/16 08/08/2014  11/08/2014  02/28/2015   Rx plan at visit entry    With prednisone empiric triak BREO high dose since feb   Brio 3 times a week   Fev1 1.3L/44,% -> 1.47L 0.96/39% 1.1/37% 1.26L/43% x  x  FVC   1.6L/40% 1.75L/44% x  x  Ratio 75 69 70 72 x  x  fef 25-75%     x  x  TLC     x  x  DLCO     x  xx  ACQ-5 (1 week)   0.6  0.3 0.2 x  ACT (4  weeks)     x  x  FeNO  25 at Dr Remus Blake office   21 ppb 08/08/2014  23 x  Rx rec at end of visit  Rec to restart advair  Start BREO Taper to low dose breo reduc eto alt day breo  xBrio 3 times a week      Allergies  Allergen Reactions  . Penicillins Itching and Rash     Current outpatient prescriptions:  .  aspirin 81 MG tablet, Take 81 mg by mouth daily.  , Disp: , Rfl:  .  calcium-vitamin D (OSCAL WITH D) 500-200 MG-UNIT per tablet, Take 2 tablets by mouth daily.  , Disp: , Rfl:  .  cetirizine (ZYRTEC) 10 MG tablet, Take 10 mg by mouth daily.  , Disp: , Rfl:  .  Cholecalciferol (VITAMIN D3) 1000 UNITS CAPS, Take 1 capsule by mouth daily., Disp: , Rfl:  .  Coenzyme Q10 (CO Q 10) 100 MG CAPS, Take 1 capsule by mouth daily., Disp: , Rfl:  .  famotidine (PEPCID) 10 MG tablet, Take 10 mg by mouth daily. , Disp: , Rfl:  .  Fluticasone Furoate-Vilanterol (BREO ELLIPTA) 100-25 MCG/INH AEPB, Inhale 1 puff into the lungs daily. (Patient taking differently: Inhale 1 puff into the lungs every other day. ), Disp: 60 each, Rfl: 0 .  losartan (COZAAR) 50 MG tablet, Take 50 mg by mouth daily. , Disp: , Rfl:  .  Magnesium 400 MG CAPS, Take 1 capsule by mouth daily., Disp: , Rfl:  .  nebivolol (BYSTOLIC) 10 MG tablet, Take 10 mg by mouth daily., Disp: , Rfl:  .  rosuvastatin (CRESTOR) 10 MG tablet, Take 10 mg by mouth daily., Disp: , Rfl:  .  albuterol (PROVENTIL HFA;VENTOLIN HFA) 108 (90 BASE) MCG/ACT inhaler, Inhale 2 puffs into the lungs every 6 (six) hours as needed for wheezing or shortness of breath. (Patient not taking: Reported on 11/08/2014), Disp: 1 Inhaler, Rfl: 6 .  montelukast (SINGULAIR) 10 MG tablet, Take 1 tablet (10 mg total) by mouth at bedtime. (Patient not taking: Reported on 08/08/2014), Disp: 30 tablet, Rfl: 11 .  Probiotic Product (PROBIOTIC PO), Take 1 tablet by mouth daily., Disp: , Rfl:  .  tamsulosin (FLOMAX) 0.4 MG CAPS capsule, Take 1 capsule (0.4 mg total) by mouth daily.  (Patient not taking: Reported on 02/28/2015), Disp: 30 capsule, Rfl: 0  Immunization History  Administered Date(s) Administered  . Influenza Split 07/08/2013  . Influenza,inj,Quad PF,36+ Mos 02/27/2015  . Pneumococcal Polysaccharide-23 04/16/2011     Review of Systems According to history of present illness    Objective:   Physical Exam  Constitutional: He is oriented to person, place, and time. He appears well-developed and well-nourished. No distress.   Looks better  HENT:  Head: Normocephalic and atraumatic.  Right Ear: External ear normal.  Left Ear: External ear normal.  Mouth/Throat: Oropharynx is clear and moist. No oropharyngeal exudate.  Eyes: Conjunctivae and EOM are normal. Pupils are equal, round, and reactive to light. Right eye exhibits no discharge. Left eye exhibits no discharge. No scleral icterus.  Neck: Normal range of motion. Neck supple. No JVD present. No tracheal deviation present. No thyromegaly present.  Cardiovascular: Normal rate, regular rhythm and  intact distal pulses.  Exam reveals no gallop and no friction rub.   No murmur heard. Pulmonary/Chest: Effort normal and breath sounds normal. No respiratory distress. He has no wheezes. He has no rales. He exhibits no tenderness.  Abdominal: Soft. Bowel sounds are normal. He exhibits no distension and no mass. There is no tenderness. There is no rebound and no guarding.  Musculoskeletal: Normal range of motion. He exhibits no edema or tenderness.  Lymphadenopathy:    He has no cervical adenopathy.  Neurological: He is alert and oriented to person, place, and time. He has normal reflexes. No cranial nerve deficit. Coordination normal.  Skin: Skin is warm and dry. No rash noted. He is not diaphoretic. No erythema. No pallor.  Psychiatric: He has a normal mood and affect. His behavior is normal. Judgment and thought content normal.  Nursing note and vitals reviewed.  Filed Vitals:   02/28/15 1400  BP: 124/62   Pulse: 63  Height: _0  (1.702 m)  Weight: 171 lb (77.565 kg)  SpO2: 98%       Assessment:       ICD-9-CM ICD-10-CM   1. Asthma, moderate persistent, well-controlled 493.90 J45.40        Stable disease Gald you are better after prostate surgery Glad you are going to start exercising and aiming to lose weight  Plan  - continue BREO MWF schedule with alternating sundays  Followupo  4 months or sooner if needed   Dr. Brand Males, M.D., [facility].C.P Pulmonary and Critical Care Medicine Staff Physician [place] [facility] Pager: [phone], If no answer or between  15:00h - 7:00h: call [phone]  02/28/2015 3:16 PM    Plan:      Stable disease Gald you are better after prostate surgery Glad you are going to start exercising and aiming to lose weight  Plan  - continue BREO MWF schedule with alternating sundays  Followupo  4 months or sooner if needed   Dr. Brand Males, M.D., [facility].C.P Pulmonary and Critical Care Medicine Staff Physician [place] [facility] Pager: [phone], If no answer or between  15:00h - 7:00h: call [phone]  02/28/2015 3:16 PM

## 2015-03-14 ENCOUNTER — Encounter (HOSPITAL_BASED_OUTPATIENT_CLINIC_OR_DEPARTMENT_OTHER): Admission: RE | Payer: Self-pay | Source: Ambulatory Visit

## 2015-03-14 ENCOUNTER — Ambulatory Visit (HOSPITAL_BASED_OUTPATIENT_CLINIC_OR_DEPARTMENT_OTHER): Admission: RE | Admit: 2015-03-14 | Payer: Medicare Other | Source: Ambulatory Visit | Admitting: Orthopedic Surgery

## 2015-03-14 SURGERY — BIOPSY, BONE
Anesthesia: General | Site: Thumb | Laterality: Right

## 2015-03-21 DIAGNOSIS — I351 Nonrheumatic aortic (valve) insufficiency: Secondary | ICD-10-CM | POA: Diagnosis not present

## 2015-03-23 DIAGNOSIS — Z955 Presence of coronary angioplasty implant and graft: Secondary | ICD-10-CM | POA: Diagnosis not present

## 2015-03-23 DIAGNOSIS — Z7982 Long term (current) use of aspirin: Secondary | ICD-10-CM | POA: Diagnosis not present

## 2015-03-23 DIAGNOSIS — J45909 Unspecified asthma, uncomplicated: Secondary | ICD-10-CM | POA: Diagnosis not present

## 2015-03-23 DIAGNOSIS — C61 Malignant neoplasm of prostate: Secondary | ICD-10-CM | POA: Diagnosis not present

## 2015-03-23 DIAGNOSIS — I10 Essential (primary) hypertension: Secondary | ICD-10-CM | POA: Diagnosis not present

## 2015-03-23 DIAGNOSIS — I2510 Atherosclerotic heart disease of native coronary artery without angina pectoris: Secondary | ICD-10-CM | POA: Diagnosis not present

## 2015-03-23 DIAGNOSIS — R972 Elevated prostate specific antigen [PSA]: Secondary | ICD-10-CM | POA: Diagnosis not present

## 2015-03-23 DIAGNOSIS — Z48816 Encounter for surgical aftercare following surgery on the genitourinary system: Secondary | ICD-10-CM | POA: Diagnosis not present

## 2015-03-23 DIAGNOSIS — Z8546 Personal history of malignant neoplasm of prostate: Secondary | ICD-10-CM | POA: Diagnosis not present

## 2015-03-23 DIAGNOSIS — Z79899 Other long term (current) drug therapy: Secondary | ICD-10-CM | POA: Diagnosis not present

## 2015-03-23 DIAGNOSIS — Z9079 Acquired absence of other genital organ(s): Secondary | ICD-10-CM | POA: Diagnosis not present

## 2015-03-29 ENCOUNTER — Other Ambulatory Visit: Payer: Self-pay | Admitting: Endocrinology

## 2015-03-29 DIAGNOSIS — D2371 Other benign neoplasm of skin of right lower limb, including hip: Secondary | ICD-10-CM | POA: Diagnosis not present

## 2015-03-29 DIAGNOSIS — L989 Disorder of the skin and subcutaneous tissue, unspecified: Secondary | ICD-10-CM | POA: Diagnosis not present

## 2015-04-19 ENCOUNTER — Other Ambulatory Visit: Payer: Self-pay | Admitting: Orthopedic Surgery

## 2015-04-28 ENCOUNTER — Encounter (HOSPITAL_BASED_OUTPATIENT_CLINIC_OR_DEPARTMENT_OTHER): Payer: Self-pay | Admitting: *Deleted

## 2015-05-01 ENCOUNTER — Encounter (HOSPITAL_BASED_OUTPATIENT_CLINIC_OR_DEPARTMENT_OTHER)
Admission: RE | Admit: 2015-05-01 | Discharge: 2015-05-01 | Disposition: A | Discharge status: Home / Self Care | Payer: Medicare Other | Source: Ambulatory Visit | Attending: Orthopedic Surgery | Admitting: Orthopedic Surgery

## 2015-05-01 DIAGNOSIS — D0361 Melanoma in situ of right upper limb, including shoulder: Secondary | ICD-10-CM | POA: Diagnosis not present

## 2015-05-01 DIAGNOSIS — L814 Other melanin hyperpigmentation: Secondary | ICD-10-CM | POA: Diagnosis present

## 2015-05-01 DIAGNOSIS — Z7982 Long term (current) use of aspirin: Secondary | ICD-10-CM | POA: Diagnosis not present

## 2015-05-01 DIAGNOSIS — I2510 Atherosclerotic heart disease of native coronary artery without angina pectoris: Secondary | ICD-10-CM | POA: Diagnosis not present

## 2015-05-01 DIAGNOSIS — I10 Essential (primary) hypertension: Secondary | ICD-10-CM | POA: Diagnosis not present

## 2015-05-01 DIAGNOSIS — Z8546 Personal history of malignant neoplasm of prostate: Secondary | ICD-10-CM | POA: Diagnosis not present

## 2015-05-01 DIAGNOSIS — Z880 Allergy status to penicillin: Secondary | ICD-10-CM | POA: Diagnosis not present

## 2015-05-01 DIAGNOSIS — Z8673 Personal history of transient ischemic attack (TIA), and cerebral infarction without residual deficits: Secondary | ICD-10-CM | POA: Diagnosis not present

## 2015-05-01 LAB — BASIC METABOLIC PANEL
Anion gap: 8 (ref 5–15)
BUN: 21 mg/dL — ABNORMAL HIGH (ref 6–20)
CO2: 27 mmol/L (ref 22–32)
Calcium: 9.4 mg/dL (ref 8.9–10.3)
Chloride: 104 mmol/L (ref 101–111)
Creatinine, Ser: 1.31 mg/dL — ABNORMAL HIGH (ref 0.61–1.24)
GFR calc Af Amer: >60 mL/min (ref >60)
GFR calc non Af Amer: 52 mL/min — ABNORMAL LOW (ref >60)
Glucose, Bld: 84 mg/dL (ref 65–99)
Potassium: 4.5 mmol/L (ref 3.5–5.1)
Sodium: 139 mmol/L (ref 135–145)

## 2015-05-02 ENCOUNTER — Ambulatory Visit (HOSPITAL_BASED_OUTPATIENT_CLINIC_OR_DEPARTMENT_OTHER)
Admission: RE | Admit: 2015-05-02 | Discharge: 2015-05-02 | Disposition: A | Discharge status: Home / Self Care | Payer: Medicare Other | Source: Ambulatory Visit | Attending: Orthopedic Surgery | Admitting: Orthopedic Surgery

## 2015-05-02 ENCOUNTER — Encounter (HOSPITAL_BASED_OUTPATIENT_CLINIC_OR_DEPARTMENT_OTHER): Payer: Self-pay | Admitting: *Deleted

## 2015-05-02 ENCOUNTER — Ambulatory Visit (HOSPITAL_BASED_OUTPATIENT_CLINIC_OR_DEPARTMENT_OTHER): Payer: Medicare Other | Admitting: Anesthesiology

## 2015-05-02 ENCOUNTER — Encounter (HOSPITAL_BASED_OUTPATIENT_CLINIC_OR_DEPARTMENT_OTHER)
Admission: RE | Disposition: A | Discharge status: Home / Self Care | Payer: Self-pay | Source: Ambulatory Visit | Attending: Orthopedic Surgery

## 2015-05-02 DIAGNOSIS — I2510 Atherosclerotic heart disease of native coronary artery without angina pectoris: Secondary | ICD-10-CM | POA: Diagnosis not present

## 2015-05-02 DIAGNOSIS — D0361 Melanoma in situ of right upper limb, including shoulder: Secondary | ICD-10-CM | POA: Diagnosis not present

## 2015-05-02 DIAGNOSIS — I10 Essential (primary) hypertension: Secondary | ICD-10-CM | POA: Diagnosis not present

## 2015-05-02 DIAGNOSIS — Z7982 Long term (current) use of aspirin: Secondary | ICD-10-CM | POA: Diagnosis not present

## 2015-05-02 DIAGNOSIS — L608 Other nail disorders: Secondary | ICD-10-CM | POA: Diagnosis not present

## 2015-05-02 DIAGNOSIS — Z8673 Personal history of transient ischemic attack (TIA), and cerebral infarction without residual deficits: Secondary | ICD-10-CM | POA: Diagnosis not present

## 2015-05-02 DIAGNOSIS — Z880 Allergy status to penicillin: Secondary | ICD-10-CM | POA: Insufficient documentation

## 2015-05-02 DIAGNOSIS — C4361 Malignant melanoma of right upper limb, including shoulder: Secondary | ICD-10-CM | POA: Diagnosis not present

## 2015-05-02 DIAGNOSIS — Z8546 Personal history of malignant neoplasm of prostate: Secondary | ICD-10-CM | POA: Insufficient documentation

## 2015-05-02 HISTORY — DX: Adverse effect of unspecified anesthetic, initial encounter: T41.45XA

## 2015-05-02 HISTORY — DX: Other complications of anesthesia, initial encounter: T88.59XA

## 2015-05-02 HISTORY — PX: NAILBED REPAIR: SHX5028

## 2015-05-02 HISTORY — DX: Unspecified asthma, uncomplicated: J45.909

## 2015-05-02 HISTORY — DX: Malignant (primary) neoplasm, unspecified: C80.1

## 2015-05-02 SURGERY — REPAIR, NAIL BED
Anesthesia: Monitor Anesthesia Care | Site: Thumb | Laterality: Right

## 2015-05-02 MED ORDER — LIDOCAINE HCL (CARDIAC) 20 MG/ML IV SOLN
INTRAVENOUS | Status: AC
  Filled 2015-05-02: qty 5

## 2015-05-02 MED ORDER — VANCOMYCIN HCL IN DEXTROSE 1-5 GM/200ML-% IV SOLN: 1000.0000 mg | INTRAVENOUS | Status: DC

## 2015-05-02 MED ORDER — GLYCOPYRROLATE 0.2 MG/ML IJ SOLN: 0.2000 mg | Freq: Once | INTRAMUSCULAR | Status: DC | PRN

## 2015-05-02 MED ORDER — LACTATED RINGERS IV SOLN
INTRAVENOUS | Status: DC
  Administered 2015-05-02: 09:00:00 via INTRAVENOUS

## 2015-05-02 MED ORDER — HYDROCODONE-ACETAMINOPHEN 5-325 MG PO TABS: 1.0000 | ORAL_TABLET | Freq: Four times a day (QID) | ORAL | Status: DC | PRN

## 2015-05-02 MED ORDER — BUPIVACAINE HCL (PF) 0.25 % IJ SOLN
INTRAMUSCULAR | Status: DC | PRN
  Administered 2015-05-02: 8 mL

## 2015-05-02 MED ORDER — MIDAZOLAM HCL 2 MG/2ML IJ SOLN: 1.0000 mg | INTRAMUSCULAR | Status: DC | PRN

## 2015-05-02 MED ORDER — ONDANSETRON HCL 4 MG/2ML IJ SOLN
INTRAMUSCULAR | Status: DC | PRN
Start: 2015-05-02 — End: 2015-05-02
  Administered 2015-05-02: 4 mg via INTRAVENOUS

## 2015-05-02 MED ORDER — SCOPOLAMINE 1 MG/3DAYS TD PT72: 1.0000 | MEDICATED_PATCH | Freq: Once | TRANSDERMAL | Status: DC

## 2015-05-02 MED ORDER — VANCOMYCIN HCL IN DEXTROSE 1-5 GM/200ML-% IV SOLN
1000.0000 mg | INTRAVENOUS | Status: DC
  Administered 2015-05-02: 1000 mg via INTRAVENOUS

## 2015-05-02 MED ORDER — CHLORHEXIDINE GLUCONATE 4 % EX LIQD: 60.0000 mL | Freq: Once | CUTANEOUS | Status: DC

## 2015-05-02 MED ORDER — FENTANYL CITRATE (PF) 100 MCG/2ML IJ SOLN
INTRAMUSCULAR | Status: AC
  Filled 2015-05-02: qty 2

## 2015-05-02 MED ORDER — VANCOMYCIN HCL IN DEXTROSE 1-5 GM/200ML-% IV SOLN
INTRAVENOUS | Status: AC
  Filled 2015-05-02: qty 200

## 2015-05-02 MED ORDER — LIDOCAINE HCL (PF) 0.5 % IJ SOLN
INTRAMUSCULAR | Status: DC | PRN
  Administered 2015-05-02: 30 mL via INTRAVENOUS

## 2015-05-02 MED ORDER — FENTANYL CITRATE (PF) 100 MCG/2ML IJ SOLN: 25.0000 ug | INTRAMUSCULAR | Status: DC | PRN

## 2015-05-02 MED ORDER — ONDANSETRON HCL 4 MG/2ML IJ SOLN
INTRAMUSCULAR | Status: AC
  Filled 2015-05-02: qty 2

## 2015-05-02 MED ORDER — FENTANYL CITRATE (PF) 100 MCG/2ML IJ SOLN
50.0000 ug | INTRAMUSCULAR | Status: DC | PRN
  Administered 2015-05-02: 50 ug via INTRAVENOUS

## 2015-05-02 MED ORDER — LACTATED RINGERS IV SOLN: INTRAVENOUS | Status: DC

## 2015-05-02 MED ORDER — PROPOFOL 500 MG/50ML IV EMUL
INTRAVENOUS | Status: DC | PRN
  Administered 2015-05-02: 50 ug/kg/min via INTRAVENOUS

## 2015-05-02 MED ORDER — LIDOCAINE HCL (CARDIAC) 20 MG/ML IV SOLN
INTRAVENOUS | Status: DC | PRN
  Administered 2015-05-02: 20 mg via INTRAVENOUS

## 2015-05-02 SURGICAL SUPPLY — 40 items
BLADE MINI RND TIP GREEN BEAV (BLADE) ×2 IMPLANT
BLADE SURG 15 STRL LF DISP TIS (BLADE) ×1 IMPLANT
BLADE SURG 15 STRL SS (BLADE) ×1
BNDG COHESIVE 1X5 TAN STRL LF (GAUZE/BANDAGES/DRESSINGS) IMPLANT
BNDG ESMARK 4X9 LF (GAUZE/BANDAGES/DRESSINGS) IMPLANT
CHLORAPREP W/TINT 26ML (MISCELLANEOUS) ×2 IMPLANT
CORDS BIPOLAR (ELECTRODE) ×2 IMPLANT
COVER BACK TABLE 60X90IN (DRAPES) ×2 IMPLANT
COVER MAYO STAND STRL (DRAPES) ×2 IMPLANT
CUFF TOURNIQUET SINGLE 18IN (TOURNIQUET CUFF) ×2 IMPLANT
DRAPE EXTREMITY T 121X128X90 (DRAPE) ×2 IMPLANT
DRAPE SURG 17X23 STRL (DRAPES) ×2 IMPLANT
GAUZE SPONGE 4X4 12PLY STRL (GAUZE/BANDAGES/DRESSINGS) ×2 IMPLANT
GAUZE XEROFORM 1X8 LF (GAUZE/BANDAGES/DRESSINGS) ×2 IMPLANT
GLOVE BIOGEL PI IND STRL 7.0 (GLOVE) ×3 IMPLANT
GLOVE BIOGEL PI IND STRL 8.5 (GLOVE) ×1 IMPLANT
GLOVE BIOGEL PI INDICATOR 7.0 (GLOVE) ×3
GLOVE BIOGEL PI INDICATOR 8.5 (GLOVE) ×1
GLOVE ECLIPSE 6.5 STRL STRAW (GLOVE) ×2 IMPLANT
GLOVE SURG ORTHO 8.0 STRL STRW (GLOVE) ×2 IMPLANT
GLOVE SURG SS PI 7.0 STRL IVOR (GLOVE) ×2 IMPLANT
GOWN STRL REUS W/ TWL LRG LVL3 (GOWN DISPOSABLE) ×2 IMPLANT
GOWN STRL REUS W/TWL LRG LVL3 (GOWN DISPOSABLE) ×2
GOWN STRL REUS W/TWL XL LVL3 (GOWN DISPOSABLE) ×2 IMPLANT
NEEDLE PRECISIONGLIDE 27X1.5 (NEEDLE) ×2 IMPLANT
NS IRRIG 1000ML POUR BTL (IV SOLUTION) ×2 IMPLANT
PACK BASIN DAY SURGERY FS (CUSTOM PROCEDURE TRAY) ×2 IMPLANT
PADDING CAST ABS 4INX4YD NS (CAST SUPPLIES)
PADDING CAST ABS COTTON 4X4 ST (CAST SUPPLIES) IMPLANT
SPLINT FINGER 3.25 BULB 911905 (SOFTGOODS) ×2 IMPLANT
STOCKINETTE 4X48 STRL (DRAPES) ×2 IMPLANT
SUT CHROMIC 6 0 G 1 (SUTURE) ×2 IMPLANT
SUT ETHILON 4 0 PS 2 18 (SUTURE) IMPLANT
SUT VIC AB 4-0 P2 18 (SUTURE) IMPLANT
SWAB COLLECTION DEVICE MRSA (MISCELLANEOUS) ×2 IMPLANT
SYR BULB 3OZ (MISCELLANEOUS) ×2 IMPLANT
SYR CONTROL 10ML LL (SYRINGE) ×2 IMPLANT
TOWEL OR 17X24 6PK STRL BLUE (TOWEL DISPOSABLE) ×2 IMPLANT
TUBE ANAEROBIC SPECIMEN COL (MISCELLANEOUS) ×2 IMPLANT
UNDERPAD 30X30 (UNDERPADS AND DIAPERS) IMPLANT

## 2015-05-02 NOTE — H&P (Signed)
[name] is an 75 y.o. male.   Chief Complaint: melanotic strip right thumb  HPI:  [name] is a 75 yo male with a black lesion on his right thumb this has been present for 2 years. He is reffed by Dr Denna Haggard the discoloration has expanded to the tip. He has a groove along the entire nail plate. He is not complaining of pain.  [facility]  PEN Surgery. Mitral valve FH DM HPB ROS: cataracts, MI    Past Medical History  Diagnosis Date  . HTN (hypertension)   . GERD (gastroesophageal reflux disease)   . Osteoporosis   . CAD (coronary artery disease)   . Mitral valve prolapse   . TIA (transient ischemic attack)   . UTI (lower urinary tract infection)   . Asthma   . Complication of anesthesia     CO2 high according to patient   . Cancer [facility]) 2016    prostate    Past Surgical History  Procedure Laterality Date  . Appendectomy    . Coronary angioplasty with stent placement  04/30/2010    PCI and stenting of proximal RCA  . Mitral valve repair  06/13/2010    right mini thoracotomy for complex valvuloplasty with 76m Memo ring annuloplasty - Dr. ORoxy Manns . Prostatectomy  2016    Family History  Problem Relation Age of Onset  . Heart disease Father   . Heart disease Mother    Social History:  reports that he has never smoked. He has never used smokeless tobacco. He reports that he drinks alcohol. He reports that he does not use illicit drugs.  Allergies:  Allergies  Allergen Reactions  . Penicillins Itching and Rash    Medications Prior to Admission  Medication Sig Dispense Refill  . albuterol (PROVENTIL HFA;VENTOLIN HFA) 108 (90 BASE) MCG/ACT inhaler Inhale 2 puffs into the lungs every 6 (six) hours as needed for wheezing or shortness of breath. 1 Inhaler 6  . aspirin 81 MG tablet Take 81 mg by mouth daily.      . calcium-vitamin D (OSCAL WITH D) 500-200 MG-UNIT per tablet Take 2 tablets by mouth daily.      . Cholecalciferol (VITAMIN D3) 1000 UNITS CAPS Take 1  capsule by mouth daily.    . Coenzyme Q10 (CO Q 10) 100 MG CAPS Take 1 capsule by mouth daily.    . famotidine (PEPCID) 10 MG tablet Take 10 mg by mouth daily.     . Fluticasone Furoate-Vilanterol (BREO ELLIPTA) 100-25 MCG/INH AEPB Inhale 1 puff into the lungs daily. (Patient taking differently: Inhale 1 puff into the lungs every other day. ) 60 each 0  . losartan (COZAAR) 50 MG tablet Take 50 mg by mouth daily.     . Magnesium 400 MG CAPS Take 1 capsule by mouth daily.    . nebivolol (BYSTOLIC) 10 MG tablet Take 10 mg by mouth daily.    . rosuvastatin (CRESTOR) 10 MG tablet Take 10 mg by mouth daily.      Results for orders placed or performed during the hospital encounter of 05/02/15 (from the past 48 hour(s))  Basic metabolic panel     Status: Abnormal   Collection Time: 05/01/15 11:45 AM  Result Value Ref Range   Sodium 139 135 - 145 mmol/L   Potassium 4.5 3.5 - 5.1 mmol/L   Chloride 104 101 - 111 mmol/L   CO2 27 22 - 32 mmol/L   Glucose, Bld 84 65 - 99 mg/dL  BUN 21 (H) 6 - 20 mg/dL   Creatinine, Ser 1.31 (H) 0.61 - 1.24 mg/dL   Calcium 9.4 8.9 - 10.3 mg/dL   GFR calc non Af Amer 52 (L) >60 mL/min   GFR calc Af Amer >60 >60 mL/min    Comment: (NOTE) The eGFR has been calculated using the CKD EPI equation. This calculation has not been validated in all clinical situations. eGFR's persistently <60 mL/min signify possible Chronic Kidney Disease.    Anion gap 8 5 - 15    No results found.   Pertinent items are noted in HPI.  Blood pressure 122/57, pulse 58, temperature 97.8 F (36.6 C), temperature source Oral, resp. rate 20, height _0  (1.702 m), weight 76.204 kg (168 lb), SpO2 100 %.  General appearance: alert, cooperative and appears stated age Head: Normocephalic, without obvious abnormality Neck: no JVD Resp: clear to auscultation bilaterally Cardio: regular rate and rhythm GI: soft, non-tender; bowel sounds normal; no masses,  no organomegaly Extremities:  melanotic strip on right thumb Pulses: 2+ and symmetric Skin: Skin color, texture, turgor normal. No rashes or lesions Neurologic: Grossly normal Incision/Wound: na  Assessment/Plan Dx. Melanotic strip right thumb Plan: biopsy right thumb  Camren Lipsett R 05/02/2015, 9:16 AM

## 2015-05-02 NOTE — Op Note (Signed)
Dictation Number [id number]

## 2015-05-02 NOTE — Anesthesia Preprocedure Evaluation (Addendum)
Anesthesia Evaluation  Patient identified by MRN, date of birth, ID band Patient awake    Reviewed: Allergy & Precautions, NPO status , Patient's Chart, lab work & pertinent test results  History of Anesthesia Complications (+) history of anesthetic complications  Airway Mallampati: II  TM Distance: >3 FB     Dental  (+) Teeth Intact   Pulmonary asthma ,    breath sounds clear to auscultation       Cardiovascular hypertension, + CAD  + Valvular Problems/Murmurs MVP  Rhythm:Regular Rate:Normal     Neuro/Psych TIA Neuromuscular disease negative psych ROS   GI/Hepatic Neg liver ROS, GERD  Medicated,  Endo/Other  negative endocrine ROS  Renal/GU negative Renal ROS  negative genitourinary   Musculoskeletal negative musculoskeletal ROS (+)   Abdominal   Peds negative pediatric ROS (+)  Hematology negative hematology ROS (+)   Anesthesia Other Findings   Reproductive/Obstetrics negative OB ROS                            Lab Results  Component Value Date   WBC 11.9* 07/18/2013   HGB 11.4* 07/18/2013   HCT 32.5* 07/18/2013   MCV 91.3 07/18/2013   PLT 178 07/18/2013   Lab Results  Component Value Date   CREATININE 1.31* 05/01/2015   BUN 21* 05/01/2015   NA 139 05/01/2015   K 4.5 05/01/2015   CL 104 05/01/2015   CO2 27 05/01/2015   Lab Results  Component Value Date   INR 1.38 07/17/2013   INR 1.68* 06/13/2010   INR 1.05 06/11/2010   04/2015 EKG: normal sinus rhythm.    Anesthesia Physical Anesthesia Plan  ASA: III  Anesthesia Plan: MAC   Post-op Pain Management:    Induction: Intravenous  Airway Management Planned: Natural Airway  Additional Equipment:   Intra-op Plan:   Post-operative Plan:   Informed Consent: I have reviewed the patients History and Physical, chart, labs and discussed the procedure including the risks, benefits and alternatives for the proposed  anesthesia with the patient or authorized representative who has indicated his/her understanding and acceptance.     Plan Discussed with:   Anesthesia Plan Comments:         Anesthesia Quick Evaluation

## 2015-05-02 NOTE — Transfer of Care (Signed)
Immediate Anesthesia Transfer of Care Note  Patient: [name]  Procedure(s) Performed: Procedure(s) with comments: BIOPSY NAIL BED RIGHT THUMB (Right) - ANESTHESIA:  IV REGIONAL FAB  Patient Location: PACU  Anesthesia Type:Bier block  Level of Consciousness: awake, alert , oriented and patient cooperative  Airway & Oxygen Therapy: Patient Spontanous Breathing and Patient connected to face mask oxygen  Post-op Assessment: Report given to RN and Post -op Vital signs reviewed and stable  Post vital signs: Reviewed and stable  Last Vitals:  Filed Vitals:   05/02/15 0832  BP: 122/57  Pulse: 58  Temp: 36.6 C  Resp: 20    Complications: No apparent anesthesia complications

## 2015-05-02 NOTE — Discharge Instructions (Addendum)

## 2015-05-02 NOTE — Brief Op Note (Signed)
05/02/2015  10:20 AM  PATIENT:  [name]  75 y.o. male  PRE-OPERATIVE DIAGNOSIS:  MELANOTIC STREAK RIGHT THUMB  POST-OPERATIVE DIAGNOSIS:  MELANOTIC STREAK RIGHT THUMB  PROCEDURE:  Procedure(s) with comments: BIOPSY NAIL BED RIGHT THUMB (Right) - ANESTHESIA:  IV REGIONAL FAB  SURGEON:  Surgeon(s) and Role:    * Daryll Brod, MD - Primary  PHYSICIAN ASSISTANT:   ASSISTANTS: none   ANESTHESIA:   local and regional  EBL:     BLOOD ADMINISTERED:none  DRAINS: none   LOCAL MEDICATIONS USED:  BUPIVICAINE   SPECIMEN:  Excision  DISPOSITION OF SPECIMEN:  PATHOLOGY  COUNTS:  YES  TOURNIQUET:   Total Tourniquet Time Documented: Forearm (Right) - 28 minutes Total: Forearm (Right) - 28 minutes   DICTATION: .Other Dictation: Dictation Number [id number]  PLAN OF CARE: Discharge to home after PACU  PATIENT DISPOSITION:  PACU - hemodynamically stable.

## 2015-05-02 NOTE — Anesthesia Postprocedure Evaluation (Signed)
Anesthesia Post Note  Patient: [name]  Procedure(s) Performed: Procedure(s) (LRB): BIOPSY NAIL BED RIGHT THUMB (Right)  Patient location during evaluation: PACU Anesthesia Type: MAC and Bier Block Level of consciousness: awake and alert Pain management: pain level controlled Vital Signs Assessment: post-procedure vital signs reviewed and stable Respiratory status: spontaneous breathing, nonlabored ventilation, respiratory function stable and patient connected to nasal cannula oxygen Cardiovascular status: stable and blood pressure returned to baseline Anesthetic complications: no    Last Vitals:  Filed Vitals:   05/02/15 1100 05/02/15 1118  BP: 125/73 127/77  Pulse: 56 58  Temp:  39.9 C  Resp: 20 18    Last Pain:  Filed Vitals:   05/02/15 1119  PainSc: 0-No pain                 Effie Berkshire

## 2015-05-02 NOTE — Anesthesia Procedure Notes (Addendum)
Anesthesia Regional Block:  Bier block (IV Regional)  Pre-Anesthetic Checklist: ,, timeout performed, Correct Patient, Correct Site, Correct Laterality, Correct Procedure,, site marked, surgical consent,, at surgeon's request Needles:  Injection technique: Single-shot  Needle Type: Other      Needle Gauge: 20 and 20 G    Additional Needles: Bier block (IV Regional) Narrative:   Performed by: Personally    Procedure Name: MAC Date/Time: 05/02/2015 9:40 AM Performed by: Cyris Maalouf D Pre-anesthesia Checklist: Patient identified, Emergency Drugs available, Suction available, Patient being monitored and Timeout performed Patient Re-evaluated:Patient Re-evaluated prior to inductionOxygen Delivery Method: Simple face mask

## 2015-05-02 NOTE — Brief Op Note (Signed)
05/02/2015  10:25 AM  PATIENT:  [name]  75 y.o. male  PRE-OPERATIVE DIAGNOSIS:  MELANOTIC STREAK RIGHT THUMB  POST-OPERATIVE DIAGNOSIS:  MELANOTIC STREAK RIGHT THUMB  PROCEDURE:  Procedure(s) with comments: BIOPSY NAIL BED RIGHT THUMB (Right) - ANESTHESIA:  IV REGIONAL FAB  SURGEON:  Surgeon(s) and Role:    * Daryll Brod, MD - Primary  PHYSICIAN ASSISTANT:   ASSISTANTS: none   ANESTHESIA:   local and regional  EBL:     BLOOD ADMINISTERED:none  DRAINS: none   LOCAL MEDICATIONS USED:  BUPIVICAINE   SPECIMEN:  Excision  DISPOSITION OF SPECIMEN:  PATHOLOGY  COUNTS:  YES  TOURNIQUET:   Total Tourniquet Time Documented: Forearm (Right) - 28 minutes Total: Forearm (Right) - 28 minutes   DICTATION: .Other Dictation: Dictation Number [id number]  PLAN OF CARE: Discharge to home after PACU  PATIENT DISPOSITION:  PACU - hemodynamically stable.

## 2015-05-03 ENCOUNTER — Encounter (HOSPITAL_BASED_OUTPATIENT_CLINIC_OR_DEPARTMENT_OTHER): Payer: Self-pay | Admitting: Orthopedic Surgery

## 2015-05-03 NOTE — Op Note (Signed)
[name], [name] NO.:  [id number]  MEDICAL RECORD NO.:  [id number]  LOCATION:                                 FACILITY:  PHYSICIAN:  Daryll Brod, M.D.            DATE OF BIRTH:  DATE OF PROCEDURE: DATE OF DISCHARGE:                              OPERATIVE REPORT   PREOPERATIVE DIAGNOSIS:  Melanotic lesion, right thumb x2.  POSTOPERATIVE DIAGNOSIS:  Melanotic lesion, right thumb x2.  OPERATION:  Biopsy of the nail bed x2, right thumb.  SURGEON:  Daryll Brod, M.D.  ANESTHESIA:  Forearm IV regional with metacarpal block.  DATE OF OPERATION:  May 02, 2015.  HISTORY:  The patient is a 75 year old male, with a 2-year history of a melanotic stripe going down his thumb nail plate, right hand, this has developed a 2nd lesion at the nail matrix skin fold distally, separated from the original area.  He was admitted for biopsy.  He is aware of risks and complications including infection recurrence, injuries to arteries, nerves, and tendons, possibility of this being a melanoma.  At the preoperative area, the patient is seen, the extremity marked by both patient and surgeon.  Antibiotic given.  PROCEDURE IN DETAIL:  The patient was brought to the operating room, where a forearm-based IV regional anesthetic was carried out without difficulty.  He was prepped using ChloraPrep, supine position with the right arm free.  A 3-minute dry time was allowed.  Time-out taken, confirming the patient and procedure.  The nail plate was removed.  A metacarpal block given with 0.25% bupivacaine without epinephrine approximately 8 mL was used.  The nail plate was discolored along its entire length.  On removal, a black lesion, punctate in nature, which was easily movable was present in the proximal volar matrix at the nail fold.  This was elliptically excised and sent to Pathology.  Cultures were taken for both aerobic and anaerobic fungal and AFB.  The wound was irrigated  and the nail bed and closed with interrupted 6-0 chromic sutures.  The second lesion at the distal junction of the nail matrix and skin was elliptically excised also.  This was sent separately.  The wound was irrigated.  Each biopsy measured approximately 5 x 2 mm.  These were sent separately.  This wound closed with a new interrupted 6-0 chromic.  A sterile nonadherent gauze was placed into the nail fold.  Sterile compressive dressing and splint applied.  On deflation of the tourniquet, remaining fingers pinked.  He was taken to the recovery room for observation in satisfactory condition.  He will be discharged home to return to the [place] in 1 week, on [place].          ______________________________ Daryll Brod, M.D.     GK/MEDQ  D:  05/02/2015  T:  05/02/2015  Job:  [id number]

## 2015-05-05 LAB — WOUND CULTURE: Culture: NO GROWTH

## 2015-05-07 LAB — ANAEROBIC CULTURE

## 2015-05-11 ENCOUNTER — Telehealth: Payer: Self-pay | Admitting: Oncology

## 2015-05-11 NOTE — Telephone Encounter (Signed)
new patient appt-left message for patient to return call  °

## 2015-05-23 DIAGNOSIS — I2510 Atherosclerotic heart disease of native coronary artery without angina pectoris: Secondary | ICD-10-CM | POA: Diagnosis not present

## 2015-05-23 DIAGNOSIS — Z7982 Long term (current) use of aspirin: Secondary | ICD-10-CM | POA: Diagnosis not present

## 2015-05-23 DIAGNOSIS — M810 Age-related osteoporosis without current pathological fracture: Secondary | ICD-10-CM | POA: Diagnosis not present

## 2015-05-23 DIAGNOSIS — Z79899 Other long term (current) drug therapy: Secondary | ICD-10-CM | POA: Diagnosis not present

## 2015-05-23 DIAGNOSIS — I10 Essential (primary) hypertension: Secondary | ICD-10-CM | POA: Diagnosis not present

## 2015-05-23 DIAGNOSIS — Z7951 Long term (current) use of inhaled steroids: Secondary | ICD-10-CM | POA: Diagnosis not present

## 2015-05-23 DIAGNOSIS — J45909 Unspecified asthma, uncomplicated: Secondary | ICD-10-CM | POA: Diagnosis not present

## 2015-05-23 DIAGNOSIS — K219 Gastro-esophageal reflux disease without esophagitis: Secondary | ICD-10-CM | POA: Diagnosis not present

## 2015-05-23 DIAGNOSIS — C439 Malignant melanoma of skin, unspecified: Secondary | ICD-10-CM | POA: Diagnosis not present

## 2015-05-23 DIAGNOSIS — Z880 Allergy status to penicillin: Secondary | ICD-10-CM | POA: Diagnosis not present

## 2015-05-26 DIAGNOSIS — C61 Malignant neoplasm of prostate: Secondary | ICD-10-CM | POA: Diagnosis not present

## 2015-05-26 DIAGNOSIS — N3943 Post-void dribbling: Secondary | ICD-10-CM | POA: Diagnosis not present

## 2015-05-30 LAB — FUNGUS CULTURE W SMEAR: Fungal Smear: NONE SEEN

## 2015-06-02 ENCOUNTER — Other Ambulatory Visit: Payer: Self-pay

## 2015-06-02 DIAGNOSIS — Z79899 Other long term (current) drug therapy: Secondary | ICD-10-CM | POA: Diagnosis not present

## 2015-06-02 DIAGNOSIS — J45909 Unspecified asthma, uncomplicated: Secondary | ICD-10-CM | POA: Diagnosis not present

## 2015-06-02 DIAGNOSIS — J9811 Atelectasis: Secondary | ICD-10-CM | POA: Diagnosis not present

## 2015-06-02 DIAGNOSIS — C439 Malignant melanoma of skin, unspecified: Secondary | ICD-10-CM | POA: Diagnosis not present

## 2015-06-02 DIAGNOSIS — I2510 Atherosclerotic heart disease of native coronary artery without angina pectoris: Secondary | ICD-10-CM | POA: Diagnosis not present

## 2015-06-02 DIAGNOSIS — C4361 Malignant melanoma of right upper limb, including shoulder: Secondary | ICD-10-CM | POA: Diagnosis not present

## 2015-06-02 DIAGNOSIS — R918 Other nonspecific abnormal finding of lung field: Secondary | ICD-10-CM | POA: Diagnosis not present

## 2015-06-02 DIAGNOSIS — Z7982 Long term (current) use of aspirin: Secondary | ICD-10-CM | POA: Diagnosis not present

## 2015-06-02 DIAGNOSIS — I252 Old myocardial infarction: Secondary | ICD-10-CM | POA: Diagnosis not present

## 2015-06-02 DIAGNOSIS — C4360 Malignant melanoma of unspecified upper limb, including shoulder: Secondary | ICD-10-CM | POA: Insufficient documentation

## 2015-06-02 DIAGNOSIS — I10 Essential (primary) hypertension: Secondary | ICD-10-CM | POA: Diagnosis not present

## 2015-06-02 DIAGNOSIS — Z880 Allergy status to penicillin: Secondary | ICD-10-CM | POA: Diagnosis not present

## 2015-06-05 ENCOUNTER — Other Ambulatory Visit: Payer: Medicare Other

## 2015-06-05 DIAGNOSIS — D0361 Melanoma in situ of right upper limb, including shoulder: Secondary | ICD-10-CM | POA: Diagnosis not present

## 2015-06-12 ENCOUNTER — Ambulatory Visit: Payer: Medicare Other | Admitting: Oncology

## 2015-06-15 DIAGNOSIS — I351 Nonrheumatic aortic (valve) insufficiency: Secondary | ICD-10-CM | POA: Insufficient documentation

## 2015-06-15 DIAGNOSIS — C4361 Malignant melanoma of right upper limb, including shoulder: Secondary | ICD-10-CM | POA: Diagnosis not present

## 2015-06-15 LAB — AFB CULTURE WITH SMEAR (NOT AT ARMC): Acid Fast Smear: NONE SEEN

## 2015-06-19 ENCOUNTER — Telehealth: Payer: Self-pay | Admitting: Internal Medicine

## 2015-06-19 DIAGNOSIS — N400 Enlarged prostate without lower urinary tract symptoms: Secondary | ICD-10-CM | POA: Diagnosis not present

## 2015-06-19 DIAGNOSIS — N529 Male erectile dysfunction, unspecified: Secondary | ICD-10-CM | POA: Diagnosis not present

## 2015-06-19 DIAGNOSIS — C439 Malignant melanoma of skin, unspecified: Secondary | ICD-10-CM | POA: Diagnosis not present

## 2015-06-19 DIAGNOSIS — I10 Essential (primary) hypertension: Secondary | ICD-10-CM | POA: Diagnosis not present

## 2015-06-19 DIAGNOSIS — J986 Disorders of diaphragm: Secondary | ICD-10-CM | POA: Diagnosis not present

## 2015-06-19 DIAGNOSIS — J4530 Mild persistent asthma, uncomplicated: Secondary | ICD-10-CM | POA: Diagnosis not present

## 2015-06-19 DIAGNOSIS — E785 Hyperlipidemia, unspecified: Secondary | ICD-10-CM | POA: Diagnosis not present

## 2015-06-19 DIAGNOSIS — Z7951 Long term (current) use of inhaled steroids: Secondary | ICD-10-CM | POA: Diagnosis not present

## 2015-06-19 DIAGNOSIS — Z7982 Long term (current) use of aspirin: Secondary | ICD-10-CM | POA: Diagnosis not present

## 2015-06-19 DIAGNOSIS — K219 Gastro-esophageal reflux disease without esophagitis: Secondary | ICD-10-CM | POA: Diagnosis not present

## 2015-06-19 DIAGNOSIS — I2510 Atherosclerotic heart disease of native coronary artery without angina pectoris: Secondary | ICD-10-CM | POA: Diagnosis not present

## 2015-06-19 DIAGNOSIS — Z79899 Other long term (current) drug therapy: Secondary | ICD-10-CM | POA: Diagnosis not present

## 2015-06-19 DIAGNOSIS — Z955 Presence of coronary angioplasty implant and graft: Secondary | ICD-10-CM | POA: Diagnosis not present

## 2015-06-19 DIAGNOSIS — I889 Nonspecific lymphadenitis, unspecified: Secondary | ICD-10-CM | POA: Diagnosis not present

## 2015-06-19 DIAGNOSIS — Z8546 Personal history of malignant neoplasm of prostate: Secondary | ICD-10-CM | POA: Diagnosis not present

## 2015-06-19 DIAGNOSIS — C4361 Malignant melanoma of right upper limb, including shoulder: Secondary | ICD-10-CM | POA: Diagnosis not present

## 2015-06-19 DIAGNOSIS — M810 Age-related osteoporosis without current pathological fracture: Secondary | ICD-10-CM | POA: Diagnosis not present

## 2015-06-19 DIAGNOSIS — I252 Old myocardial infarction: Secondary | ICD-10-CM | POA: Diagnosis not present

## 2015-06-19 DIAGNOSIS — Z880 Allergy status to penicillin: Secondary | ICD-10-CM | POA: Diagnosis not present

## 2015-06-19 MED ORDER — FLUTICASONE FUROATE-VILANTEROL 100-25 MCG/INH IN AEPB: 1.0000 | INHALATION_SPRAY | Freq: Every day | RESPIRATORY_TRACT | Status: DC

## 2015-06-19 NOTE — Telephone Encounter (Signed)
Called and spoke with patient's daughter, [name].  Advised her that CXR has been received from [facility].  (in [facility]) Also advised her that we have samples of Breo that she can pick up at the front desk for patient. Nothing further needed.

## 2015-06-21 ENCOUNTER — Telehealth: Payer: Self-pay | Admitting: Internal Medicine

## 2015-06-21 MED ORDER — FLUTICASONE FUROATE-VILANTEROL 100-25 MCG/INH IN AEPB: 1.0000 | INHALATION_SPRAY | Freq: Every day | RESPIRATORY_TRACT | Status: AC

## 2015-06-21 NOTE — Telephone Encounter (Signed)
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 Please move this fu appt to 3 months but if he wants to see me in April that is fine. Also he wants BREO samples -pls give hm. Any dose fine

## 2015-06-21 NOTE — Telephone Encounter (Signed)
Called and spoke with the pt. Informed pt of MR's recs. Pt is requesting to keep his ov in on 08/03/15. I informed him that samples are at the front office and ready for pick. He stated that he would come tomorrow and pick up the samples. Pt voiced understanding and had no further questions. Nothing further needed. Samples placed at the front office.

## 2015-06-22 ENCOUNTER — Ambulatory Visit: Payer: Medicare Other | Admitting: Internal Medicine

## 2015-06-29 DIAGNOSIS — E559 Vitamin D deficiency, unspecified: Secondary | ICD-10-CM | POA: Diagnosis not present

## 2015-06-29 DIAGNOSIS — Z0001 Encounter for general adult medical examination with abnormal findings: Secondary | ICD-10-CM | POA: Diagnosis not present

## 2015-06-29 DIAGNOSIS — Z0000 Encounter for general adult medical examination without abnormal findings: Secondary | ICD-10-CM | POA: Diagnosis not present

## 2015-06-29 DIAGNOSIS — E039 Hypothyroidism, unspecified: Secondary | ICD-10-CM | POA: Diagnosis not present

## 2015-07-04 DIAGNOSIS — I2510 Atherosclerotic heart disease of native coronary artery without angina pectoris: Secondary | ICD-10-CM | POA: Diagnosis not present

## 2015-07-04 DIAGNOSIS — E559 Vitamin D deficiency, unspecified: Secondary | ICD-10-CM | POA: Diagnosis not present

## 2015-07-04 DIAGNOSIS — I10 Essential (primary) hypertension: Secondary | ICD-10-CM | POA: Diagnosis not present

## 2015-07-04 DIAGNOSIS — M79601 Pain in right arm: Secondary | ICD-10-CM | POA: Diagnosis not present

## 2015-07-04 DIAGNOSIS — Z89011 Acquired absence of right thumb: Secondary | ICD-10-CM | POA: Diagnosis not present

## 2015-07-04 DIAGNOSIS — R141 Gas pain: Secondary | ICD-10-CM | POA: Diagnosis not present

## 2015-07-04 DIAGNOSIS — C439 Malignant melanoma of skin, unspecified: Secondary | ICD-10-CM | POA: Diagnosis not present

## 2015-07-13 ENCOUNTER — Encounter: Payer: Self-pay | Admitting: Internal Medicine

## 2015-07-13 ENCOUNTER — Ambulatory Visit (INDEPENDENT_AMBULATORY_CARE_PROVIDER_SITE_OTHER): Payer: Medicare Other | Admitting: Internal Medicine

## 2015-07-13 VITALS — BP 136/78 | HR 55 | Ht 67.0 in | Wt 170.8 lb

## 2015-07-13 DIAGNOSIS — J4540 Moderate persistent asthma, uncomplicated: Secondary | ICD-10-CM

## 2015-07-13 NOTE — Progress Notes (Signed)
Subjective:     Patient ID: [name], male   DOB: [birth year], 75 y.o.   MRN: [id number]  HPI  #Mitral valve replacement 2011,    #S/p RCA stent for asymptomatic > 99%  RCA stenosis in 2011 prior to MVR.     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 in 2011),   #chronic multi-year waxing and waning voice hoarseness  - per hx 2014 workup at [facility] - Dx with LPR and s/p voice rehab  - symptoms improve but not resolve with H2 blockade or PPI  #chronic cough end 2014 through spring 2015 post [place] trip  -due to  GERD, post viral reactive airway and asthma  -  advair started April 2015  #Fatigue / dyspnea end 2014 through summer 2015 - following viral infecion end 2014 [place] trip and then urosepsis admission at [facility] March/early April 2015  - No ILD CT chest 07/13/13   - CPST test 08/06/13 (below) done while he was taking his half his usual coreg and while he still had cough showed - poor exercise capacity (52%) with lowered anaerobic threshld. He was wheezing bilaterally at end of test and had significant drop in his Fev1 ; confirming exercise induced asthma. In additon, he had circulatory limitation in terms of a heart rate gap and flat stroke volume. There was NO ischemic response  #Hx of Failed PFT all life   #asthma   -diagnosed spring 2015 in midst of fatigue, cough, dyspnea following viral infection end 2014 [place] trop   -on basis of abnormal PFT, CPST showing EIB and FeNO > 25 at [place] - spring 2015  #Micropulm nodules - seen CT 07/13/13   OV 01/27/2014  [name] returns for followup of chronic hoarseness, chronic cough, and asthma.  LAst visit advised advair, and aggressive gerd precaution (no fish oil, ppi and lean diet) and strick compliance with same . It appers he took his advair for a while. THis helped cough and then cough resolved. AFter this he has been  only 25% compliant with advair taking it on days when he i s abit more symptomatic with cough and hoarseness. Marland Kitchen His fatigue and dyspnea though seems independent of the cough and hoarseness and was related to his urosepsis in April 2015 and this has resolved as he reovered from it. He has been exercising in gym and feeling well. He is still bothered bu the hoarse voice and is random though H2 blockade or PPI helps. However, he feels he does not need these meds on daily basis.  Spirometry today shows significant decline in lung function cmpared to [facility] 2015. Fev1 0;96L/31%, ratio 69 (baseline march 2015 pre-bd was 1.3L/44, ratio 75 with 15% Post BD response to an fev1 1.47L/51%).  Exhaled NO - 26 and c/w asthma  He is reluctant to take MDIs. Says he wants data and feels it should be data that he charts at home in a tabular column based However, after hearing of lung function decline he is more acceptable to take his mdi on a regular basis.    Past, Family, Social reviewed: he is once again planning [place] trip 02/28/14    OV 02/22/2014  Chief Complaint  Patient presents with  . Follow-up    Pt states his SOB has improved since last OV. Pt c/o hoarseness. Pt denies cough and CP/tightness.    Feels better; improved ET with stairs. But  still doing advair 1 puff once daily only. Due to go to [place] in few days. Will have flu shot with pcp. Spirometyr today shows: VOice up and down hoarse like before without change. Good with gargling. COuld not do spirometry today todue to malfunction in wifi networkds    AcuteOV 05/31/2014   [name] presents for follow-up. I met his daughter socially a few days ago and there was concern that when he picks up a viral infection that he has a hard time bouncing back. In addition daughter was concerned about persistent hoarseness of the voice. Last saw patient in November 2015 prior to [place] trip. At that visit I insisted on continued Advair compliance. He  said he did the same and did not have any respiratory problems during the trip to [place].  Now for the lastfew weeks he picked upper respiratory infection with a lot of mucus drainage. A week and with illness he was seen at his primary care physician's office and was given Z-Pak. He is not so sure the Z-Pak helped him or he improved spontaneously. In balance he thinks that some of the mucus drainage might have improved with a Z-Pak. Currently he says that he is back to his baseline health. His asthma control questionnaire 5. Scale average is 0.6 showing good control of asthma. He says he is compliant with his Advair and does not miss any dose. His chronic hoarseness continues with waxing and waning quality. We again went over this. It is clear from his description that at [facility] that they have described and diagnosed him with paradoxical vocal cord dysfunction. He does not feel that that is another need for repeat ENT evaluation.  The main concerning thing today is that his lung function continues to be severely restrictive/obstructive. It is improved compared to fall 2015 since he went on Advair but only a little bit. It still reduced compared to a year ago in March 2015. However he states that he does not have any limitations with his daily exercise routine.     OV 08/08/2014  Chief Complaint  Patient presents with  . Follow-up    Pt stated his breathing has imporved since last OV. Pt stated does not need his rescue inhaler as much as he use to. Pt denies cough, SOB, and CP/tightness.      Follow-up moderate persistent asthma  Since last visit and for every 2016 I switched him to [facility] and with this he's had significant relief. He says that this is the best inhaler ever even though this is more expensive. He does not like Advair. He says that he is able to hold his breath many more seconds then when he was on other inhalers. He likes to once a day concept. He does not want to switch to other  inhalers. He prefers managing with some samples and occasionally painful 1. Currently feels his asthma is well controlled. Asthma control questionnaire is 0.2 symptoms for the last 1 weeks adjusting very good control. 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 Follow-up    Pt states his breathing has improved since last OV. Pt denies cough and CP/tightness.     Follow-up moderate persistent asthma   He presents with his wife. He is now on low-dose BREOP mouth once daily. He likes it much better than Advair. At times he is not certain that he is getting the right quantity of 30 but overall he is doing well. Asthma control questionnaire 5. scale is 0.2 showing good control. He still has a strap on his right foot but he is able to play golf but he is limited with his exercise and he is gained weight. He thinks part of the weight gain is dutasteride's and therefore wants to reduce this inhaler. He's planning a trip to [place] in December 2016.  Currently because of asthma he says that he does not wake up in the middle of the night. When he wakes up early in the morning there are no symptoms from asthma. He has some physical limitations but this is not because of asthma. He has very little shortness of breath. He is not having any wheeze. But he does use his albuterol 1-2 puffs as needed on most days.  Past hx: dx with early stage Prostate CA at [facility]  n  obs Rx   OV 02/28/2015  Chief Complaint  Patient presents with  . Follow-up    Pt states he has a recent cold. Pt states his cough was productive but now is non prod. Pt c/o sinus congestion. Pt states his SOB at baseline. Pt denies CP/tightness and f/c/s.    follow-up moderate persistent asthma follow-up moderate persistent asthma   He is now taking Brio 3 times a week on 1 week and then 4 times a week on another week. He says his asthma is well controlled. The last 2 days he's had a cold with some yellow mucus just once a day. He is not any worse in terms of his asthma. He does not feel like he needs antibiotic. He does not feel he needs steroids. Overall he feels his asthma control is excellent. He does not have any nocturnal symptoms or daytime symptoms. He is worried about the long-term side effects of steroids and therefore he wants to continue with his 3 times a week regimen of the Brio  Past medical history  0 he is now status post prostate cancer surgery at [facility] approximately 5-6 weeks ago. He denies any postoperative pulmonary complications. However at the time of surgery when I spoke to his daughter she mentioned something about hypercapnia that was transient and brief either  perioperative or postop. Patient is unaware of this  - He has gained weight because of inactivity following his right lower extremity ankle injury and then the prostate cancer surgery. No history of any DVT. He plans to resume exercise soon  - He is up-to-date with his flu shot  OV 07/19/2015  Chief Complaint  Patient presents with  . Follow-up    Pt states his breathing is unchanged, pt states his breathing is doing well. Pt had prostate surgery in late 2016 and had melanoma removed from right thumb. Pt denies cough and CP/tightness.     Presents with wife. Last seen nov 2016. In interim has had new rt thumb melanoma diagnosis. S/p excision at [facility]. No axillary  nodes. His niece died  last week from ovarian cancer. HAs gained some weight. Asthma is considered stable. Reports different tastes with different ellipta   ASthma Contril panel March 2015  Oct 2015 05/31/2014  2//22/16 08/08/2014  11/08/2014  02/28/2015   Rx plan at visit entry    With prednisone empiric triak BREO high dose since feb   Brio 3 times a week   Fev1 1.3L/44,% -> 1.47L 0.96/39% 1.1/37% 1.26L/43% x  x  FVC   1.6L/40% 1.75L/44% x  x  Ratio 75 69 70 72 x  x  fef 25-75%     x  x  TLC     x  x  DLCO     x  xx  ACQ-5 (1 week)   0.6  0.3 0.2 x  ACT (4 weeks)     x  x  FeNO  25 at Dr Remus Blake office   21 ppb 08/08/2014  23 x  Rx rec at end of visit  Rec to restart advair  Start BREO Taper to low dose breo reduc eto alt day breo  xBrio 3 times a week        has a past medical history of HTN (hypertension); GERD (gastroesophageal reflux disease); Osteoporosis; CAD (coronary artery disease); Mitral valve prolapse; TIA (transient ischemic attack); UTI (lower urinary tract infection); Asthma; Complication of anesthesia; and Cancer ([place]) (2016).   reports that he has never smoked. He has never used smokeless tobacco.  Past Surgical History  Procedure Laterality Date  . Appendectomy    . Coronary angioplasty with stent placement  04/30/2010    PCI and stenting of proximal RCA  . Mitral valve repair  06/13/2010    right mini thoracotomy for complex valvuloplasty with 85m Memo ring annuloplasty - Dr. ORoxy Manns . Prostatectomy  2016  . Nailbed repair Right 05/02/2015    Procedure: BIOPSY NAIL BED RIGHT THUMB;  Surgeon: GDaryll Brod MD;  Location: [facility]  Service: Orthopedics;  Laterality: Right;  ANESTHESIA:  IV REGIONAL FAB    Allergies  Allergen Reactions  . Penicillins Itching and Rash    Immunization History  Administered Date(s) Administered  . Influenza Split 07/08/2013  . Influenza,inj,Quad PF,36+ Mos 02/27/2015  . Pneumococcal Polysaccharide-23 04/16/2011    Family History   Problem Relation Age of Onset  . Heart disease Father   . Heart disease Mother      Current outpatient prescriptions:  .  aspirin 81 MG tablet, Take 81 mg by mouth daily.  , Disp: , Rfl:  .  calcium-vitamin D (OSCAL WITH D) 500-200 MG-UNIT per tablet, Take 2 tablets by mouth daily.  , Disp: , Rfl:  .  Cholecalciferol (VITAMIN D3) 1000 UNITS CAPS, Take 1 capsule by mouth daily., Disp: , Rfl:  .  Coenzyme Q10 (CO Q 10) 100 MG CAPS, Take 1 capsule by mouth daily., Disp: , Rfl:  .  famotidine (PEPCID) 10 MG tablet, Take 10 mg by mouth daily. , Disp: , Rfl:  .  fluticasone furoate-vilanterol (BREO ELLIPTA) 100-25 MCG/INH AEPB, Inhale 1 puff into the lungs daily. (Patient taking differently: Inhale 1 puff into the lungs every other day. ), Disp: 2 each, Rfl: 0 .  losartan (COZAAR) 50 MG tablet, Take 50 mg by mouth daily. , Disp: , Rfl:  .  Magnesium 400 MG CAPS, Take 1 capsule by mouth daily., Disp: , Rfl:  .  nebivolol (BYSTOLIC) 10 MG tablet, Take 10 mg by mouth daily., Disp: ,  Rfl:  .  rosuvastatin (CRESTOR) 10 MG tablet, Take 10 mg by mouth daily., Disp: , Rfl:  .  albuterol (PROVENTIL HFA;VENTOLIN HFA) 108 (90 BASE) MCG/ACT inhaler, Inhale 2 puffs into the lungs every 6 (six) hours as needed for wheezing or shortness of breath. (Patient not taking: Reported on 07/13/2015), Disp: 1 Inhaler, Rfl: 6     Review of Systems     Objective:   Physical Exam  Constitutional: He is oriented to person, place, and time. He appears well-developed and well-nourished. No distress.  HENT:  Head: Normocephalic and atraumatic.  Right Ear: External ear normal.  Left Ear: External ear normal.  Mouth/Throat: Oropharynx is clear and moist. No oropharyngeal exudate.  Eyes: Conjunctivae and EOM are normal. Pupils are equal, round, and reactive to light. Right eye exhibits no discharge. Left eye exhibits no discharge. No scleral icterus.  Neck: Normal range of motion. Neck supple. No JVD present. No tracheal  deviation present. No thyromegaly present.  Cardiovascular: Normal rate, regular rhythm and intact distal pulses.  Exam reveals no gallop and no friction rub.   No murmur heard. Pulmonary/Chest: Effort normal and breath sounds normal. No respiratory distress. He has no wheezes. He has no rales. He exhibits no tenderness.  Abdominal: Soft. Bowel sounds are normal. He exhibits no distension and no mass. There is no tenderness. There is no rebound and no guarding.  Musculoskeletal: Normal range of motion. He exhibits no edema or tenderness.  Lymphadenopathy:    He has no cervical adenopathy.  Neurological: He is alert and oriented to person, place, and time. He has normal reflexes. No cranial nerve deficit. Coordination normal.  Skin: Skin is warm and dry. No rash noted. He is not diaphoretic. No erythema. No pallor.  Psychiatric: He has a normal mood and affect. His behavior is normal. Judgment and thought content normal.  Nursing note and vitals reviewed.  Filed Vitals:   07/13/15 1215  BP: 136/78  Pulse: 55  Height: 5' 7" (1.702 m)  Weight: 170 lb 12.8 oz (77.474 kg)  SpO2: 98%        Assessment:       ICD-9-CM ICD-10-CM   1. Asthma, moderate persistent, well-controlled 493.90 J45.40         Plan:      Doing well Not sure why different tastes from different  [place] for breo every other day Call before air travel to [place] - can consider low dose xarelto v stocking  Followup 6 months or sooner if needed

## 2015-07-13 NOTE — Patient Instructions (Signed)
ICD-9-CM ICD-10-CM   1. Asthma, moderate persistent, well-controlled 493.90 J45.40     Doing well Not sure why different tastes from different  [place] for breo every other day Call before air travel to [place] - can consider low dose xarelto v stocking  Followup 6 months or sooner if needed

## 2015-08-03 ENCOUNTER — Ambulatory Visit: Payer: Medicare Other | Admitting: Internal Medicine

## 2015-08-23 ENCOUNTER — Other Ambulatory Visit: Payer: Self-pay | Admitting: Oncology

## 2015-08-29 DIAGNOSIS — R21 Rash and other nonspecific skin eruption: Secondary | ICD-10-CM | POA: Diagnosis not present

## 2015-09-05 ENCOUNTER — Other Ambulatory Visit: Payer: Self-pay | Admitting: Oncology

## 2015-09-06 ENCOUNTER — Encounter: Payer: Self-pay | Admitting: Physical Therapy

## 2015-09-06 ENCOUNTER — Ambulatory Visit: Payer: Medicare Other | Attending: Internal Medicine | Admitting: Physical Therapy

## 2015-09-06 DIAGNOSIS — I890 Lymphedema, not elsewhere classified: Secondary | ICD-10-CM

## 2015-09-06 DIAGNOSIS — M6281 Muscle weakness (generalized): Secondary | ICD-10-CM | POA: Diagnosis not present

## 2015-09-06 NOTE — Therapy (Signed)
[place], [place], [address] Phone: [phone]   Fax:  [phone]  Physical Therapy Evaluation  Patient Details  Name: [name] MRN: [id number] Date of Birth: 1940-10-19 No Data Recorded  Encounter Date: 09/06/2015      PT End of Session - 09/06/15 1221    Visit Number 1   Number of Visits 10   Date for PT Re-Evaluation 09/25/15   PT Start Time 1016   PT Stop Time 1115   PT Time Calculation (min) 59 min   Activity Tolerance Patient tolerated treatment well   Behavior During Therapy [facility] for tasks assessed/performed      Past Medical History  Diagnosis Date  . HTN (hypertension)   . GERD (gastroesophageal reflux disease)   . Osteoporosis   . CAD (coronary artery disease)   . Mitral valve prolapse   . TIA (transient ischemic attack)   . UTI (lower urinary tract infection)   . Asthma   . Complication of anesthesia     CO2 high according to patient   . Cancer [facility]) 2016    prostate    Past Surgical History  Procedure Laterality Date  . Appendectomy    . Coronary angioplasty with stent placement  04/30/2010    PCI and stenting of proximal RCA  . Mitral valve repair  06/13/2010    right mini thoracotomy for complex valvuloplasty with 88mm Memo ring annuloplasty - Dr. Roxy Manns  . Prostatectomy  2016  . Nailbed repair Right 05/02/2015    Procedure: BIOPSY NAIL BED RIGHT THUMB;  Surgeon: Daryll Brod, MD;  Location: [place];  Service: Orthopedics;  Laterality: Right;  ANESTHESIA:  IV REGIONAL FAB    There were no vitals filed for this visit.       Subjective Assessment - 09/06/15 1028    Subjective I have this swelling in my side and sometimes my arm hurts. My hand is a little swollen. I had melanoma under my right thumb nail and they took off my thumb and removed six lymph nodes but none werre positive.    Patient is accompained by: Family member   Pertinent  History pt with history of melanoma under right thumb - underwent thumb amputation and had a lymph node biopsy with removal of 6 lymph nodes - all were negative - pt now with truncal edema   Patient Stated Goals to get swelling under control, to improve strength of RUE   Currently in Pain? Yes   Pain Score 2    Pain Location Other (Comment)  trunk   Pain Orientation Right   Pain Descriptors / Indicators Heaviness   Pain Onset More than a month ago   Pain Frequency Occasional   Aggravating Factors  using the RUE   Pain Relieving Factors just sitting   Effect of Pain on Daily Activities using RUE less            [facility] PT Assessment - 09/06/15 0001    Assessment   Medical Diagnosis melanoma under right thumb nail   Onset Date/Surgical Date 06/19/15   Hand Dominance Left   Prior Therapy none   Precautions   Precautions Other (comment)  at risk for lymphedema   Restrictions   Weight Bearing Restrictions No   Balance Screen   Has the patient fallen in the past 6 months No   Has the patient had a decrease in activity level because of a fear of falling?  No   Is the patient reluctant to leave their home because of a fear of falling?  No   Home Social worker Private residence   Living Arrangements Spouse/significant other;Children   Available Help at Discharge Family   Type of [place] to enter   Entrance Stairs-Number of Steps 1   Entrance Stairs-Rails None   Home Layout Two level   Alternate Level Stairs-Number of Steps 14   Alternate Level Stairs-Rails Left   Prior Function   Level of Independence Independent   Vocation Retired   Leisure pt states he exercises 3-4 days/wk for an hour   Cognition   Overall Cognitive Status Within Functional Limits for tasks assessed   Observation/Other Assessments   Other Surveys  --  LLIS 47% impairment           LYMPHEDEMA/ONCOLOGY QUESTIONNAIRE - 09/06/15 1046    Type   Cancer Type  melanoma   Surgeries   Other Surgery Date 06/19/15   Number Lymph Nodes Removed 6   Date Lymphedema/Swelling Started   Date 06/19/15   Treatment   Active Chemotherapy Treatment No   Past Chemotherapy Treatment No   Active Radiation Treatment No   Past Radiation Treatment No   What other symptoms do you have   Are you Having Heaviness or Tightness Yes   Are you having Pain Yes   Are you having pitting edema No   Is it Hard or Difficult finding clothes that fit No   Do you have infections No   Is there Decreased scar mobility No   Lymphedema Assessments   Lymphedema Assessments Upper extremities   Right Upper Extremity Lymphedema   15 cm Proximal to Olecranon Process 26.9 cm   Olecranon Process 24.5 cm   15 cm Proximal to Ulnar Styloid Process 20.8 cm   Just Proximal to Ulnar Styloid Process 15.7 cm   Across Hand at [employer] 19.7 cm   At [place] of 2nd Digit 6.5 cm   Left Upper Extremity Lymphedema   15 cm Proximal to Olecranon Process 26.3 cm   Olecranon Process 24.7 cm   15 cm Proximal to Ulnar Styloid Process 21.5 cm   Just Proximal to Ulnar Styloid Process 15.4 cm   Across Hand at [employer] 20.2 cm   At [place] of 2nd Digit 6.7 cm                        PT Education - 09/06/15 1220    Education provided Yes   Education Details lymphedema risk reduction practices, where and how to obtain compression sleeve, glove and tank top   Person(s) Educated Patient;Spouse   Methods Explanation;Demonstration;Handout   Comprehension Verbalized understanding                [facility] Goals - 09/06/15 1232    CC Long Term Goal  #1   Title Pt to be independent in self manual lymphatic drainage for long term management of edema   Time 3   Period Weeks   Status New   CC Long Term Goal  #2   Title Pt to obtain appropriate compression garments from DME supplier   Time 3   Period Weeks   Status New   CC Long Term Goal  #3   Title Pt to be  independent in home exercise program for strengthening of RUE   Time 3  Period Weeks   Status New   CC Long Term Goal  #4   Title Pt to report a 50% improvement in truncal edema for improved comfort   Time 3   Period Weeks   Status New            Plan - 09/06/15 Jul 17, 1219    Clinical Impression Statement Pt with history of melanoma under right thumb nail and underwent thumb amputation and sentinel lymph node biopsy. Pt now has right trunk and chest edema. There is some slight edema that can be visualized in right hand but no measureable difference between left and right UE circumferential measurements. Pt is flying out of the country in two weeks and will be gone for a month. Pt would benefit from skilled PT services to decrease truncal edema, get patient an appropriate compression sleeve, glove, and tank top and to teach pt self manual lymphatic drainage.    Rehab Potential Excellent   Clinical Impairments Affecting Rehab Potential pt leaving country in two weeks   PT Frequency 3x / week   PT Duration 3 weeks   PT Treatment/Interventions Manual lymph drainage;Manual techniques;Therapeutic exercise;Taping;ADLs/Self Care Home Management;Patient/family education;DME Instruction;Compression bandaging   PT Next Visit Plan begin MLD to right trunk and chest - teach self MLD give handout, assess UE strength    Recommended Other Services pt going to [facility] to be measured for compression garments   Consulted and Agree with Plan of Care Patient;Family member/caregiver      Patient will benefit from skilled therapeutic intervention in order to improve the following deficits and impairments:  Decreased strength, Increased edema, Decreased knowledge of use of DME  Visit Diagnosis: Lymphedema, not elsewhere classified - Plan: PT plan of care cert/re-cert  Muscle weakness (generalized) - Plan: PT plan of care cert/re-cert      G-Codes - [id number] 07/17/1235    Functional Assessment Tool Used  LLIS   Functional Limitation Other PT primary   Other PT Primary Current Status [id number]) At least 40 percent but less than 60 percent impaired, limited or restricted   Other PT Primary Goal Status [id number]) At least 20 percent but less than 40 percent impaired, limited or restricted       Problem List Patient Active Problem List   Diagnosis Date Noted  . Asthma, moderate persistent, well-controlled 08/08/2014  . Dyspnea 01/27/2014  . Asthma, moderate persistent, poorly-controlled 01/27/2014  . Hypotension, unspecified 07/15/2013  . Hypotension 07/15/2013  . Dyspnea and respiratory abnormality 07/13/2013  . S/P MVR (mitral valve repair) 01/14/2011  . HTN (hypertension)   . GERD (gastroesophageal reflux disease)   . Osteoporosis   . CAD (coronary artery disease)   . Mitral valve prolapse with severe mitral regurgitation   . DIAPHRAGMATIC DISORDER 02/09/2010  . HYPERTENSION 02/05/2010  . OSTEOPOROSIS 02/05/2010  . CARDIAC MURMUR, SYSTOLIC [id number]  . COUGH 02/05/2010    Alexia Freestone 09/06/2015, 12:39 PM  [place], [place], [address] Phone: [phone]   Fax:  [phone]  Name: [name] MRN: [id number] Date of Birth: June 06, 1940  Allyson Sabal, PT 09/06/2015 12:39 PM

## 2015-09-07 ENCOUNTER — Ambulatory Visit: Payer: Medicare Other | Admitting: Physical Therapy

## 2015-09-07 ENCOUNTER — Encounter: Payer: Self-pay | Admitting: Physical Therapy

## 2015-09-07 DIAGNOSIS — I890 Lymphedema, not elsewhere classified: Secondary | ICD-10-CM

## 2015-09-07 DIAGNOSIS — M6281 Muscle weakness (generalized): Secondary | ICD-10-CM

## 2015-09-07 NOTE — Therapy (Signed)
[place], [place], [address] Phone: [phone]   Fax:  [phone]  Physical Therapy Treatment  Patient Details  Name: [name] MRN: [id number] Date of Birth: Jul 21, 1940 No Data Recorded  Encounter Date: 09/07/2015      PT End of Session - 09/07/15 1102    Visit Number 2   Number of Visits 10   Date for PT Re-Evaluation 09/25/15   PT Start Time 0930   PT Stop Time 1030   PT Time Calculation (min) 60 min   Activity Tolerance Patient tolerated treatment well   Behavior During Therapy [facility] for tasks assessed/performed      Past Medical History  Diagnosis Date  . HTN (hypertension)   . GERD (gastroesophageal reflux disease)   . Osteoporosis   . CAD (coronary artery disease)   . Mitral valve prolapse   . TIA (transient ischemic attack)   . UTI (lower urinary tract infection)   . Asthma   . Complication of anesthesia     CO2 high according to patient   . Cancer [facility]) 2016    prostate    Past Surgical History  Procedure Laterality Date  . Appendectomy    . Coronary angioplasty with stent placement  04/30/2010    PCI and stenting of proximal RCA  . Mitral valve repair  06/13/2010    right mini thoracotomy for complex valvuloplasty with 82mm Memo ring annuloplasty - Dr. Roxy Manns  . Prostatectomy  2016  . Nailbed repair Right 05/02/2015    Procedure: BIOPSY NAIL BED RIGHT THUMB;  Surgeon: Daryll Brod, MD;  Location: [place];  Service: Orthopedics;  Laterality: Right;  ANESTHESIA:  IV REGIONAL FAB    There were no vitals filed for this visit.      Subjective Assessment - 09/07/15 0939    Subjective I went to [facility] but then I went to [facility] and they said they can get me a compression tank, sleeve and glove.  Patient reports no change since yesterday.   Patient is accompained by: Family member  wife   Pertinent History pt with history of melanoma  under right thumb - underwent thumb amputation and had a lymph node biopsy with removal of 6 lymph nodes - all were negative - pt now with truncal edema   Patient Stated Goals to get swelling under control, to improve strength of RUE   Currently in Pain? Yes   Pain Score 2    Pain Location Chest   Pain Orientation Right   Pain Descriptors / Indicators Heaviness               LYMPHEDEMA/ONCOLOGY QUESTIONNAIRE - 09/06/15 1046    Type   Cancer Type melanoma   Surgeries   Other Surgery Date 06/19/15   Number Lymph Nodes Removed 6   Date Lymphedema/Swelling Started   Date 06/19/15   Treatment   Active Chemotherapy Treatment No   Past Chemotherapy Treatment No   Active Radiation Treatment No   Past Radiation Treatment No   What other symptoms do you have   Are you Having Heaviness or Tightness Yes   Are you having Pain Yes   Are you having pitting edema No   Is it Hard or Difficult finding clothes that fit No   Do you have infections No   Is there Decreased scar mobility No   Lymphedema Assessments   Lymphedema Assessments Upper extremities   Right Upper  Extremity Lymphedema   15 cm Proximal to Olecranon Process 26.9 cm   Olecranon Process 24.5 cm   15 cm Proximal to Ulnar Styloid Process 20.8 cm   Just Proximal to Ulnar Styloid Process 15.7 cm   Across Hand at [employer] 19.7 cm   At [place] of 2nd Digit 6.5 cm   Left Upper Extremity Lymphedema   15 cm Proximal to Olecranon Process 26.3 cm   Olecranon Process 24.7 cm   15 cm Proximal to Ulnar Styloid Process 21.5 cm   Just Proximal to Ulnar Styloid Process 15.4 cm   Across Hand at [employer] 20.2 cm   At [place] of 2nd Digit 6.7 cm                  [facility] Adult PT Treatment/Exercise - 09/07/15 0001    Manual Therapy   Manual Therapy Manual Lymphatic Drainage (MLD)   Manual Lymphatic Drainage (MLD) In supine: short neck, left axillary and right inguinal nodes; anterior inter-axillary and right  axillo-inguinal pathways; right chest and right UE redirecting along pathways.  Also in left S/L; posterior inter-axillary and right axillo-inguinal pathways staying more posterior                PT Education - 09/07/15 1101    Education provided Yes   Education Details Instructed pt to go see Tye [place] at [facility] to be fitted for a Class I compression sleeve and gauntlet and also a compression tank.  Scheduled appointment for him to see her tomorrow at 11:00.   Person(s) Educated Patient;Spouse   Methods Explanation;Handout  Gave pt copy of MD order for garments   Comprehension Verbalized understanding                [facility] Goals - 09/06/15 1232    CC Long Term Goal  #1   Title Pt to be independent in self manual lymphatic drainage for long term management of edema   Time 3   Period Weeks   Status New   CC Long Term Goal  #2   Title Pt to obtain appropriate compression garments from DME supplier   Time 3   Period Weeks   Status New   CC Long Term Goal  #3   Title Pt to be independent in home exercise program for strengthening of RUE   Time 3   Period Weeks   Status New   CC Long Term Goal  #4   Title Pt to report a 50% improvement in truncal edema for improved comfort   Time 3   Period Weeks   Status New            Plan - 09/07/15 1102    Clinical Impression Statement Pt tolerated all manual lymph drainage well today without complaints of pain.  His right chest swelling responded well to treatment and appeared softer after it was completed.  He had many questions about compression garments but we were able to clear that up adn help him get scheduled to see someone tomorrow to be fitted for garments.   Rehab Potential Excellent   Clinical Impairments Affecting Rehab Potential pt leaving country in two weeks   PT Frequency 3x / week   PT Duration 3 weeks   PT Treatment/Interventions Manual lymph drainage;Manual techniques;Therapeutic  exercise;Taping;ADLs/Self Care Home Management;Patient/family education;DME Instruction;Compression bandaging   PT Next Visit Plan Continue manual lymph drainage to right trunk and chest - teach self MLD give  handout, assess UE strength    Consulted and Agree with Plan of Care Patient;Family member/caregiver   Family Member Consulted wife      Patient will benefit from skilled therapeutic intervention in order to improve the following deficits and impairments:  Decreased strength, Increased edema, Decreased knowledge of use of DME  Visit Diagnosis: Muscle weakness (generalized)  Lymphedema, not elsewhere classified       G-Codes - 15-Sep-2015 1237    Functional Assessment Tool Used LLIS   Functional Limitation Other PT primary   Other PT Primary Current Status [id number]) At least 40 percent but less than 60 percent impaired, limited or restricted   Other PT Primary Goal Status [id number]) At least 20 percent but less than 40 percent impaired, limited or restricted      Problem List Patient Active Problem List   Diagnosis Date Noted  . Asthma, moderate persistent, well-controlled 08/08/2014  . Dyspnea 01/27/2014  . Asthma, moderate persistent, poorly-controlled 01/27/2014  . Hypotension, unspecified 07/15/2013  . Hypotension 07/15/2013  . Dyspnea and respiratory abnormality 07/13/2013  . S/P MVR (mitral valve repair) 01/14/2011  . HTN (hypertension)   . GERD (gastroesophageal reflux disease)   . Osteoporosis   . CAD (coronary artery disease)   . Mitral valve prolapse with severe mitral regurgitation   . DIAPHRAGMATIC DISORDER 02/09/2010  . HYPERTENSION 02/05/2010  . OSTEOPOROSIS 02/05/2010  . CARDIAC MURMUR, SYSTOLIC [id number]  . COUGH 02/05/2010   Annia Friendly, PT 09/07/2015 11:07 AM  [place], [place], [address] Phone: [phone]   Fax:  [phone]  Name: [name] MRN:  [id number] Date of Birth: 07-Oct-1940

## 2015-09-12 ENCOUNTER — Ambulatory Visit: Payer: Medicare Other

## 2015-09-12 DIAGNOSIS — M6281 Muscle weakness (generalized): Secondary | ICD-10-CM

## 2015-09-12 DIAGNOSIS — I890 Lymphedema, not elsewhere classified: Secondary | ICD-10-CM | POA: Diagnosis not present

## 2015-09-12 NOTE — Patient Instructions (Signed)
Circles above collar bones near neck    Cancer Rehab [phone] Deep Effective Breath   Standing, sitting, or laying down, place both hands on the belly. Take a deep breath IN, expanding the belly; then breath OUT, contracting the belly. Repeat __5__ times. Do __2-3__ sessions per day and before your self massage.  http://gt2.exer.us/866   Copyright  VHI. All rights reserved.  Axilla to Axilla - Sweep   On uninvolved side make 5 circles in the armpit, then pump _5__ times from involved armpit across chest to uninvolved armpit, making a pathway. Do _1__ time per day.  Copyright  VHI. All rights reserved.  Axilla to Inguinal Nodes - Sweep   On involved side, make 5 circles at groin at panty line, then pump _5__ times from armpit along side of trunk to outer hip, making your other pathway. Do __1_ time per day.  Copyright  VHI. All rights reserved.  Arm Posterior: Elbow to Shoulder - Sweep   Pump _5__ times from back of elbow to top of shoulder. Then inner to outer upper arm _5_ times, then outer arm again _5_ times. Then back to the pathways _2-3_ times. Do _1__ time per day.  Copyright  VHI. All rights reserved.  ARM: [place] Wrist to Elbow - Sweep   Pump or stationary circles _5__ times from wrist to elbow making sure to do both sides of the forearm. Then retrace your steps to the outer arm, and the pathways _2-3_ times each. Do _1__ time per day.  Copyright  VHI. All rights reserved.  ARM: Dorsum of Hand to Shoulder - Sweep   Pump or stationary circles _5__ times on back of hand including knuckle spaces and individual fingers if needed working up towards the wrist, then retrace all your steps working back up the forearm, doing both sides; upper outer arm and back to your pathways _2-3_ times each. Then do 5 circles again at uninvolved armpit and involved groin where you started! Good job!! Do __1_ time per day.  Copyright  VHI. All rights reserved.

## 2015-09-12 NOTE — Therapy (Signed)
[place], [place], [address] Phone: [phone]   Fax:  [phone]  Physical Therapy Treatment  Patient Details  Name: [name] MRN: [id number] Date of Birth: [birth year] No Data Recorded  Encounter Date: 09/12/2015      PT End of Session - 09/12/15 1241    Visit Number 3   Number of Visits 10   Date for PT Re-Evaluation 09/25/15   PT Start Time [id number]   PT Stop Time 1239   PT Time Calculation (min) 50 min   Activity Tolerance Patient tolerated treatment well   Behavior During Therapy [name] for tasks assessed/performed      Past Medical History  Diagnosis Date  . HTN (hypertension)   . GERD (gastroesophageal reflux disease)   . Osteoporosis   . CAD (coronary artery disease)   . Mitral valve prolapse   . TIA (transient ischemic attack)   . UTI (lower urinary tract infection)   . Asthma   . Complication of anesthesia     CO2 high according to patient   . Cancer [facility]) 2016    prostate    Past Surgical History  Procedure Laterality Date  . Appendectomy    . Coronary angioplasty with stent placement  04/30/2010    PCI and stenting of proximal RCA  . Mitral valve repair  06/13/2010    right mini thoracotomy for complex valvuloplasty with 68mm Memo ring annuloplasty - Dr. Roxy Manns  . Prostatectomy  2016  . Nailbed repair Right 05/02/2015    Procedure: BIOPSY NAIL BED RIGHT THUMB;  Surgeon: Daryll Brod, MD;  Location: [facility] [place];  Service: Orthopedics;  Laterality: Right;  ANESTHESIA:  IV REGIONAL FAB    There were no vitals filed for this visit.      Subjective Assessment - 09/12/15 1200    Subjective I got my tank top from [facility] [place] and I found one at [facility] and I want you to look at them.    Pertinent History pt with history of melanoma under right thumb - underwent thumb amputation and had a lymph node biopsy with removal of 6 lymph nodes - all were negative  - pt now with truncal edema   Patient Stated Goals to get swelling under control, to improve strength of RUE   Currently in Pain? No/denies                         [name] Adult PT Treatment/Exercise - 09/12/15 0001    Manual Therapy   Manual Therapy Manual Lymphatic Drainage (MLD)   Manual Lymphatic Drainage (MLD) In supine: short neck, left axillary and right inguinal nodes; anterior inter-axillary and right axillo-inguinal pathways; right chest and right UE redirecting along pathways.  Also in left S/L; posterior inter-axillary and right axillo-inguinal pathways staying more posterior                PT Education - 09/12/15 1236    Education provided Yes   Education Details Self manual lymph drainage   Person(s) Educated Patient;Spouse   Methods Explanation;Demonstration;Handout   Comprehension Verbalized understanding;Returned demonstration;Need further instruction                [facility] Goals - 09/06/15 1232    CC Long Term Goal  #1   Title Pt to be independent in self manual lymphatic drainage for long term management of edema   Time 3  Period Weeks   Status New   CC Long Term Goal  #2   Title Pt to obtain appropriate compression garments from DME supplier   Time 3   Period Weeks   Status New   CC Long Term Goal  #3   Title Pt to be independent in home exercise program for strengthening of RUE   Time 3   Period Weeks   Status New   CC Long Term Goal  #4   Title Pt to report a 50% improvement in truncal edema for improved comfort   Time 3   Period Weeks   Status New            Plan - 09/12/15 1242    Clinical Impression Statement Pt and wife were instructed in self manual lymph drainage today and basic principles of this. They were engaged in learning and asking appriopiate questions throuhgout. The compression shirt pt got from [facility] didn't fit into his axilla as well as the shirt from [facility] [place] so he is going  to take this back and see if he can find a more cost efficient option that fits better, if not he'll get the one from [facility] [place].    Rehab Potential Excellent   Clinical Impairments Affecting Rehab Potential pt leaving country in two weeks   PT Frequency 3x / week   PT Duration 3 weeks   PT Treatment/Interventions Manual lymph drainage;Manual techniques;Therapeutic exercise;Taping;ADLs/Self Care Home Management;Patient/family education;DME Instruction;Compression bandaging   PT Next Visit Plan Continue manual lymph drainage to right trunk and chest - review self MLD, assess UE strength, and see if pt found a new compression shirt/assess this for him.    Consulted and Agree with Plan of Care Patient;Family member/caregiver      Patient will benefit from skilled therapeutic intervention in order to improve the following deficits and impairments:  Decreased strength, Increased edema, Decreased knowledge of use of DME  Visit Diagnosis: Muscle weakness (generalized)  Lymphedema, not elsewhere classified     Problem List Patient Active Problem List   Diagnosis Date Noted  . Asthma, moderate persistent, well-controlled 08/08/2014  . Dyspnea 01/27/2014  . Asthma, moderate persistent, poorly-controlled 01/27/2014  . Hypotension, unspecified 07/15/2013  . Hypotension 07/15/2013  . Dyspnea and respiratory abnormality 07/13/2013  . S/P MVR (mitral valve repair) 01/14/2011  . HTN (hypertension)   . GERD (gastroesophageal reflux disease)   . Osteoporosis   . CAD (coronary artery disease)   . Mitral valve prolapse with severe mitral regurgitation   . DIAPHRAGMATIC DISORDER 02/09/2010  . HYPERTENSION 02/05/2010  . OSTEOPOROSIS 02/05/2010  . CARDIAC MURMUR, SYSTOLIC [id number]  . COUGH 02/05/2010    [name], [name] 09/12/2015, 12:52 PM  [place], [place], [address] Phone: [phone]    Fax:  [phone]  Name: [name] MRN: [id number] Date of Birth: 1940/05/02

## 2015-09-14 ENCOUNTER — Ambulatory Visit: Payer: Medicare Other | Attending: Internal Medicine

## 2015-09-14 DIAGNOSIS — I890 Lymphedema, not elsewhere classified: Secondary | ICD-10-CM | POA: Diagnosis not present

## 2015-09-14 DIAGNOSIS — M6281 Muscle weakness (generalized): Secondary | ICD-10-CM | POA: Diagnosis not present

## 2015-09-14 NOTE — Patient Instructions (Addendum)
Self manual lymph drainage:         Cancer Rehab [phone] Perform this sequence once a day.  Only give enough pressure no your skin to make the skin move.  Start with circles near neck above collar bones. Repeat 5 times.  Diaphragmatic - Supine   Interlace fingers over navel. Inhale through nose making navel move out toward hands. Exhale through puckered lips, hands follow navel in. Repeat _5__ times. Rest _10__ seconds between repeats.   Axilla - One at a Time   Using full weight of flat hand and fingers at center of uninvolved armpit (where you put your deodorant on), make _10__ in-place circles.   Copyright  VHI. All rights reserved.  LEG: Inguinal Nodes Stimulation   With small finger side of hand against hip crease on involved side, gently perform circles at the crease. Repeat __10_ times.   Copyright  VHI. All rights reserved.  1) Axilla to Inguinal Nodes - Sweep   On involved side, pump _4__ times from armpit along side of trunk to hip crease. Use side of hand to push down towards hip.  Now gently stretch skin from the involved side to the uninvolved side across the chest at the shoulder line pushing thru palm of hand.  Repeat that 4 times.  Draw an imaginary diagonal line from upper outer breast through the nipple area toward lower inner breast.  Direct fluid upward and inward from this line toward the pathway across your upper chest .  Do this in three rows to treat all of the upper inner breast tissue, and do each row 3-4x.      Direct fluid to treat all of lower outer breast tissue downward and outward toward pathway that is aimed at the left groin.  Finish by doing the pathways as described above going from your involved armpit to the same side groin and going across your upper chest from the involved shoulder to the uninvolved shoulder.  Repeat the steps above where you do circles in your right groin and left armpit. Copyright  VHI. All rights reserved.

## 2015-09-14 NOTE — Therapy (Signed)
[place], [place], [address] Phone: [phone]   Fax:  [phone]  Physical Therapy Treatment  Patient Details  Name: [name] MRN: [id number] Date of Birth: 1940/06/15 No Data Recorded  Encounter Date: 09/14/2015      PT End of Session - 09/14/15 0906    Visit Number 4   Number of Visits 9   Date for PT Re-Evaluation 09/25/15   PT Start Time 0802   PT Stop Time 0093   PT Time Calculation (min) 53 min   Activity Tolerance Patient tolerated treatment well   Behavior During Therapy [facility] for tasks assessed/performed      Past Medical History  Diagnosis Date  . HTN (hypertension)   . GERD (gastroesophageal reflux disease)   . Osteoporosis   . CAD (coronary artery disease)   . Mitral valve prolapse   . TIA (transient ischemic attack)   . UTI (lower urinary tract infection)   . Asthma   . Complication of anesthesia     CO2 high according to patient   . Cancer [facility]) 2016    prostate    Past Surgical History  Procedure Laterality Date  . Appendectomy    . Coronary angioplasty with stent placement  04/30/2010    PCI and stenting of proximal RCA  . Mitral valve repair  06/13/2010    right mini thoracotomy for complex valvuloplasty with 30m Memo ring annuloplasty - Dr. ORoxy Manns . Prostatectomy  2016  . Nailbed repair Right 05/02/2015    Procedure: BIOPSY NAIL BED RIGHT THUMB;  Surgeon: GDaryll Brod MD;  [place] [facility]  Service: Orthopedics;  Laterality: Right;  ANESTHESIA:  IV REGIONAL FAB    There were no vitals filed for this visit.      Subjective Assessment - 09/14/15 0816    Subjective I found a different shirt I want you to see. I got a short sleeve compression shirt so now the whole axilla is covered.   Pertinent History pt with history of melanoma under right thumb - underwent thumb amputation and had a lymph node biopsy with removal of 6 lymph nodes -  all were negative - pt now with truncal edema   Patient Stated Goals to get swelling under control, to improve strength of RUE   Currently in Pain? No/denies                         [facility] - 09[facility]15 0001    Manual Therapy   Manual Therapy Manual Lymphatic Drainage (MLD)   Manual Lymphatic Drainage (MLD) In supine: short neck, 5 diaphragmatic breaths, left axillary and right inguinal nodes; anterior inter-axillary and right axillo-inguinal pathways focusing on lateral breast tissue redirecting along pathways. Reviewed with pt throughout today answering his questions throughout and then towards end of session had pt perform in front of mirror with therapist giving VC for correct sequence as review.                 PT Education - 09/14/15 0857    Education provided Yes   Education Details Self manual lymph drainage reviewed with pt and reissued handout as he requested another copy.    Person(s) Educated Patient   Methods Explanation;Demonstration;Tactile cues;Verbal cues;Handout   Comprehension Verbalized understanding;Returned demonstration;Verbal cues required;Tactile cues required;Need further instruction                Long Term  Clinic Goals - 09/14/15 0916    CC Long Term Goal  #1   Title Pt to be independent in self manual lymphatic drainage for long term management of edema   Status Partially Met   CC Long Term Goal  #2   Title Pt to obtain appropriate compression garments from DME supplier  Pt found a compression workout short sleeved shirt and will be getting a class I compression sleeve   Status Achieved   CC Long Term Goal  #3   Title Pt to be independent in home exercise program for strengthening of RUE   Status On-going   CC Long Term Goal  #4   Title Pt to report a 50% improvement in truncal edema for improved comfort   Status On-going            Plan - 09/14/15 0911    Clinical Impression Statement Pt  brought other compression type shirt that he had gotten from [facility] since last visit and this one was short sleeve so provided better coverage through his lateral trunk and he was able to fit into xsmall which was snug but not uncomfortable and sleeves weren't too tight. Pt is also going to get a compression sleeve to wear on his upcoming flight. Reviewed with pt manual lymph drainage today beginning in supine with therapist demonstrating and then pt returning demo at times with therapist hand atop pts for correct direction of stretch and pressure. Then finished review standing in front of mirror so pt could also visually see correct hand placements and this was very beneficial. Reissued handout as he reported misplaced one from last visit. Pt is making good progress and was fairly confident with technique of MLD upon leaving today.    Rehab Potential Excellent   Clinical Impairments Affecting Rehab Potential pt leaving country in two weeks   PT Frequency 3x / week   PT Duration 3 weeks   PT Treatment/Interventions Manual lymph drainage;Manual techniques;Therapeutic exercise;Taping;ADLs/Self Care Home Management;Patient/family education;DME Instruction;Compression bandaging   PT Next Visit Plan Continue to review manual lymph drainage to right trunk and chest - assess UE strength/issue HEP for UE strength (possibly supine scapular series)   Consulted and Agree with Plan of Care Patient      Patient will benefit from skilled therapeutic intervention in order to improve the following deficits and impairments:  Decreased strength, Increased edema, Decreased knowledge of use of DME  Visit Diagnosis: Muscle weakness (generalized)  Lymphedema, not elsewhere classified     Problem List Patient Active Problem List   Diagnosis Date Noted  . Asthma, moderate persistent, well-controlled 08/08/2014  . Dyspnea 01/27/2014  . Asthma, moderate persistent, poorly-controlled 01/27/2014  . Hypotension,  unspecified 07/15/2013  . Hypotension 07/15/2013  . Dyspnea and respiratory abnormality 07/13/2013  . S/P MVR (mitral valve repair) 01/14/2011  . HTN (hypertension)   . GERD (gastroesophageal reflux disease)   . Osteoporosis   . CAD (coronary artery disease)   . Mitral valve prolapse with severe mitral regurgitation   . DIAPHRAGMATIC DISORDER 02/09/2010  . HYPERTENSION 02/05/2010  . OSTEOPOROSIS 02/05/2010  . CARDIAC MURMUR, SYSTOLIC 11/94/1740  . COUGH 02/05/2010    Otelia Limes, PTA 09/14/2015, 9:17 AM  [place], [place], [address] Phone: [phone]   Fax:  [phone]  Name: [name] MRN: [id number] Date of Birth: 12-13-1940

## 2015-09-18 ENCOUNTER — Ambulatory Visit: Payer: Medicare Other

## 2015-09-18 DIAGNOSIS — I890 Lymphedema, not elsewhere classified: Secondary | ICD-10-CM

## 2015-09-18 DIAGNOSIS — M6281 Muscle weakness (generalized): Secondary | ICD-10-CM | POA: Diagnosis not present

## 2015-09-18 NOTE — Therapy (Signed)
[place], [place], [address] Phone: [phone]   Fax:  [phone]  Physical Therapy Treatment  Patient Details  Name: [name] MRN: [id number] Date of Birth: 1941/02/10 No Data Recorded  Encounter Date: 09/18/2015      PT End of Session - 09/18/15 0814    Visit Number 5   Number of Visits 9   Date for PT Re-Evaluation 09/25/15   PT Start Time 0850   PT Stop Time 0934   PT Time Calculation (min) 44 min   Activity Tolerance Patient tolerated treatment well   Behavior During Therapy [facility] for tasks assessed/performed      Past Medical History  Diagnosis Date  . HTN (hypertension)   . GERD (gastroesophageal reflux disease)   . Osteoporosis   . CAD (coronary artery disease)   . Mitral valve prolapse   . TIA (transient ischemic attack)   . UTI (lower urinary tract infection)   . Asthma   . Complication of anesthesia     CO2 high according to patient   . Cancer [facility]) 2016    prostate    Past Surgical History  Procedure Laterality Date  . Appendectomy    . Coronary angioplasty with stent placement  04/30/2010    PCI and stenting of proximal RCA  . Mitral valve repair  06/13/2010    right mini thoracotomy for complex valvuloplasty with 70m Memo ring annuloplasty - Dr. ORoxy Manns . Prostatectomy  2016  . Nailbed repair Right 05/02/2015    Procedure: BIOPSY NAIL BED RIGHT THUMB;  Surgeon: GDaryll Brod MD;  [place] [facility]  Service: Orthopedics;  Laterality: Right;  ANESTHESIA:  IV REGIONAL FAB    There were no vitals filed for this visit.      Subjective Assessment - 09/18/15 0903    Subjective I still haven't settled on buying something yet. I keep looking at too many things. I found a strapless (maternity belly cover) thing that was my daughters so I wore it yesterday over a white Tshirt.    Pertinent History pt with history of melanoma under right thumb -  underwent thumb amputation and had a lymph node biopsy with removal of 6 lymph nodes - all were negative - pt now with truncal edema   Patient Stated Goals to get swelling under control, to improve strength of RUE   Currently in Pain? No/denies                                 PT Education - 09/18/15 0932    Education provided Yes   Education Details Reprinted self manual lymph drainage handout with UE and breast portion all inclusive, and added supine scapular sereies today as well   Person(s) Educated Patient   Methods Explanation;Demonstration;Handout;Verbal cues   Comprehension Verbalized understanding;Returned demonstration;Tactile cues required;Need further instruction                [facility] - 09[facility]15 0905    CC Long Term Goal  #1   Title Pt to be independent in self manual lymphatic drainage for long term management of edema  Pt did well with review today only requiring minor VC for no sliding on skin(which he was doing less than last session), only skin stretch.   Status Partially Met   CC Long Term Goal  #2   Title Pt to obtain  appropriate compression garments from DME supplier  Pt still working on getting compression sleeve and possibly vest.   Status Partially Met   CC Long Term Goal  #3   Title Pt to be independent in home exercise program for strengthening of RUE  Issued postural strength today.   Status Partially Met   CC Long Term Goal  #4   Title Pt to report a 50% improvement in truncal edema for improved comfort  20-30% improvement reported at this time.    Status On-going            Plan - 09/18/15 0937    Clinical Impression Statement Pt had lost the prescription Leone Payor, PT had given him about a week and a half ago for his 09/08/15 appt at [facility] for compression vest and sleeve. Spent today reminding pt (per his questions) thta we still think compression vest and sleeve (class I) is best option  for him. He verbalized understanding this and then we reviewed self manual lymph drainage (issued a third handout with UE and breast sequences all inclusive) and threw away his first 2 so he wouldn't be confused. Also progressed HEP to include supine scapular series for postural stregnthening which pt did well with only requiring minor VC for correct technique.    Rehab Potential Excellent   Clinical Impairments Affecting Rehab Potential pt leaving country June 10   PT Frequency 3x / week   PT Duration 3 weeks   PT Treatment/Interventions Manual lymph drainage;Manual techniques;Therapeutic exercise;Taping;ADLs/Self Care Home Management;Patient/family education;DME Instruction;Compression bandaging   PT Next Visit Plan Continue to review manual lymph drainage to right trunk and chest - assess UE strength and review HEP issued today   PT Home Exercise Plan Cont self manual lymph drainage and begin supine scapular series (red theraband)   Consulted and Agree with Plan of Care Patient      Patient will benefit from skilled therapeutic intervention in order to improve the following deficits and impairments:  Decreased strength, Increased edema, Decreased knowledge of use of DME  Visit Diagnosis: Muscle weakness (generalized)  Lymphedema, not elsewhere classified     Problem List Patient Active Problem List   Diagnosis Date Noted  . Asthma, moderate persistent, well-controlled 08/08/2014  . Dyspnea 01/27/2014  . Asthma, moderate persistent, poorly-controlled 01/27/2014  . Hypotension, unspecified 07/15/2013  . Hypotension 07/15/2013  . Dyspnea and respiratory abnormality 07/13/2013  . S/P MVR (mitral valve repair) 01/14/2011  . HTN (hypertension)   . GERD (gastroesophageal reflux disease)   . Osteoporosis   . CAD (coronary artery disease)   . Mitral valve prolapse with severe mitral regurgitation   . DIAPHRAGMATIC DISORDER 02/09/2010  . HYPERTENSION 02/05/2010  . OSTEOPOROSIS  02/05/2010  . CARDIAC MURMUR, SYSTOLIC 93/73/4287  . COUGH 02/05/2010    Otelia Limes, PTA 09/18/2015, 9:46 AM  [place], [place], [address] Phone: [phone]   Fax:  [phone]  Name: [name] MRN: [id number] Date of Birth: 1941-04-09

## 2015-09-18 NOTE — Patient Instructions (Addendum)
Start by hugging yourself with circles at the neck above the collarbones. 5 times Deep Effective Breath   Standing, sitting, or laying down, place both hands on the belly. Take a deep breath IN, expanding the belly; then breath OUT, contracting the belly. Repeat __5__ times. Do __2-3__ sessions per day and before your self massage.  http://gt2.exer.us/866   Copyright  VHI. All rights reserved.  Axilla to Axilla - Sweep   On Left side make 5 circles in the armpit (where you put your deodorant on), then pump _5__ times from involved armpit across chest (above nipple line) to uninvolved armpit, making a pathway. Do _1__ time per day.  Copyright  VHI. All rights reserved.  Axilla to Inguinal Nodes - Sweep   On involved side, make 5 circles at groin at panty line, then pump _5__ times from armpit along side of trunk to outer hip, making your other pathway. Do __1_ time per day.  THIS IS THE CHEST PORTION. CAN STOP AFTER WORKING ON CHEST OR CONTINUE ON TO ARM. Draw an imaginary diagonal line from upper outer breast through the nipple area toward lower inner breast.  Direct fluid upward and inward from this line toward the pathway across your upper chest .  Do this in three rows to treat all of the upper inner breast tissue, and do each row 3-4x.      Direct fluid to treat all of lower outer breast tissue downward and outward toward pathway that is aimed at the left groin.  Finish by doing the pathways as described above going from your involved armpit to the same side groin and going across your upper chest from the involved shoulder to the uninvolved shoulder.   Repeat the steps above where you do circles in your left groin and right armpit. Copyright  VHI. All rights reserved.  Copyright  VHI. All rights reserved.  Arm Posterior: Elbow to Shoulder - Sweep   Pump _5__ times from back of elbow to top of shoulder. Then inner to outer upper arm _5_ times, then outer arm again _5_  times. Then back to the pathways _2-3_ times. Do _1__ time per day.  Copyright  VHI. All rights reserved.  ARM: [place]olar Wrist to Elbow - Sweep   Pump or stationary circles _5__ times from wrist to elbow making sure to do both sides of the forearm. Then retrace your steps to the outer arm, and the pathways _2-3_ times each. Do _1__ time per day.  Copyright  VHI. All rights reserved.  ARM: Dorsum of Hand to Shoulder - Sweep   Pump or stationary circles _5__ times on back of hand including knuckle spaces and individual fingers if needed working up towards the wrist, then retrace all your steps working back up the forearm, doing both sides; upper outer arm and back to your pathways _2-3_ times each. Then do 5 circles again at uninvolved armpit and involved groin where you started! Good job!! Do __1_ time per day.  Copyright  VHI. All rights reserved   Over Head Pull: Narrow Grip And Wide Grip       On back, knees bent, feet flat, band across thighs, elbows straight but relaxed. Pull hands apart (start). Keeping elbows straight, bring arms up and over head, hands toward floor. Keep pull steady on band. Hold momentarily. Return slowly, keeping pull steady, back to start. Then do again with a wider grip. Repeat _10__ times. Band color _red_____   Side Pull: Double Arm  On back, knees bent, feet flat. Arms perpendicular to body, shoulder level, elbows straight but relaxed. Pull arms out to sides, elbows straight. Resistance band comes across collarbones, hands toward floor. Hold momentarily. Slowly return to starting position. Repeat _10__ times. Band color __red___   Sword   On back, knees bent, feet flat, left hand on left hip, right hand above left. Pull right arm DIAGONALLY (hip to shoulder) across chest. Bring right arm along head toward floor. Hold momentarily. Slowly return to starting position. Repeat _10__ times. Do with left arm. Band color _red_____   Shoulder Rotation:  Double Arm   On back, knees bent, feet flat, elbows tucked at sides, bent 90, hands palms up. Pull hands apart and down toward floor, keeping elbows near sides. Hold momentarily. Slowly return to starting position. Repeat _10__ times. Band color __red____   Cancer Rehab   [phone]

## 2015-09-20 ENCOUNTER — Ambulatory Visit: Payer: Medicare Other

## 2015-09-20 DIAGNOSIS — I890 Lymphedema, not elsewhere classified: Secondary | ICD-10-CM | POA: Diagnosis not present

## 2015-09-20 DIAGNOSIS — M6281 Muscle weakness (generalized): Secondary | ICD-10-CM | POA: Diagnosis not present

## 2015-09-20 NOTE — Therapy (Signed)
[place], [place], [address] Phone: [phone]   Fax:  [phone]  Physical Therapy Treatment  Patient Details  Name: [name] MRN: [id number] Date of Birth: 07/09/1940 No Data Recorded  Encounter Date: 09/20/2015      PT End of Session - 09/20/15 1019    Visit Number 6   Number of Visits 9   Date for PT Re-Evaluation 09/25/15   PT Start Time 0939  Pt running late   PT Stop Time 1022   PT Time Calculation (min) 43 min   Activity Tolerance Patient tolerated treatment well   Behavior During Therapy [facility] for tasks assessed/performed      Past Medical History  Diagnosis Date  . HTN (hypertension)   . GERD (gastroesophageal reflux disease)   . Osteoporosis   . CAD (coronary artery disease)   . Mitral valve prolapse   . TIA (transient ischemic attack)   . UTI (lower urinary tract infection)   . Asthma   . Complication of anesthesia     CO2 high according to patient   . Cancer [facility]) 2016    prostate    Past Surgical History  Procedure Laterality Date  . Appendectomy    . Coronary angioplasty with stent placement  04/30/2010    PCI and stenting of proximal RCA  . Mitral valve repair  06/13/2010    right mini thoracotomy for complex valvuloplasty with 30m Memo ring annuloplasty - Dr. ORoxy Manns . Prostatectomy  2016  . Nailbed repair Right 05/02/2015    Procedure: BIOPSY NAIL BED RIGHT THUMB;  Surgeon: GDaryll Brod MD;  [place]  Service: Orthopedics;  Laterality: Right;  ANESTHESIA:  IV REGIONAL FAB    There were no vitals filed for this visit.                       [facility] PT Treatment/Exercise - 09/20/15 0001    Manual Therapy   Manual Therapy Manual Lymphatic Drainage (MLD)   Manual Lymphatic Drainage (MLD) In standing: Pt performed today with therapist providing feedback and tactile cuing throughout: short neck, 5 diaphragmatic  breaths, left axillary and right inguinal nodes; anterior inter-axillary and right axillo-inguinal pathways focusing on lateral breast tissue redirecting along pathways. Reviewed with pt throughout today answering his questions throughout and then towards end of session had pt perform in front of mirror with therapist giving VC for correct sequence as review.                         [facility]Goals - 09/18/15 0905    CC Long Term Goal  #1   Title Pt to be independent in self manual lymphatic drainage for long term management of edema  Pt did well with review today only requiring minor VC for no sliding on skin(which he was doing less than last session), only skin stretch.   Status Partially Met   CC Long Term Goal  #2   Title Pt to obtain appropriate compression garments from DME supplier  Pt still working on getting compression sleeve and possibly vest.   Status Partially Met   CC Long Term Goal  #3   Title Pt to be independent in home exercise program for strengthening of RUE  Issued postural strength today.   Status Partially Met   CC Long Term Goal  #4   Title Pt to  report a 50% improvement in truncal edema for improved comfort  20-30% improvement reported at this time.    Status On-going            Plan - 09/20/15 1028    Clinical Impression Statement Pt has finally decided on compression garments bringing a class I Juzo sleeve today which fit him very well. And had a med and large compression vest/shirt, the large fit him best and he plans to return the med. He did very well with review of self manual lymph drainage today mostly requiring cuing just for lighter pressure. He demonstrated a goon understanding of sequence.    Rehab Potential Excellent   Clinical Impairments Affecting Rehab Potential pt leaving country June 10   PT Frequency 3x / week   PT Duration 3 weeks   PT Treatment/Interventions Manual lymph drainage;Manual techniques;Therapeutic  exercise;Taping;ADLs/Self Care Home Management;Patient/family education;DME Instruction;Compression bandaging   PT Next Visit Plan Briefly review manual lymph drainage to right trunk and chest - assess UE strength and review spend more time reviewing supine scapular series and issue possibly [place] if needed. Plan to D/C next visit as pt will be going out of countrty.     Consulted and Agree with Plan of Care Patient      Patient will benefit from skilled therapeutic intervention in order to improve the following deficits and impairments:  Decreased strength, Increased edema, Decreased knowledge of use of DME  Visit Diagnosis: Muscle weakness (generalized)  Lymphedema, not elsewhere classified     Problem List Patient Active Problem List   Diagnosis Date Noted  . Asthma, moderate persistent, well-controlled 08/08/2014  . Dyspnea 01/27/2014  . Asthma, moderate persistent, poorly-controlled 01/27/2014  . Hypotension, unspecified 07/15/2013  . Hypotension 07/15/2013  . Dyspnea and respiratory abnormality 07/13/2013  . S/P MVR (mitral valve repair) 01/14/2011  . HTN (hypertension)   . GERD (gastroesophageal reflux disease)   . Osteoporosis   . CAD (coronary artery disease)   . Mitral valve prolapse with severe mitral regurgitation   . DIAPHRAGMATIC DISORDER 02/09/2010  . HYPERTENSION 02/05/2010  . OSTEOPOROSIS 02/05/2010  . CARDIAC MURMUR, SYSTOLIC 13/14/3888  . COUGH 02/05/2010    Otelia Limes, PTA 09/20/2015, 10:34 AM  [place], [place], [address] Phone: [phone]   Fax:  [phone]  Name: LAMONT TANT MRN: [id number] Date of Birth: [birth year]

## 2015-09-21 DIAGNOSIS — R972 Elevated prostate specific antigen [PSA]: Secondary | ICD-10-CM | POA: Diagnosis not present

## 2015-09-21 DIAGNOSIS — C61 Malignant neoplasm of prostate: Secondary | ICD-10-CM | POA: Diagnosis not present

## 2015-09-22 ENCOUNTER — Ambulatory Visit: Payer: Medicare Other | Admitting: Physical Therapy

## 2015-09-22 ENCOUNTER — Encounter: Payer: Self-pay | Admitting: Physical Therapy

## 2015-09-22 DIAGNOSIS — I890 Lymphedema, not elsewhere classified: Secondary | ICD-10-CM

## 2015-09-22 DIAGNOSIS — M6281 Muscle weakness (generalized): Secondary | ICD-10-CM

## 2015-09-22 NOTE — Patient Instructions (Signed)
Strengthening: Resisted Internal Rotation   Hold tubing in left hand, elbow at side and forearm out. Rotate forearm in across body. Repeat _10___ times per set. Do __1__ sets per session. Do _2___ sessions per day.  http://orth.exer.us/830   Copyright  VHI. All rights reserved.  Strengthening: Resisted External Rotation   Hold tubing in right hand, elbow at side and forearm across body. Rotate forearm out. Repeat __10__ times per set. Do _1___ sets per session. Do _2___ sessions per day.  http://orth.exer.us/828   Copyright  VHI. All rights reserved.  Strengthening: Resisted Flexion   Hold tubing with left arm at side. Pull forward and up. Move shoulder through pain-free range of motion. Repeat _10___ times per set. Do _1___ sets per session. Do _2___ sessions per day.  http://orth.exer.us/824   Copyright  VHI. All rights reserved.  Strengthening: Resisted Extension   Hold tubing in right hand, arm forward. Pull arm back, elbow straight. Repeat _10___ times per set. Do __1__ sets per session. Do __2__ sessions per day.  http://orth.exer.us/832   Copyright  VHI. All rights reserved.

## 2015-09-22 NOTE — Therapy (Signed)
[place], [place], [address] Phone: [phone]   Fax:  [phone]  Physical Therapy Treatment  Patient Details  Name: [name] MRN: [id number] Date of Birth: 1941/02/19 No Data Recorded  Encounter Date: 09/22/2015      PT End of Session - 09/22/15 1144    Visit Number 7   Number of Visits 9   Date for PT Re-Evaluation 09/25/15   PT Start Time 0932   PT Stop Time 1043   PT Time Calculation (min) 71 min   Activity Tolerance Patient tolerated treatment well   Behavior During Therapy [facility] for tasks assessed/performed      Past Medical History  Diagnosis Date  . HTN (hypertension)   . GERD (gastroesophageal reflux disease)   . Osteoporosis   . CAD (coronary artery disease)   . Mitral valve prolapse   . TIA (transient ischemic attack)   . UTI (lower urinary tract infection)   . Asthma   . Complication of anesthesia     CO2 high according to patient   . Cancer [facility]) 2016    prostate    Past Surgical History  Procedure Laterality Date  . Appendectomy    . Coronary angioplasty with stent placement  04/30/2010    PCI and stenting of proximal RCA  . Mitral valve repair  06/13/2010    right mini thoracotomy for complex valvuloplasty with 39m Memo ring annuloplasty - Dr. ORoxy Manns . Prostatectomy  2016  . Nailbed repair Right 05/02/2015    Procedure: BIOPSY NAIL BED RIGHT THUMB;  Surgeon: GDaryll Brod MD;  Location: [facility]  Service: Orthopedics;  Laterality: Right;  ANESTHESIA:  [facility]    There were no vitals filed for this visit.      Subjective Assessment - 09/22/15 0937    Subjective I have a few questions about how to do the massage. I have been doing the exercises.    Pertinent History pt with history of melanoma under right thumb - underwent thumb amputation and had a lymph node biopsy with removal of 6 lymph nodes - all were negative - pt now with  truncal edema   Patient Stated Goals to get swelling under control, to improve strength of RUE   Currently in Pain? No/denies   Pain Score 0-No pain                         [facility] Adult PT Treatment/Exercise - 09/22/15 0001    Shoulder Exercises: Supine   Horizontal ABduction Strengthening;Both;10 reps;Theraband   External Rotation Strengthening;10 reps;Theraband;Both   Flexion Strengthening;Both;10 reps;Theraband  red   Other Supine Exercises D2 bilaterally x 5 reps with red theraband   Shoulder Exercises: Standing   External Rotation Strengthening;Both;10 reps;Theraband  red   Internal Rotation Strengthening;Both;10 reps;Theraband  red   Flexion Strengthening;Both;10 reps;Theraband  red   Extension Strengthening;Both;10 reps;Theraband  red   Manual Therapy   Manual Therapy Manual Lymphatic Drainage (MLD)   Manual Lymphatic Drainage (MLD) In supine: Pt performed today with therapist providing feedback and verbal cuing minimally throughout: short neck, 5 diaphragmatic breaths, left axillary and right inguinal nodes; anterior inter-axillary and right axillo-inguinal pathways focusing on lateral breast tissue redirecting along pathways. Reviewed with pt throughout today answering his questions throughout session                        Long  Term Clinic Goals - 2015-10-07 1147    CC Long Term Goal  #1   Title Pt to be independent in self manual lymphatic drainage for long term management of edema   Baseline pt able to demonstrate correct technique and hand placement for self drainage   Status Achieved   CC Long Term Goal  #2   Title Pt to obtain appropriate compression garments from DME supplier   Baseline pt has received compression sleeve and vest   Status Achieved   CC Long Term Goal  #3   Title Pt to be independent in home exercise program for strengthening of RUE   Baseline pt demonstrates independence with home exericse program   Status Achieved    CC Long Term Goal  #4   Title Pt to report a 50% improvement in truncal edema for improved comfort   Baseline pt reports a 20-30% improvement at this time   Status Partially Met            Plan - 10/07/15 1144    Clinical Impression Statement Patient has now met all goals for therapy. He has received his compression sleeve and vest. He was educated to wear the vest as often as needed to control truncal edema and to wear the sleeve when flying. Pt reqired minimal cueing to perform self drainage correctly today and has been compliant with technique. He was educated on additional exercises to add to his home exercise program. He will be discharged from skilled PT services at this point because he able to manage his edema independently.    Rehab Potential Excellent   Clinical Impairments Affecting Rehab Potential pt leaving country June 10   PT Frequency 3x / week   PT Duration 3 weeks   PT Treatment/Interventions Manual lymph drainage;Manual techniques;Therapeutic exercise;Taping;ADLs/Self Care Home Management;Patient/family education;DME Instruction;Compression bandaging   PT Next Visit Plan d/c today   PT Home Exercise Plan supine scap series, rockwood exercises   Consulted and Agree with Plan of Care Patient      Patient will benefit from skilled therapeutic intervention in order to improve the following deficits and impairments:  Decreased strength, Increased edema, Decreased knowledge of use of DME  Visit Diagnosis: Muscle weakness (generalized)  Lymphedema, not elsewhere classified       G-Codes - October 07, 2015 1148    Functional Assessment Tool Used per clinical judgement   Functional Limitation Other PT primary   Other PT Primary Current Status (Z6109) At least 20 percent but less than 40 percent impaired, limited or restricted   Other PT Primary Goal Status (U0454) At least 20 percent but less than 40 percent impaired, limited or restricted   Other PT Primary Discharge  Status (U9811) At least 20 percent but less than 40 percent impaired, limited or restricted      Problem List Patient Active Problem List   Diagnosis Date Noted  . Asthma, moderate persistent, well-controlled 08/08/2014  . Dyspnea 01/27/2014  . Asthma, moderate persistent, poorly-controlled 01/27/2014  . Hypotension, unspecified 07/15/2013  . Hypotension 07/15/2013  . Dyspnea and respiratory abnormality 07/13/2013  . S/P MVR (mitral valve repair) 01/14/2011  . HTN (hypertension)   . GERD (gastroesophageal reflux disease)   . Osteoporosis   . CAD (coronary artery disease)   . Mitral valve prolapse with severe mitral regurgitation   . DIAPHRAGMATIC DISORDER 02/09/2010  . HYPERTENSION 02/05/2010  . OSTEOPOROSIS 02/05/2010  . CARDIAC MURMUR, SYSTOLIC [id number]/[id number]  . COUGH 02/05/2010    Hilja Kintzel L Breedlove 10-07-15,  11:51 AM  [place], [place], [address] Phone: [phone]   Fax:  [phone]  Name: [name] MRN: [id number] Date of Birth: 1940/05/19    PHYSICAL THERAPY DISCHARGE SUMMARY  Visits from Start of Care: 6 Current functional level related to goals / functional outcomes: Pt is now able to independently manage edema through self drainage technique and compression garments. He is also independent with a home exercise program.   Remaining deficits: none  Education / Equipment: Home exercise program Plan: Patient agrees to discharge.  Patient goals were met. Patient is being discharged due to meeting the stated rehab goals.  ?????       Allyson Sabal, PT 09/22/2015 11:52 AM

## 2015-10-18 ENCOUNTER — Telehealth: Payer: Self-pay | Admitting: *Deleted

## 2015-10-18 NOTE — Telephone Encounter (Signed)
Samples for Breo 100 (x2) were placed up front to pick up. These were never picked up.  Samples have been logged back in and put back on the shelf. Nothing further needed.

## 2015-10-20 ENCOUNTER — Encounter: Payer: Medicare Other | Admitting: Physical Therapy

## 2015-10-23 ENCOUNTER — Ambulatory Visit: Payer: Medicare Other | Admitting: Physical Therapy

## 2015-10-23 ENCOUNTER — Telehealth: Payer: Self-pay | Admitting: Physical Therapy

## 2015-10-23 NOTE — Telephone Encounter (Signed)
Called pt and left message regarding appt, he has been discharge and left a vm on his cell

## 2015-10-25 DIAGNOSIS — C4360 Malignant melanoma of unspecified upper limb, including shoulder: Secondary | ICD-10-CM | POA: Diagnosis not present

## 2015-10-26 ENCOUNTER — Ambulatory Visit: Payer: Medicare Other | Admitting: Physical Therapy

## 2015-11-02 DIAGNOSIS — E559 Vitamin D deficiency, unspecified: Secondary | ICD-10-CM | POA: Diagnosis not present

## 2015-11-02 DIAGNOSIS — C439 Malignant melanoma of skin, unspecified: Secondary | ICD-10-CM | POA: Diagnosis not present

## 2015-11-02 DIAGNOSIS — C61 Malignant neoplasm of prostate: Secondary | ICD-10-CM | POA: Diagnosis not present

## 2015-11-02 DIAGNOSIS — M545 Low back pain: Secondary | ICD-10-CM | POA: Diagnosis not present

## 2015-11-24 ENCOUNTER — Telehealth: Payer: Self-pay | Admitting: Oncology

## 2015-11-24 ENCOUNTER — Encounter: Payer: Self-pay | Admitting: Oncology

## 2015-11-24 NOTE — Telephone Encounter (Signed)
Appointment scheduled with Shadad  On 8/30 at 11am. Patient agreed. Demographics verified. Letter mailed to the patient.

## 2015-11-29 ENCOUNTER — Encounter: Payer: Self-pay | Admitting: Internal Medicine

## 2015-11-29 ENCOUNTER — Ambulatory Visit (INDEPENDENT_AMBULATORY_CARE_PROVIDER_SITE_OTHER): Payer: Medicare Other | Admitting: Internal Medicine

## 2015-11-29 VITALS — BP 118/68 | HR 62 | Ht 67.0 in | Wt 170.0 lb

## 2015-11-29 DIAGNOSIS — J4520 Mild intermittent asthma, uncomplicated: Secondary | ICD-10-CM | POA: Diagnosis not present

## 2015-11-29 NOTE — Progress Notes (Signed)
Subjective:     Patient ID: [name], male   DOB: [birth year], 75 y.o.   MRN: [id number]  HPI    #Mitral valve replacement 2011,    #S/p RCA stent for asymptomatic > 99%  RCA stenosis in 2011 prior to MVR.     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 in 2011),   #chronic multi-year waxing and waning voice hoarseness  - per hx 2014 workup at [facility] - Dx with LPR and s/p voice rehab  - symptoms improve but not resolve with H2 blockade or PPI  #chronic cough end 2014 through spring 2015 post [place] trip  -due to  GERD, post viral reactive airway and asthma  -  advair started April 2015  #Fatigue / dyspnea end 2014 through summer 2015 - following viral infecion end 2014 [place] trip and then urosepsis admission at [facility] March/early April 2015  - No ILD CT chest 07/13/13   - CPST test 08/06/13 (below) done while he was taking his half his usual coreg and while he still had cough showed - poor exercise capacity (52%) with lowered anaerobic threshld. He was wheezing bilaterally at end of test and had significant drop in his Fev1 ; confirming exercise induced asthma. In additon, he had circulatory limitation in terms of a heart rate gap and flat stroke volume. There was NO ischemic response  #Hx of Failed PFT all life   #asthma   -diagnosed spring 2015 in midst of fatigue, cough, dyspnea following viral infection end 2014 [place] trop   -on basis of abnormal PFT, CPST showing EIB and FeNO > 25 at [place] - spring 2015  #Micropulm nodules - seen CT 07/13/13   OV 01/27/2014  [name] returns for followup of chronic hoarseness, chronic cough, and asthma.  LAst visit advised advair, and aggressive gerd precaution (no fish oil, ppi and lean diet) and strick compliance with same . It appers he took his advair for a while. THis helped cough and then cough resolved. AFter this he has  been only 25% compliant with advair taking it on days when he i s abit more symptomatic with cough and hoarseness. Marland Kitchen His fatigue and dyspnea though seems independent of the cough and hoarseness and was related to his urosepsis in April 2015 and this has resolved as he reovered from it. He has been exercising in gym and feeling well. He is still bothered bu the hoarse voice and is random though H2 blockade or PPI helps. However, he feels he does not need these meds on daily basis.  Spirometry today shows significant decline in lung function cmpared to [facility] 2015. Fev1 0;96L/31%, ratio 69 (baseline march 2015 pre-bd was 1.3L/44, ratio 75 with 15% Post BD response to an fev1 1.47L/51%).  Exhaled NO - 26 and c/w asthma  He is reluctant to take MDIs. Says he wants data and feels it should be data that he charts at home in a tabular column based However, after hearing of lung function decline he is more acceptable to take his mdi on a regular basis.    Past, Family, Social reviewed: he is once again planning [place] trip 02/28/14    OV 02/22/2014  Chief Complaint  Patient presents with  . Follow-up    Pt states his SOB has improved since last OV. Pt c/o hoarseness. Pt denies cough and CP/tightness.    Feels better; improved ET with  stairs. But still doing advair 1 puff once daily only. Due to go to [place] in few days. Will have flu shot with pcp. Spirometyr today shows: VOice up and down hoarse like before without change. Good with gargling. COuld not do spirometry today todue to malfunction in wifi networkds    AcuteOV 05/31/2014   [name] presents for follow-up. I met his daughter socially a few days ago and there was concern that when he picks up a viral infection that he has a hard time bouncing back. In addition daughter was concerned about persistent hoarseness of the voice. Last saw patient in November 2015 prior to [place] trip. At that visit I insisted on continued Advair compliance.  He said he did the same and did not have any respiratory problems during the trip to [place].  Now for the lastfew weeks he picked upper respiratory infection with a lot of mucus drainage. A week and with illness he was seen at his primary care physician's office and was given Z-Pak. He is not so sure the Z-Pak helped him or he improved spontaneously. In balance he thinks that some of the mucus drainage might have improved with a Z-Pak. Currently he says that he is back to his baseline health. His asthma control questionnaire 5. Scale average is 0.6 showing good control of asthma. He says he is compliant with his Advair and does not miss any dose. His chronic hoarseness continues with waxing and waning quality. We again went over this. It is clear from his description that at [facility] that they have described and diagnosed him with paradoxical vocal cord dysfunction. He does not feel that that is another need for repeat ENT evaluation.  The main concerning thing today is that his lung function continues to be severely restrictive/obstructive. It is improved compared to fall 2015 since he went on Advair but only a little bit. It still reduced compared to a year ago in March 2015. However he states that he does not have any limitations with his daily exercise routine.     OV 08/08/2014  Chief Complaint  Patient presents with  . Follow-up    Pt stated his breathing has imporved since last OV. Pt stated does not need his rescue inhaler as much as he use to. Pt denies cough, SOB, and CP/tightness.      Follow-up moderate persistent asthma  Since last visit and for every 2016 I switched him to [facility] and with this he's had significant relief. He says that this is the best inhaler ever even though this is more expensive. He does not like Advair. He says that he is able to hold his breath many more seconds then when he was on other inhalers. He likes to once a day concept. He does not want to switch to other  inhalers. He prefers managing with some samples and occasionally painful 1. Currently feels his asthma is well controlled. Asthma control questionnaire is 0.2 symptoms for the last 1 weeks adjusting very good control. 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 Follow-up    Pt states his breathing has improved since last OV. Pt denies cough and CP/tightness.     Follow-up moderate persistent asthma   He presents with his wife. He is now on low-dose BREOP mouth once daily. He likes it much better than Advair. At times he is not certain that he is getting the right quantity of 30 but overall he is doing well. Asthma control questionnaire 5. scale is 0.2 showing good control. He still has a strap on his right foot but he is able to play golf but he is limited with his exercise and he is gained weight. He thinks part of the weight gain is dutasteride's and therefore wants to reduce this inhaler. He's planning a trip to [place] in December 2016.  Currently because of asthma he says that he does not wake up in the middle of the night. When he wakes up early in the morning there are no symptoms from asthma. He has some physical limitations but this is not because of asthma. He has very little shortness of breath. He is not having any wheeze. But he does use his albuterol 1-2 puffs as needed on most days.  Past hx: dx with early stage Prostate CA at [facility]  n  obs Rx   OV 02/28/2015  Chief Complaint  Patient presents with  . Follow-up    Pt states he has a recent cold. Pt states his cough was productive but now is non prod. Pt c/o sinus congestion. Pt states his SOB at baseline. Pt denies CP/tightness and f/c/s.    follow-up moderate persistent asthma follow-up moderate persistent asthma   He is now taking Brio 3 times a week on 1 week and then 4 times a week on another week. He says his asthma is well controlled. The last 2 days he's had a cold with some yellow mucus just once a day. He is not any worse in terms of his asthma. He does not feel like he needs antibiotic. He does not feel he needs steroids. Overall he feels his asthma control is excellent. He does not have any nocturnal symptoms or daytime symptoms. He is worried about the long-term side effects of steroids and therefore he wants to continue with his 3 times a week regimen of the Brio  Past medical history  0 he is now status post prostate cancer surgery at [facility] approximately 5-6 weeks ago. He denies any postoperative pulmonary complications. However at the time of surgery when I spoke to his daughter she mentioned something about hypercapnia that was transient and brief either  perioperative or postop. Patient is unaware of this  - He has gained weight because of inactivity following his right lower extremity ankle injury and then the prostate cancer surgery. No history of any DVT. He plans to resume exercise soon  - He is up-to-date with his flu shot  OV 07/19/2015  Chief Complaint  Patient presents with  . Follow-up    Pt states his breathing is unchanged, pt states his breathing is doing well. Pt had prostate surgery in late 2016 and had melanoma removed from right thumb. Pt denies cough and CP/tightness.     Presents with wife. Last seen nov 2016. In interim has had new rt thumb melanoma diagnosis. S/p excision at [facility].  No axillary nodes. His niece died  last week from ovarian cancer. HAs gained some weight. Asthma is considered stable. Reports different tastes with different ellipta   OV 11/29/2015  Chief Complaint  Patient presents with  . Follow-up    Pt reports that his breathing is doing well. Pt denies cough/wheeze/shob/cp/tightness. Pt uses Breo every other day, but has run out and has not taken in one week.    Follow-up  mild persistent asthma on Brio  Since last visit he visited [place] and his been doing well. While in [place]. Ran out of his Brio and did not take it. He feels he been doing well without any problems. No nocturnal awakenings no albuterol rescue use. When he wakes up no symptoms of shortness of breath no cough no wheeze he wants to trial off Brio and see how he does. Nevertheless he wants a sample of Brio to keep at home for safety    ASthma Contril panel March 2015  Oct 2015 05/31/2014  2//22/16 08/08/2014  11/08/2014  02/28/2015  11/29/2015   Rx plan at visit entry    With prednisone empiric triak BREO high dose since feb   Brio 3 times a week  Off breo  Fev1 1.3L/44,% -> 1.47L 0.96/39% 1.1/37% 1.26L/43% x  x   FVC   1.6L/40% 1.75L/44% x  x   Ratio 75 69 70 72 x  x   fef 25-75%     x  x   TLC     x  x   DLCO     x  xx   ACQ-5 (1 week)   0.6  0.3 0.2 x   FeNO  25 at Dr Remus Blake office   21 ppb 08/08/2014  23 x   Rx rec at end of visit  Rec to restart advair  Start BREO Taper to low dose breo reduc eto alt day breo  xBrio 3 times a week  Off bro and monitor            has a past medical history of Asthma; CAD (coronary artery disease); Cancer ([place]) (2016); Complication of anesthesia; GERD (gastroesophageal reflux disease); HTN (hypertension); Mitral valve prolapse; Osteoporosis; TIA (transient ischemic attack); and UTI (lower urinary tract infection).   reports that he has never smoked. He has never used smokeless tobacco.  Past Surgical History:  Procedure Laterality Date  . APPENDECTOMY    . CORONARY  ANGIOPLASTY WITH STENT PLACEMENT  04/30/2010   PCI and stenting of proximal RCA  . MITRAL VALVE REPAIR  06/13/2010   right mini thoracotomy for complex valvuloplasty with 78m Memo ring annuloplasty - Dr. ORoxy Manns . NAILBED REPAIR Right 05/02/2015   Procedure: BIOPSY NAIL BED RIGHT THUMB;  Surgeon: GDaryll Brod MD;  [place]  Service: Orthopedics;  Laterality: Right;  ANESTHESIA:  IV REGIONAL FAB  . PROSTATECTOMY  2016    Allergies  Allergen Reactions  . Penicillins Itching and Rash    Immunization History  Administered Date(s) Administered  . Influenza Split 07/08/2013  . Influenza,inj,Quad PF,36+ Mos 02/27/2015  . Pneumococcal Polysaccharide-23 04/16/2011    Family History  Problem Relation Age of Onset  . Heart disease Father   . Heart disease Mother      Current Outpatient Prescriptions:  .  albuterol (PROVENTIL HFA;VENTOLIN HFA) 108 (90 BASE) MCG/ACT inhaler, Inhale 2 puffs into the lungs every 6 (six) hours as needed for wheezing or shortness of breath., Disp: 1 Inhaler, Rfl: 6 .  aspirin 81 MG tablet, Take 81 mg by mouth daily.  , Disp: , Rfl:  .  calcium-vitamin D (OSCAL WITH D) 500-200 MG-UNIT per tablet, Take 2 tablets by mouth daily.  , Disp: , Rfl:  .  Cholecalciferol (VITAMIN D3) 1000 UNITS CAPS, Take 1 capsule by mouth daily., Disp: , Rfl:  .  Coenzyme Q10 (CO Q 10) 100 MG CAPS, Take 1 capsule by mouth daily., Disp: , Rfl:  .  famotidine (PEPCID) 10 MG tablet, Take 10 mg by mouth daily. , Disp: , Rfl:  .  fluticasone furoate-vilanterol (BREO ELLIPTA) 100-25 MCG/INH AEPB, Inhale 1 puff into the lungs daily. (Patient taking differently: Inhale 1 puff into the lungs every other day. ), Disp: 2 each, Rfl: 0 .  losartan-hydrochlorothiazide (HYZAAR) 50-12.5 MG tablet, Take 1 tablet by mouth daily., Disp: , Rfl:  .  Magnesium 400 MG CAPS, Take 1 capsule by mouth daily., Disp: , Rfl:  .  nebivolol (BYSTOLIC) 10 MG tablet, Take 10 mg by mouth daily.,  Disp: , Rfl:  .  rosuvastatin (CRESTOR) 10 MG tablet, Take 10 mg by mouth daily., Disp: , Rfl:     Review of Systems     Objective:   Physical Exam  Constitutional: He is oriented to person, place, and time. He appears well-developed and well-nourished. No distress.  HENT:  Head: Normocephalic and atraumatic.  Right Ear: External ear normal.  Left Ear: External ear normal.  Mouth/Throat: Oropharynx is clear and moist. No oropharyngeal exudate.  Eyes: Conjunctivae and EOM are normal. Pupils are equal, round, and reactive to light. Right eye exhibits no discharge. Left eye exhibits no discharge. No scleral icterus.  Neck: Normal range of motion. Neck supple. No JVD present. No tracheal deviation present. No thyromegaly present.  Cardiovascular: Normal rate, regular rhythm and intact distal pulses.  Exam reveals no gallop and no friction rub.   No murmur heard. Pulmonary/Chest: Effort normal and breath sounds normal. No respiratory distress. He has no wheezes. He has no rales. He exhibits no tenderness.  Abdominal: Soft. Bowel sounds are normal. He exhibits no distension and no mass. There is no tenderness. There is no rebound and no guarding.  Musculoskeletal: Normal range of motion. He exhibits no edema or tenderness.  Partially amputated thumb  Lymphadenopathy:    He has no cervical adenopathy.  Neurological: He is alert and oriented to person, place, and time. He has normal reflexes. No cranial nerve deficit. Coordination normal.  Skin: Skin is warm and dry. No rash noted. He is not diaphoretic. No erythema. No pallor.  Psychiatric: He has a normal mood and affect. His behavior is normal. Judgment and thought content normal.  Nursing note and vitals reviewed.   Vitals:   11/29/15 1024  BP: 118/68  Pulse: 62  SpO2: 95%  Weight: 170 lb (77.1 kg)  Height: 5\' 7"  (1.702 m)        Assessment:       ICD-9-CM ICD-10-CM   1. Asthma, mild intermittent, uncomplicated 493.90 J45.20     He likely has improved in terms of his asthma. Looking back at his history appears that most of the times it is flared up after visits to 90 after being exposed to significant delusional viral infections. We can continue to monitor him on albuterol as needed and see how he does    Plan:      Asthma appears improved to mild intermittent status  Plan - Use albuterol as needed - BREO we will  give you sample for  Safety - Make sure you get the flu shot in the fall  Follow-up 6-9 months or as needed    Dr. Brand Males, M.D., [facility].C.P Pulmonary and Critical Care Medicine Staff Physician [facility] Pager: [phone], If no answer or between  15:00h - 7:00h: call [phone]  11/29/2015 5:54 PM

## 2015-11-29 NOTE — Patient Instructions (Signed)
ICD-9-CM ICD-10-CM   1. Asthma, mild intermittent, uncomplicated 123456 [id number]    Asthma appears improved to mild intermittent status  Plan - Use albuterol as needed - BREO we will give you sample for  Safety - Make sure you get the flu shot in the fall  Follow-up 6-9 months or as needed

## 2015-11-30 DIAGNOSIS — D1801 Hemangioma of skin and subcutaneous tissue: Secondary | ICD-10-CM | POA: Diagnosis not present

## 2015-11-30 DIAGNOSIS — Z85820 Personal history of malignant melanoma of skin: Secondary | ICD-10-CM | POA: Diagnosis not present

## 2015-11-30 DIAGNOSIS — D220 Melanocytic nevi of lip: Secondary | ICD-10-CM | POA: Diagnosis not present

## 2015-11-30 DIAGNOSIS — L821 Other seborrheic keratosis: Secondary | ICD-10-CM | POA: Diagnosis not present

## 2015-11-30 DIAGNOSIS — D2272 Melanocytic nevi of left lower limb, including hip: Secondary | ICD-10-CM | POA: Diagnosis not present

## 2015-12-13 ENCOUNTER — Telehealth: Payer: Self-pay | Admitting: *Deleted

## 2015-12-13 ENCOUNTER — Ambulatory Visit (HOSPITAL_BASED_OUTPATIENT_CLINIC_OR_DEPARTMENT_OTHER): Payer: Medicare Other | Admitting: Oncology

## 2015-12-13 ENCOUNTER — Ambulatory Visit (HOSPITAL_COMMUNITY)
Admission: RE | Admit: 2015-12-13 | Discharge: 2015-12-13 | Disposition: A | Discharge status: Home / Self Care | Payer: Medicare Other | Source: Ambulatory Visit | Attending: Oncology | Admitting: Oncology

## 2015-12-13 VITALS — BP 131/59 | HR 63 | Temp 97.6°F | Resp 17 | Wt 169.8 lb

## 2015-12-13 DIAGNOSIS — Z8546 Personal history of malignant neoplasm of prostate: Secondary | ICD-10-CM | POA: Diagnosis not present

## 2015-12-13 DIAGNOSIS — C4361 Malignant melanoma of right upper limb, including shoulder: Secondary | ICD-10-CM

## 2015-12-13 DIAGNOSIS — C439 Malignant melanoma of skin, unspecified: Secondary | ICD-10-CM | POA: Diagnosis not present

## 2015-12-13 DIAGNOSIS — J984 Other disorders of lung: Secondary | ICD-10-CM | POA: Diagnosis not present

## 2015-12-13 NOTE — Progress Notes (Signed)
Reason for Referral: Melanoma.   HPI: This is a pleasant 75 year old gentleman currently of [place] where he lived for the last 40 years. Gentleman with history of coronary artery disease as well as prostate cancer. He was found to have prostate cancer and underwent prostatectomy under the care of Dr. Kerby Nora [facility]. During his hospitalization, he was noted to have discoloration under his thumbnail. his report, he have noticed this for the last few years but did not think too much of it. He subsequently underwent a biopsy of the lesion under his nail and found to be subungual melanoma. He was subsequently referred to [facility] for evaluation. His right thumb subungual melanoma was measured at 1.45 mm and she was evaluated by Dr. Crisoforo Oxford and right thumb amputation was planned on March 6 with lymph node dissection. That was completed without major complications and the final pathology revealed a 1.4 mm malignant melanoma in his right axillary lymph node dissection did not reveal any lymph node involvement indicating stage II disease with a final pathological staging was T2a N0. After brief post operative follow-up at [facility] he decided to establish care locally. He received physical therapy for lymphedema prevention and have been doing very well at this time. He does not report any constitutional symptoms. Does not report any respiratory complaints. Continues to perform activities of daily living without any decline.  He does not report any headaches, blurry vision, syncope or seizures. He does not report any fevers or chills or sweats. He does not report any cough, wheezing or hemoptysis. He does not report any nausea, vomiting or abdominal pain. He does not report any frequency urgency or hesitancy. He does not report any skeletal complaints. Remaining review of systems unremarkable.   Past Medical History:  Diagnosis Date  .  Asthma   . CAD (coronary artery disease)   . Cancer [facility]) 2016   prostate  . Complication of anesthesia    CO2 high according to patient   . GERD (gastroesophageal reflux disease)   . HTN (hypertension)   . Mitral valve prolapse   . Osteoporosis   . TIA (transient ischemic attack)   . UTI (lower urinary tract infection)   :  Past Surgical History:  Procedure Laterality Date  . APPENDECTOMY    . CORONARY ANGIOPLASTY WITH STENT PLACEMENT  04/30/2010   PCI and stenting of proximal RCA  . MITRAL VALVE REPAIR  06/13/2010   right mini thoracotomy for complex valvuloplasty with 26mm Memo ring annuloplasty - Dr. Roxy Manns  . NAILBED REPAIR Right 05/02/2015   Procedure: BIOPSY NAIL BED RIGHT THUMB;  Surgeon: Daryll Brod, MD;  Location: [place];  Service: Orthopedics;  Laterality: Right;  ANESTHESIA:  IV REGIONAL FAB  . PROSTATECTOMY  2016  :   Current Outpatient Prescriptions:  .  albuterol (PROVENTIL HFA;VENTOLIN HFA) 108 (90 BASE) MCG/ACT inhaler, Inhale 2 puffs into the lungs every 6 (six) hours as needed for wheezing or shortness of breath., Disp: 1 Inhaler, Rfl: 6 .  aspirin 81 MG tablet, Take 81 mg by mouth daily.  , Disp: , Rfl:  .  calcium-vitamin D (OSCAL WITH D) 500-200 MG-UNIT per tablet, Take 2 tablets by mouth daily.  , Disp: , Rfl:  .  Cholecalciferol (VITAMIN D3) 1000 UNITS CAPS, Take 1 capsule by mouth daily., Disp: , Rfl:  .  Coenzyme Q10 (CO Q 10) 100 MG CAPS, Take 1 capsule by mouth daily.,  Disp: , Rfl:  .  losartan-hydrochlorothiazide (HYZAAR) 50-12.5 MG tablet, Take 1 tablet by mouth daily., Disp: , Rfl:  .  Magnesium 400 MG CAPS, Take 1 capsule by mouth daily., Disp: , Rfl:  .  nebivolol (BYSTOLIC) 10 MG tablet, Take 10 mg by mouth daily., Disp: , Rfl:  .  rosuvastatin (CRESTOR) 10 MG tablet, Take 10 mg by mouth daily., Disp: , Rfl:  .  famotidine (PEPCID) 10 MG tablet, Take 10 mg by mouth daily. , Disp: , Rfl: :  Allergies  Allergen Reactions  .  Penicillins Itching and Rash  :  Family History  Problem Relation Age of Onset  . Heart disease Father   . Heart disease Mother   :  Social History   Social History  . Marital status: Married    Spouse name: N/A  . Number of children: N/A  . Years of education: N/A   Occupational History  . retired Retired   Social History Main Topics  . Smoking status: Never Smoker  . Smokeless tobacco: Never Used  . Alcohol use Yes     Comment: SOCIAL  . Drug use: No  . Sexual activity: Not on file   Other Topics Concern  . Not on file   Social History Narrative  . No narrative on file  :  Pertinent items are noted in HPI.  Exam: Blood pressure (!) 131/59, pulse 63, temperature 97.6 F (36.4 C), temperature source Oral, resp. rate 17, weight 169 lb 12.8 oz (77 kg), SpO2 100 %. General appearance: alert and cooperative Head: Normocephalic, without obvious abnormality Throat: lips, mucosa, and tongue normal; teeth and gums normal Neck: no adenopathy Back: negative Resp: clear to auscultation bilaterally Cardio: regular rate and rhythm, S1, S2 normal, no murmur, click, rub or gallop GI: soft, non-tender; bowel sounds normal; no masses,  no organomegaly Extremities: extremities normal, atraumatic, no cyanosis or edema Pulses: 2+ and symmetric  CBC    Component Value Date/Time   WBC 11.9 (H) 07/18/2013 0515   RBC 3.56 (L) 07/18/2013 0515   HGB 11.4 (L) 07/18/2013 0515   HCT 32.5 (L) 07/18/2013 0515   PLT 178 07/18/2013 0515   MCV 91.3 07/18/2013 0515   MCH 32.0 07/18/2013 0515   MCHC 35.1 07/18/2013 0515   RDW 13.5 07/18/2013 0515   LYMPHSABS 1.0 07/18/2013 0515   MONOABS 1.3 (H) 07/18/2013 0515   EOSABS 0.2 07/18/2013 0515   BASOSABS 0.0 07/18/2013 0515     Chemistry      Component Value Date/Time   NA 139 05/01/2015 1145   K 4.5 05/01/2015 1145   CL 104 05/01/2015 1145   CO2 27 05/01/2015 1145   BUN 21 (H) 05/01/2015 1145   CREATININE 1.31 (H) 05/01/2015 1145       Component Value Date/Time   CALCIUM 9.4 05/01/2015 1145   ALKPHOS 78 07/16/2013 0349   AST 23 07/16/2013 0349   ALT 22 07/16/2013 0349   BILITOT 0.7 07/16/2013 0349       Assessment and Plan:    75 year old gentleman with the following issues:  1. 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 He is status post amputation and plastic reconstruction and axillary lymph node dissection on March 2017. The final pathology was T2a N0.  The natural course of this disease was discussed today with the patient and his wife. The etiology of this melanoma and his previous exposure history were were discussed today in detail. 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  His melanoma have been adequately resected and at this time there is no role for adjuvant therapy in this particular setting. I recommended continued observation and surveillance and repeat chest x-ray and laboratory testing in 6 months. Obtain other imaging studies if he develops any symptoms or he develops any laboratory abnormalities.  I encouraged him to continue routine skin examination with his dermatologist. We also discussed her strategies for skin protection from UV light.  2. Prostate cancer: Status post prostatectomy without any evidence of recurrence at this time. He continues to follow with urology regarding this issue.  3. Follow-up: Will be in 6 months for continued medical oncology follow-up.

## 2015-12-13 NOTE — Telephone Encounter (Signed)
As noted below by Dr. Alen Blew, I informed patient that his chest xray is normal. Patient verbalized understanding.

## 2015-12-13 NOTE — Telephone Encounter (Signed)
-----   Message from Wyatt Portela, MD sent at 12/13/2015  1:04 PM EDT ----- Please let him know his CXR is normal.

## 2015-12-21 DIAGNOSIS — I351 Nonrheumatic aortic (valve) insufficiency: Secondary | ICD-10-CM | POA: Diagnosis not present

## 2015-12-21 DIAGNOSIS — I2510 Atherosclerotic heart disease of native coronary artery without angina pectoris: Secondary | ICD-10-CM | POA: Diagnosis not present

## 2015-12-21 DIAGNOSIS — E7800 Pure hypercholesterolemia, unspecified: Secondary | ICD-10-CM | POA: Diagnosis not present

## 2015-12-21 DIAGNOSIS — Z9889 Other specified postprocedural states: Secondary | ICD-10-CM | POA: Diagnosis not present

## 2015-12-29 DIAGNOSIS — D2272 Melanocytic nevi of left lower limb, including hip: Secondary | ICD-10-CM | POA: Diagnosis not present

## 2015-12-29 DIAGNOSIS — D692 Other nonthrombocytopenic purpura: Secondary | ICD-10-CM | POA: Diagnosis not present

## 2016-01-03 DIAGNOSIS — C61 Malignant neoplasm of prostate: Secondary | ICD-10-CM | POA: Diagnosis not present

## 2016-01-03 DIAGNOSIS — I10 Essential (primary) hypertension: Secondary | ICD-10-CM | POA: Diagnosis not present

## 2016-01-03 DIAGNOSIS — E039 Hypothyroidism, unspecified: Secondary | ICD-10-CM | POA: Diagnosis not present

## 2016-01-03 DIAGNOSIS — E559 Vitamin D deficiency, unspecified: Secondary | ICD-10-CM | POA: Diagnosis not present

## 2016-01-11 ENCOUNTER — Telehealth: Payer: Self-pay | Admitting: Oncology

## 2016-01-11 DIAGNOSIS — Z0000 Encounter for general adult medical examination without abnormal findings: Secondary | ICD-10-CM | POA: Diagnosis not present

## 2016-01-11 DIAGNOSIS — M25561 Pain in right knee: Secondary | ICD-10-CM | POA: Diagnosis not present

## 2016-01-11 DIAGNOSIS — E039 Hypothyroidism, unspecified: Secondary | ICD-10-CM | POA: Diagnosis not present

## 2016-01-11 DIAGNOSIS — I2510 Atherosclerotic heart disease of native coronary artery without angina pectoris: Secondary | ICD-10-CM | POA: Diagnosis not present

## 2016-01-11 DIAGNOSIS — Z23 Encounter for immunization: Secondary | ICD-10-CM | POA: Diagnosis not present

## 2016-01-11 NOTE — Telephone Encounter (Signed)
Appointments confirmed with patient, per 12/13/15 los.

## 2016-03-18 DIAGNOSIS — E039 Hypothyroidism, unspecified: Secondary | ICD-10-CM | POA: Diagnosis not present

## 2016-03-18 DIAGNOSIS — R739 Hyperglycemia, unspecified: Secondary | ICD-10-CM | POA: Diagnosis not present

## 2016-03-19 DIAGNOSIS — M7981 Nontraumatic hematoma of soft tissue: Secondary | ICD-10-CM | POA: Diagnosis not present

## 2016-03-19 DIAGNOSIS — Z85820 Personal history of malignant melanoma of skin: Secondary | ICD-10-CM | POA: Diagnosis not present

## 2016-03-21 DIAGNOSIS — H9190 Unspecified hearing loss, unspecified ear: Secondary | ICD-10-CM | POA: Diagnosis not present

## 2016-03-21 DIAGNOSIS — R413 Other amnesia: Secondary | ICD-10-CM | POA: Diagnosis not present

## 2016-04-02 DIAGNOSIS — H903 Sensorineural hearing loss, bilateral: Secondary | ICD-10-CM | POA: Diagnosis not present

## 2016-04-04 ENCOUNTER — Telehealth: Payer: Self-pay | Admitting: Internal Medicine

## 2016-04-04 NOTE — Telephone Encounter (Signed)
pleae give him samples of breo - prior dose - for his [place] trip  Than ks  Dr. Brand Males, M.D., [facility].C.P Pulmonary and Critical Care Medicine Staff Physician [facility] Pager: [phone], If no answer or between  15:00h - 7:00h: call [phone]  04/04/2016 9:44 PM

## 2016-04-04 NOTE — Telephone Encounter (Signed)
[id number] x1 

## 2016-04-05 NOTE — Telephone Encounter (Signed)
Samples of Breo 100 have been left up front. Pt is aware. Nothing further was needed.

## 2016-04-09 DIAGNOSIS — M2241 Chondromalacia patellae, right knee: Secondary | ICD-10-CM | POA: Diagnosis not present

## 2016-04-09 DIAGNOSIS — M2242 Chondromalacia patellae, left knee: Secondary | ICD-10-CM | POA: Diagnosis not present

## 2016-04-19 DIAGNOSIS — R413 Other amnesia: Secondary | ICD-10-CM | POA: Diagnosis not present

## 2016-04-19 DIAGNOSIS — I2510 Atherosclerotic heart disease of native coronary artery without angina pectoris: Secondary | ICD-10-CM | POA: Diagnosis not present

## 2016-06-11 ENCOUNTER — Encounter: Payer: Self-pay | Admitting: Neurology

## 2016-06-11 ENCOUNTER — Ambulatory Visit (INDEPENDENT_AMBULATORY_CARE_PROVIDER_SITE_OTHER): Payer: Medicare Other | Admitting: Neurology

## 2016-06-11 VITALS — BP 128/64 | HR 64 | Resp 16 | Ht 67.0 in | Wt 164.0 lb

## 2016-06-11 DIAGNOSIS — G4752 REM sleep behavior disorder: Secondary | ICD-10-CM

## 2016-06-11 DIAGNOSIS — I749 Embolism and thrombosis of unspecified artery: Secondary | ICD-10-CM

## 2016-06-11 DIAGNOSIS — J986 Disorders of diaphragm: Secondary | ICD-10-CM | POA: Diagnosis not present

## 2016-06-11 DIAGNOSIS — Z955 Presence of coronary angioplasty implant and graft: Secondary | ICD-10-CM | POA: Diagnosis not present

## 2016-06-11 DIAGNOSIS — G459 Transient cerebral ischemic attack, unspecified: Secondary | ICD-10-CM | POA: Insufficient documentation

## 2016-06-11 DIAGNOSIS — G3184 Mild cognitive impairment, so stated: Secondary | ICD-10-CM

## 2016-06-11 DIAGNOSIS — Z9889 Other specified postprocedural states: Secondary | ICD-10-CM

## 2016-06-11 NOTE — Patient Instructions (Signed)
To improve memory function, regular hydration, food intake and sleep as necessary establish and maintain an exercise routine. We will check your memory function with a [place] cognitive assessment is in the next 2-3 months again.  At that time I will also ask you if you sleep habits have improved to see if you get 7 or 8 hours of nocturnal sleep. I urge you to try a low-dose melatonin just before bedtime to suppress REM behaviors. These are  phases during which you act out dreams.   In addition, I have ordered an MRI of the brain with and without contrast given that you have a history of heart valve disease, previous TIA and cardiac stenting and recently were diagnosed with a malignant melanoma which had not metastasized. I would like to look at the brain with and without contrast for this reason-  but also like you to keep hydrated.
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
## 2016-06-12 ENCOUNTER — Other Ambulatory Visit (HOSPITAL_BASED_OUTPATIENT_CLINIC_OR_DEPARTMENT_OTHER): Payer: Medicare Other

## 2016-06-12 DIAGNOSIS — C439 Malignant melanoma of skin, unspecified: Secondary | ICD-10-CM

## 2016-06-12 DIAGNOSIS — C4361 Malignant melanoma of right upper limb, including shoulder: Secondary | ICD-10-CM

## 2016-06-12 LAB — CBC WITH DIFFERENTIAL/PLATELET
BASO%: 0.9 % (ref 0.0–2.0)
Basophils Absolute: 0.1 10*3/uL (ref 0.0–0.1)
EOS%: 3.1 % (ref 0.0–7.0)
Eosinophils Absolute: 0.3 10*3/uL (ref 0.0–0.5)
HCT: 46.8 % (ref 38.4–49.9)
HGB: 15.7 g/dL (ref 13.0–17.1)
LYMPH%: 17 % (ref 14.0–49.0)
MCH: 31.7 pg (ref 27.2–33.4)
MCHC: 33.5 g/dL (ref 32.0–36.0)
MCV: 94.4 fL (ref 79.3–98.0)
MONO#: 1 10*3/uL — ABNORMAL HIGH (ref 0.1–0.9)
MONO%: 12 % (ref 0.0–14.0)
NEUT#: 5.5 10*3/uL (ref 1.5–6.5)
NEUT%: 67 % (ref 39.0–75.0)
Platelets: 202 10*3/uL (ref 140–400)
RBC: 4.96 10*6/uL (ref 4.20–5.82)
RDW: 13.7 % (ref 11.0–14.6)
WBC: 8.2 10*3/uL (ref 4.0–10.3)
lymph#: 1.4 10*3/uL (ref 0.9–3.3)

## 2016-06-12 LAB — COMPREHENSIVE METABOLIC PANEL
ALT: 21 U/L (ref 0–55)
AST: 24 U/L (ref 5–34)
Albumin: 3.9 g/dL (ref 3.5–5.0)
Alkaline Phosphatase: 73 U/L (ref 40–150)
Anion Gap: 7 mEq/L (ref 3–11)
BUN: 17.7 mg/dL (ref 7.0–26.0)
CO2: 30 mEq/L — ABNORMAL HIGH (ref 22–29)
Calcium: 9.9 mg/dL (ref 8.4–10.4)
Chloride: 104 mEq/L (ref 98–109)
Creatinine: 1.1 mg/dL (ref 0.7–1.3)
EGFR: 64 mL/min/{1.73_m2} — ABNORMAL LOW (ref >90)
Glucose: 99 mg/dl (ref 70–140)
Potassium: 4.5 mEq/L (ref 3.5–5.1)
Sodium: 141 mEq/L (ref 136–145)
Total Bilirubin: 0.97 mg/dL (ref 0.20–1.20)
Total Protein: 7.6 g/dL (ref 6.4–8.3)

## 2016-06-12 LAB — LACTATE DEHYDROGENASE: LDH: 159 U/L (ref 125–245)

## 2016-06-17 DIAGNOSIS — I2510 Atherosclerotic heart disease of native coronary artery without angina pectoris: Secondary | ICD-10-CM | POA: Diagnosis not present

## 2016-06-18 ENCOUNTER — Ambulatory Visit: Payer: Medicare Other | Admitting: Oncology

## 2016-06-18 ENCOUNTER — Other Ambulatory Visit: Payer: Medicare Other

## 2016-06-18 ENCOUNTER — Telehealth: Payer: Self-pay | Admitting: Oncology

## 2016-06-18 NOTE — Telephone Encounter (Signed)
Patient called to reschedule 3/6 /fu to 3/23. Patient has new date/time.

## 2016-06-24 DIAGNOSIS — E559 Vitamin D deficiency, unspecified: Secondary | ICD-10-CM | POA: Diagnosis not present

## 2016-06-24 DIAGNOSIS — I10 Essential (primary) hypertension: Secondary | ICD-10-CM | POA: Diagnosis not present

## 2016-06-24 DIAGNOSIS — E039 Hypothyroidism, unspecified: Secondary | ICD-10-CM | POA: Diagnosis not present

## 2016-06-24 DIAGNOSIS — I2510 Atherosclerotic heart disease of native coronary artery without angina pectoris: Secondary | ICD-10-CM | POA: Diagnosis not present

## 2016-07-04 ENCOUNTER — Ambulatory Visit
Admission: RE | Admit: 2016-07-04 | Discharge: 2016-07-04 | Disposition: A | Discharge status: Home / Self Care | Payer: Medicare Other | Source: Ambulatory Visit | Attending: Neurology | Admitting: Neurology

## 2016-07-04 ENCOUNTER — Telehealth: Payer: Self-pay | Admitting: Neurology

## 2016-07-04 DIAGNOSIS — G459 Transient cerebral ischemic attack, unspecified: Secondary | ICD-10-CM

## 2016-07-04 DIAGNOSIS — G3184 Mild cognitive impairment, so stated: Secondary | ICD-10-CM | POA: Diagnosis not present

## 2016-07-04 DIAGNOSIS — Z955 Presence of coronary angioplasty implant and graft: Secondary | ICD-10-CM

## 2016-07-04 DIAGNOSIS — I749 Embolism and thrombosis of unspecified artery: Secondary | ICD-10-CM

## 2016-07-04 DIAGNOSIS — G4752 REM sleep behavior disorder: Secondary | ICD-10-CM

## 2016-07-04 MED ORDER — GADOBENATE DIMEGLUMINE 529 MG/ML IV SOLN
15.0000 mL | Freq: Once | INTRAVENOUS | Status: AC | PRN
  Administered 2016-07-04: 15 mL via INTRAVENOUS

## 2016-07-04 NOTE — Telephone Encounter (Signed)
I called pt. It looks like the reports for the MRA and MRIs from 02/21/2010 are in [facility] for the reading physician to review. If they need something further from [place], the pt will most likely need to request that information from [place]. Pt verbalized understanding and appreciation for me calling him back so quickly.

## 2016-07-04 NOTE — Telephone Encounter (Signed)
Patient had MRI today at [facility] and he said had MRI at same place on 02-21-10. The patient would like [place] to get that report to compare to the MRI he had today.

## 2016-07-05 ENCOUNTER — Ambulatory Visit (HOSPITAL_BASED_OUTPATIENT_CLINIC_OR_DEPARTMENT_OTHER): Payer: Medicare Other | Admitting: Oncology

## 2016-07-05 ENCOUNTER — Telehealth: Payer: Self-pay | Admitting: Oncology

## 2016-07-05 VITALS — BP 131/65 | HR 59 | Temp 97.4°F | Resp 18 | Wt 162.9 lb

## 2016-07-05 DIAGNOSIS — Z85820 Personal history of malignant melanoma of skin: Secondary | ICD-10-CM

## 2016-07-05 DIAGNOSIS — C439 Malignant melanoma of skin, unspecified: Secondary | ICD-10-CM

## 2016-07-05 DIAGNOSIS — Z8546 Personal history of malignant neoplasm of prostate: Secondary | ICD-10-CM

## 2016-07-05 NOTE — Progress Notes (Signed)
Hematology and Oncology Follow Up Visit  [name] [id number] 1940-08-14 76 y.o. 07/05/2016 10:23 AM [name], MDKim, [name], MD   Principle Diagnosis: 76 year old gentleman with melanoma arising from a subungual right thumb. He is status post amputation and plastic reconstruction and axillary lymph node dissection on March 2017. The final pathology was T2a N0.   Prior Therapy:  His right thumb subungual melanoma was measured at 1.45 mm. Dr. Crisoforo Oxford performed right thumb amputation was planned on March 6 with lymph node dissection. That was completed without major complications and the final pathology revealed a 1.4 mm malignant melanoma in his right axillary lymph node dissection did not reveal any lymph node involvement indicating stage II disease with a final pathological staging was T2a N0.  Status post prostatectomy done at [facility]. The pathology revealed adenocarcinoma Gleason score 3+4 = 7 without any lymph node involvement. Final pathological staging was stage T2cN0  Current therapy: Observation and surveillance.  Interim History:  [name] presents today for a follow-up visit. Since the last visit, he reports no major changes in his health. His family noted some memory and recall issues and was evaluated by neurology. He did undergo MRI of the brain which was completed on 06/06/2016. The final report has not been completed. He has no other complaints at this time. He denied any headaches or blurry vision. He denied any peripheral neuropathy. He denied any skin rashes or lesions. His appetite remain excellent and continues to perform activities of daily living.  He does not report any headaches, blurry vision, syncope or seizures. He does not report any fevers or chills or sweats. He does not report any cough, wheezing or hemoptysis. He does not report any nausea, vomiting or abdominal pain. He does not report any frequency urgency or hesitancy. He does  not report any skeletal complaints. Remaining review of systems unremarkable.   Medications: I have reviewed the patient's current medications.  Current Outpatient Prescriptions  Medication Sig Dispense Refill  . aspirin 81 MG tablet Take 81 mg by mouth daily.      . calcium-vitamin D (OSCAL WITH D) 500-200 MG-UNIT per tablet Take 2 tablets by mouth daily.      . Cholecalciferol (VITAMIN D3) 1000 UNITS CAPS Take 1 capsule by mouth daily.    . Coenzyme Q10 (CO Q 10) 100 MG CAPS Take 1 capsule by mouth daily.    . famotidine (PEPCID) 10 MG tablet Take 10 mg by mouth daily.     Marland Kitchen L-Methylfolate-B12-B6-B2 (CEREFOLIN PO) Take by mouth.    . losartan-hydrochlorothiazide (HYZAAR) 50-12.5 MG tablet Take 1 tablet by mouth daily.    . Magnesium 400 MG CAPS Take 1 capsule by mouth daily.    . nebivolol (BYSTOLIC) 10 MG tablet Take 10 mg by mouth daily.     No current facility-administered medications for this visit.      Allergies:  Allergies  Allergen Reactions  . Penicillins Itching and Rash    Past Medical History, Surgical history, Social history, and Family History were reviewed and updated.  Physical Exam: Blood pressure 131/65, pulse (!) 59, temperature 97.4 F (36.3 C), temperature source Oral, resp. rate 18, weight 162 lb 14.4 oz (73.9 kg), SpO2 100 %. ECOG: 0 General appearance: alert and cooperative appeared without distress. Head: Normocephalic, without obvious abnormality Neck: no adenopathy Lymph nodes: Cervical, supraclavicular, and axillary nodes normal. Heart:regular rate and rhythm, S1, S2 normal, no murmur, click, rub or gallop Lung:chest clear, no  wheezing, rales, normal symmetric air entry Abdomin: soft, non-tender, without masses or organomegaly without shifting dullness or ascites. EXT:no erythema, induration, or nodules. Right partial thumb amputation noted.   Lab Results: Lab Results  Component Value Date   WBC 8.2 06/12/2016   HGB 15.7 06/12/2016   HCT  46.8 06/12/2016   MCV 94.4 06/12/2016   PLT 202 06/12/2016     Chemistry      Component Value Date/Time   NA 141 06/12/2016 1107   K 4.5 06/12/2016 1107   CL 104 05/01/2015 1145   CO2 30 (H) 06/12/2016 1107   BUN 17.7 06/12/2016 1107   CREATININE 1.1 06/12/2016 1107      Component Value Date/Time   CALCIUM 9.9 06/12/2016 1107   ALKPHOS 73 06/12/2016 1107   AST 24 06/12/2016 1107   ALT 21 06/12/2016 1107   BILITOT 0.97 06/12/2016 1107       Radiological Studies:  FINDINGS: There is scarring in the right base with elevation of the right hemidiaphragm. Lungs elsewhere are clear. Heart size and pulmonary vascularity are normal. No adenopathy. Patient is status post mitral valve replacement. There are no blastic or lytic bone lesions.  IMPRESSION: No edema or consolidation. Mild scarring right base. No adenopathy or bone lesions. Stable cardiac silhouette.  Impression and Plan:   76 year old gentleman with the following issues:  1. 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 He is status post amputation and plastic reconstruction and axillary lymph node dissection on March 2017. The final pathology was T2a N0.  He is currently on active surveillance without any evidence to suggest recurrent disease. His laboratory data as well as chest x-ray was unrevealing. The plan is to continue with active surveillance every 6 months for the time being. I continue to encourage him to follow with dermatology for routine skin examination. We have also discussed strategies for skin protection including sunblock as well as long sleeve shirts and hats.   2. Prostate cancer: Status post prostatectomy without any evidence of recurrence at this time. He continues to follow with urology regarding this issue.  3. Follow-up: Will be in 6 months.    [facility], MD 3/23/201810:23 AM

## 2016-07-05 NOTE — Telephone Encounter (Signed)
Gave patient AVS and calender per 07/05/2016 los.  

## 2016-07-08 ENCOUNTER — Telehealth: Payer: Self-pay

## 2016-07-08 NOTE — Telephone Encounter (Signed)
I called pt, advised him that his MRI brain results revealed small vessel disease and atrophy which has progressed over the past 6.5 years. No focal atrophy noted and no acute changes noted. Pt verbalized understanding of results. Pt had no questions at this time but was encouraged to call back if questions arise.

## 2016-07-08 NOTE — Telephone Encounter (Signed)
-----   Message from Larey Seat, MD sent at 07/05/2016  1:08 PM EDT ----- Small vessel disease and atrophy noted to have progressed over 6.5 years. NO focal atrophy. No acute changes ( stroke, bleed, scars ) CD

## 2016-07-29 DIAGNOSIS — Z85820 Personal history of malignant melanoma of skin: Secondary | ICD-10-CM | POA: Diagnosis not present

## 2016-07-29 DIAGNOSIS — D485 Neoplasm of uncertain behavior of skin: Secondary | ICD-10-CM | POA: Diagnosis not present

## 2016-07-29 DIAGNOSIS — L821 Other seborrheic keratosis: Secondary | ICD-10-CM | POA: Diagnosis not present

## 2016-07-29 DIAGNOSIS — D2261 Melanocytic nevi of right upper limb, including shoulder: Secondary | ICD-10-CM | POA: Diagnosis not present

## 2016-07-29 DIAGNOSIS — D2272 Melanocytic nevi of left lower limb, including hip: Secondary | ICD-10-CM | POA: Diagnosis not present

## 2016-07-29 DIAGNOSIS — D2371 Other benign neoplasm of skin of right lower limb, including hip: Secondary | ICD-10-CM | POA: Diagnosis not present

## 2016-08-28 DIAGNOSIS — Z23 Encounter for immunization: Secondary | ICD-10-CM | POA: Diagnosis not present

## 2016-09-11 ENCOUNTER — Ambulatory Visit (INDEPENDENT_AMBULATORY_CARE_PROVIDER_SITE_OTHER): Payer: Medicare Other | Admitting: Neurology

## 2016-09-11 ENCOUNTER — Encounter (INDEPENDENT_AMBULATORY_CARE_PROVIDER_SITE_OTHER): Payer: Self-pay

## 2016-09-11 ENCOUNTER — Ambulatory Visit: Payer: Medicare Other | Admitting: Neurology

## 2016-09-11 ENCOUNTER — Encounter: Payer: Self-pay | Admitting: Neurology

## 2016-09-11 VITALS — BP 108/64 | HR 70 | Resp 20 | Ht 68.0 in | Wt 166.0 lb

## 2016-09-11 DIAGNOSIS — F5102 Adjustment insomnia: Secondary | ICD-10-CM | POA: Diagnosis not present

## 2016-09-11 DIAGNOSIS — G3184 Mild cognitive impairment, so stated: Secondary | ICD-10-CM

## 2016-09-11 DIAGNOSIS — R0683 Snoring: Secondary | ICD-10-CM

## 2016-09-11 DIAGNOSIS — I2510 Atherosclerotic heart disease of native coronary artery without angina pectoris: Secondary | ICD-10-CM

## 2016-09-11 DIAGNOSIS — J986 Disorders of diaphragm: Secondary | ICD-10-CM

## 2016-09-11 DIAGNOSIS — I059 Rheumatic mitral valve disease, unspecified: Secondary | ICD-10-CM | POA: Diagnosis not present

## 2016-09-11 DIAGNOSIS — J45909 Unspecified asthma, uncomplicated: Secondary | ICD-10-CM | POA: Diagnosis not present

## 2016-09-11 MED ORDER — RIVASTIGMINE TARTRATE 1.5 MG PO CAPS: 1.5000 mg | ORAL_CAPSULE | Freq: Two times a day (BID) | ORAL | 6 refills | Status: DC
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
## 2016-09-23 DIAGNOSIS — I10 Essential (primary) hypertension: Secondary | ICD-10-CM | POA: Diagnosis not present

## 2016-09-23 DIAGNOSIS — E039 Hypothyroidism, unspecified: Secondary | ICD-10-CM | POA: Diagnosis not present

## 2016-09-23 DIAGNOSIS — E559 Vitamin D deficiency, unspecified: Secondary | ICD-10-CM | POA: Diagnosis not present

## 2016-09-24 DIAGNOSIS — C61 Malignant neoplasm of prostate: Secondary | ICD-10-CM | POA: Diagnosis not present

## 2016-09-27 DIAGNOSIS — C61 Malignant neoplasm of prostate: Secondary | ICD-10-CM | POA: Diagnosis not present

## 2016-09-27 DIAGNOSIS — K921 Melena: Secondary | ICD-10-CM | POA: Diagnosis not present

## 2016-10-01 DIAGNOSIS — R197 Diarrhea, unspecified: Secondary | ICD-10-CM | POA: Diagnosis not present

## 2016-10-11 DIAGNOSIS — I2510 Atherosclerotic heart disease of native coronary artery without angina pectoris: Secondary | ICD-10-CM | POA: Diagnosis not present

## 2016-10-11 DIAGNOSIS — R413 Other amnesia: Secondary | ICD-10-CM | POA: Diagnosis not present

## 2016-10-11 DIAGNOSIS — C61 Malignant neoplasm of prostate: Secondary | ICD-10-CM | POA: Diagnosis not present

## 2016-10-11 DIAGNOSIS — E039 Hypothyroidism, unspecified: Secondary | ICD-10-CM | POA: Diagnosis not present

## 2016-10-31 DIAGNOSIS — Z85820 Personal history of malignant melanoma of skin: Secondary | ICD-10-CM | POA: Diagnosis not present

## 2016-10-31 DIAGNOSIS — L738 Other specified follicular disorders: Secondary | ICD-10-CM | POA: Diagnosis not present

## 2016-10-31 DIAGNOSIS — L858 Other specified epidermal thickening: Secondary | ICD-10-CM | POA: Diagnosis not present

## 2016-10-31 DIAGNOSIS — D225 Melanocytic nevi of trunk: Secondary | ICD-10-CM | POA: Diagnosis not present

## 2016-10-31 DIAGNOSIS — L821 Other seborrheic keratosis: Secondary | ICD-10-CM | POA: Diagnosis not present

## 2017-01-02 ENCOUNTER — Other Ambulatory Visit (HOSPITAL_BASED_OUTPATIENT_CLINIC_OR_DEPARTMENT_OTHER): Payer: Medicare Other

## 2017-01-02 DIAGNOSIS — C439 Malignant melanoma of skin, unspecified: Secondary | ICD-10-CM

## 2017-01-02 DIAGNOSIS — I2510 Atherosclerotic heart disease of native coronary artery without angina pectoris: Secondary | ICD-10-CM | POA: Diagnosis not present

## 2017-01-02 DIAGNOSIS — Z9889 Other specified postprocedural states: Secondary | ICD-10-CM | POA: Diagnosis not present

## 2017-01-02 DIAGNOSIS — E7800 Pure hypercholesterolemia, unspecified: Secondary | ICD-10-CM | POA: Diagnosis not present

## 2017-01-02 DIAGNOSIS — I351 Nonrheumatic aortic (valve) insufficiency: Secondary | ICD-10-CM | POA: Diagnosis not present

## 2017-01-02 LAB — CBC WITH DIFFERENTIAL/PLATELET
BASO%: 0.8 % (ref 0.0–2.0)
Basophils Absolute: 0.1 10*3/uL (ref 0.0–0.1)
EOS%: 3 % (ref 0.0–7.0)
Eosinophils Absolute: 0.2 10*3/uL (ref 0.0–0.5)
HCT: 42.5 % (ref 38.4–49.9)
HGB: 14.3 g/dL (ref 13.0–17.1)
LYMPH%: 20.4 % (ref 14.0–49.0)
MCH: 32.3 pg (ref 27.2–33.4)
MCHC: 33.6 g/dL (ref 32.0–36.0)
MCV: 95.9 fL (ref 79.3–98.0)
MONO#: 0.8 10*3/uL (ref 0.1–0.9)
MONO%: 12 % (ref 0.0–14.0)
NEUT#: 4 10*3/uL (ref 1.5–6.5)
NEUT%: 63.8 % (ref 39.0–75.0)
Platelets: 166 10*3/uL (ref 140–400)
RBC: 4.43 10*6/uL (ref 4.20–5.82)
RDW: 13.6 % (ref 11.0–14.6)
WBC: 6.3 10*3/uL (ref 4.0–10.3)
lymph#: 1.3 10*3/uL (ref 0.9–3.3)

## 2017-01-02 LAB — COMPREHENSIVE METABOLIC PANEL
ALT: 20 U/L (ref 0–55)
AST: 32 U/L (ref 5–34)
Albumin: 3.6 g/dL (ref 3.5–5.0)
Alkaline Phosphatase: 64 U/L (ref 40–150)
Anion Gap: 5 mEq/L (ref 3–11)
BUN: 22.5 mg/dL (ref 7.0–26.0)
CO2: 30 mEq/L — ABNORMAL HIGH (ref 22–29)
Calcium: 9.5 mg/dL (ref 8.4–10.4)
Chloride: 107 mEq/L (ref 98–109)
Creatinine: 1.1 mg/dL (ref 0.7–1.3)
EGFR: 69 mL/min/{1.73_m2} — ABNORMAL LOW (ref >90)
Glucose: 73 mg/dl (ref 70–140)
Potassium: 4.3 mEq/L (ref 3.5–5.1)
Sodium: 142 mEq/L (ref 136–145)
Total Bilirubin: 0.7 mg/dL (ref 0.20–1.20)
Total Protein: 6.9 g/dL (ref 6.4–8.3)

## 2017-01-02 LAB — LACTATE DEHYDROGENASE: LDH: 140 U/L (ref 125–245)

## 2017-01-09 ENCOUNTER — Ambulatory Visit (HOSPITAL_BASED_OUTPATIENT_CLINIC_OR_DEPARTMENT_OTHER): Payer: Medicare Other | Admitting: Oncology

## 2017-01-09 VITALS — BP 128/64 | HR 59 | Temp 97.9°F | Resp 18 | Ht 68.0 in | Wt 162.6 lb

## 2017-01-09 DIAGNOSIS — Z85820 Personal history of malignant melanoma of skin: Secondary | ICD-10-CM | POA: Diagnosis not present

## 2017-01-09 DIAGNOSIS — C61 Malignant neoplasm of prostate: Secondary | ICD-10-CM

## 2017-01-09 DIAGNOSIS — C439 Malignant melanoma of skin, unspecified: Secondary | ICD-10-CM

## 2017-01-09 DIAGNOSIS — E559 Vitamin D deficiency, unspecified: Secondary | ICD-10-CM | POA: Diagnosis not present

## 2017-01-09 DIAGNOSIS — I2510 Atherosclerotic heart disease of native coronary artery without angina pectoris: Secondary | ICD-10-CM | POA: Diagnosis not present

## 2017-01-09 DIAGNOSIS — E039 Hypothyroidism, unspecified: Secondary | ICD-10-CM | POA: Diagnosis not present

## 2017-01-09 DIAGNOSIS — Z8546 Personal history of malignant neoplasm of prostate: Secondary | ICD-10-CM

## 2017-01-09 DIAGNOSIS — Z0000 Encounter for general adult medical examination without abnormal findings: Secondary | ICD-10-CM | POA: Diagnosis not present

## 2017-01-09 NOTE — Addendum Note (Signed)
Addended by: Randolm Idol on: 01/09/2017 10:44 AM   Modules accepted: Orders

## 2017-01-09 NOTE — Progress Notes (Signed)
Hematology and Oncology Follow Up Visit  DOCTOR SHEAHAN [id number] Sep 23, 1940 76 y.o. 01/09/2017 10:35 AM [name], MDKim, [name], MD   Principle Diagnosis: 76 year old gentleman with melanoma arising from a subungual right thumb. He is status post amputation and plastic reconstruction and axillary lymph node dissection on March 2017. The final pathology was T2a N0.   Prior Therapy:  His right thumb subungual melanoma was measured at 1.45 mm. Dr. Crisoforo Oxford performed right thumb amputation was planned on March 6 with lymph node dissection. That was completed without major complications and the final pathology revealed a 1.4 mm malignant melanoma in his right axillary lymph node dissection did not reveal any lymph node involvement indicating stage II disease with a final pathological staging was T2a N0.  Status post prostatectomy done at [facility]. The pathology revealed adenocarcinoma Gleason score 3+4 = 7 without any lymph node involvement. Final pathological staging was stage T2cN0  Current therapy: Observation and surveillance.  Interim History:  Mr. Flippen presents today for a follow-up visit. Since the last visit, he reports no recent complaints. He continues to follow with neurology regarding his issues and possibly early dementia. He does not take any medication at this time and would like to avoid it if possible. He denied any recent falls or syncope. He denied any skin masses or lesions. He denied any urinary symptoms, pain or pathological fractures.  He does not report any headaches, blurry vision, syncope or seizures. He does not report any fevers or chills or sweats. He does not report any cough, wheezing or hemoptysis. He does not report any nausea, vomiting or abdominal pain. He does not report any frequency urgency or hesitancy. He does not report any skeletal complaints. Remaining review of systems unremarkable.   Medications: I have reviewed the  patient's current medications.  Current Outpatient Prescriptions  Medication Sig Dispense Refill  . aspirin 81 MG tablet Take 81 mg by mouth daily.      . calcium-vitamin D (OSCAL WITH D) 500-200 MG-UNIT per tablet Take 2 tablets by mouth daily.      . Cholecalciferol (VITAMIN D3) 1000 UNITS CAPS Take 1 capsule by mouth daily.    . Coenzyme Q10 (CO Q 10) 100 MG CAPS Take 1 capsule by mouth daily.    . famotidine (PEPCID) 10 MG tablet Take 10 mg by mouth daily.     Marland Kitchen L-Methylfolate-B12-B6-B2 (CEREFOLIN PO) Take by mouth.    . losartan-hydrochlorothiazide (HYZAAR) 50-12.5 MG tablet Take 1 tablet by mouth daily.    . Magnesium 400 MG CAPS Take 1 capsule by mouth daily.    . nebivolol (BYSTOLIC) 10 MG tablet Take 10 mg by mouth daily.    . rivastigmine (EXELON) 1.5 MG capsule Take 1 capsule (1.5 mg total) by mouth 2 (two) times daily. 60 capsule 6   No current facility-administered medications for this visit.      Allergies:  Allergies  Allergen Reactions  . Penicillins Itching and Rash    Past Medical History, Surgical history, Social history, and Family History were reviewed and updated.  Physical Exam: Blood pressure 128/64, pulse (!) 59, temperature 97.9 F (36.6 C), temperature source Oral, resp. rate 18, height 5\' 8"  (1.727 m), weight 162 lb 9.6 oz (73.8 kg), SpO2 100 %. ECOG: 0 General appearance: Well-appearing gentleman without distress. Head: Normocephalic, without obvious abnormality oral ulcers or lesions. Neck: no adenopathy Lymph nodes: Cervical, supraclavicular, and axillary nodes normal. Heart:regular rate and rhythm, S1, S2 normal, no  murmur, click, rub or gallop Lung:chest clear, no wheezing, rales, normal symmetric air entry Abdomin: soft, non-tender, without masses or organomegaly. No shifting dullness or ascites. EXT:no erythema, induration, or nodules. Right partial thumb amputation noted.   Lab Results: Lab Results  Component Value Date   WBC 6.3  01/02/2017   HGB 14.3 01/02/2017   HCT 42.5 01/02/2017   MCV 95.9 01/02/2017   PLT 166 01/02/2017     Chemistry      Component Value Date/Time   NA 142 01/02/2017 1126   K 4.3 01/02/2017 1126   CL 104 05/01/2015 1145   CO2 30 (H) 01/02/2017 1126   BUN 22.5 01/02/2017 1126   CREATININE 1.1 01/02/2017 1126      Component Value Date/Time   CALCIUM 9.5 01/02/2017 1126   ALKPHOS 64 01/02/2017 1126   AST 32 01/02/2017 1126   ALT 20 01/02/2017 1126   BILITOT 0.70 01/02/2017 1126      Impression and Plan:   76 year old gentleman with the following issues:  1. 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 He is status post amputation and plastic reconstruction and axillary lymph node dissection on March 2017. The final pathology was T2a N0.  He is currently on active surveillance without any evidence to suggest recurrent disease.  His laboratory data and physical examination continues to be within normal range.  2. Prostate cancer: Status post prostatectomy without any evidence of recurrence at this time. It is unclear whether he is following with urology or not at this time. I will obtain a PSA within the next visit.  3. Follow-up: Will be in 6 months.    Zola Button, MD 9/27/201810:35 AM

## 2017-01-16 DIAGNOSIS — R413 Other amnesia: Secondary | ICD-10-CM | POA: Diagnosis not present

## 2017-01-16 DIAGNOSIS — E7800 Pure hypercholesterolemia, unspecified: Secondary | ICD-10-CM | POA: Diagnosis not present

## 2017-01-16 DIAGNOSIS — Z0000 Encounter for general adult medical examination without abnormal findings: Secondary | ICD-10-CM | POA: Diagnosis not present

## 2017-01-16 DIAGNOSIS — Z23 Encounter for immunization: Secondary | ICD-10-CM | POA: Diagnosis not present

## 2017-01-30 DIAGNOSIS — R413 Other amnesia: Secondary | ICD-10-CM | POA: Diagnosis not present

## 2017-01-31 DIAGNOSIS — Z85820 Personal history of malignant melanoma of skin: Secondary | ICD-10-CM | POA: Diagnosis not present

## 2017-01-31 DIAGNOSIS — L738 Other specified follicular disorders: Secondary | ICD-10-CM | POA: Diagnosis not present

## 2017-01-31 DIAGNOSIS — D225 Melanocytic nevi of trunk: Secondary | ICD-10-CM | POA: Diagnosis not present

## 2017-01-31 DIAGNOSIS — D2261 Melanocytic nevi of right upper limb, including shoulder: Secondary | ICD-10-CM | POA: Diagnosis not present

## 2017-02-05 DIAGNOSIS — Z952 Presence of prosthetic heart valve: Secondary | ICD-10-CM | POA: Diagnosis not present

## 2017-02-05 DIAGNOSIS — I351 Nonrheumatic aortic (valve) insufficiency: Secondary | ICD-10-CM | POA: Diagnosis not present

## 2017-02-13 DIAGNOSIS — R413 Other amnesia: Secondary | ICD-10-CM | POA: Diagnosis not present

## 2017-02-22 ENCOUNTER — Encounter: Payer: Self-pay | Admitting: Emergency Medicine

## 2017-02-22 ENCOUNTER — Other Ambulatory Visit: Payer: Self-pay

## 2017-02-22 ENCOUNTER — Ambulatory Visit (INDEPENDENT_AMBULATORY_CARE_PROVIDER_SITE_OTHER): Payer: Medicare Other | Admitting: Emergency Medicine

## 2017-02-22 VITALS — BP 110/62 | HR 77 | Temp 97.6°F | Resp 16 | Ht 67.0 in | Wt 155.6 lb

## 2017-02-22 DIAGNOSIS — J36 Peritonsillar abscess: Secondary | ICD-10-CM

## 2017-02-22 MED ORDER — METHYLPREDNISOLONE SODIUM SUCC 125 MG IJ SOLR
125.0000 mg | Freq: Once | INTRAMUSCULAR | Status: AC
  Administered 2017-02-22: 125 mg via INTRAMUSCULAR

## 2017-02-22 MED ORDER — AZITHROMYCIN 250 MG PO TABS: ORAL_TABLET | ORAL | 0 refills | Status: DC

## 2017-02-22 MED ORDER — PREDNISONE 20 MG PO TABS: 40.0000 mg | ORAL_TABLET | Freq: Every day | ORAL | 0 refills | Status: AC

## 2017-02-22 NOTE — Progress Notes (Signed)
[name] 76 y.o.   Chief Complaint  Patient presents with  . Cough    NONPRODUCTIVE  . Sore Throat    HISTORY OF PRESENT ILLNESS: This is a 76 y.o. male complaining of sore throat and cough x several days. Denies fever.  Sore Throat   This is a new problem. The current episode started in the past 7 days. The problem has been gradually worsening. There has been no fever. The pain is moderate. Associated symptoms include coughing, neck pain, swollen glands and trouble swallowing (painful swallowing). Pertinent negatives include no abdominal pain, congestion, diarrhea, ear pain, headaches, shortness of breath, stridor or vomiting.     Prior to Admission medications   Medication Sig Start Date End Date Taking? Authorizing Provider  aspirin 81 MG tablet Take 81 mg by mouth daily.     Yes [provider]  calcium-vitamin D (OSCAL WITH D) 500-200 MG-UNIT per tablet Take 2 tablets by mouth daily.     Yes [provider]  Cholecalciferol (VITAMIN D3) 1000 UNITS CAPS Take 1 capsule by mouth daily.   Yes [provider]  L-Methylfolate-B12-B6-B2 (CEREFOLIN PO) Take by mouth.   Yes [provider]  Magnesium 400 MG CAPS Take 1 capsule by mouth daily.   Yes [provider]  nebivolol (BYSTOLIC) 10 MG tablet Take 10 mg by mouth daily.   Yes [provider]  azithromycin (ZITHROMAX) 250 MG tablet Sig as indicated 02/22/17   Horald Pollen, MD  Coenzyme Q10 (CO Q 10) 100 MG CAPS Take 1 capsule by mouth daily.    [provider]  LIVALO 2 MG TABS  12/31/16   [provider]  losartan-hydrochlorothiazide (HYZAAR) 50-12.5 MG tablet Take 1 tablet by mouth daily. 11/27/15   [provider]  rivastigmine (EXELON) 1.5 MG capsule Take 1 capsule (1.5 mg total) by mouth 2 (two) times daily. 09/11/16   Dohmeier, Asencion Partridge, MD    Allergies  Allergen Reactions  . Penicillins Itching and Rash    Patient Active  Problem List   Diagnosis Date Noted  . 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 Sleep behavior disorder, REM 06/11/2016  . TIA due to embolism ([place]) 06/11/2016  . History of heart artery stent 06/11/2016  . Asthma, mild intermittent 11/29/2015  . Asthma, moderate persistent, well-controlled 08/08/2014  . Dyspnea 01/27/2014  . Asthma, moderate persistent, poorly-controlled 01/27/2014  . Hypotension, unspecified 07/15/2013  . Hypotension 07/15/2013  . Dyspnea and respiratory abnormality 07/13/2013  . S/P MVR (mitral valve repair) 01/14/2011  . HTN (hypertension)   . GERD (gastroesophageal reflux disease)   . Osteoporosis   . CAD (coronary artery disease)   . Mitral valve prolapse with severe mitral regurgitation   . DIAPHRAGMATIC DISORDER 02/09/2010  . HYPERTENSION 02/05/2010  . OSTEOPOROSIS 02/05/2010  . CARDIAC MURMUR, SYSTOLIC 10/09/9483  . COUGH 02/05/2010    Past Medical History:  Diagnosis Date  . Asthma   . CAD (coronary artery disease)   . Cancer [facility]) 2016   prostate  . Complication of anesthesia    CO2 high according to patient   . GERD (gastroesophageal reflux disease)   . HTN (hypertension)   . Mitral valve prolapse   . Osteoporosis   . TIA (transient ischemic attack)   . UTI (lower urinary tract infection)     Past Surgical History:  Procedure Laterality Date  . APPENDECTOMY    . CORONARY ANGIOPLASTY WITH STENT PLACEMENT  04/30/2010  PCI and stenting of proximal RCA  . MITRAL VALVE REPAIR  06/13/2010   right mini thoracotomy for complex valvuloplasty with 2mm Memo ring annuloplasty - Dr. Roxy Manns  . PROSTATECTOMY  2016    Social History   Socioeconomic History  . Marital status: Married    Spouse name: Not on file  . Number of children: 2  . Years of education: MS  . Highest education level: Not on file  Social Needs  . Financial resource strain: Not on file  . Food insecurity - worry: Not on file  . Food  insecurity - inability: Not on file  . Transportation needs - medical: Not on file  . Transportation needs - non-medical: Not on file  Occupational History  . Occupation: retired    Fish farm manager: RETIRED  Tobacco Use  . Smoking status: Never Smoker  . Smokeless tobacco: Never Used  Substance and Sexual Activity  . Alcohol use: Yes    Comment: SOCIAL  . Drug use: No  . Sexual activity: Not on file  Other Topics Concern  . Not on file  Social History Narrative   Patient drinks coffee daily     Family History  Problem Relation Age of Onset  . Heart disease Father   . Heart disease Mother      Review of Systems  Constitutional: Negative.  Negative for chills and fever.  HENT: Positive for sore throat and trouble swallowing (painful swallowing). Negative for congestion and ear pain.   Eyes: Negative.   Respiratory: Positive for cough. Negative for hemoptysis, shortness of breath, wheezing and stridor.   Cardiovascular: Negative.  Negative for chest pain and palpitations.  Gastrointestinal: Negative for abdominal pain, diarrhea, nausea and vomiting.  Genitourinary: Negative.  Negative for dysuria and hematuria.  Musculoskeletal: Positive for neck pain.  Skin: Negative.  Negative for rash.  Neurological: Negative.  Negative for dizziness and headaches.  Endo/Heme/Allergies: Negative.   All other systems reviewed and are negative.  Vitals:   02/22/17 1154  BP: 110/62  Pulse: 77  Resp: 16  Temp: 97.6 F (36.4 C)  SpO2: 97%     Physical Exam  Constitutional: He is oriented to person, place, and time. He appears well-developed and well-nourished.  HENT:  Head: Normocephalic and atraumatic.  Nose: Nose normal.  Mouth/Throat: Uvula is midline. Posterior oropharyngeal edema, posterior oropharyngeal erythema and tonsillar abscesses present.    Eyes: Conjunctivae and EOM are normal. Pupils are equal, round, and reactive to light.  Neck: Normal range of motion. Neck supple.    Cardiovascular: Normal rate and regular rhythm.  Murmur (4/6 systolic, aortic area) heard. Pulmonary/Chest: Effort normal and breath sounds normal.  Abdominal: Soft. There is no tenderness.  Musculoskeletal: Normal range of motion. He exhibits no edema or tenderness.  Lymphadenopathy:    He has cervical adenopathy.  Neurological: He is alert and oriented to person, place, and time. No sensory deficit. He exhibits normal muscle tone.  Skin: Skin is warm and dry. Capillary refill takes less than 2 seconds. No rash noted.  Psychiatric: He has a normal mood and affect. His behavior is normal.  Vitals reviewed.    ASSESSMENT & PLAN: Elijha was seen today for cough and sore throat.  Diagnoses and all orders for this visit:  Peritonsillar abscess -     methylPREDNISolone sodium succinate (SOLU-MEDROL) 125 mg/2 mL injection 125 mg -     Ambulatory referral to ENT  Other orders -     azithromycin (ZITHROMAX) 250 MG tablet;  Sig as indicated -     predniSONE (DELTASONE) 20 MG tablet; Take 2 tablets (40 mg total) daily with breakfast for 5 days by mouth.    Patient Instructions       IF you received an x-ray today, you will receive an invoice from [facility]. Please contact [facility] at [phone] with questions or concerns regarding your invoice.   IF you received labwork today, you will receive an invoice from [facility]. Please contact [facility] at [phone] with questions or concerns regarding your invoice.   Our billing staff will not be able to assist you with questions regarding bills from these companies.  You will be contacted with the lab results as soon as they are available. The fastest way to get your results is to activate your My Chart account. Instructions are located on the last page of this paperwork. If you have not heard from [place] regarding the results in 2 weeks, please contact this office.     Peritonsillar Abscess A peritonsillar  abscess is a collection of yellowish-white fluid (pus) in the back of the throat. It forms behind the tonsils. Treatment usually involves draining the fluid. This may be done by:  Putting a needle into the abscess.  Cutting and draining the abscess.  Follow these instructions at home:  Rest as much as you can.  Take medicines only as told by your doctor.  If you were given an antibiotic medicine, finish it all even if you start to feel better.  If your abscess was drained by your doctor: ? Mix 1 teaspoon of salt in 8 ounces of warm water for gargling. ? Gargle 4 times per day or as needed for comfort. ? Do not swallow this mixture.  Drink a lot of fluids.  Eat soft or liquid foods while your throat is sore. Frozen ice pops and ice cream are good choices.  Keep all follow-up visits as told by your doctor. This is important. Contact a doctor if:  You have more pain, swelling, redness, or drainage in your throat.  You have a headache, have low energy, or feel sick.  You have a fever.  You feel dizzy.  You have trouble swallowing or eating.  You have signs of body fluid loss (dehydration): ? Light-headedness when you are standing. ? Peeing (urinating) less. ? A fast heart rate. ? Dry mouth. Get help right away if:  You have trouble talking or breathing.  You find it easier to breathe when you lean forward.  You are coughing up blood or throwing up (vomiting) blood.  You have severe throat pain that is not helped by medicines.  You start to drool. This information is not intended to replace advice given to you by your health care provider. Make sure you discuss any questions you have with your health care provider. Document Released: 03/20/2009 Document Revised: 09/07/2015 Document Reviewed: 11/15/2013 Elsevier Interactive Patient Education  2018 Elsevier Inc.      Agustina Caroli, MD [facility]

## 2017-02-22 NOTE — Patient Instructions (Addendum)
     IF you received an x-ray today, you will receive an invoice from [facility]. Please contact [facility] at [phone] with questions or concerns regarding your invoice.   IF you received labwork today, you will receive an invoice from [facility]. Please contact [facility] at [phone] with questions or concerns regarding your invoice.   Our billing staff will not be able to assist you with questions regarding bills from these companies.  You will be contacted with the lab results as soon as they are available. The fastest way to get your results is to activate your My Chart account. Instructions are located on the last page of this paperwork. If you have not heard from [place] regarding the results in 2 weeks, please contact this office.     Peritonsillar Abscess A peritonsillar abscess is a collection of yellowish-white fluid (pus) in the back of the throat. It forms behind the tonsils. Treatment usually involves draining the fluid. This may be done by:  Putting a needle into the abscess.  Cutting and draining the abscess.  Follow these instructions at home:  Rest as much as you can.  Take medicines only as told by your doctor.  If you were given an antibiotic medicine, finish it all even if you start to feel better.  If your abscess was drained by your doctor: ? Mix 1 teaspoon of salt in 8 ounces of warm water for gargling. ? Gargle 4 times per day or as needed for comfort. ? Do not swallow this mixture.  Drink a lot of fluids.  Eat soft or liquid foods while your throat is sore. Frozen ice pops and ice cream are good choices.  Keep all follow-up visits as told by your doctor. This is important. Contact a doctor if:  You have more pain, swelling, redness, or drainage in your throat.  You have a headache, have low energy, or feel sick.  You have a fever.  You feel dizzy.  You have trouble swallowing or eating.  You have signs of body fluid loss  (dehydration): ? Light-headedness when you are standing. ? Peeing (urinating) less. ? A fast heart rate. ? Dry mouth. Get help right away if:  You have trouble talking or breathing.  You find it easier to breathe when you lean forward.  You are coughing up blood or throwing up (vomiting) blood.  You have severe throat pain that is not helped by medicines.  You start to drool. This information is not intended to replace advice given to you by your health care provider. Make sure you discuss any questions you have with your health care provider. Document Released: 03/20/2009 Document Revised: 09/07/2015 Document Reviewed: 11/15/2013 Elsevier Interactive Patient Education  2018 [employer].

## 2017-02-25 ENCOUNTER — Telehealth: Payer: Self-pay | Admitting: *Deleted

## 2017-02-25 DIAGNOSIS — R413 Other amnesia: Secondary | ICD-10-CM | POA: Diagnosis not present

## 2017-02-25 NOTE — Telephone Encounter (Signed)
Copied from [place] [id number]. Topic: General - Other >> Feb 24, 2017 11:38 AM Darl Householder, RMA wrote: Reason for CRM: patient is requesting blood work to be done to check blood count, pt is requesting a call back from Dr. Mitchel Honour CMA please

## 2017-02-25 NOTE — Telephone Encounter (Signed)
Spoke with patient he states he did not want blood work and will follow up at a later date.

## 2017-02-26 DIAGNOSIS — H9193 Unspecified hearing loss, bilateral: Secondary | ICD-10-CM | POA: Diagnosis not present

## 2017-02-26 DIAGNOSIS — Z89011 Acquired absence of right thumb: Secondary | ICD-10-CM | POA: Diagnosis not present

## 2017-02-26 DIAGNOSIS — R490 Dysphonia: Secondary | ICD-10-CM | POA: Insufficient documentation

## 2017-02-26 DIAGNOSIS — Z7289 Other problems related to lifestyle: Secondary | ICD-10-CM | POA: Diagnosis not present

## 2017-02-26 DIAGNOSIS — J387 Other diseases of larynx: Secondary | ICD-10-CM | POA: Diagnosis not present

## 2017-03-05 DIAGNOSIS — R413 Other amnesia: Secondary | ICD-10-CM | POA: Diagnosis not present

## 2017-03-05 DIAGNOSIS — E7800 Pure hypercholesterolemia, unspecified: Secondary | ICD-10-CM | POA: Diagnosis not present

## 2017-03-17 ENCOUNTER — Ambulatory Visit: Payer: Medicare Other | Admitting: Neurology

## 2017-03-17 ENCOUNTER — Encounter: Payer: Self-pay | Admitting: Neurology

## 2017-03-17 VITALS — BP 116/69 | HR 66 | Ht 70.0 in | Wt 160.0 lb

## 2017-03-17 DIAGNOSIS — F03B18 Unspecified dementia, moderate, with other behavioral disturbance: Secondary | ICD-10-CM | POA: Insufficient documentation

## 2017-03-17 DIAGNOSIS — F0280 Dementia in other diseases classified elsewhere without behavioral disturbance: Secondary | ICD-10-CM | POA: Diagnosis not present

## 2017-03-17 DIAGNOSIS — F0391 Unspecified dementia with behavioral disturbance: Secondary | ICD-10-CM | POA: Insufficient documentation

## 2017-03-17 MED ORDER — DONEPEZIL HCL 5 MG PO TABS: 5.0000 mg | ORAL_TABLET | Freq: Every day | ORAL | 5 refills | Status: DC

## 2017-03-17 MED ORDER — OMEPRAZOLE 20 MG PO CPDR: 20.0000 mg | DELAYED_RELEASE_CAPSULE | Freq: Every day | ORAL | 0 refills | Status: DC
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
## 2017-03-17 NOTE — Patient Instructions (Signed)
Dementia Dementia means losing some of your brain ability. People with dementia may have problems with:  Memory.  Making decisions.  Behavior.  Speaking.  Thinking.  Solving problems.  Follow these instructions at home: Medicine  Take over-the-counter and prescription medicines only as told by your doctor.  Avoid taking medicines that can change how you think. These include pain or sleeping medicines. Lifestyle   Make healthy choices: ? Be active as told by your doctor. ? Do not use any tobacco products, such as cigarettes, chewing tobacco, and e-cigarettes. If you need help quitting, ask your doctor. ? Eat a healthy diet. ? When you get stressed, do something to help yourself relax. Your doctor can give you tips. ? Spend time with other people.  Drink enough fluid to keep your pee (urine) clear or pale yellow.  Make sure you get good sleep. Use these tips to help you get a good night's rest: ? Try not to take naps during the day. ? Keep your sleeping area dark and cool. ? In the few hours before you go to bed, try not to do any exercise. ? Try not to have foods and drinks with caffeine in the evening. General instructions  Talk with your doctor to figure out: ? What you need help with. ? What your safety needs are.  If you were given a bracelet that tracks your location, make sure to wear it.  Keep all follow-up visits as told by your doctor. This is important. Contact a doctor if:  You have any new problems.  You have problems with choking or swallowing.  You have any symptoms of a different sickness. Get help right away if:  You have a fever.  You feel mixed up (confused) or more mixed up than before.  You have new sleepiness.  You have sleepiness that gets worse.  You have a hard time staying awake.  You or your family members are worried for your safety. This information is not intended to replace advice given to you by your health care  provider. Make sure you discuss any questions you have with your health care provider. Document Released: 03/14/2008 Document Revised: 09/07/2015 Document Reviewed: 12/28/2014 Elsevier Interactive Patient Education  2018 [employer]  

## 2017-04-11 DIAGNOSIS — R413 Other amnesia: Secondary | ICD-10-CM | POA: Diagnosis not present

## 2017-04-11 DIAGNOSIS — I2510 Atherosclerotic heart disease of native coronary artery without angina pectoris: Secondary | ICD-10-CM | POA: Diagnosis not present

## 2017-04-11 DIAGNOSIS — E7800 Pure hypercholesterolemia, unspecified: Secondary | ICD-10-CM | POA: Diagnosis not present

## 2017-05-29 DIAGNOSIS — R413 Other amnesia: Secondary | ICD-10-CM | POA: Diagnosis not present

## 2017-05-29 DIAGNOSIS — R195 Other fecal abnormalities: Secondary | ICD-10-CM | POA: Diagnosis not present

## 2017-05-29 DIAGNOSIS — I10 Essential (primary) hypertension: Secondary | ICD-10-CM | POA: Diagnosis not present

## 2017-07-08 ENCOUNTER — Other Ambulatory Visit: Payer: Self-pay | Admitting: Neurology

## 2017-07-08 MED ORDER — RIVASTIGMINE TARTRATE 1.5 MG PO CAPS: 1.5000 mg | ORAL_CAPSULE | Freq: Two times a day (BID) | ORAL | 2 refills | Status: DC

## 2017-07-16 ENCOUNTER — Encounter: Payer: Self-pay | Admitting: Adult Health

## 2017-07-16 ENCOUNTER — Ambulatory Visit: Payer: Medicare Other | Admitting: Adult Health

## 2017-07-16 VITALS — BP 135/76 | HR 57 | Ht 70.0 in | Wt 158.2 lb

## 2017-07-16 DIAGNOSIS — R413 Other amnesia: Secondary | ICD-10-CM

## 2017-07-16 NOTE — Patient Instructions (Signed)
Your Plan:  Continue Exelon capsules Look to see if namenda was started Will continue to monitor memory If your symptoms worsen or you develop new symptoms please let us know.   Thank you for coming to see [place] at [facility]. I hope we have been able to provide you high quality care today.  You may receive a patient satisfaction survey over the next few weeks. We would appreciate your feedback and comments so that we may continue to improve ourselves and the health of our patients.
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
## 2017-07-29 DIAGNOSIS — R413 Other amnesia: Secondary | ICD-10-CM | POA: Diagnosis not present

## 2017-07-29 DIAGNOSIS — E559 Vitamin D deficiency, unspecified: Secondary | ICD-10-CM | POA: Diagnosis not present

## 2017-07-29 DIAGNOSIS — E039 Hypothyroidism, unspecified: Secondary | ICD-10-CM | POA: Diagnosis not present

## 2017-07-29 DIAGNOSIS — I2510 Atherosclerotic heart disease of native coronary artery without angina pectoris: Secondary | ICD-10-CM | POA: Diagnosis not present

## 2017-07-31 DIAGNOSIS — D2261 Melanocytic nevi of right upper limb, including shoulder: Secondary | ICD-10-CM | POA: Diagnosis not present

## 2017-07-31 DIAGNOSIS — D225 Melanocytic nevi of trunk: Secondary | ICD-10-CM | POA: Diagnosis not present

## 2017-07-31 DIAGNOSIS — D224 Melanocytic nevi of scalp and neck: Secondary | ICD-10-CM | POA: Diagnosis not present

## 2017-07-31 DIAGNOSIS — L84 Corns and callosities: Secondary | ICD-10-CM | POA: Diagnosis not present

## 2017-07-31 DIAGNOSIS — Z85820 Personal history of malignant melanoma of skin: Secondary | ICD-10-CM | POA: Diagnosis not present

## 2017-08-27 DIAGNOSIS — I10 Essential (primary) hypertension: Secondary | ICD-10-CM | POA: Diagnosis not present

## 2017-08-27 DIAGNOSIS — R413 Other amnesia: Secondary | ICD-10-CM | POA: Diagnosis not present

## 2017-10-08 ENCOUNTER — Other Ambulatory Visit: Payer: Medicare Other

## 2017-10-08 ENCOUNTER — Encounter: Payer: Medicare Other | Admitting: Genetic Counselor

## 2017-10-22 ENCOUNTER — Inpatient Hospital Stay: Payer: Medicare Other

## 2017-10-22 ENCOUNTER — Encounter: Payer: Self-pay | Admitting: Genetic Counselor

## 2017-10-22 ENCOUNTER — Inpatient Hospital Stay: Payer: Medicare Other | Attending: Genetic Counselor | Admitting: Genetic Counselor

## 2017-10-22 DIAGNOSIS — Z85820 Personal history of malignant melanoma of skin: Secondary | ICD-10-CM | POA: Diagnosis not present

## 2017-10-22 DIAGNOSIS — Z8546 Personal history of malignant neoplasm of prostate: Secondary | ICD-10-CM | POA: Insufficient documentation

## 2017-10-22 DIAGNOSIS — Z8041 Family history of malignant neoplasm of ovary: Secondary | ICD-10-CM | POA: Insufficient documentation

## 2017-10-22 NOTE — Progress Notes (Signed)
REFERRING PROVIDER: [name], MD [address] [place], [place] [address]  PRIMARY PROVIDER:  [name], MD  PRIMARY REASON FOR VISIT:  1. Family history of ovarian cancer      HISTORY OF PRESENT ILLNESS:   [name], a 77 y.o. male, was seen for a [place] cancer genetics consultation at the request of Dr. Maudie Mercury due to a personal and family history of cancer.  [name] presents to clinic today to discuss the possibility of a hereditary predisposition to cancer, genetic testing, and to further clarify his future cancer risks, as well as potential cancer risks for family members.   In 2016, at the age of 39, [name] was diagnosed with cancer of the prostate. This was treated with surgery. While being treated for prostate cancer, dark streaks under the right thumbnail were biopsied and found to be malignant melanoma.     CANCER HISTORY:   No history exists.      Past Medical History:  Diagnosis Date  . Asthma   . CAD (coronary artery disease)   . Cancer [facility]) 2016   prostate  . Complication of anesthesia    CO2 high according to patient   . Family history of ovarian cancer   . GERD (gastroesophageal reflux disease)   . History of melanoma   . History of prostate cancer   . HTN (hypertension)   . Mitral valve prolapse   . Osteoporosis   . TIA (transient ischemic attack)   . UTI (lower urinary tract infection)     Past Surgical History:  Procedure Laterality Date  . APPENDECTOMY    . CORONARY ANGIOPLASTY WITH STENT PLACEMENT  04/30/2010   PCI and stenting of proximal RCA  . MITRAL VALVE REPAIR  06/13/2010   right mini thoracotomy for complex valvuloplasty with 51m Memo ring annuloplasty - Dr. ORoxy Manns . NAILBED REPAIR Right 05/02/2015   Procedure: BIOPSY NAIL BED RIGHT THUMB;  Surgeon: GDaryll Brod MD;  Location: [facility]  Service: Orthopedics;  Laterality: Right;  ANESTHESIA:  IV REGIONAL FAB  . PROSTATECTOMY  2016     Social History   Socioeconomic History  . Marital status: Married    Spouse name: Not on file  . Number of children: 2  . Years of education: MS  . Highest education level: Not on file  Occupational History  . 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 Financial resource strain: Not on file  . Food insecurity:    Worry: Not on file    Inability: Not on file  . Transportation needs:    Medical: Not on file    Non-medical: Not on file  Tobacco Use  . Smoking status: Never Smoker  . Smokeless tobacco: Never Used  Substance and Sexual Activity  . Alcohol use: Yes    Comment: SOCIAL  . Drug use: No  . Sexual activity: Not on file  Lifestyle  . Physical activity:    Days per week: Not on file    Minutes per session: Not on file  . Stress: Not on file  Relationships  . Social connections:    Talks on phone: Not on file    Gets together: Not on file    Attends religious service: Not on file    Active member of club or organization: Not on file    Attends meetings of clubs or organizations: Not on file    Relationship status: Not on file  Other Topics Concern  . Not on file  Social History Narrative   Patient drinks coffee daily      FAMILY HISTORY:  We obtained a detailed, 4-generation family history.  Significant diagnoses are listed below: Family History  Problem Relation Age of Onset  . Heart disease Father   . Heart disease Mother   . Cirrhosis Brother   . Ulcers Brother   . Ovarian cancer Other        d. 1    The patient has two daughters who are cancer free.  He has three full siblings and three paternal half siblings who are cancer free.  One full brother died of cirrhosis of the liver.  His daughter was diagnosed with ovarian cancer and died at 70.  Both of his parents are deceased.  The patient's mother died in her late 76'B from complications of diabetes and heart disease.  She has multiple siblings who did not have cancer.  Her  parents died of old age.  The patient's father died of old age.  He had five sisters who did not have cancer.  His parents died from possible old age.  [name] is unaware of previous family history of genetic testing for hereditary cancer risks. Patient's maternal ancestors are of [place] descent, and paternal ancestors are of [place] descent. There is no reported Ashkenazi Jewish ancestry. There is no known consanguinity.  GENETIC COUNSELING ASSESSMENT: KIANO TERRIEN is a 77 y.o. male with a personal history of prostate cancer and melanoma and family history of ovarian cancer which is somewhat suggestive of a hereditary cancer syndrome and predisposition to cancer. We, therefore, discussed and recommended the following at today's visit.   DISCUSSION: We discussed that bout 10-15% of prostate cancer cases are due to hereditary causes, most commonly BRCA mutations.  There are other genes that are associated with hereditary mutations.  The diagnosis of subungual melanoma is concerning for a hereditary melanoma gene.  The most common gene associated with hereditary melanoma is CDKN2A, although there are other genes including BAP1, MITF and CDK4. We reviewed the characteristics, features and inheritance patterns of hereditary cancer syndromes. We also discussed genetic testing, including the appropriate family members to test, the process of testing, insurance coverage and turn-around-time for results. We discussed the implications of a negative, positive and/or variant of uncertain significant result. We recommended Mr. Buice pursue genetic testing for the multi-cancer gene panel. The Multi-Gene Panel offered by [facility] includes sequencing and/or deletion duplication testing of the following 83 genes: ALK, APC, ATM, AXIN2,BAP1,  BARD1, BLM, BMPR1A, BRCA1, BRCA2, BRIP1, CASR, CDC73, CDH1, CDK4, CDKN1B, CDKN1C, CDKN2A (p14ARF), CDKN2A (p16INK4a), CEBPA, CHEK2, CTNNA1, DICER1, DIS3L2,  EGFR (c.2369C>T, p.Thr790Met variant only), EPCAM (Deletion/duplication testing only), FH, FLCN, GATA2, GPC3, GREM1 (Promoter region deletion/duplication testing only), HOXB13 (c.251G>A, p.Gly84Glu), HRAS, KIT, MAX, MEN1, MET, MITF (c.952G>A, p.Glu318Lys variant only), MLH1, MSH2, MSH3, MSH6, MUTYH, NBN, NF1, NF2, NTHL1, PALB2, PDGFRA, PHOX2B, PMS2, POLD1, POLE, POT1, PRKAR1A, PTCH1, PTEN, RAD50, RAD51C, RAD51D, RB1, RECQL4, RET, RUNX1, SDHAF2, SDHA (sequence changes only), SDHB, SDHC, SDHD, SMAD4, SMARCA4, SMARCB1, SMARCE1, STK11, SUFU, TERT, TERT, TMEM127, TP53, TSC1, TSC2, VHL, WRN and WT1.    Based on Mr. Johnsen's personal and family history of cancer, he meets medical criteria for genetic testing. Despite that he meets criteria, he may still have an out of pocket cost. We discussed that if his out of pocket cost for testing is over $100, the laboratory will call and confirm whether  he wants to proceed with testing.  If the out of pocket cost of testing is less than $100 he will be billed by the genetic testing laboratory.   PLAN: After considering the risks, benefits, and limitations, Mr. Arakawa  provided informed consent to pursue genetic testing and the blood sample was sent to [facility] for analysis of the Multi cancer gene panel. Results should be available within approximately 2-3 weeks' time, at which point they will be disclosed by telephone to Mr. Ferger, as will any additional recommendations warranted by these results. Mr. Mcmann will receive a summary of his genetic counseling visit and a copy of his results once available. This information will also be available in Epic. We encouraged Mr. Martorana to remain in contact with cancer genetics annually so that we can continuously update the family history and inform him of any changes in cancer genetics and testing that may be of benefit for his family. Mr. Matsuo questions were answered to his satisfaction today. Our contact  information was provided should additional questions or concerns arise.  Lastly, we encouraged Mr. Gondek to remain in contact with cancer genetics annually so that we can continuously update the family history and inform him of any changes in cancer genetics and testing that may be of benefit for this family.   Mr.  Berthold questions were answered to his satisfaction today. Our contact information was provided should additional questions or concerns arise. Thank you for the referral and allowing [place] to share in the care of your patient.   Shernell Saldierna P. Florene [place], [place], [facility] Certified Genetic Counselor [email or web address] phone: [phone]  The patient was seen for a total of 60 minutes in face-to-face genetic counseling.  This patient was discussed with Drs. Magrinat, Lindi Adie and/or Burr Medico who agrees with the above.    _______________________________________________________________________ For Office Staff:  Number of people involved in session: 2 Was an Intern/ student involved with case: no

## 2017-10-22 NOTE — Progress Notes (Deleted)
REFERRING PROVIDER: [name], MD [address] [place], [place] [address]  PRIMARY PROVIDER:  [name], MD  PRIMARY REASON FOR VISIT:  1. Family history of ovarian cancer      HISTORY OF PRESENT ILLNESS:   [name], a 77 y.o. male, was seen for a [facility] cancer genetics consultation at the request of Dr. Maudie Mercury due to a {Personal/family:20331} history of cancer.  [name] presents to clinic today to discuss the possibility of a hereditary predisposition to cancer, genetic testing, and to further clarify his future cancer risks, as well as potential cancer risks for family members.   In ***, at the age of ***, [name] was diagnosed with {CA PATHOLOGY:63853} of the {right left (wildcard):15202} {CA ZYSAY:30160}. This was treated with {CA TX:62360}.    *** [name] is a 77 y.o. male with no personal history of cancer.    CANCER HISTORY:   No history exists.     HORMONAL RISK FACTORS:  Menarche was at age ***.  First live birth at age ***.  OCP use for approximately {Numbers 1-12 multi-select:20307} years.  Ovaries intact: {Yes/No-Ex:120004}.  Hysterectomy: {Yes/No-Ex:120004}.  Menopausal status: {Menopause:31378}.  HRT use: {Numbers 1-12 multi-select:20307} years. Colonoscopy: {Yes/No-Ex:120004}; {normal/abnormal/not examined:14677}. Mammogram within the last year: {Yes/No-Ex:120004}. Number of breast biopsies: {Numbers 1-12 multi-select:20307}. Up to date with pelvic exams:  {Yes/No-Ex:120004}. Any excessive radiation exposure in the past:  {Yes/No-Ex:120004}  Past Medical History:  Diagnosis Date  . Asthma   . CAD (coronary artery disease)   . Cancer [facility]) 2016   prostate  . Complication of anesthesia    CO2 high according to patient   . Family history of ovarian cancer   . GERD (gastroesophageal reflux disease)   . HTN (hypertension)   . Mitral valve prolapse   . Osteoporosis   . TIA (transient ischemic attack)   . UTI (lower  urinary tract infection)     Past Surgical History:  Procedure Laterality Date  . APPENDECTOMY    . CORONARY ANGIOPLASTY WITH STENT PLACEMENT  04/30/2010   PCI and stenting of proximal RCA  . MITRAL VALVE REPAIR  06/13/2010   right mini thoracotomy for complex valvuloplasty with 63mm Memo ring annuloplasty - Dr. Roxy Manns  . NAILBED REPAIR Right 05/02/2015   Procedure: BIOPSY NAIL BED RIGHT THUMB;  Surgeon: Daryll Brod, MD;  Location: [facility];  Service: Orthopedics;  Laterality: Right;  ANESTHESIA:  IV REGIONAL FAB  . PROSTATECTOMY  2016    Social History   Socioeconomic History  . Marital status: Married    Spouse name: Not on file  . Number of children: 2  . Years of education: MS  . Highest education level: Not on file  Occupational History  . Occupation: retired    Fish farm manager: RETIRED  Social Needs  . Financial resource strain: Not on file  . Food insecurity:    Worry: Not on file    Inability: Not on file  . Transportation needs:    Medical: Not on file    Non-medical: Not on file  Tobacco Use  . Smoking status: Never Smoker  . Smokeless tobacco: Never Used  Substance and Sexual Activity  . Alcohol use: Yes    Comment: SOCIAL  . Drug use: No  . Sexual activity: Not on file  Lifestyle  . Physical activity:    Days per week: Not on file    Minutes per session: Not on file  . Stress: Not on file  Relationships  . Social connections:    Talks on phone: Not on file    Gets together: Not on file    Attends religious service: Not on file    Active member of club or organization: Not on file    Attends meetings of clubs or organizations: Not on file    Relationship status: Not on file  Other Topics Concern  . Not on file  Social History Narrative   Patient drinks coffee daily      FAMILY HISTORY:  We obtained a detailed, 4-generation family history.  Significant diagnoses are listed below: Family History  Problem Relation Age of Onset  . Heart  disease Father   . Heart disease Mother   . Cirrhosis Brother   . Ulcers Brother   . Ovarian cancer Other        d. 25    Mr. Vezina is {aware/unaware} of previous family history of genetic testing for hereditary cancer risks. Patient's maternal ancestors are of *** descent, and paternal ancestors are of *** descent. There {IS NO:12509} reported Ashkenazi Jewish ancestry. There {IS NO:12509} known consanguinity.  GENETIC COUNSELING ASSESSMENT: [name] is a 77 y.o. male with a {PERSONAL HISTORY} which is somewhat suggestive of a {DISEASE} and predisposition to cancer. We, therefore, discussed and recommended the following at today's visit.   DISCUSSION: We reviewed the characteristics, features and inheritance patterns of hereditary cancer syndromes. We also discussed genetic testing, including the appropriate family members to test, the process of testing, insurance coverage and turn-around-time for results. We discussed the implications of a negative, positive and/or variant of uncertain significant result. We recommended [name] pursue genetic testing for the *** gene panel.   Based on [name]'s {Personal/family:20331} history of cancer, he meets medical criteria for genetic testing. Despite that he meets criteria, he may still have an out of pocket cost. We discussed that if his out of pocket cost for testing is over $100, the laboratory will call and confirm whether he wants to proceed with testing.  If the out of pocket cost of testing is less than $100 he will be billed by the genetic testing laboratory.   ***We reviewed the characteristics, features and inheritance patterns of hereditary cancer syndromes. We also discussed genetic testing, including the appropriate family members to test, the process of testing, insurance coverage and turn-around-time for results. We discussed the implications of a negative, positive and/or variant of uncertain significant result. In  order to get genetic test results in a timely manner so that [name] can use these genetic test results for surgical decisions, we recommended [name] pursue genetic testing for the ***. If this test is negative, we then recommend [name] pursue reflex genetic testing to the *** gene panel.   Based on [name]'s {Personal/family:20331} history of cancer, he meets medical criteria for genetic testing. Despite that he meets criteria, he may still have an out of pocket cost. We discussed that if his out of pocket cost for testing is over $100, the laboratory will call and confirm whether he wants to proceed with testing.  If the out of pocket cost of testing is less than $100 he will be billed by the genetic testing laboratory.   ***We discussed with [name] that the family history is not highly consistent with a familial hereditary cancer syndrome, and we feel he is at low risk to harbor a gene mutation associated with such a condition. Thus, we did not recommend  any genetic testing, at this time, and recommended [name] continue to follow the cancer screening guidelines given by his primary healthcare provider.  ***In order to estimate his chance of having a {CA GENE:62345} mutation, we used statistical models ({GENMODELS:62370}) and laboratory data that take into account his personal medical history, family history and ancestry.  Because each model is different, there can be a lot of variability in the risks they give.  Therefore, these numbers must be considered a rough range and not a precise risk of having a {CA GENE:62345} mutation.  These models estimate that she has approximately a ***-***% chance of having a mutation. Based on this assessment of her family and personal history, genetic testing {IS/ISNOT:34056} recommended.  ***Based on the patient's personal and family history, statistical models ({GENMODELS:62370})  and literature data were used to estimate his risk of  developing {CA HX:54794}. These estimate his lifetime risk of developing {CA HX:54794} to be approximately ***% to ***%. This estimation does not take into account any genetic testing results.  The patient's lifetime breast cancer risk is a preliminary estimate based on available information using one of several models endorsed by the [place] (ACS). The ACS recommends consideration of breast MRI screening as an adjunct to mammography for patients at high risk (defined as 20% or greater lifetime risk). A more detailed breast cancer risk assessment can be considered, if clinically indicated.   ***Mr. Colden has been determined to be at high risk for breast cancer.  Therefore, we recommend that annual screening with mammography and breast MRI begin at age 13, or 10 years prior to the age of breast cancer diagnosis in a relative (whichever is earlier).  We discussed that [name] should discuss her individual situation with her referring physician and determine a breast cancer screening plan with which they are both comfortable.    PLAN: After considering the risks, benefits, and limitations, [name]  provided informed consent to pursue genetic testing and the blood sample was sent to {Lab} Laboratories for analysis of the {test}. Results should be available within approximately {TAT TIME} weeks' time, at which point they will be disclosed by telephone to [name], as will any additional recommendations warranted by these results. [name] will receive a summary of his genetic counseling visit and a copy of his results once available. This information will also be available in Epic. We encouraged [name] to remain in contact with cancer genetics annually so that we can continuously update the family history and inform him of any changes in cancer genetics and testing that may be of benefit for his family. Mr. Anderle questions were answered to his satisfaction today. Our  contact information was provided should additional questions or concerns arise.  *** Despite our recommendation, Mr. Snelson did not wish to pursue genetic testing at today's visit. We understand this decision, and remain available to coordinate genetic testing at any time in the future. We, therefore, recommend Mr. Frasier continue to follow the cancer screening guidelines given by his primary healthcare provider.  ***Based on Mr. Bromell's family history, we recommended his ***, who was diagnosed with *** at age ***, have genetic counseling and testing. Mr. Gathright will let us know if we can be of any assistance in coordinating genetic counseling and/or testing for this family member.   Lastly, we encouraged Mr. Tumblin to remain in contact with cancer genetics annually so that we can continuously update the family history and inform him of any  changes in cancer genetics and testing that may be of benefit for this family.   Mr.  Mirabal questions were answered to his satisfaction today. Our contact information was provided should additional questions or concerns arise. Thank you for the referral and allowing [place] to share in the care of your patient.   Karen P. Florene Glen, [place], [facility] Certified Genetic Counselor [email or web address] phone: [phone]  The patient was seen for a total of *** minutes in face-to-face genetic counseling.  This patient was discussed with Drs. Magrinat, Lindi Adie and/or Burr Medico who agrees with the above.    _______________________________________________________________________ For Office Staff:  Number of people involved in session: *** Was an Intern/ student involved with case: {YES/NO:63}

## 2017-10-23 ENCOUNTER — Telehealth: Payer: Self-pay | Admitting: Neurology

## 2017-10-23 ENCOUNTER — Encounter: Payer: Self-pay | Admitting: Neurology

## 2017-10-23 NOTE — Telephone Encounter (Signed)
Called the patient's wife back to make sure I wrote the correct information and she addressed that it should be to whom it may concern. I have written the letter. The patients wife is asking for resources on memory loss and what help is out there. I have put together a packet of information and will have the patient's wife pick up along with the letter.  Pt's wife verbalized understanding.

## 2017-10-23 NOTE — Telephone Encounter (Signed)
Pts wife [name] called requesting a call back stating she is needing a document stating that due to the pts "state of mind he shouldn't be allowed to sign anything while alone". Due to the pts dementia

## 2017-10-28 ENCOUNTER — Other Ambulatory Visit: Payer: Self-pay

## 2017-10-28 NOTE — Patient Outreach (Signed)
[facility]) Care Management  10/28/2017  [name] Oct 10, 1940 [id number]   Medication Adherence call to [name] left a message for patient to call back patient is due on Losartan / Hctz 50/12.5. [name] is showing past due under [place].  [facility] Management Direct Dial [phone]  Fax [phone] [email or web address]

## 2017-10-29 DIAGNOSIS — E039 Hypothyroidism, unspecified: Secondary | ICD-10-CM | POA: Diagnosis not present

## 2017-10-29 DIAGNOSIS — I2510 Atherosclerotic heart disease of native coronary artery without angina pectoris: Secondary | ICD-10-CM | POA: Diagnosis not present

## 2017-10-29 DIAGNOSIS — E559 Vitamin D deficiency, unspecified: Secondary | ICD-10-CM | POA: Diagnosis not present

## 2017-10-30 ENCOUNTER — Telehealth: Payer: Self-pay | Admitting: Genetic Counselor

## 2017-10-30 ENCOUNTER — Ambulatory Visit: Payer: Self-pay | Admitting: Genetic Counselor

## 2017-10-30 ENCOUNTER — Encounter: Payer: Self-pay | Admitting: Genetic Counselor

## 2017-10-30 DIAGNOSIS — Z1379 Encounter for other screening for genetic and chromosomal anomalies: Secondary | ICD-10-CM

## 2017-10-30 NOTE — Telephone Encounter (Signed)
Revealed negative genetic testing.  Discussed that we do not know why he has prostate cancer and melanoma or why there is cancer in the family. It could be due to a different gene that we are not testing, or maybe our current technology may not be able to pick something up.  It will be important for him to keep in contact with genetics to keep up with whether additional testing may be needed.  Two VUS were identified in BARD1 and TSC1.  This is still a normal result.  

## 2017-10-30 NOTE — Progress Notes (Addendum)
HPI:  [name] was previously seen in the [facility] clinic due to a personal and family history of cancer and concerns regarding a hereditary predisposition to cancer. Please refer to our prior cancer genetics clinic note for more information regarding [name]'s medical, social and family histories, and our assessment and recommendations, at the time. [name] recent genetic test results were disclosed to him, as were recommendations warranted by these results. These results and recommendations are discussed in more detail below.  CANCER HISTORY:   No history exists.    FAMILY HISTORY:  We obtained a detailed, 4-generation family history.  Significant diagnoses are listed below: Family History  Problem Relation Age of Onset   Heart disease Father    Heart disease Mother    Cirrhosis Brother    Ulcers Brother    Ovarian cancer Other        d. 71    The patient has two daughters who are cancer free.  He has three full siblings and three paternal half siblings who are cancer free.  One full brother died of cirrhosis of the liver.  His daughter was diagnosed with ovarian cancer and died at 55.  Both of his parents are deceased.   The patient's mother died in her late 11'X from complications of diabetes and heart disease.  She has multiple siblings who did not have cancer.  Her parents died of old age.   The patient's father died of old age.  He had five sisters who did not have cancer.  His parents died from possible old age.   [name] is unaware of previous family history of genetic testing for hereditary cancer risks. Patient's maternal ancestors are of [place] descent, and paternal ancestors are of [place] descent. There is no reported Ashkenazi Jewish ancestry. There is no known consanguinity.  GENETIC TEST RESULTS: Genetic testing reported out on October 29, 2017 through the Multi-cancer panel found no deleterious mutations.  The Multi-Gene  Panel offered by [facility] includes sequencing and/or deletion duplication testing of the following 83 genes: AIP, ALK, APC, ATM, AXIN2,BAP1,  BARD1, BLM, BMPR1A, BRCA1, BRCA2, BRIP1, CASR, CDC73, CDH1, CDK4, CDKN1B, CDKN1C, CDKN2A (p14ARF), CDKN2A (p16INK4a), CEBPA, CHEK2, CTNNA1, DICER1, DIS3L2, EGFR (c.2369C>T, p.Thr790Met variant only), EPCAM (Deletion/duplication testing only), FH, FLCN, GATA2, GPC3, GREM1 (Promoter region deletion/duplication testing only), HOXB13 (c.251G>A, p.Gly84Glu), HRAS, KIT, MAX, MEN1, MET, MITF (c.952G>A, p.Glu318Lys variant only), MLH1, MSH2, MSH3, MSH6, MUTYH, NBN, NF1, NF2, NTHL1, PALB2, PDGFRA, PHOX2B, PMS2, POLD1, POLE, POT1, PRKAR1A, PTCH1, PTEN, RAD50, RAD51C, RAD51D, RB1, RECQL4, RET, RUNX1, SDHAF2, SDHA (sequence changes only), SDHB, SDHC, SDHD, SMAD4, SMARCA4, SMARCB1, SMARCE1, STK11, SUFU, TERT, TERT, TMEM127, TP53, TSC1, TSC2, VHL, WRN and WT1.   The test report has been scanned into EPIC and is located under the Molecular Pathology section of the Results Review tab.    We discussed with [name] that since the current genetic testing is not perfect, it is possible there may be a gene mutation in one of these genes that current testing cannot detect, but that chance is small.  We also discussed, that it is possible that another gene that has not yet been discovered, or that we have not yet tested, is responsible for the cancer diagnoses in the family, and it is, therefore, important to remain in touch with cancer genetics in the future so that we can continue to offer [name] the most up to date genetic testing.   Genetic testing did  detect two Variants of Unknown Significance - one in the BARD1 gene called c.748T>C and another in the TSC1 gene called c.997C>T. At this time, it is unknown if these variants are associated with increased cancer risk or if they are a normal finding, but most variants such as these get reclassified to being inconsequential. They  should not be used to make medical management decisions. With time, we suspect the lab will determine the significance of these variants, if any. If we do learn more about them, we will try to contact [name] to discuss it further. However, it is important to stay in touch with [place] periodically and keep the address and phone number up to date.   UPDATE:  TSC1 c.997C>T VUS has been reclassified to Likely Benign.  The amended report date is January 29, 2021.  CANCER SCREENING RECOMMENDATIONS: This result is reassuring and indicates that [name] likely does not have an increased risk for a future cancer due to a mutation in one of these genes. This normal test also suggests that [name]'s cancer was most likely not due to an inherited predisposition associated with one of these genes.  Most cancers happen by chance and this negative test suggests that his cancer falls into this category.  We, therefore, recommended he continue to follow the cancer management and screening guidelines provided by his oncology and primary healthcare provider.   An individuals cancer risk and medical management are not determined by genetic test results alone. Overall cancer risk assessment incorporates additional factors, including personal medical history, family history, and any available genetic information that may result in a personalized plan for cancer prevention and surveillance.  RECOMMENDATIONS FOR FAMILY MEMBERS:  Women in this family might be at some increased risk of developing cancer, over the general population risk, simply due to the family history of cancer.  We recommended women in this family have a yearly mammogram beginning at age 41, or 20 years younger than the earliest onset of cancer, an annual clinical breast exam, and perform monthly breast self-exams. Women in this family should also have a gynecological exam as recommended by their primary provider. All family members should have a  colonoscopy by age 62.  FOLLOW-UP: Lastly, we discussed with [name] that cancer genetics is a rapidly advancing field and it is possible that new genetic tests will be appropriate for him and/or his family members in the future. We encouraged him to remain in contact with cancer genetics on an annual basis so we can update his personal and family histories and let him know of advances in cancer genetics that may benefit this family.   Our contact number was provided. Mr. Wolgamott questions were answered to his satisfaction, and he knows he is welcome to call us at anytime with additional questions or concerns.   Roma Kayser, MS, [facility] Certified Genetic Counselor [email or web address]

## 2017-11-05 DIAGNOSIS — I2510 Atherosclerotic heart disease of native coronary artery without angina pectoris: Secondary | ICD-10-CM | POA: Diagnosis not present

## 2017-11-05 DIAGNOSIS — R413 Other amnesia: Secondary | ICD-10-CM | POA: Diagnosis not present

## 2017-11-05 DIAGNOSIS — E7800 Pure hypercholesterolemia, unspecified: Secondary | ICD-10-CM | POA: Diagnosis not present

## 2017-11-05 DIAGNOSIS — I10 Essential (primary) hypertension: Secondary | ICD-10-CM | POA: Diagnosis not present

## 2017-12-03 DIAGNOSIS — I2510 Atherosclerotic heart disease of native coronary artery without angina pectoris: Secondary | ICD-10-CM | POA: Diagnosis not present

## 2017-12-03 DIAGNOSIS — R413 Other amnesia: Secondary | ICD-10-CM | POA: Diagnosis not present

## 2017-12-03 DIAGNOSIS — I10 Essential (primary) hypertension: Secondary | ICD-10-CM | POA: Diagnosis not present

## 2017-12-04 DIAGNOSIS — I2510 Atherosclerotic heart disease of native coronary artery without angina pectoris: Secondary | ICD-10-CM | POA: Diagnosis not present

## 2017-12-13 ENCOUNTER — Other Ambulatory Visit: Payer: Self-pay | Admitting: Internal Medicine

## 2017-12-30 ENCOUNTER — Other Ambulatory Visit: Payer: Self-pay | Admitting: Internal Medicine

## 2017-12-30 DIAGNOSIS — R634 Abnormal weight loss: Secondary | ICD-10-CM

## 2018-01-05 ENCOUNTER — Ambulatory Visit
Admission: RE | Admit: 2018-01-05 | Discharge: 2018-01-05 | Disposition: A | Discharge status: Home / Self Care | Payer: Medicare Other | Source: Ambulatory Visit | Attending: Internal Medicine | Admitting: Internal Medicine

## 2018-01-05 DIAGNOSIS — R634 Abnormal weight loss: Secondary | ICD-10-CM

## 2018-01-05 DIAGNOSIS — K4090 Unilateral inguinal hernia, without obstruction or gangrene, not specified as recurrent: Secondary | ICD-10-CM | POA: Diagnosis not present

## 2018-01-05 MED ORDER — IOPAMIDOL (ISOVUE-300) INJECTION 61%
100.0000 mL | Freq: Once | INTRAVENOUS | Status: AC | PRN
  Administered 2018-01-05: 100 mL via INTRAVENOUS

## 2018-01-07 DIAGNOSIS — I890 Lymphedema, not elsewhere classified: Secondary | ICD-10-CM | POA: Diagnosis not present

## 2018-01-07 DIAGNOSIS — Z23 Encounter for immunization: Secondary | ICD-10-CM | POA: Diagnosis not present

## 2018-01-07 DIAGNOSIS — H9190 Unspecified hearing loss, unspecified ear: Secondary | ICD-10-CM | POA: Diagnosis not present

## 2018-01-07 DIAGNOSIS — I10 Essential (primary) hypertension: Secondary | ICD-10-CM | POA: Diagnosis not present

## 2018-01-07 DIAGNOSIS — I2510 Atherosclerotic heart disease of native coronary artery without angina pectoris: Secondary | ICD-10-CM | POA: Diagnosis not present

## 2018-01-14 ENCOUNTER — Other Ambulatory Visit: Payer: Self-pay

## 2018-01-14 MED ORDER — RIVASTIGMINE TARTRATE 1.5 MG PO CAPS: 1.5000 mg | ORAL_CAPSULE | Freq: Two times a day (BID) | ORAL | 0 refills | Status: DC

## 2018-01-14 NOTE — Telephone Encounter (Signed)
Rx sent to mail order pharmacy  

## 2018-01-21 DIAGNOSIS — G988 Other disorders of nervous system: Secondary | ICD-10-CM | POA: Diagnosis not present

## 2018-01-22 ENCOUNTER — Ambulatory Visit: Payer: Medicare Other | Admitting: Adult Health

## 2018-01-22 ENCOUNTER — Encounter

## 2018-01-28 ENCOUNTER — Ambulatory Visit: Payer: Medicare Other | Admitting: Adult Health

## 2018-01-29 DIAGNOSIS — D2371 Other benign neoplasm of skin of right lower limb, including hip: Secondary | ICD-10-CM | POA: Diagnosis not present

## 2018-01-29 DIAGNOSIS — L821 Other seborrheic keratosis: Secondary | ICD-10-CM | POA: Diagnosis not present

## 2018-01-29 DIAGNOSIS — L818 Other specified disorders of pigmentation: Secondary | ICD-10-CM | POA: Diagnosis not present

## 2018-01-29 DIAGNOSIS — Z85820 Personal history of malignant melanoma of skin: Secondary | ICD-10-CM | POA: Diagnosis not present

## 2018-01-29 DIAGNOSIS — L7211 Pilar cyst: Secondary | ICD-10-CM | POA: Diagnosis not present

## 2018-02-04 DIAGNOSIS — Z9889 Other specified postprocedural states: Secondary | ICD-10-CM | POA: Diagnosis not present

## 2018-02-04 DIAGNOSIS — E7800 Pure hypercholesterolemia, unspecified: Secondary | ICD-10-CM | POA: Diagnosis not present

## 2018-02-04 DIAGNOSIS — I351 Nonrheumatic aortic (valve) insufficiency: Secondary | ICD-10-CM | POA: Diagnosis not present

## 2018-02-04 DIAGNOSIS — I2510 Atherosclerotic heart disease of native coronary artery without angina pectoris: Secondary | ICD-10-CM | POA: Diagnosis not present

## 2018-02-10 DIAGNOSIS — G988 Other disorders of nervous system: Secondary | ICD-10-CM | POA: Diagnosis not present

## 2018-02-18 DIAGNOSIS — E038 Other specified hypothyroidism: Secondary | ICD-10-CM | POA: Diagnosis not present

## 2018-02-18 DIAGNOSIS — I2510 Atherosclerotic heart disease of native coronary artery without angina pectoris: Secondary | ICD-10-CM | POA: Diagnosis not present

## 2018-02-18 DIAGNOSIS — E039 Hypothyroidism, unspecified: Secondary | ICD-10-CM | POA: Diagnosis not present

## 2018-02-26 DIAGNOSIS — Z0000 Encounter for general adult medical examination without abnormal findings: Secondary | ICD-10-CM | POA: Diagnosis not present

## 2018-02-27 DIAGNOSIS — R0989 Other specified symptoms and signs involving the circulatory and respiratory systems: Secondary | ICD-10-CM | POA: Diagnosis not present

## 2018-02-27 DIAGNOSIS — I351 Nonrheumatic aortic (valve) insufficiency: Secondary | ICD-10-CM | POA: Diagnosis not present

## 2018-03-17 ENCOUNTER — Telehealth: Payer: Self-pay | Admitting: Adult Health

## 2018-03-17 NOTE — Telephone Encounter (Signed)
Pts wife [name] on [place]) requesting a call stating she would like to discuss the pts dementia farther and would like to know more about an LP.

## 2018-03-17 NOTE — Telephone Encounter (Signed)
LP is not typically done for Dementia.

## 2018-03-18 ENCOUNTER — Encounter: Payer: Self-pay | Admitting: *Deleted

## 2018-03-18 DIAGNOSIS — I10 Essential (primary) hypertension: Secondary | ICD-10-CM | POA: Diagnosis not present

## 2018-03-18 DIAGNOSIS — E7800 Pure hypercholesterolemia, unspecified: Secondary | ICD-10-CM | POA: Diagnosis not present

## 2018-03-18 NOTE — Telephone Encounter (Signed)
Spoke to wife of pt.  She had questions relating to pt diagnosis.  I stated per Dr. Brett Fairy ofv note in 03/2017 was diagnosed with dementia.  She asked about identifier for pt (card in wallet) stating he has dementia. I relayed that they can make one for wallet, or wrist band (with pts name, address, phone) if wanders, gets lost.  She verbalized understanding.  Would like letter stating diagnosis, and I will place in epic and she will pick up when in for her own appt with Dr. Brett Fairy for sleep.  Asked about LP or other testing to diagnose dementia.  I stated per MM/NP LP is not typically done for dementia.  Other testing may be discussed when in for appt.  I offered to move appt up and she stated that she could not do this at this time. Pt has appointment in 09/2018.  She also stated that the exelon patch was having positive benefits for pt as FYI.

## 2018-03-18 NOTE — Telephone Encounter (Signed)
Noted  

## 2018-03-23 DIAGNOSIS — Z9889 Other specified postprocedural states: Secondary | ICD-10-CM | POA: Diagnosis not present

## 2018-03-23 DIAGNOSIS — E7800 Pure hypercholesterolemia, unspecified: Secondary | ICD-10-CM | POA: Diagnosis not present

## 2018-03-23 DIAGNOSIS — I350 Nonrheumatic aortic (valve) stenosis: Secondary | ICD-10-CM | POA: Diagnosis not present

## 2018-03-23 DIAGNOSIS — I2510 Atherosclerotic heart disease of native coronary artery without angina pectoris: Secondary | ICD-10-CM | POA: Diagnosis not present

## 2018-03-28 ENCOUNTER — Other Ambulatory Visit: Payer: Self-pay

## 2018-03-28 ENCOUNTER — Observation Stay (HOSPITAL_COMMUNITY): Payer: Medicare Other

## 2018-03-28 ENCOUNTER — Emergency Department (HOSPITAL_COMMUNITY): Payer: Medicare Other

## 2018-03-28 ENCOUNTER — Encounter (HOSPITAL_COMMUNITY): Payer: Self-pay

## 2018-03-28 ENCOUNTER — Observation Stay (HOSPITAL_COMMUNITY)
Admission: EM | Admit: 2018-03-28 | Discharge: 2018-03-29 | Disposition: A | Discharge status: Home / Self Care | Payer: Medicare Other | Attending: Internal Medicine | Admitting: Internal Medicine

## 2018-03-28 DIAGNOSIS — F03B18 Unspecified dementia, moderate, with other behavioral disturbance: Secondary | ICD-10-CM | POA: Diagnosis present

## 2018-03-28 DIAGNOSIS — Z79899 Other long term (current) drug therapy: Secondary | ICD-10-CM | POA: Diagnosis not present

## 2018-03-28 DIAGNOSIS — Z955 Presence of coronary angioplasty implant and graft: Secondary | ICD-10-CM | POA: Diagnosis not present

## 2018-03-28 DIAGNOSIS — Z7982 Long term (current) use of aspirin: Secondary | ICD-10-CM | POA: Insufficient documentation

## 2018-03-28 DIAGNOSIS — Z880 Allergy status to penicillin: Secondary | ICD-10-CM | POA: Diagnosis not present

## 2018-03-28 DIAGNOSIS — F0280 Dementia in other diseases classified elsewhere without behavioral disturbance: Secondary | ICD-10-CM | POA: Insufficient documentation

## 2018-03-28 DIAGNOSIS — R410 Disorientation, unspecified: Secondary | ICD-10-CM | POA: Insufficient documentation

## 2018-03-28 DIAGNOSIS — I959 Hypotension, unspecified: Secondary | ICD-10-CM | POA: Diagnosis not present

## 2018-03-28 DIAGNOSIS — I10 Essential (primary) hypertension: Secondary | ICD-10-CM | POA: Diagnosis not present

## 2018-03-28 DIAGNOSIS — F0391 Unspecified dementia with behavioral disturbance: Secondary | ICD-10-CM | POA: Diagnosis present

## 2018-03-28 DIAGNOSIS — Z8546 Personal history of malignant neoplasm of prostate: Secondary | ICD-10-CM | POA: Insufficient documentation

## 2018-03-28 DIAGNOSIS — Z743 Need for continuous supervision: Secondary | ICD-10-CM | POA: Diagnosis not present

## 2018-03-28 DIAGNOSIS — I2583 Coronary atherosclerosis due to lipid rich plaque: Secondary | ICD-10-CM | POA: Diagnosis not present

## 2018-03-28 DIAGNOSIS — I2510 Atherosclerotic heart disease of native coronary artery without angina pectoris: Secondary | ICD-10-CM | POA: Diagnosis present

## 2018-03-28 DIAGNOSIS — R55 Syncope and collapse: Principal | ICD-10-CM | POA: Diagnosis present

## 2018-03-28 DIAGNOSIS — J9811 Atelectasis: Secondary | ICD-10-CM | POA: Diagnosis not present

## 2018-03-28 DIAGNOSIS — R0902 Hypoxemia: Secondary | ICD-10-CM | POA: Diagnosis not present

## 2018-03-28 LAB — CBC WITH DIFFERENTIAL/PLATELET
Abs Immature Granulocytes: 0.04 10*3/uL (ref 0.00–0.07)
Basophils Absolute: 0.1 10*3/uL (ref 0.0–0.1)
Basophils Relative: 1 %
Eosinophils Absolute: 0.1 10*3/uL (ref 0.0–0.5)
Eosinophils Relative: 1 %
HCT: 46.1 % (ref 39.0–52.0)
Hemoglobin: 14.8 g/dL (ref 13.0–17.0)
Immature Granulocytes: 1 %
Lymphocytes Relative: 13 %
Lymphs Abs: 1.1 10*3/uL (ref 0.7–4.0)
MCH: 31.8 pg (ref 26.0–34.0)
MCHC: 32.1 g/dL (ref 30.0–36.0)
MCV: 99.1 fL (ref 80.0–100.0)
Monocytes Absolute: 0.9 10*3/uL (ref 0.1–1.0)
Monocytes Relative: 11 %
Neutro Abs: 6.2 10*3/uL (ref 1.7–7.7)
Neutrophils Relative %: 73 %
Platelets: 163 10*3/uL (ref 150–400)
RBC: 4.65 MIL/uL (ref 4.22–5.81)
RDW: 13.1 % (ref 11.5–15.5)
WBC: 8.4 10*3/uL (ref 4.0–10.5)
nRBC: 0 % (ref 0.0–0.2)

## 2018-03-28 LAB — COMPREHENSIVE METABOLIC PANEL
ALT: 23 U/L (ref 0–44)
AST: 28 U/L (ref 15–41)
Albumin: 3.7 g/dL (ref 3.5–5.0)
Alkaline Phosphatase: 64 U/L (ref 38–126)
Anion gap: 9 (ref 5–15)
BUN: 18 mg/dL (ref 8–23)
CO2: 28 mmol/L (ref 22–32)
Calcium: 9.6 mg/dL (ref 8.9–10.3)
Chloride: 103 mmol/L (ref 98–111)
Creatinine, Ser: 1 mg/dL (ref 0.61–1.24)
GFR calc Af Amer: >60 mL/min (ref >60)
GFR calc non Af Amer: >60 mL/min (ref >60)
Glucose, Bld: 80 mg/dL (ref 70–99)
Potassium: 4.2 mmol/L (ref 3.5–5.1)
Sodium: 140 mmol/L (ref 135–145)
Total Bilirubin: 0.9 mg/dL (ref 0.3–1.2)
Total Protein: 6.5 g/dL (ref 6.5–8.1)

## 2018-03-28 LAB — URINALYSIS, ROUTINE W REFLEX MICROSCOPIC
Bilirubin Urine: NEGATIVE
Glucose, UA: 150 mg/dL — AB
Hgb urine dipstick: NEGATIVE
Ketones, ur: NEGATIVE mg/dL
Leukocytes, UA: NEGATIVE
Nitrite: NEGATIVE
Protein, ur: NEGATIVE mg/dL
Specific Gravity, Urine: 1.024 (ref 1.005–1.030)
pH: 6 (ref 5.0–8.0)

## 2018-03-28 LAB — RAPID URINE DRUG SCREEN, HOSP PERFORMED
Amphetamines: NOT DETECTED
Barbiturates: NOT DETECTED
Benzodiazepines: NOT DETECTED
Cocaine: NOT DETECTED
Opiates: NOT DETECTED
Tetrahydrocannabinol: NOT DETECTED

## 2018-03-28 LAB — CBG MONITORING, ED: Glucose-Capillary: 102 mg/dL — ABNORMAL HIGH (ref 70–99)

## 2018-03-28 LAB — I-STAT TROPONIN, ED: Troponin i, poc: 0.01 ng/mL (ref 0.00–0.08)

## 2018-03-28 LAB — GLUCOSE, CAPILLARY: Glucose-Capillary: 109 mg/dL — ABNORMAL HIGH (ref 70–99)

## 2018-03-28 LAB — ETHANOL: Alcohol, Ethyl (B): <10 mg/dL (ref <10)

## 2018-03-28 MED ORDER — ASPIRIN EC 81 MG PO TBEC
81.0000 mg | DELAYED_RELEASE_TABLET | Freq: Every day | ORAL | Status: DC
  Filled 2018-03-28: qty 1

## 2018-03-28 MED ORDER — CALCIUM CARBONATE-VITAMIN D 500-200 MG-UNIT PO TABS: 2.0000 | ORAL_TABLET | Freq: Every day | ORAL | Status: DC

## 2018-03-28 MED ORDER — MAGNESIUM 400 MG PO CAPS: 1.0000 | ORAL_CAPSULE | Freq: Every day | ORAL | Status: DC

## 2018-03-28 MED ORDER — DEXTROSE 50 % IV SOLN
25.0000 mL | Freq: Once | INTRAVENOUS | Status: AC
  Administered 2018-03-28: 25 mL via INTRAVENOUS

## 2018-03-28 MED ORDER — MAGNESIUM OXIDE 400 (241.3 MG) MG PO TABS: 400.0000 mg | ORAL_TABLET | Freq: Every day | ORAL | Status: DC

## 2018-03-28 MED ORDER — MEMANTINE HCL 10 MG PO TABS
10.0000 mg | ORAL_TABLET | Freq: Two times a day (BID) | ORAL | Status: DC
  Filled 2018-03-28: qty 1

## 2018-03-28 MED ORDER — DEXTROSE 50 % IV SOLN
INTRAVENOUS | Status: AC
  Filled 2018-03-28: qty 50

## 2018-03-28 NOTE — ED Provider Notes (Signed)
[facility] EMERGENCY DEPARTMENT Provider Note   CSN: [id number] Arrival date & time: 03/28/18  1053     History   Chief Complaint Chief Complaint  Patient presents with  . Altered Mental Status    HPI [name] is a 77 y.o. male.  77 year old male with prior medical history as detailed below presents following a reported syncopal event.  Patient was at home with family.  Family reports that as the patient was having a meal he had a period of unresponsiveness.  Patient slumped forward.  He was unresponsive completely for approximately 2 to 3 minutes.  Following this he was confused more so than his normal baseline for approximately 15 minutes.  Upon his arrival to the ED he is now back to his baseline.  He does have a prior history of dementia.  The patient himself cannot recall the events of this morning. There was no witnessed seizure like activity.   History is obtained from his daughter who is at bedside and his wife who is at bedside.  The history is provided by the patient, medical records and a relative.  Altered Mental Status   This is a new problem. The current episode started less than 1 hour ago. The problem has been resolved. Associated symptoms include confusion and unresponsiveness.    Past Medical History:  Diagnosis Date  . Asthma   . CAD (coronary artery disease)   . Cancer [facility]) 2016   prostate  . Complication of anesthesia    CO2 high according to patient   . Family history of ovarian cancer   . GERD (gastroesophageal reflux disease)   . History of melanoma   . History of prostate cancer   . HTN (hypertension)   . Mitral valve prolapse   . Osteoporosis   . TIA (transient ischemic attack)   . UTI (lower urinary tract infection)     Patient Active Problem List   Diagnosis Date Noted  . Genetic testing 10/30/2017  . Family history of ovarian cancer   . History of prostate cancer   . History of melanoma   . Dementia  associated with other underlying disease without behavioral disturbance ([place]) 03/17/2017  . 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 Sleep behavior disorder, REM 06/11/2016  . TIA due to embolism ([place]) 06/11/2016  . History of heart artery stent 06/11/2016  . Asthma, mild intermittent 11/29/2015  . Asthma, moderate persistent, well-controlled 08/08/2014  . Dyspnea 01/27/2014  . Asthma, moderate persistent, poorly-controlled 01/27/2014  . Hypotension, unspecified 07/15/2013  . Hypotension 07/15/2013  . Dyspnea and respiratory abnormality 07/13/2013  . S/P MVR (mitral valve repair) 01/14/2011  . HTN (hypertension)   . GERD (gastroesophageal reflux disease)   . Osteoporosis   . CAD (coronary artery disease)   . Mitral valve prolapse with severe mitral regurgitation   . DIAPHRAGMATIC DISORDER 02/09/2010  . HYPERTENSION 02/05/2010  . OSTEOPOROSIS 02/05/2010  . CARDIAC MURMUR, SYSTOLIC 04/17/7251  . COUGH 02/05/2010    Past Surgical History:  Procedure Laterality Date  . APPENDECTOMY    . CORONARY ANGIOPLASTY WITH STENT PLACEMENT  04/30/2010   PCI and stenting of proximal RCA  . MITRAL VALVE REPAIR  06/13/2010   right mini thoracotomy for complex valvuloplasty with 63mm Memo ring annuloplasty - Dr. Roxy Manns  . NAILBED REPAIR Right 05/02/2015   Procedure: BIOPSY NAIL BED RIGHT THUMB;  Surgeon: Daryll Brod, MD;  Location: [place];  Service: Orthopedics;  Laterality: Right;  ANESTHESIA:  IV REGIONAL FAB  . PROSTATECTOMY  2016        Home Medications    Prior to Admission medications   Medication Sig Start Date End Date Taking? Authorizing Provider  aspirin 81 MG tablet Take 81 mg by mouth daily.      [provider]  calcium-vitamin D (OSCAL WITH D) 500-200 MG-UNIT per tablet Take 2 tablets by mouth daily.      [provider]  LIVALO 2 MG TABS Takes once weekly 12/31/16   [provider]    losartan-hydrochlorothiazide (HYZAAR) 50-12.5 MG tablet Take 1 tablet by mouth daily. 11/27/15   [provider]  Magnesium 400 MG CAPS Take 1 capsule by mouth daily.    [provider]  memantine (NAMENDA) 10 MG tablet Take 10 mg by mouth 2 (two) times daily.    [provider]  nebivolol (BYSTOLIC) 10 MG tablet Take 10 mg by mouth daily.    [provider]  rivastigmine (EXELON) 1.5 MG capsule Take 1 capsule (1.5 mg total) by mouth 2 (two) times daily. 01/14/18   Dohmeier, Asencion Partridge, MD    Family History Family History  Problem Relation Age of Onset  . Heart disease Father   . Heart disease Mother   . Cirrhosis Brother   . Ulcers Brother   . Ovarian cancer Other        d. 58    Social History Social History   Tobacco Use  . Smoking status: Never Smoker  . Smokeless tobacco: Never Used  Substance Use Topics  . Alcohol use: Yes    Comment: SOCIAL  . Drug use: No     Allergies   Penicillins   Review of Systems Review of Systems  Psychiatric/Behavioral: Positive for confusion.  All other systems reviewed and are negative.    Physical Exam Updated Vital Signs BP (!) 143/79   Pulse 71   Temp 97.7 F (36.5 C) (Oral)   Resp 20   SpO2 100%   Physical Exam Vitals signs and nursing note reviewed.  Constitutional:      General: He is not in acute distress.    Appearance: He is well-developed.  HENT:     Head: Normocephalic and atraumatic.  Eyes:     Conjunctiva/sclera: Conjunctivae normal.     Pupils: Pupils are equal, round, and reactive to light.  Neck:     Musculoskeletal: Normal range of motion and neck supple.  Cardiovascular:     Rate and Rhythm: Normal rate and regular rhythm.     Heart sounds: Normal heart sounds.  Pulmonary:     Effort: Pulmonary effort is normal. No respiratory distress.     Breath sounds: Normal breath sounds.  Abdominal:     General: There is no distension.     Palpations: Abdomen is soft.      Tenderness: There is no abdominal tenderness.  Musculoskeletal: Normal range of motion.        General: No deformity.  Skin:    General: Skin is warm and dry.  Neurological:     General: No focal deficit present.     Mental Status: He is alert. Mental status is at baseline. He is disoriented.     Cranial Nerves: No cranial nerve deficit.     Sensory: No sensory deficit.     Motor: No weakness.     Coordination: Coordination normal.     Comments: Alert  Pleasantly confused  VAN negative  ED Treatments / Results  Labs (all labs ordered are listed, but only abnormal results are displayed) Labs Reviewed  CBG MONITORING, ED - Abnormal; Notable for the following components:      Result Value   Glucose-Capillary 102 (*)    All other components within normal limits  CBC WITH DIFFERENTIAL/PLATELET  RAPID URINE DRUG SCREEN, HOSP PERFORMED  URINALYSIS, ROUTINE W REFLEX MICROSCOPIC  COMPREHENSIVE METABOLIC PANEL  ETHANOL  I-STAT TROPONIN, ED    EKG None  Radiology No results found.  Procedures Procedures (including critical care time)  Medications Ordered in ED Medications  dextrose 50 % solution 25 mL (25 mLs Intravenous See Procedure Record 03/28/18 1108)     Initial Impression / Assessment and Plan / ED Course  I have reviewed the triage vital signs and the nursing notes.  Pertinent labs & imaging results that were available during my care of the patient were reviewed by me and considered in my medical decision making (see chart for details).     MDM  Screen complete  Patient is presenting following reported syncopal event.  Patient has returned to his neurologic baseline upon arrival to the ED.    Screening work-up in the ED is without evidence of significant pathology.  EKG is without evidence of acute ischemia.  Troponin is negative.  Head CT does not show acute process.  Baseline labs are otherwise without significant abnormality.  Patient likely  would benefit from overnight observation.  Hospitalist services were case and will evaluate for admission.  Family is aware of plan of care.   Final Clinical Impressions(s) / ED Diagnoses   Final diagnoses:  Syncope, unspecified syncope type    ED Discharge Orders    None       Valarie Merino, MD 03/28/18 1249

## 2018-03-28 NOTE — ED Notes (Signed)
Pt remains in MRI at this time, will assess patient on return to the department.

## 2018-03-28 NOTE — ED Triage Notes (Signed)
Pt arrived via [facility]; pt from home with c/o AMS; pt's baseline is being able to hold conversation and ambulate on his own; family states that pt is too weak to stand on his own and he is not making sense when talking; pt does hx of Dementia; dtr states that she found an ambien in patient's mouth but made him spit it out; pt wasn't able to follow commands to complete stroke screen. 126/68;74;18;98% on RA; CBG 128

## 2018-03-28 NOTE — H&P (Signed)
History and Physical    BOGDAN VIVONA QVZ:[id number] DOB: Sep 10, 1940 DOA: 03/28/2018  I have briefly reviewed the patient's prior medical records in [place]  PCP: [name], MD  Patient coming from: home  Chief Complaint: pre-syncope  HPI: [name] is a 77 y.o. male with medical history significant of dementia, CAD, prostate cancer, HTN, who presents to the hospital brought by family after an episode this morning when patient was less responsive.  He was sitting down in his kitchen awaiting to have breakfast, about to start to eat, when the daughter noticed that he was slumped over, still trying to reach the food with his right arm but unable to do so, she evaluated him and he would not follow commands, will not answer questions.  EMS was called, patient was brought to the ED and currently he is back to baseline.  The whole episode lasted about 30 minutes or so.  Patient has no recollection of what happened but has underlying dementia and he is quite tangential in his answers.  There are no reported chest pain, palpitations, no reported fevers chills or any other symptoms prior to this event.  They deny that he completely lost consciousness or passed out.  They tell me that they were working closely with patient's cardiologist Dr. Einar Gip and his blood pressure medications are currently being titrated down.  Patient was also started on an Exelon patch few days ago, and family feels like the patches worked great and he is better than he was from a cognitive standpoint.  He was evaluated also by neurology at [facility], and plans are in place for repeat MRI as well as an LP  ED Course: In the ED patient's vital signs are stable, his blood work is essentially unremarkable.  Urinalysis is negative.  Chest x-ray is unremarkable, CT scan head is unremarkable as well.  We were asked to admit for presyncopal episode  Review of Systems: As per HPI otherwise 10 point review of systems  negative.   Past Medical History:  Diagnosis Date  . Asthma   . CAD (coronary artery disease)   . Cancer [facility]) 2016   prostate  . Complication of anesthesia    CO2 high according to patient   . Family history of ovarian cancer   . GERD (gastroesophageal reflux disease)   . History of melanoma   . History of prostate cancer   . HTN (hypertension)   . Mitral valve prolapse   . Osteoporosis   . TIA (transient ischemic attack)   . UTI (lower urinary tract infection)     Past Surgical History:  Procedure Laterality Date  . APPENDECTOMY    . CORONARY ANGIOPLASTY WITH STENT PLACEMENT  04/30/2010   PCI and stenting of proximal RCA  . MITRAL VALVE REPAIR  06/13/2010   right mini thoracotomy for complex valvuloplasty with 56mm Memo ring annuloplasty - Dr. Roxy Manns  . NAILBED REPAIR Right 05/02/2015   Procedure: BIOPSY NAIL BED RIGHT THUMB;  Surgeon: Daryll Brod, MD;  Location: [place];  Service: Orthopedics;  Laterality: Right;  ANESTHESIA:  IV REGIONAL FAB  . PROSTATECTOMY  2016     reports that he has never smoked. He has never used smokeless tobacco. He reports current alcohol use. He reports that he does not use drugs.  Allergies  Allergen Reactions  . Penicillins Itching and Rash    Family History  Problem Relation Age of Onset  . Heart disease Father   .  Heart disease Mother   . Cirrhosis Brother   . Ulcers Brother   . Ovarian cancer Other        d. 36    Prior to Admission medications   Medication Sig Start Date End Date Taking? Authorizing Provider  aspirin 81 MG tablet Take 81 mg by mouth daily.      [provider]  calcium-vitamin D (OSCAL WITH D) 500-200 MG-UNIT per tablet Take 2 tablets by mouth daily.      [provider]  LIVALO 2 MG TABS Takes once weekly 12/31/16   [provider]  losartan-hydrochlorothiazide (HYZAAR) 50-12.5 MG tablet Take 1 tablet by mouth daily. 11/27/15   [provider]  Magnesium 400  MG CAPS Take 1 capsule by mouth daily.    [provider]  memantine (NAMENDA) 10 MG tablet Take 10 mg by mouth 2 (two) times daily.    [provider]  nebivolol (BYSTOLIC) 10 MG tablet Take 10 mg by mouth daily.    [provider]  rivastigmine (EXELON) 1.5 MG capsule Take 1 capsule (1.5 mg total) by mouth 2 (two) times daily. 01/14/18   Dohmeier, Asencion Partridge, MD    Physical Exam: Vitals:   03/28/18 1115 03/28/18 1130 03/28/18 1145 03/28/18 1215  BP: 128/72 (!) 143/79 116/81 130/86  Pulse: 71 71 67 73  Resp: (!) 22 20 14  (!) 22  Temp:      TempSrc:      SpO2: 100% 100% 100% 98%      Constitutional: NAD, calm, comfortable, underlying confusion evident Eyes: PERRL, lids and conjunctivae normal ENMT: Mucous membranes are moist. Posterior pharynx clear of any exudate or lesions.Normal dentition.  Neck: normal, supple, no masses, no thyromegaly Respiratory: clear to auscultation bilaterally, no wheezing, no crackles. Normal respiratory effort. No accessory muscle use.  Cardiovascular: Regular rate and rhythm, 3/6 SEM. No extremity edema. 2+ pedal pulses.  Abdomen: no tenderness, no masses palpated. Bowel sounds positive.  Musculoskeletal: no clubbing / cyanosis. Normal muscle tone.  Skin: no rashes, lesions, ulcers. No induration Neurologic: CN 2-12 grossly intact. Strength 5/5 in all 4.   Labs on Admission: I have personally reviewed following labs and imaging studies  CBC: Recent Labs  Lab 03/28/18 1059  WBC 8.4  NEUTROABS 6.2  HGB 14.8  HCT 46.1  MCV 99.1  PLT 161   Basic Metabolic Panel: Recent Labs  Lab 03/28/18 1059  NA 140  K 4.2  CL 103  CO2 28  GLUCOSE 80  BUN 18  CREATININE 1.00  CALCIUM 9.6   GFR: CrCl cannot be calculated (Unknown ideal weight.). Liver Function Tests: Recent Labs  Lab 03/28/18 1059  AST 28  ALT 23  ALKPHOS 64  BILITOT 0.9  PROT 6.5  ALBUMIN 3.7   No results for input(s): LIPASE, AMYLASE in the last 168  hours. No results for input(s): AMMONIA in the last 168 hours. Coagulation Profile: No results for input(s): INR, PROTIME in the last 168 hours. Cardiac Enzymes: No results for input(s): CKTOTAL, CKMB, CKMBINDEX, TROPONINI in the last 168 hours. BNP (last 3 results) No results for input(s): PROBNP in the last 8760 hours. HbA1C: No results for input(s): HGBA1C in the last 72 hours. CBG: Recent Labs  Lab 03/28/18 1130  GLUCAP 102*   Lipid Profile: No results for input(s): CHOL, HDL, LDLCALC, TRIG, CHOLHDL, LDLDIRECT in the last 72 hours. Thyroid Function Tests: No results for input(s): TSH, T4TOTAL, FREET4, T3FREE, THYROIDAB in the last 72 hours. Anemia  Panel: No results for input(s): VITAMINB12, FOLATE, FERRITIN, TIBC, IRON, RETICCTPCT in the last 72 hours. Urine analysis:    Component Value Date/Time   COLORURINE YELLOW 03/28/2018 1137   APPEARANCEUR CLEAR 03/28/2018 1137   LABSPEC 1.024 03/28/2018 1137   PHURINE 6.0 03/28/2018 1137   GLUCOSEU 150 (A) 03/28/2018 1137   HGBUR NEGATIVE 03/28/2018 1137   BILIRUBINUR NEGATIVE 03/28/2018 1137   KETONESUR NEGATIVE 03/28/2018 1137   PROTEINUR NEGATIVE 03/28/2018 1137   UROBILINOGEN 0.2 07/15/2013 2107   NITRITE NEGATIVE 03/28/2018 1137   LEUKOCYTESUR NEGATIVE 03/28/2018 1137     Radiological Exams on Admission: Ct Head Wo Contrast  Result Date: 03/28/2018 CLINICAL DATA:  77 year old male with weakness, altered mental status EXAM: CT HEAD WITHOUT CONTRAST TECHNIQUE: Contiguous axial images were obtained from the base of the skull through the vertex without intravenous contrast. COMPARISON:  Prior brain MRI 07/04/2016; prior head CT 02/20/2010 FINDINGS: Brain: No evidence of acute infarction, hemorrhage, hydrocephalus, extra-axial collection or mass lesion/mass effect. Vascular: No hyperdense vessel or unexpected calcification. Skull: Normal. Negative for fracture or focal lesion. Stable benign sclerotic lesion in the right  parietal calvarium dating back to at least 2011. Sinuses/Orbits: No acute finding. Other: None. IMPRESSION: Negative head CT. Electronically Signed   By: Jacqulynn Cadet M.D.   On: 03/28/2018 12:16   Dg Chest Port 1 View  Result Date: 03/28/2018 CLINICAL DATA:  Altered mental status, dyspnea today, history hypertension, asthma, coronary artery disease EXAM: PORTABLE CHEST 1 VIEW COMPARISON:  Portable exam 1119 hours compared 12/13/2015 FINDINGS: Borderline enlargement of cardiac silhouette post MVR. Mediastinal contours and pulmonary vascularity normal. Atherosclerotic calcification aorta. Chronic elevation of RIGHT diaphragm with colon interposition. Mild RIGHT basilar atelectasis. Remaining lungs clear. No acute infiltrate, pleural effusion or pneumothorax. Bones demineralized. IMPRESSION: Chronic elevation of RIGHT diaphragm with mild RIGHT basilar atelectasis. Electronically Signed   By: Lavonia Dana M.D.   On: 03/28/2018 11:47    EKG: Independently reviewed. Sinus rhythm   Assessment/Plan Active Problems:   HTN (hypertension)   CAD (coronary artery disease)   History of heart artery stent   Dementia associated with other underlying disease without behavioral disturbance (HCC)   Syncope   Presyncope -He does not appear that he fully passed out, however given reported inability to talk will have to rule out TIA/CVA, obtain brain MRI.  Currently he is back to his normal self -Per EMS his CBG was 58, he was about to eat breakfast however he is not a diabetic, will monitor CBGs ACQHS x 1 day but will not order sliding scale -This did appear after starting Exelon patch, will monitor on telemetry to evaluate for bradycardia as it is a possible side effect  Coronary artery disease -No chest pain, no palpitation, no anginal type symptoms and relatively active at baseline  Dementia -Resume home medications, he has ongoing work-up as an outpatient  Hypertension -We will resume blood  pressure medications after the MRI is back and if negative for CVA. Blood pressure in the ED in the 140s   DVT prophylaxis: SCDs  Code Status: Full code  Family Communication: wife, daughter at bedside Disposition Plan: admit to telemetry, home when ready  Consults called: none     Marzetta Board, MD, PhD [facility] Pager [phone]  If 7PM-7AM, please contact night-coverage [email or web address] Password [email or web address]  03/28/2018, 1:22 PM

## 2018-03-28 NOTE — ED Notes (Signed)
Patient transported to MRI 

## 2018-03-28 NOTE — ED Notes (Signed)
EDP at bedside  

## 2018-03-28 NOTE — ED Notes (Signed)
Patient transported to CT 

## 2018-03-29 DIAGNOSIS — R55 Syncope and collapse: Secondary | ICD-10-CM

## 2018-03-29 DIAGNOSIS — F0280 Dementia in other diseases classified elsewhere without behavioral disturbance: Secondary | ICD-10-CM

## 2018-03-29 DIAGNOSIS — I2583 Coronary atherosclerosis due to lipid rich plaque: Secondary | ICD-10-CM

## 2018-03-29 DIAGNOSIS — I2510 Atherosclerotic heart disease of native coronary artery without angina pectoris: Secondary | ICD-10-CM | POA: Diagnosis not present

## 2018-03-29 DIAGNOSIS — Z955 Presence of coronary angioplasty implant and graft: Secondary | ICD-10-CM

## 2018-03-29 LAB — COMPREHENSIVE METABOLIC PANEL
ALT: 21 U/L (ref 0–44)
AST: 24 U/L (ref 15–41)
Albumin: 3.1 g/dL — ABNORMAL LOW (ref 3.5–5.0)
Alkaline Phosphatase: 59 U/L (ref 38–126)
Anion gap: 9 (ref 5–15)
BUN: 21 mg/dL (ref 8–23)
CO2: 27 mmol/L (ref 22–32)
Calcium: 8.9 mg/dL (ref 8.9–10.3)
Chloride: 104 mmol/L (ref 98–111)
Creatinine, Ser: 0.97 mg/dL (ref 0.61–1.24)
GFR calc Af Amer: >60 mL/min (ref >60)
GFR calc non Af Amer: >60 mL/min (ref >60)
Glucose, Bld: 99 mg/dL (ref 70–99)
Potassium: 3.8 mmol/L (ref 3.5–5.1)
Sodium: 140 mmol/L (ref 135–145)
Total Bilirubin: 1 mg/dL (ref 0.3–1.2)
Total Protein: 5.8 g/dL — ABNORMAL LOW (ref 6.5–8.1)

## 2018-03-29 LAB — CBC
HCT: 41.4 % (ref 39.0–52.0)
Hemoglobin: 13.5 g/dL (ref 13.0–17.0)
MCH: 31.8 pg (ref 26.0–34.0)
MCHC: 32.6 g/dL (ref 30.0–36.0)
MCV: 97.6 fL (ref 80.0–100.0)
Platelets: 160 10*3/uL (ref 150–400)
RBC: 4.24 MIL/uL (ref 4.22–5.81)
RDW: 13 % (ref 11.5–15.5)
WBC: 7.3 10*3/uL (ref 4.0–10.5)
nRBC: 0 % (ref 0.0–0.2)

## 2018-03-29 LAB — GLUCOSE, CAPILLARY
Glucose-Capillary: 64 mg/dL — ABNORMAL LOW (ref 70–99)
Glucose-Capillary: 80 mg/dL (ref 70–99)

## 2018-03-29 MED ORDER — BLOOD GLUCOSE MONITOR KIT: PACK | 1 refills | Status: DC

## 2018-03-29 NOTE — Progress Notes (Signed)
Discussed with Dr. Cruzita Lederer on 03/28/18. Will set up outpatient event monitor to evaluate for bradyarrhythmias.   Nigel Mormon, MD [facility] Pager: [phone] Office: [phone] If no answer Cell [phone]

## 2018-03-29 NOTE — Discharge Instructions (Signed)
Check your sugars early in the morning empty stomach, maintain a record in a logbook and show it to your PCP in 5 to 7 days.    Follow with Primary MD [name], MD in 7 days   Get CBC, BMP, TSH, Random AM Cortisol checked  by Primary MD in 5-7 days    Activity: As tolerated with Full fall precautions use walker/cane & assistance as needed  Disposition Home    Diet: Heart Healthy     Special Instructions: If you have smoked or chewed Tobacco  in the last 2 yrs please stop smoking, stop any regular Alcohol  and or any Recreational drug use.  On your next visit with your primary care physician please Get Medicines reviewed and adjusted.  Please request your Prim.MD to go over all Hospital Tests and Procedure/Radiological results at the follow up, please get all Hospital records sent to your Prim MD by signing hospital release before you go home.  If you experience worsening of your admission symptoms, develop shortness of breath, life threatening emergency, suicidal or homicidal thoughts you must seek medical attention immediately by calling 911 or calling your MD immediately  if symptoms less severe.  You Must read complete instructions/literature along with all the possible adverse reactions/side effects for all the Medicines you take and that have been prescribed to you. Take any new Medicines after you have completely understood and accpet all the possible adverse reactions/side effects.

## 2018-03-29 NOTE — Progress Notes (Signed)
Nsg Discharge Note  Admit Date:  03/28/2018 Discharge date: 03/29/2018   [name] to be D/C'd Home per MD order.  AVS teaching completed. Patient/caregiver able to verbalize understanding.   Skin clean, dry and intact without evidence of skin break down, no evidence of skin tears noted. IV catheter discontinued intact. Site without signs and symptoms of complications - no redness or edema noted at insertion site, patient denies c/o pain - only slight tenderness at site.  Dressing with slight pressure applied.  D/c Instructions-Education: Discharge instructions given to patient/family with verbalized understanding. D/c education completed with patient/family including follow up instructions, medication list, d/c activities limitations if indicated, with other d/c instructions as indicated by MD - patient able to verbalize understanding, all questions fully answered. Patient instructed to return to ED, call 911, or call MD for any changes in condition.  Patient escorted via [place], and D/C home via private auto.  Hiram Comber, RN 03/29/2018 9:55 AM

## 2018-03-29 NOTE — Discharge Summary (Signed)
[name] FPO:[id number] DOB: [birth year] DOA: 03/28/2018  PCP: [name], MD  Admit date: 03/28/2018  Discharge date: 03/29/2018  Admitted From: Home   Disposition:  Home   Recommendations for Outpatient Follow-up:   Follow up with PCP in 1-2 weeks  PCP Please obtain BMP/CBC, 2 view CXR in 1week,  (see Discharge instructions)   PCP Please follow up on the following pending results: Monitor CBGs, BMP, Heart Rate   Home Health: None   Equipment/Devices: None  Consultations: None Discharge Condition: Stable   CODE STATUS: Full   Diet Recommendation: Heart Healthy   Diet Order            Diet - low sodium heart healthy        Diet regular Room service appropriate? Yes; Fluid consistency: Thin  Diet effective now               Chief Complaint  Patient presents with  . Altered Mental Status     Brief history of present illness from the day of admission and additional interim summary    [name] is a 77 y.o. male with medical history significant of dementia, CAD, prostate cancer, HTN, who presents to the hospital brought by family after an episode this morning when patient was less responsive.  He was sitting down in his kitchen awaiting to have breakfast, about to start to eat, when the daughter noticed that he was slumped over, still trying to reach the food with his right arm but unable to do so, she evaluated him and he would not follow commands, will not answer questions.  EMS was called, patient was brought to the ED and currently he is back to baseline.  The whole episode lasted about 30 minutes or so.    Patient has no recollection of what happened but has underlying dementia and he is quite tangential in his answers.  There are no reported chest pain, palpitations, no reported  fevers chills or any other symptoms prior to this event.  They deny that he completely lost consciousness or passed out.  They tell me that they were working closely with patient's cardiologist Dr. Einar Gip and his blood pressure medications are currently being titrated down.  Patient was also started on an Exelon patch few days ago, and family feels like the patches worked great and he is better than he was from a cognitive standpoint.  He was evaluated also by neurology at [facility], and plans are in place for repeat MRI as well as an LP.   ED Course: In the ED patient's vital signs are stable, his blood work is essentially unremarkable.  Urinalysis is negative.  Chest x-ray is unremarkable, CT scan head is unremarkable as well.  We were asked to admit for presyncopal episode  Hospital Course    Presyncope - with mild hypoglycemia at the time of admission CBG was 58.    He does not appear that he fully passed out, however given reported inability to talk we obtained CT head and MRI MRI brain which were unremarkable.  He had no focal deficits here or any aphasia or dysarthria.  CBG was 58 at the time of admission and also he was recently started on Exelon patch.  It is quite possible that his presyncopal episode could have been due to mild hypoglycemia for which I have given him a glucometer with testing strips to do his CBGs every morning for a week and showed the results to PCP.  Also Exelon patch could have caused mild bradycardia which it is known to do causing presyncopal episode.    I will stop Exelon patch as well.  In 24 hours of monitoring here on telemetry he was stable, his CBGs were relatively unremarkable.  He is completely symptom-free and eager to go home.  Will be discharged home.  Will hold Exelon patch and provide him testing supplies.  To follow with PCP within a week.   Coronary artery disease -No chest pain, no palpitation,  no anginal type symptoms and relatively active at baseline. Follow with primary cardiologist as needed.  Dementia -Exelon patch and follow with [place] neurology outpatient as he is doing.  Apparently there is a pending LP.  MRI was obtained here which was nonacute.  Hypertension -New home regimen.   Discharge diagnosis     Active Problems:   HTN (hypertension)   CAD (coronary artery disease)   History of heart artery stent   Dementia associated with other underlying disease without behavioral disturbance ([place])   Syncope    Discharge instructions    Discharge Instructions    Diet - low sodium heart healthy   Complete by:  As directed    Discharge instructions   Complete by:  As directed    Check your sugars early in the morning empty stomach, maintain a record in a logbook and show it to your PCP in 5 to 7 days.    Follow with Primary MD [name], MD in 7 days   Get CBC, BMP, TSH checked  by Primary MD in 5-7 days    Activity: As tolerated with Full fall precautions use walker/cane & assistance as needed  Disposition Home    Diet: Heart Healthy     Special Instructions: If you have smoked or chewed Tobacco  in the last 2 yrs please stop smoking, stop any regular Alcohol  and or any Recreational drug use.  On your next visit with your primary care physician please Get Medicines reviewed and adjusted.  Please request your Prim.MD to go over all Hospital Tests and Procedure/Radiological results at the follow up, please get all Hospital records sent to your Prim MD by signing hospital release before you go home.  If you experience worsening of your admission symptoms, develop shortness of breath, life threatening emergency, suicidal or homicidal thoughts you must seek medical attention immediately by calling 911 or calling your MD immediately  if symptoms less severe.  You Must read complete instructions/literature along with all the possible adverse reactions/side effects  for all the Medicines you take and that have been prescribed to you. Take any new Medicines after you have completely understood and accpet all the possible adverse reactions/side effects.   Increase activity slowly   Complete by:  As directed  Discharge Medications   Allergies as of 03/29/2018      Reactions   Penicillins Itching, Rash   Has patient had a PCN reaction causing immediate rash, facial/tongue/throat swelling, SOB or lightheadedness with hypotension: YES Has patient had a PCN reaction causing severe rash involving mucus membranes or skin necrosis: NO Has patient had a PCN reaction that required hospitalization: NO Has patient had a PCN reaction occurring within the last 10 years: NO If all of the above answers are "NO", then may proceed with Cephalosporin use.      Medication List    STOP taking these medications   rivastigmine 1.5 MG capsule Commonly known as:  EXELON   rivastigmine 4.6 mg/24hr Commonly known as:  EXELON     TAKE these medications   aspirin 81 MG tablet Take 81 mg by mouth daily.   atorvastatin 10 MG tablet Commonly known as:  LIPITOR Take 10 mg by mouth daily.   blood glucose meter kit and supplies Kit Dispense based on patient and insurance preference. Use up to four times daily as directed. (FOR ICD-9 250.00, 250.01). For am fasting CBG check.   calcium-vitamin D 500-200 MG-UNIT tablet Commonly known as:  OSCAL WITH D Take 1 tablet by mouth daily.   CEREFOLIN PO Take 1 tablet by mouth daily.   escitalopram 5 MG tablet Commonly known as:  LEXAPRO Take 5 mg by mouth every morning.   hydrochlorothiazide 12.5 MG capsule Commonly known as:  MICROZIDE Take 6.25 mg by mouth daily.   Linseed Oil Oil Take 1,000 mg by mouth 2 (two) times daily.   losartan 50 MG tablet Commonly known as:  COZAAR Take 25 mg by mouth daily.   magnesium oxide 400 MG tablet Commonly known as:  MAG-OX Take 400 mg by mouth daily.   OVER THE  COUNTER MEDICATION Take 1 tablet by mouth daily. ashwagandha   OVER THE COUNTER MEDICATION Take 1 tablet by mouth daily. Brahmi   VITAMIN B COMPLEX PO Take 1 tablet by mouth daily.   vitamin C 500 MG tablet Commonly known as:  ASCORBIC ACID Take 500 mg by mouth daily.       Follow-up Information    [name], MD. Schedule an appointment as soon as possible for a visit in 1 week(s).   Specialty:  Internal Medicine Contact information: [address] [place]  [phone]           Major procedures and Radiology Reports - PLEASE review detailed and final reports thoroughly  -        Ct Head Wo Contrast  Result Date: 03/28/2018 CLINICAL DATA:  77 year old male with weakness, altered mental status EXAM: CT HEAD WITHOUT CONTRAST TECHNIQUE: Contiguous axial images were obtained from the base of the skull through the vertex without intravenous contrast. COMPARISON:  Prior brain MRI 07/04/2016; prior head CT 02/20/2010 FINDINGS: Brain: No evidence of acute infarction, hemorrhage, hydrocephalus, extra-axial collection or mass lesion/mass effect. Vascular: No hyperdense vessel or unexpected calcification. Skull: Normal. Negative for fracture or focal lesion. Stable benign sclerotic lesion in the right parietal calvarium dating back to at least 2011. Sinuses/Orbits: No acute finding. Other: None. IMPRESSION: Negative head CT. Electronically Signed   By: Jacqulynn Cadet M.D.   On: 03/28/2018 12:16   Mr Brain Wo Contrast  Result Date: 03/28/2018 CLINICAL DATA:  77 year old male with syncope, confusion. EXAM: MRI HEAD WITHOUT CONTRAST TECHNIQUE: Multiplanar, multiecho pulse sequences of the brain and surrounding structures were obtained without  intravenous contrast. COMPARISON:  Head CT 1204 hours today. Brain MRI 07/04/2016 and earlier. FINDINGS: Study is mildly degraded by motion artifact despite repeated imaging attempts. Brain: No restricted diffusion to  suggest acute infarction. No midline shift, mass effect, evidence of mass lesion, ventriculomegaly, extra-axial collection or acute intracranial hemorrhage. Cervicomedullary junction and pituitary are within normal limits. Mild for age scattered cerebral white matter T2 and FLAIR hyperintensity appears stable since 2018. No cortical encephalomalacia or chronic cerebral blood products identified. Deep gray matter nuclei, brainstem and cerebellum appear within normal limits. Vascular: Major intracranial vascular flow voids are stable since 2018. Skull and upper cervical spine: Negative visible cervical spine. Normal bone marrow signal. Sinuses/Orbits: Stable and negative. Other: Mastoids remain clear. Stable and grossly negative internal auditory structures. Scalp and face soft tissues appear negative. IMPRESSION: No acute intracranial abnormality. Stable since 2018 and largely normal for age noncontrast MRI appearance of the brain. Electronically Signed   By: Genevie Ann M.D.   On: 03/28/2018 16:13   Dg Chest Port 1 View  Result Date: 03/28/2018 CLINICAL DATA:  Altered mental status, dyspnea today, history hypertension, asthma, coronary artery disease EXAM: PORTABLE CHEST 1 VIEW COMPARISON:  Portable exam 1119 hours compared 12/13/2015 FINDINGS: Borderline enlargement of cardiac silhouette post MVR. Mediastinal contours and pulmonary vascularity normal. Atherosclerotic calcification aorta. Chronic elevation of RIGHT diaphragm with colon interposition. Mild RIGHT basilar atelectasis. Remaining lungs clear. No acute infiltrate, pleural effusion or pneumothorax. Bones demineralized. IMPRESSION: Chronic elevation of RIGHT diaphragm with mild RIGHT basilar atelectasis. Electronically Signed   By: Lavonia Dana M.D.   On: 03/28/2018 11:47    Micro Results    No results found for this or any previous visit (from the past 240 hour(s)).  Today   Subjective    Brownie Nehme today has no headache,no chest  abdominal pain,no new weakness tingling or numbness, feels much better wants to go home today.     Objective   Blood pressure (!) 128/92, pulse 79, temperature 97.7 F (36.5 C), resp. rate 18, SpO2 98 %.  No intake or output data in the 24 hours ending 03/29/18 0905  Exam Awake Alert, No new F.N deficits, Normal affect [place].AT,PERRAL Supple Neck,No JVD, No cervical lymphadenopathy appriciated.  Symmetrical Chest wall movement, Good air movement bilaterally, CTAB RRR,No Gallops,Rubs or new Murmurs, No Parasternal Heave +ve B.Sounds, Abd Soft, Non tender, No organomegaly appriciated, No rebound -guarding or rigidity. No Cyanosis, Clubbing or edema, No new Rash or bruise   Data Review   CBC w Diff:  Lab Results  Component Value Date   WBC 7.3 03/29/2018   HGB 13.5 03/29/2018   HGB 14.3 01/02/2017   HCT 41.4 03/29/2018   HCT 42.5 01/02/2017   PLT 160 03/29/2018   PLT 166 01/02/2017   LYMPHOPCT 13 03/28/2018   LYMPHOPCT 20.4 01/02/2017   MONOPCT 11 03/28/2018   MONOPCT 12.0 01/02/2017   EOSPCT 1 03/28/2018   EOSPCT 3.0 01/02/2017   BASOPCT 1 03/28/2018   BASOPCT 0.8 01/02/2017    CMP:  Lab Results  Component Value Date   NA 140 03/29/2018   NA 142 01/02/2017   K 3.8 03/29/2018   K 4.3 01/02/2017   CL 104 03/29/2018   CO2 27 03/29/2018   CO2 30 (H) 01/02/2017   BUN 21 03/29/2018   BUN 22.5 01/02/2017   CREATININE 0.97 03/29/2018   CREATININE 1.1 01/02/2017   PROT 5.8 (L) 03/29/2018   PROT 6.9 01/02/2017   ALBUMIN 3.1 (L)  03/29/2018   ALBUMIN 3.6 01/02/2017   BILITOT 1.0 03/29/2018   BILITOT 0.70 01/02/2017   ALKPHOS 59 03/29/2018   ALKPHOS 64 01/02/2017   AST 24 03/29/2018   AST 32 01/02/2017   ALT 21 03/29/2018   ALT 20 01/02/2017  . CBG (last 3)  Recent Labs    03/28/18 1130 03/28/18 2205 03/29/18 0821  GLUCAP 102* 109* 80     Total Time in preparing paper work, data evaluation and todays exam - 90 minutes  Lala Lund M.D on 03/29/2018 at  [place]  [phone]

## 2018-04-01 DIAGNOSIS — R001 Bradycardia, unspecified: Secondary | ICD-10-CM | POA: Diagnosis not present

## 2018-04-03 DIAGNOSIS — R55 Syncope and collapse: Secondary | ICD-10-CM | POA: Diagnosis not present

## 2018-04-03 DIAGNOSIS — H6120 Impacted cerumen, unspecified ear: Secondary | ICD-10-CM | POA: Diagnosis not present

## 2018-05-06 ENCOUNTER — Ambulatory Visit: Payer: Medicare Other | Admitting: Neurology

## 2018-05-19 DIAGNOSIS — S8991XA Unspecified injury of right lower leg, initial encounter: Secondary | ICD-10-CM | POA: Diagnosis not present

## 2018-05-19 DIAGNOSIS — M25561 Pain in right knee: Secondary | ICD-10-CM | POA: Diagnosis not present

## 2018-06-02 DIAGNOSIS — J988 Other specified respiratory disorders: Secondary | ICD-10-CM | POA: Diagnosis not present

## 2018-06-02 DIAGNOSIS — J029 Acute pharyngitis, unspecified: Secondary | ICD-10-CM | POA: Diagnosis not present

## 2018-06-03 ENCOUNTER — Encounter: Payer: Self-pay | Admitting: Neurology

## 2018-06-04 ENCOUNTER — Telehealth: Payer: Self-pay | Admitting: Neurology

## 2018-06-04 NOTE — Telephone Encounter (Signed)
Pt wife asked if I would bring to Dr Dohmeier's attention that when they saw a MD at [facility] they started the patient on Rivastigmine 0.6mg  due to him being intolerant to the PO medications. He failed namenda and excelon capsules.  The patient's wife wanted to know  Dr. Edwena Felty thoughts on this medication and if she is ok with prescribing moving forward since [place] is farther then they would like to travel. She states since starting the medication he is more lucid and is tolerating it well. Advised the wife I would make Dr Dohmeier aware and contact her with her thoughts and opinion for the patient. She was appreciative.

## 2018-06-04 NOTE — Telephone Encounter (Signed)
That is a good idea! We can continue to prescribe from [facility].

## 2018-07-28 ENCOUNTER — Encounter: Payer: Self-pay | Admitting: Neurology

## 2018-08-07 DIAGNOSIS — G301 Alzheimer's disease with late onset: Secondary | ICD-10-CM | POA: Diagnosis not present

## 2018-08-10 ENCOUNTER — Telehealth: Payer: Self-pay | Admitting: Neurology

## 2018-08-10 NOTE — Telephone Encounter (Signed)
Called the patient to review his chart with him. No answer. LVM informing the patient to call back.

## 2018-08-10 NOTE — Telephone Encounter (Signed)
Due to current COVID 19 pandemic, our office is severely reducing in office visits until further notice, in order to minimize the risk to our patients and healthcare providers.   Called patient to offer a sooner appointment via virtual visit. Patient has not been seen since October and was scheduled to come back in June with [facility] NP. I spoke with patient's wife, [name], who accepted the virtual visit and verbalized understanding of the Webex process. [name] is aware that she will receive and e-mail as well as 2 phone calls, one from RN and the other from front office staff around 30 minutes prior to appointment to complete check-in process. It is best to use the contact number in the chart for [name] to reach her regarding this appointment.  Pt understands that although there may be some limitations with this type of visit, we will take all precautions to reduce any security or privacy concerns.  Pt understands that this will be treated like an in office visit and we will file with pt's insurance, and there may be a patient responsible charge related to this service.  Pt's email is [email or web address]. Pt understands that the [employer] webex software must be downloaded and operational on the device pt plans to use for the visit.

## 2018-08-13 ENCOUNTER — Ambulatory Visit: Payer: Medicare Other | Admitting: Neurology

## 2018-08-20 ENCOUNTER — Ambulatory Visit: Payer: Medicare Other | Admitting: Neurology

## 2018-08-20 DIAGNOSIS — E7800 Pure hypercholesterolemia, unspecified: Secondary | ICD-10-CM | POA: Diagnosis not present

## 2018-08-26 ENCOUNTER — Other Ambulatory Visit: Payer: Self-pay | Admitting: Cardiology

## 2018-08-26 MED ORDER — ATORVASTATIN CALCIUM 10 MG PO TABS: 10.0000 mg | ORAL_TABLET | Freq: Every day | ORAL | 3 refills | Status: DC

## 2018-08-27 DIAGNOSIS — R413 Other amnesia: Secondary | ICD-10-CM | POA: Diagnosis not present

## 2018-08-27 DIAGNOSIS — E7800 Pure hypercholesterolemia, unspecified: Secondary | ICD-10-CM | POA: Diagnosis not present

## 2018-08-27 DIAGNOSIS — G301 Alzheimer's disease with late onset: Secondary | ICD-10-CM | POA: Diagnosis not present

## 2018-08-27 DIAGNOSIS — I10 Essential (primary) hypertension: Secondary | ICD-10-CM | POA: Diagnosis not present

## 2018-08-31 ENCOUNTER — Encounter: Payer: Self-pay | Admitting: Neurology

## 2018-09-02 ENCOUNTER — Ambulatory Visit: Payer: Medicare Other | Admitting: Neurology

## 2018-09-09 ENCOUNTER — Encounter: Payer: Self-pay | Admitting: Neurology

## 2018-10-08 ENCOUNTER — Ambulatory Visit: Payer: Medicare Other | Admitting: Adult Health

## 2018-10-08 DIAGNOSIS — G301 Alzheimer's disease with late onset: Secondary | ICD-10-CM | POA: Diagnosis not present

## 2018-10-15 ENCOUNTER — Ambulatory Visit: Payer: Medicare Other | Admitting: Neurology

## 2018-10-27 ENCOUNTER — Other Ambulatory Visit: Payer: Self-pay

## 2018-10-27 ENCOUNTER — Ambulatory Visit: Payer: Medicare Other | Admitting: Podiatry

## 2018-10-27 ENCOUNTER — Encounter: Payer: Self-pay | Admitting: Podiatry

## 2018-10-27 DIAGNOSIS — M2040 Other hammer toe(s) (acquired), unspecified foot: Secondary | ICD-10-CM | POA: Diagnosis not present

## 2018-10-27 NOTE — Progress Notes (Signed)
This patient presents to the office for examination of his feet.   He presents to the office developing callus on the bottom of both fifth toes especially her fifth toe right.  He presents with his wife for examination.  His nails are causing no pain or discomfort.  He presents for evaluation and treatment.  Vascular  Dorsalis pedis and posterior tibial pulses are palpable  B/L.  Capillary return  WNL.  Temperature gradient is  WNL.  Skin turgor  WNL  Sensorium  Senn Weinstein monofilament wire  WNL. Normal tactile sensation.  Nail Exam  Patient has normal nails with no evidence of bacterial or fungal infection.  Orthopedic  Exam  Muscle tone and muscle strength  WNL.  No limitations of motion feet  B/L.  No crepitus or joint effusion noted.  Foot type is unremarkable and digits show no abnormalities.  HAV  B/L.  Adducto-varus  Hammer toe fifth digit  B/L  Skin  No open lesions.  Normal skin texture and turgor.Callus on plantar aspect fifth  B/L.  Hammer toe  B/l  IE.  Discussed my finding of his foot.  Explained the callus fifth toe  B/l.  Explained his bunions may cause his forefeet to be tight in shoes.   Discussed better footgear for this patient.   RTC prn.   Gardiner Barefoot DPM

## 2018-10-28 ENCOUNTER — Telehealth: Payer: Self-pay | Admitting: Podiatry

## 2018-10-28 NOTE — Telephone Encounter (Signed)
Left message requesting a call back.

## 2018-10-28 NOTE — Telephone Encounter (Addendum)
Pt's wife, [name] requested copy of pt's diagnosis and clinicals. I see the release of 10/27/2018 information form she is authorized. Mailed LOV to pt's wife.

## 2018-10-28 NOTE — Telephone Encounter (Signed)
Pts wife called requesting a call back from the nurse, no other information was given.

## 2018-11-04 DIAGNOSIS — G301 Alzheimer's disease with late onset: Secondary | ICD-10-CM | POA: Diagnosis not present

## 2018-11-04 DIAGNOSIS — I2510 Atherosclerotic heart disease of native coronary artery without angina pectoris: Secondary | ICD-10-CM | POA: Diagnosis not present

## 2018-11-04 DIAGNOSIS — I10 Essential (primary) hypertension: Secondary | ICD-10-CM | POA: Diagnosis not present

## 2018-11-18 ENCOUNTER — Ambulatory Visit: Payer: Medicare Other | Admitting: Neurology

## 2018-11-19 ENCOUNTER — Telehealth: Payer: Self-pay | Admitting: *Deleted

## 2018-11-19 NOTE — Telephone Encounter (Signed)
Pt's wife, [name] called for more information concerning pt's lingering problem with the hammer toe and dry skin.

## 2018-11-23 ENCOUNTER — Other Ambulatory Visit: Payer: Self-pay

## 2018-11-23 ENCOUNTER — Ambulatory Visit (INDEPENDENT_AMBULATORY_CARE_PROVIDER_SITE_OTHER): Payer: Medicare Other | Admitting: Neurology

## 2018-11-23 ENCOUNTER — Encounter: Payer: Self-pay | Admitting: Neurology

## 2018-11-23 VITALS — BP 124/62 | HR 68 | Ht 70.0 in | Wt 160.0 lb

## 2018-11-23 DIAGNOSIS — F0391 Unspecified dementia with behavioral disturbance: Secondary | ICD-10-CM

## 2018-11-23 DIAGNOSIS — F03B18 Unspecified dementia, moderate, with other behavioral disturbance: Secondary | ICD-10-CM

## 2018-11-23 MED ORDER — ESCITALOPRAM OXALATE 10 MG PO TABS: ORAL_TABLET | ORAL | 3 refills | Status: DC

## 2018-11-23 MED ORDER — RIVASTIGMINE 4.6 MG/24HR TD PT24: 4.6000 mg | MEDICATED_PATCH | Freq: Every day | TRANSDERMAL | 3 refills | Status: DC

## 2018-11-23 NOTE — Patient Instructions (Signed)
1. Increase Lexapro to 10mg  daily 2. Continue Exelon patch daily 3. Follow-up in 6 months, call for any changes   FALL PRECAUTIONS: Be cautious when walking. Scan the area for obstacles that may increase the risk of trips and falls. When getting up in the mornings, sit up at the edge of the bed for a few minutes before getting out of bed. Consider elevating the bed at the head end to avoid drop of blood pressure when getting up. Walk always in a well-lit room (use night lights in the walls). Avoid area rugs or power cords from appliances in the middle of the walkways. Use a walker or a cane if necessary and consider physical therapy for balance exercise. Get your eyesight checked regularly.   HOME SAFETY: Consider the safety of the kitchen when operating appliances like stoves, microwave oven, and blender. Consider having supervision and share cooking responsibilities until no longer able to participate in those. Accidents with firearms and other hazards in the house should be identified and addressed as well.   ABILITY TO BE LEFT ALONE: If patient is unable to contact 911 operator, consider using LifeLine, or when the need is there, arrange for someone to stay with patients. Smoking is a fire hazard, consider supervision or cessation. Risk of wandering should be assessed by caregiver and if detected at any point, supervision and safe proof recommendations should be instituted.  RECOMMENDATIONS FOR ALL PATIENTS WITH MEMORY PROBLEMS: 1. Continue to exercise (Recommend 30 minutes of walking everyday, or 3 hours every week) 2. Increase social interactions - continue going to [place] and enjoy social gatherings with friends and family 3. Eat healthy, avoid fried foods and eat more fruits and vegetables 4. Maintain adequate blood pressure, blood sugar, and blood cholesterol level. Reducing the risk of stroke and cardiovascular disease also helps promoting better memory. 5. Avoid stressful situations.  Live a simple life and avoid aggravations. Organize your time and prepare for the next day in anticipation. 6. Sleep well, avoid any interruptions of sleep and avoid any distractions in the bedroom that may interfere with adequate sleep quality 7. Avoid sugar, avoid sweets as there is a strong link between excessive sugar intake, diabetes, and cognitive impairment The Mediterranean diet has been shown to help patients reduce the risk of progressive memory disorders and reduces cardiovascular risk. This includes eating fish, eat fruits and green leafy vegetables, nuts like almonds and hazelnuts, walnuts, and also use olive oil. Avoid fast foods and fried foods as much as possible. Avoid sweets and sugar as sugar use has been linked to worsening of memory function.  There is always a concern of gradual progression of memory problems. If this is the case, then we may need to adjust level of care according to patient needs. Support, both to the patient and caregiver, should then be put into place.

## 2018-11-23 NOTE — Progress Notes (Signed)
NEUROLOGY CONSULTATION NOTE  [name] MRN: [id number] DOB: 07-Mar-1941  Referring provider: Dr. [name] Primary care provider: Dr. [name]  Reason for consult:  dementia  Dear Dr Maudie Mercury:  Thank you for your kind referral of [name] for consultation of the above symptoms. Although his history is well known to you, please allow me to reiterate it for the purpose of our medical record. The patient was accompanied to the clinic by his wife [name], his daughter Winnifred Friar is present on video conference to also provides collateral information. Records and images were personally reviewed where available.   HISTORY OF PRESENT ILLNESS: This is a 78 year old left-handed man with a history of hypertension, prostate cancer, presenting to establish care for dementia. Family reports memory changes started 3-4 years ago where he would not remember names. Later on he did not recognize friends. His wife reports taking over finances in 2019, it appears he still did the taxes in 2019 (despite being diagnosed with dementia in 2018). He has been evaluated by neurologist Dr. Brett Fairy in 2018.  Arley 23/30 in March 2018, 17/30 in April 2019. He stopped driving a year ago when he would get lost or go past their house. He started going to the [place] clinic in October 2019. He refused to do [facility] testing but was noted to have cognitive deficits in multiple cognitive domains, with affective and behavioral components of delusions, visual hallucinations, irritability. No gait or sleep component. He had side effects on Donepezil and would miss doses of Rivastigmine. He was switched to the transdermal patch 4.45m daily. He had a paraneoplastic panel done which was negative[facility] he has seen Psychiatry and done well with addition of Lexapro for anxiety. It was noted that dose of rivastigmine was not increased due to syncopal episode. Diagnosis of late onset  dementia,probable Alzheimer's disease, cognitive deficits are moderate to severe.   Family initially felt that the rivastigmine patch was "overacting," so they tried administering the patch every other day. They noticed worsening and put him back on daily patches. He remembers things better and helps around the house. He can be stubborn and very demanding, wanting to get dressed and shaved early in the morning. Wife reports he is very concerned and worried, he keeps repeating shaving because it is not as good as he wants. He For the past few months, he has been rummaging a lot in the house. He does not realized ownership of things and would pick up things that are not his and start using it. He gets confused using his electric shaver. They report sleep and appetite are good. Family reports he craves sweets. Family denies any hallucinations since starting the patches. He does not read as much. They deny any paranoia, most of the time he is calm, once in a while he gets a burst and gets mad or upset. He was more emotional [place], family has not noticed this recently but do wonder about depression.   He denies any headaches, dizziness, vision changes, dysarthria/dysphagia, neck/back pain, focal numbness/tingling/weakness, bowel/bladder dysfunction, anosmia, or tremors. He denies any falls then his wife reminds him she fell and he states he was carrying a lot of things then. He states he "remembers everything."   He had an MRI brain in 03/2018 which I personally reviewed showing moderate diffuse atrophy and mild chronic microvascular disease.   PAST MEDICAL HISTORY:  Past Medical History:  Diagnosis Date   Asthma    CAD (coronary artery disease)    Cancer ([place]) 2016   prostate   Complication of anesthesia    CO2 high according to patient    Family history of ovarian cancer    GERD (gastroesophageal reflux disease)    History of melanoma    History of prostate  cancer    HTN (hypertension)    Mitral valve prolapse    Osteoporosis    TIA (transient ischemic attack)    UTI (lower urinary tract infection)     PAST SURGICAL HISTORY: Past Surgical History:  Procedure Laterality Date   APPENDECTOMY     CORONARY ANGIOPLASTY WITH STENT PLACEMENT  04/30/2010   PCI and stenting of proximal RCA   MITRAL VALVE REPAIR  06/13/2010   right mini thoracotomy for complex valvuloplasty with 31m Memo ring annuloplasty - Dr. ORoxy Manns  NAILBED REPAIR Right 05/02/2015   Procedure: BIOPSY NAIL BED RIGHT THUMB;  Surgeon: GDaryll Brod MD;  [place]  Service: Orthopedics;  Laterality: Right;  ANESTHESIA:  [facility]   PROSTATECTOMY  2016    MEDICATIONS: Current Outpatient Medications on File Prior to Visit  Medication Sig Dispense Refill   aspirin 81 MG tablet Take 81 mg by mouth daily.       atorvastatin (LIPITOR) 10 MG tablet Take 1 tablet (10 mg total) by mouth daily. 90 tablet 3   B Complex Vitamins (VITAMIN B COMPLEX PO) Take 1 tablet by mouth daily.     blood glucose meter kit and supplies KIT Dispense based on patient and insurance preference. Use up to four times daily as directed. (FOR ICD-9 250.00, 250.01). For am fasting CBG check. 1 each 1   calcium-vitamin D (OSCAL WITH D) 500-200 MG-UNIT per tablet Take 1 tablet by mouth daily.      escitalopram (LEXAPRO) 5 MG tablet Take 5 mg by mouth every morning.     Flaxseed Oil (LINSEED OIL) OIL Take 1,000 mg by mouth 2 (two) times daily.     hydrochlorothiazide (MICROZIDE) 12.5 MG capsule Take 6.25 mg by mouth daily.     L-Methylfolate-B12-B6-B2 (CEREFOLIN PO) Take 1 tablet by mouth daily.     losartan (COZAAR) 50 MG tablet Take 25 mg by mouth daily.     magnesium oxide (MAG-OX) 400 MG tablet Take 400 mg by mouth daily.     OVER THE COUNTER MEDICATION Take 1 tablet by mouth daily. ashwagandha     OVER THE COUNTER MEDICATION Take 1 tablet by mouth daily.  Brahmi     vitamin C (ASCORBIC ACID) 500 MG tablet Take 500 mg by mouth daily.     No current facility-administered medications on file prior to visit.     ALLERGIES: Allergies  Allergen Reactions   Penicillins Itching and Rash    Has patient had a PCN reaction causing immediate rash, facial/tongue/throat swelling, SOB or lightheadedness with hypotension: YES Has patient had a PCN reaction causing severe rash involving mucus membranes or skin necrosis: NO Has patient had a PCN reaction that required hospitalization: NO Has patient had a PCN reaction occurring within the last 10 years: NO If all of the above answers are "NO", then may proceed with Cephalosporin use.    FAMILY HISTORY: Family History  Problem Relation Age of Onset   Heart disease Father    Heart disease Mother    Cirrhosis Brother    Ulcers Brother  Ovarian cancer Other        d. 31    SOCIAL HISTORY: Social History   Socioeconomic History   Marital status: Married    Spouse name: Not on file   Number of children: 2   Years of education: MS   Highest education level: Not on file  Occupational History   Occupation: retired    Fish farm manager: RETIRED  Scientist, product/process development strain: Not on file   Food insecurity    Worry: Not on file    Inability: Not on file   Transportation needs    Medical: Not on file    Non-medical: Not on file  Tobacco Use   Smoking status: Never Smoker   Smokeless tobacco: Never Used  Substance and Sexual Activity   Alcohol use: Yes    Comment: SOCIAL   Drug use: No   Sexual activity: Not on file  Lifestyle   Physical activity    Days per week: Not on file    Minutes per session: Not on file   Stress: Not on file  Relationships   Social connections    Talks on phone: Not on file    Gets together: Not on file    Attends religious service: Not on file    Active member of club or organization: Not on file    Attends meetings of clubs or  organizations: Not on file    Relationship status: Not on file   Intimate partner violence    Fear of current or ex partner: Not on file    Emotionally abused: Not on file    Physically abused: Not on file    Forced sexual activity: Not on file  Other Topics Concern   Not on file  Social History Narrative   Patient drinks coffee daily     REVIEW OF SYSTEMS: Constitutional: No fevers, chills, or sweats, no generalized fatigue, change in appetite Eyes: No visual changes, double vision, eye pain Ear, nose and throat: No hearing loss, ear pain, nasal congestion, sore throat Cardiovascular: No chest pain, palpitations Respiratory:  No shortness of breath at rest or with exertion, wheezes GastrointestinaI: No nausea, vomiting, diarrhea, abdominal pain, fecal incontinence Genitourinary:  No dysuria, urinary retention or frequency Musculoskeletal:  No neck pain, back pain Integumentary: No rash, pruritus, skin lesions Neurological: as above Psychiatric: No depression, insomnia, anxiety Endocrine: No palpitations, fatigue, diaphoresis, mood swings, change in appetite, change in weight, increased thirst Hematologic/Lymphatic:  No anemia, purpura, petechiae. Allergic/Immunologic: no itchy/runny eyes, nasal congestion, recent allergic reactions, rashes  PHYSICAL EXAM: Vitals:   11/23/18 0855  BP: 124/62  Pulse: 68  SpO2: 97%   General: No acute distress. He repeatedly takes off his mask and needs repeated instructions and reminders to put it back on. Head:  Normocephalic/atraumatic Skin/Extremities: No rash, no edema Neurological Exam: Mental status: alert and oriented to person, city. No dysarthria or aphasia, Fund of knowledge is reduced.  Recent and remote memory are impaired.  Attention and concentration are reduced. Unable to name objects and repeat phrases.  MMSE - Mini Mental State Exam 11/23/2018 07/16/2017  Orientation to time 0 2  Orientation to Place 1 2  Registration 0 3    Attention/ Calculation 1 3  Recall 0 0  Language- name 2 objects 0 2  Language- repeat 0 0  Language- follow 3 step command 1 2  Language- read & follow direction 1 1  Write a sentence 0 1  Copy design 0  1  Total score 4 17   Cranial nerves: CN I: not tested CN II: pupils equal, round and reactive to light, visual fields intact CN III, IV, VI:  full range of motion, no nystagmus, no ptosis CN V: facial sensation intact CN VII: upper and lower face symmetric CN VIII: hearing intact to conversation CN IX, X: gag intact, uvula midline CN XI: sternocleidomastoid and trapezius muscles intact CN XII: tongue midline Bulk & Tone: normal, no fasciculations. Motor: 5/5 throughout with no pronator drift. Sensation: intact to light touch, cold.  Romberg test negative Deep Tendon Reflexes: +2 throughout, no ankle clonus Plantar responses: downgoing bilaterally Cerebellar: no incoordination on finger to nose testing Gait: narrow-based and steady, able to tandem walk adequately. Tremor: none  IMPRESSION: This is a 78 year old left-handed man with a history of hypertension, prostate cancer, presenting to establish care for dementia. His neurological exam today is non-focal, MMSE 4/30. I discussed with the family the diagnosis of Alzheimer's disease, prognosis and behavioral changes that occur. They are agreeable to increasing Lexapro to 30m daily. Continue Exelon patch 4.644mdaily. Continue close supervision. He does not drive. Follow-up in 6 months, they know to call for any changes.   Thank you for allowing me to participate in the care of this patient. Please do not hesitate to call for any questions or concerns.   KaEllouise NewerM.D.  CC: Dr. KiMaudie Mercury

## 2018-11-24 ENCOUNTER — Telehealth: Payer: Self-pay | Admitting: *Deleted

## 2018-11-24 ENCOUNTER — Telehealth: Payer: Self-pay | Admitting: Neurology

## 2018-11-24 NOTE — Telephone Encounter (Signed)
Pt's wife, [name] called states pt is still having pain after visit with Dr. Prudence Davidson, the callous has not been shaved down enough and is painful in the athletic shoes.

## 2018-11-24 NOTE — Telephone Encounter (Signed)
Pt wife would like to ask Dr Delice Lesch some questions  Per her VM

## 2018-11-25 DIAGNOSIS — G301 Alzheimer's disease with late onset: Secondary | ICD-10-CM | POA: Diagnosis not present

## 2018-11-26 ENCOUNTER — Telehealth: Payer: Self-pay | Admitting: *Deleted

## 2018-11-26 NOTE — Telephone Encounter (Signed)
Tried calling multiple times per day. No answer no machine

## 2018-11-26 NOTE — Telephone Encounter (Signed)
Pt's wife, [name] request to speak with our office manager concerning the services of last appt were not completed.

## 2018-11-26 NOTE — Telephone Encounter (Signed)
Wife is calling in with medication questions. Thanks!

## 2018-11-30 ENCOUNTER — Telehealth: Payer: Self-pay | Admitting: Neurology

## 2018-11-30 DIAGNOSIS — E7800 Pure hypercholesterolemia, unspecified: Secondary | ICD-10-CM | POA: Diagnosis not present

## 2018-11-30 DIAGNOSIS — I10 Essential (primary) hypertension: Secondary | ICD-10-CM | POA: Diagnosis not present

## 2018-11-30 DIAGNOSIS — L84 Corns and callosities: Secondary | ICD-10-CM | POA: Diagnosis not present

## 2018-11-30 DIAGNOSIS — G301 Alzheimer's disease with late onset: Secondary | ICD-10-CM | POA: Diagnosis not present

## 2018-11-30 NOTE — Telephone Encounter (Signed)
Wife is calling in about the alternate days of the patches. Wanting to know if that is possible. Also she was asking if he has  FTD -Frontal Temperamental Dementia. She was researching and thought he may have that and didn't know if he was tested for that DX. Thanks!

## 2018-12-01 NOTE — Telephone Encounter (Signed)
Tried calling pts wife on home and cell number. No answer and no voicemail.  Will try back.

## 2018-12-08 ENCOUNTER — Telehealth: Payer: Self-pay | Admitting: Neurology

## 2018-12-08 NOTE — Telephone Encounter (Signed)
Patient left msg with after hours about needing to know if we have any medical food tablets. Thanks!

## 2018-12-10 NOTE — Telephone Encounter (Signed)
Unable to reach at any numbers. We do not have medical food tablets.

## 2018-12-30 ENCOUNTER — Ambulatory Visit: Payer: Medicare Other | Admitting: Podiatry

## 2019-01-13 ENCOUNTER — Ambulatory Visit: Payer: Medicare Other | Admitting: Adult Health

## 2019-03-02 ENCOUNTER — Ambulatory Visit: Payer: Medicare Other | Admitting: Podiatry

## 2019-03-23 ENCOUNTER — Telehealth: Payer: Self-pay | Admitting: Neurology

## 2019-03-23 NOTE — Telephone Encounter (Signed)
Wife left vm about medication. Thanks!

## 2019-03-23 NOTE — Telephone Encounter (Signed)
Wife is calling in about new medication Rivastigmine patch source for dementia and patient is feeling disoriented and wanted to see what to do about that. Thanks!

## 2019-03-24 ENCOUNTER — Encounter: Payer: Self-pay | Admitting: Cardiology

## 2019-03-24 ENCOUNTER — Other Ambulatory Visit: Payer: Self-pay

## 2019-03-24 ENCOUNTER — Ambulatory Visit: Payer: Medicare Other | Admitting: Cardiology

## 2019-03-24 VITALS — BP 141/76 | HR 52 | Temp 98.6°F | Ht 70.0 in | Wt 162.4 lb

## 2019-03-24 DIAGNOSIS — E7800 Pure hypercholesterolemia, unspecified: Secondary | ICD-10-CM

## 2019-03-24 DIAGNOSIS — I350 Nonrheumatic aortic (valve) stenosis: Secondary | ICD-10-CM

## 2019-03-24 DIAGNOSIS — I351 Nonrheumatic aortic (valve) insufficiency: Secondary | ICD-10-CM

## 2019-03-24 DIAGNOSIS — Z9889 Other specified postprocedural states: Secondary | ICD-10-CM

## 2019-03-24 DIAGNOSIS — I2510 Atherosclerotic heart disease of native coronary artery without angina pectoris: Secondary | ICD-10-CM

## 2019-03-24 DIAGNOSIS — I10 Essential (primary) hypertension: Secondary | ICD-10-CM

## 2019-03-24 NOTE — Telephone Encounter (Signed)
Tried calling pt's wife. No answer no machine

## 2019-03-24 NOTE — Progress Notes (Signed)
Primary Physician/Referring:  [name], MD  Patient ID: [name], male    DOB: 01-06-1941, 78 y.o.   MRN: [id number]  Chief Complaint  Patient presents with  . Coronary Artery Disease  . Aortic Stenosis  . Aortic Regurgitation  . Hypertension  . Hyperlipidemia   HPI:    [name]  is a 78 y.o.  an Spel[place] Male with CAD S/P PTCA and stenting of RCA, normal other vessels by cath on 04/30/10 following which he had TIA and small stroke without residual deficit and MV repair on 06/13/10, who had been doing well, he denies any worsening dyspnea. He is being treated for restrictive lung disease from paralytic right hemidiaphragm Dr. Chase Caller.  His past medical history is significant for hypertension, hyperlipidemia, prostate cancer, S/P prostatectomy in 2017, malignant melanoma involving the thumb. S/P right thumb partial amputation in 2017, along with lymph node dissection and axilla presents here for annual visit. He was admitted to the hospital after an episode of syncope in Dec 2019. He has not had recurrence.  He is presently doing well but cognitive impairment has become a major issue.  He was essentially nonverbal today except saying yes and no.  Past Medical History:  Diagnosis Date  . Asthma   . CAD (coronary artery disease)   . Cancer [facility]) 2016   prostate  . Complication of anesthesia    CO2 high according to patient   . Family history of ovarian cancer   . GERD (gastroesophageal reflux disease)   . History of melanoma   . History of prostate cancer   . HTN (hypertension)   . Mitral valve prolapse   . Osteoporosis   . TIA (transient ischemic attack)   . UTI (lower urinary tract infection)    Past Surgical History:  Procedure Laterality Date  . APPENDECTOMY    . CORONARY ANGIOPLASTY WITH STENT PLACEMENT  04/30/2010   PCI and stenting of proximal RCA  . MITRAL VALVE REPAIR  06/13/2010   right mini thoracotomy for complex valvuloplasty with  56m Memo ring annuloplasty - Dr. ORoxy Manns . NAILBED REPAIR Right 05/02/2015   Procedure: BIOPSY NAIL BED RIGHT THUMB;  Surgeon: GDaryll Brod MD;  [place]  Service: Orthopedics;  Laterality: Right;  ANESTHESIA:  IV REGIONAL FAB  . PROSTATECTOMY  2016   Social History   Socioeconomic History  . Marital status: Married    Spouse name: Not on file  . Number of children: 2  . Years of education: MS  . Highest education level: Not on file  Occupational History  . 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 Financial resource strain: Not on file  . Food insecurity    Worry: Not on file    Inability: Not on file  . Transportation needs    Medical: Not on file    Non-medical: Not on file  Tobacco Use  . Smoking status: Never Smoker  . Smokeless tobacco: Never Used  Substance and Sexual Activity  . Alcohol use: Yes    Comment: SOCIAL  . Drug use: No  . Sexual activity: Not Currently    Partners: Female  Lifestyle  . Physical activity    Days per week: Not on file    Minutes per session: Not on file  . Stress: Not on file  Relationships  . Social [place] phone: Not on file    Gets together:  Not on file    Attends religious service: Not on file    Active member of club or organization: Not on file    Attends meetings of clubs or organizations: Not on file    Relationship status: Not on file  . Intimate partner violence    Fear of current or ex partner: Not on file    Emotionally abused: Not on file    Physically abused: Not on file    Forced sexual activity: Not on file  Other Topics Concern  . Not on file  Social History Narrative   Patient drinks coffee daily       Lives with wife, daughter and son in law      Left handed      Master's degree   ROS  Review of Systems  Constitution: Negative for chills, decreased appetite, malaise/fatigue and weight gain.  Cardiovascular: Negative for dyspnea on exertion, leg  swelling and syncope.  Endocrine: Negative for cold intolerance.  Hematologic/Lymphatic: Does not bruise/bleed easily.  Musculoskeletal: Negative for joint swelling.  Gastrointestinal: Positive for heartburn (gerd). Negative for abdominal pain, anorexia, change in bowel habit, hematochezia and melena.  Neurological: Negative for headaches and light-headedness.  Psychiatric/Behavioral: Positive for depression and memory loss. Negative for substance abuse.  All other systems reviewed and are negative.  Objective   Vitals with BMI 03/24/2019 11/23/2018 10/27/2018  Height _0  _1  -  Weight 162 lbs 6 oz 160 lbs -  BMI 16.6 06.30 -  Systolic 160 109 323  Diastolic 76 62 81  Pulse 52 68 65   Blood pressure (!) 141/76, pulse (!) 52, temperature 98.6 F (37 C), height _2  (1.778 m), weight 162 lb 6.4 oz (73.7 kg), SpO2 91 %.    Physical Exam  Constitutional: He appears well-developed and well-nourished.  HENT:  Head: Atraumatic.  Eyes: Conjunctivae are normal.  Neck: Neck supple. No JVD present. No thyromegaly present.  Cardiovascular: Normal rate, regular rhythm, intact distal pulses and normal pulses. Exam reveals no gallop.  Murmur heard.  Harsh midsystolic murmur is present with a grade of 3/6 at the upper right sternal border radiating to the neck. High-pitched blowing decrescendo early diastolic murmur is present with a grade of 2/6 at the upper right sternal border radiating to the apex. Pulses:      Carotid pulses are on the right side with bruit and on the left side with bruit. No leg edema, no JVD.  Pulmonary/Chest: Effort normal. He has decreased breath sounds in the right lower field.  Abdominal: Soft. Bowel sounds are normal.  Musculoskeletal: Normal range of motion.  Neurological: He is alert.  Skin: Skin is warm and dry.   Laboratory examination:   Recent Labs    03/28/18 1059 03/29/18 0446  NA 140 140  K 4.2 3.8  CL 103 104  CO2 28 27  GLUCOSE 80 99   BUN 18 21  CREATININE 1.00 0.97  CALCIUM 9.6 8.9  GFRNONAA >60 >60  GFRAA >60 >60   CrCl cannot be calculated (Patient's most recent lab result is older than the maximum 21 days allowed.).  CMP Latest Ref Rng & Units 03/29/2018 03/28/2018 01/02/2017  Glucose 70 - 99 mg/dL 99 80 73  BUN 8 - 23 mg/dL 21 18 22.5  Creatinine 0.61 - 1.24 mg/dL 0.97 1.00 1.1  Sodium 135 - 145 mmol/L 140 140 142  Potassium 3.5 - 5.1 mmol/L 3.8 4.2 4.3  Chloride 98 - 111 mmol/L 104 103 -  CO2 22 - 32 mmol/L 27 28 30(H)  Calcium 8.9 - 10.3 mg/dL 8.9 9.6 9.5  Total Protein 6.5 - 8.1 g/dL 5.8(L) 6.5 6.9  Total Bilirubin 0.3 - 1.2 mg/dL 1.0 0.9 0.70  Alkaline Phos 38 - 126 U/L 59 64 64  AST 15 - 41 U/L 24 28 32  ALT 0 - 44 U/L _0 CBC Latest Ref Rng & Units 03/29/2018 03/28/2018 01/02/2017  WBC 4.0 - 10.5 K/uL 7.3 8.4 6.3  Hemoglobin 13.0 - 17.0 g/dL 13.5 14.8 14.3  Hematocrit 39.0 - 52.0 % 41.4 46.1 42.5  Platelets 150 - 400 K/uL 160 163 166   Lipid Panel     Component Value Date/Time   CHOL  02/21/2010 0008    174        ATP III CLASSIFICATION:  <200     mg/dL   Desirable  200-239  mg/dL   Borderline High  >=240    mg/dL   High          TRIG 166 (H) 02/21/2010 0008   HDL 36 (L) 02/21/2010 0008   CHOLHDL 4.8 02/21/2010 0008   VLDL 33 02/21/2010 0008   LDLCALC (H) 02/21/2010 0008    105        Total Cholesterol/HDL:CHD Risk Coronary Heart Disease Risk Table                     Men   Women  1/2 Average Risk   3.4   3.3  Average Risk       5.0   4.4  2 X Average Risk   9.6   7.1  3 X Average Risk  23.4   11.0        Use the calculated Patient Ratio above and the CHD Risk Table to determine the patient's CHD Risk.        ATP III CLASSIFICATION (LDL):  <100     mg/dL   Optimal  100-129  mg/dL   Near or Above                    Optimal  130-159  mg/dL   Borderline  160-189  mg/dL   High  >190     mg/dL   Very High   HEMOGLOBIN A1C Lab Results  Component Value Date   HGBA1C 5.3  07/17/2013   MPG 105 07/17/2013   TSH No results for input(s): TSH in the last 8760 hours.   Labs 02/24/2019: HB 15.7/HCT 47.16, platelets 192.  Serum glucose 86, BUN 16, creatinine 0.98, potassium 5.1, eGFR greater than 60 him up.  CMP normal.  Total cholesterol 122, triglycerides 54, HDL 53, LDL 57.  PSA normal.  TSH normal.  03/18/2018: Cholesterol 179, triglycerides 79, HDL 71, LDL 92. Creatinine 1.1, EGFR 64/74, potassium 4.8, CMP normal.  Medications and allergies   Allergies  Allergen Reactions  . Penicillins Itching and Rash    Has patient had a PCN reaction causing immediate rash, facial/tongue/throat swelling, SOB or lightheadedness with hypotension: YES Has patient had a PCN reaction causing severe rash involving mucus membranes or skin necrosis: NO Has patient had a PCN reaction that required hospitalization: NO Has patient had a PCN reaction occurring within the last 10 years: NO If all of the above answers are "NO", then may proceed with Cephalosporin use.     Current Outpatient Medications  Medication Instructions  . aspirin 81 mg, Oral, Daily  . atorvastatin (  LIPITOR) 10 mg, Oral, Daily  . escitalopram (LEXAPRO) 10 MG tablet Take 1 tablet daily  . Flaxseed Oil (LINSEED OIL) OIL 1,000 mg, Oral, 2 times daily  . losartan (COZAAR) 25 mg, Oral, Daily  . OVER THE COUNTER MEDICATION 1 tablet, Oral, Daily, ashwagandha  . OVER THE COUNTER MEDICATION 1 tablet, Oral, Daily, Brahmi  . rivastigmine (EXELON) 4.6 mg, Transdermal, Daily  . vitamin C (ASCORBIC ACID) 500 mg, Oral, Daily    Radiology:  No results found. Cardiac Studies:   Coronary Angiography  2010-05-05: LM, LAD, Cx normal. Prox and mid LAD 80% S/P PTCA and stenting Prox RCA 3.5x20 and mid RCA 4x12 liberte non DES. IVUS guided.  Event Monitor 04/01/18: NSR. No symptoms reported.  Carotid duplex 02/27/2018: No hemodynamically significant arterial disease in the internal carotid artery bilaterally. There is mild  diffuse mixed plaque in bilateral carotid arteries. Antegrade right vertebral artery flow. Antegrade left vertebral artery flow.   Echocardiogram 02/27/2018: Left ventricle cavity is normal in size. Moderate concentric hypertrophy of the left ventricle. Normal global wall motion. Calculated EF 64%. Diastolic assessment limited due to mitral annular calcification and valvular dysfunction. Left atrial cavity is mildly dilated. Functionally bicuspid aortic valve with moderate calcification. Moderate to severe aortic valve stenosis. Aortic valve mean gradient of 33 mmHg, Vmax of 3.7 m/s. Calculated aortic valve area by continuity equation is 0.9 cm, AVAi 0.5 cm2/m2. Mild to moderate aortic regurgitation. Prior mitral valve repair with mild leaflet restriction. Mild calcific mitral valve stenosis stenosis. Mean PG 4 mmHg at HR 69 bpm. MVA 1.7 cm2 by PHT method. Mild to moderate mitral regurgitation. Mild tricuspid regurgitation. Estimated pulmonary artery systolic pressure 32 mmHg. Compared to previous study on 02/05/2017, there is further progression of aortic stenosis severity. Mild pulmonary hypertension is new.  Assessment     ICD-10-CM   1. Coronary artery disease involving native coronary artery of native heart without angina pectoris  I25.10 EKG 12-Lead    PCV ECHOCARDIOGRAM COMPLETE  2. S/P MVR (mitral valve repair)  06/13/10: With 28-mm Sorin Memo 3-D annuloplasty ring by Dr. Lilly Cove  [phone]   3. Severe aortic stenosis  I35.0 PCV ECHOCARDIOGRAM COMPLETE  4. Moderate aortic regurgitation  I35.1 PCV ECHOCARDIOGRAM COMPLETE  5. Essential hypertension  I10   6. Hypercholesteremia  E78.00     EKG 03/24/2019: Normal sinus rhythm at rate of 74 bpm, normal axis.  No evidence of ischemia.  Single PAC.  EKG 02/04/2018: Normal sinus rhythm at rate of 69 bpm, normal axis. No evidence of ischemia, normal EKG.  Recommendations:  No orders of the defined types were placed in this encounter. [name]  is a 78 y.o. Asian [place] Male with CAD S/P PTCA and stenting of RCA, normal other vessels by cath on 05-05-10 following which he had TIA and small stroke without residual deficit and MV repair on 06/13/10, who had been doing well, he denies any worsening dyspnea. He is being treated for restrictive lung disease from paralytic right hemidiaphragm Dr. Chase Caller.   Patient with known coronary artery disease and stenting to the right coronary artery in 2012 and also mitral valve disorder status post mitral repair presents here for annual visit, is presently doing well but has had significant cognitive impairment. I'm also suspecting that he has probably functional disorder.  Physical examination does not reveal any significant change in his aortic stenotic murmur and aortic regurgitation murmur.  However he does need repeat echocardiogram.  With regard to coronary  artery disease he has not had any recurrence of angina pectoris.  Lipids are well controlled and he is on appropriate medical therapy.  Hypertension is also well controlled.  I'll see him back in a year. I reviewed his PCP labs.  Adrian Prows, MD, [facility] 03/24/2019, 11:40 AM [facility]. [place] Pager: [phone] Office: [phone] If no answer Cell [phone]

## 2019-03-25 ENCOUNTER — Other Ambulatory Visit: Payer: Self-pay

## 2019-03-25 MED ORDER — ESCITALOPRAM OXALATE 10 MG PO TABS: ORAL_TABLET | ORAL | 1 refills | Status: DC

## 2019-03-25 MED ORDER — ESCITALOPRAM OXALATE 10 MG PO TABS: 10.0000 mg | ORAL_TABLET | Freq: Two times a day (BID) | ORAL | 1 refills | Status: DC

## 2019-03-25 NOTE — Telephone Encounter (Signed)
Does the pt need to switch back to the [employer]?

## 2019-03-25 NOTE — Telephone Encounter (Signed)
Pls let wife know we can increase the Lexapro 10mg  to 2 tablets daily (total dose 20mg ) and see if this helps with depression. I have an opening for virtual visit on 12/17 at 8:30am if they can do that slot, thanks

## 2019-03-25 NOTE — Telephone Encounter (Signed)
Different manufacturer. Same 4.6 strength patch. Changes patch qd. [facility] 30d supply originally. Changed to [employer] 5m supply due to cost. started 1.67m ago.  Pt is also depressed. Unsure when this started.  On ocassion gets disoriented. At times does not recognize family members. Sometimes completely fine.  No changes in meds other than the manufacturer of patch.  Not swallowing well, so puree foods only.  Wife wanted to see about treating depression. Mentioned psychology? She would also like a sooner appt. Scheduled in March as of now. Could pt have a virtual visit sooner?

## 2019-03-25 NOTE — Telephone Encounter (Signed)
Pt's wife made aware that there is no need to change manufacturers at this time.  New prescription for Lexapro 10mg  BID #180 with one refill sent to [facility] on [address].  Virtual Appt made for 04/01/19 at 8:30 am

## 2019-03-25 NOTE — Telephone Encounter (Signed)
I don't think there is a need to switch manufacturers at this point.

## 2019-03-25 NOTE — Addendum Note (Signed)
Addended by: Cameron Sprang on: 03/25/2019 11:20 AM   Modules accepted: Orders

## 2019-03-26 ENCOUNTER — Other Ambulatory Visit: Payer: Self-pay | Admitting: Cardiology

## 2019-03-31 ENCOUNTER — Encounter: Payer: Self-pay | Admitting: Neurology

## 2019-04-01 ENCOUNTER — Telehealth (INDEPENDENT_AMBULATORY_CARE_PROVIDER_SITE_OTHER): Payer: Medicare Other | Admitting: Neurology

## 2019-04-01 ENCOUNTER — Other Ambulatory Visit: Payer: Self-pay

## 2019-04-01 VITALS — Ht 68.0 in | Wt 160.0 lb

## 2019-04-01 DIAGNOSIS — F0391 Unspecified dementia with behavioral disturbance: Secondary | ICD-10-CM

## 2019-04-01 DIAGNOSIS — F03B18 Unspecified dementia, moderate, with other behavioral disturbance: Secondary | ICD-10-CM

## 2019-04-01 DIAGNOSIS — R410 Disorientation, unspecified: Secondary | ICD-10-CM

## 2019-04-01 NOTE — Progress Notes (Signed)
Virtual Visit via Video Note The purpose of this virtual visit is to provide medical care while limiting exposure to the novel coronavirus.    Consent was obtained for video visit:  Yes.   Answered questions that patient had about telehealth interaction:  Yes.   I discussed the limitations, risks, security and privacy concerns of performing an evaluation and management service by telemedicine. I also discussed with the patient that there may be a patient responsible charge related to this service. The patient expressed understanding and agreed to proceed.  Pt location: Home Physician Location: office Name of referring provider:  [name], MD I connected with [name] at patients initiation/request on 04/01/2019 at  8:30 AM EST by video enabled telemedicine application and verified that I am speaking with the correct person using two identifiers. Pt MRN:  [id number] Pt DOB:  19-Aug-1940 Video Participants:  [name];  [name] (spouse)   History of Present Illness:  The patient was seen as a virtual video visit on 04/01/2019. He was last seen 4 months ago in the neurology clinic for dementia with behavioral disturbance. His wife was present during the e-visit to provide information as he is a poor historian. Since his last visit, family reports that 6 weeks ago he seemed to have too quick of a decline. It appears he has "a permament UTI," very confused. They are trying to engage him more. He rummages frequently, wanting to use his electric shaver 6-7 times a day. He changes clothes multiple times a day. He recently got new hearing aids. Sleep is okay, no hallucinating. He can go to the bathroom on his own but usually asks for permission. He was pointing to his inner thigh 1-2 days ago, ?pain, but has not complained of this since. He has shoulder pain. His sweet intake has been really high. No falls, he has a Physiological scientist working with him twice a week. He is able to  feed himself, no difficulty swallowing, however family now has to grind his medications and put in orange juice and puree his foods. He is on the rivastigmine patch 4.6mg /day and Lexapro 10mg  daily without side effects.   History on Initial Assessment 11/23/2018: This is a 78 year old left-handed man with a history of hypertension, prostate cancer, presenting to establish care for dementia. Family reports memory changes started 3-4 years ago where he would not remember names. Later on he did not recognize friends. His wife reports taking over finances in 2019, it appears he still did the taxes in 2019 (despite being diagnosed with dementia in 2018). He has been evaluated by neurologist Dr. Brett Fairy in 2018.  [place] 23/30 in March 2018, 17/30 in April 2019. He stopped driving a year ago when he would get lost or go past their house. He started going to the [facility] in October 2019. He refused to do [facility] testing but was noted to have cognitive deficits in multiple cognitive domains, with affective and behavioral components of delusions, visual hallucinations, irritability. No gait or sleep component. He had side effects on Donepezil and would miss doses of Rivastigmine. He was switched to the transdermal patch 4.6mg  daily. He had a paraneoplastic panel done which was negative. His last visit at [facility] was 2 months ago, he has seen Psychiatry and done well with addition of Lexapro for anxiety. It was noted that dose of rivastigmine was not increased due to syncopal episode. Diagnosis of late onset dementia,probable Alzheimer's disease, cognitive  deficits are moderate to severe.   Family initially felt that the rivastigmine patch was "overacting," so they tried administering the patch every other day. They noticed worsening and put him back on daily patches. He remembers things better and helps around the house. He can be stubborn and very demanding, wanting to get dressed and shaved early in the  morning. Wife reports he is very concerned and worried, he keeps repeating shaving because it is not as good as he wants. He For the past few months, he has been rummaging a lot in the house. He does not realized ownership of things and would pick up things that are not his and start using it. He gets confused using his electric shaver. They report sleep and appetite are good. Family reports he craves sweets. Family denies any hallucinations since starting the patches. He does not read as much. They deny any paranoia, most of the time he is calm, once in a while he gets a burst and gets mad or upset. He was more emotional when they visited [place] 1.5 years ago, family has not noticed this recently but do wonder about depression.   He denies any headaches, dizziness, vision changes, dysarthria/dysphagia, neck/back pain, focal numbness/tingling/weakness, bowel/bladder dysfunction, anosmia, or tremors. He denies any falls then his wife reminds him she fell and he states he was carrying a lot of things then. He states he "remembers everything."   He had an MRI brain in 03/2018 which I personally reviewed showing moderate diffuse atrophy and mild chronic microvascular disease.    Current Outpatient Medications on File Prior to Visit  Medication Sig Dispense Refill  . aspirin 81 MG tablet Take 81 mg by mouth daily.      Marland Kitchen atorvastatin (LIPITOR) 10 MG tablet Take 1 tablet (10 mg total) by mouth daily. 90 tablet 3  . escitalopram (LEXAPRO) 10 MG tablet Take 2 tablets daily (Patient taking differently: Take 10 mg by mouth daily. Take 1 tablet daily) 180 tablet 1  . Flaxseed Oil (LINSEED OIL) OIL Take 1,000 mg by mouth 2 (two) times daily.    Marland Kitchen losartan (COZAAR) 50 MG tablet Take 25 mg by mouth daily.    Marland Kitchen OVER THE COUNTER MEDICATION Take 1 tablet by mouth daily. ashwagandha    . OVER THE COUNTER MEDICATION Take 1 tablet by mouth daily. Brahmi    . rivastigmine (EXELON) 4.6 mg/24hr Place 1 patch (4.6 mg total)  onto the skin daily. 90 patch 3  . vitamin C (ASCORBIC ACID) 500 MG tablet Take 500 mg by mouth daily.     No current facility-administered medications on file prior to visit.     Observations/Objective:   Vitals:   03/31/19 1248  Weight: 160 lb (72.6 kg)  Height: 5\' 8"  (1.727 m)   GEN:  The patient appears stated age and is in NAD.  Neurological examination: Patient is awake, alert, oriented x 2. Minimal verbal output, can follow simple commands. Remote and recent memory impaired. Unable to name and repeat. Cranial nerves: Extraocular movements intact with no nystagmus. No facial asymmetry. Motor: moves all extremities symmetrically, at least anti-gravity x 4. No incoordination on finger to nose testing. Gait: narrow-based and steady.  MMSE - Mini Mental State Exam 04/01/2019 11/23/2018 07/16/2017  Orientation to time 4 0 2  Orientation to Place 1 1 2   Registration 0 0 3  Attention/ Calculation 0 1 3  Recall 0 0 0  Language- name 2 objects 0 0 2  Language-  repeat 0 0 0  Language- follow 3 step command 0 1 2  Language- read & follow direction 1 1 1   Write a sentence 0 0 1  Copy design 0 0 1  Total score 6 4 17     Assessment and Plan:   This is a 78 yo RH man with a history of hypertension, prostate cancer, with moderate dementia, likely due to Alzheimer's disease. MMSE today 6/30. Family reports a more rapid decline with more confusion in the past 6 weeks. Head CT will be ordered to assess for underlying structural abnormality, check CBC, CMP, urinalysis. We discussed how depression can also cause these sudden changes, increase Lexapro to 20mg  daily when family is ready. His wife is asking for more support from a social worker to help navigate getting more help at home. Information for the local Alzheimer's [employer] also provided. Continue to monitor swallowing, we may do swallow evaluation if necessary. Follow-up as scheduled in March 2021, family knows to call for any changes.     Follow Up Instructions:   -I discussed the assessment and treatment plan with the patient's family. The patient's family was provided an opportunity to ask questions and all were answered. The patient's family agreed with the plan and demonstrated an understanding of the instructions.   The patient's family was advised to call back or seek an in-person evaluation if the symptoms worsen or if the condition fails to improve as anticipated.   Cameron Sprang, MD

## 2019-04-02 ENCOUNTER — Other Ambulatory Visit: Payer: Self-pay

## 2019-04-02 DIAGNOSIS — R410 Disorientation, unspecified: Secondary | ICD-10-CM

## 2019-04-02 DIAGNOSIS — F03B18 Unspecified dementia, moderate, with other behavioral disturbance: Secondary | ICD-10-CM

## 2019-04-02 DIAGNOSIS — F0391 Unspecified dementia with behavioral disturbance: Secondary | ICD-10-CM

## 2019-04-03 NOTE — Progress Notes (Signed)
Labs 02/24/2019: CBC normal, BUN 16, creatinine 0.98, eGFR greater than 60 ML, potassium 5.1, CMP normal.  Total cholesterol 122, triglycerides 54, HDL 53, LDL 57.  TSH normal.

## 2019-04-10 ENCOUNTER — Other Ambulatory Visit: Payer: Self-pay

## 2019-04-10 ENCOUNTER — Encounter (HOSPITAL_COMMUNITY): Payer: Self-pay

## 2019-04-10 ENCOUNTER — Emergency Department (HOSPITAL_COMMUNITY)
Admission: EM | Admit: 2019-04-10 | Discharge: 2019-04-10 | Disposition: A | Discharge status: Home / Self Care | Payer: Medicare Other | Attending: Emergency Medicine | Admitting: Emergency Medicine

## 2019-04-10 DIAGNOSIS — F0390 Unspecified dementia without behavioral disturbance: Secondary | ICD-10-CM | POA: Insufficient documentation

## 2019-04-10 DIAGNOSIS — Z5321 Procedure and treatment not carried out due to patient leaving prior to being seen by health care provider: Secondary | ICD-10-CM | POA: Insufficient documentation

## 2019-04-10 DIAGNOSIS — R82998 Other abnormal findings in urine: Secondary | ICD-10-CM | POA: Diagnosis not present

## 2019-04-10 DIAGNOSIS — R410 Disorientation, unspecified: Secondary | ICD-10-CM | POA: Diagnosis not present

## 2019-04-10 LAB — URINALYSIS, ROUTINE W REFLEX MICROSCOPIC
Bilirubin Urine: NEGATIVE
Glucose, UA: NEGATIVE mg/dL
Hgb urine dipstick: NEGATIVE
Ketones, ur: NEGATIVE mg/dL
Leukocytes,Ua: NEGATIVE
Nitrite: NEGATIVE
Protein, ur: 100 mg/dL — AB
Specific Gravity, Urine: 1.028 (ref 1.005–1.030)
pH: 7 (ref 5.0–8.0)

## 2019-04-10 NOTE — ED Triage Notes (Signed)
Pt arrives with wife, as he has dementia. Pt's urine has had a foul odor to it. Wife states pt is more confused on some things than others. Pt is alert at this time.

## 2019-04-13 ENCOUNTER — Other Ambulatory Visit: Payer: Self-pay

## 2019-04-13 ENCOUNTER — Other Ambulatory Visit: Payer: Self-pay | Admitting: *Deleted

## 2019-04-13 ENCOUNTER — Ambulatory Visit (INDEPENDENT_AMBULATORY_CARE_PROVIDER_SITE_OTHER): Payer: Medicare Other

## 2019-04-13 ENCOUNTER — Other Ambulatory Visit: Payer: Medicare Other

## 2019-04-13 DIAGNOSIS — I351 Nonrheumatic aortic (valve) insufficiency: Secondary | ICD-10-CM

## 2019-04-13 DIAGNOSIS — I2510 Atherosclerotic heart disease of native coronary artery without angina pectoris: Secondary | ICD-10-CM

## 2019-04-13 DIAGNOSIS — I350 Nonrheumatic aortic (valve) stenosis: Secondary | ICD-10-CM | POA: Diagnosis not present

## 2019-04-13 NOTE — Patient Outreach (Signed)
[place] [facility]) Care Management  04/13/2019  KINGSTIN ASARE December 14, 1940 [id number]    Telephone Assessment-Successful-Enrolled  RN spoke with pt who provided permission to speak with his wife [name]). Pt also provided permission to speak with his daughter [name] at anytime. RN further discussed the purpose for today's call. Wife explained pt's issues with memory and indicates pt will undergo a CT scan on tomorrow to determine pt's Dementia. States pt's recent ED visit relates to UTI/sepsis. Pt has followed up with his provider Dr. Maudie Mercury on yesterday and currently taking the prescribed antibiotics. RN stressed the importance of completing all prescribed medications. Wife verbalized an understanding and states they are crushing all medications due to pt's having difficulty swallowing his pills whole. States pt is doing better and recovering with no acute issues however wife has questions concerning the difference between Depression and Dementia. RN educated on both however encouraged wife to discuss details directly with the pt's provider to determine pt maybe exhibiting more symptoms related to a diagnosis.  Again pending a scan for tomorrow for a more definitive answer on pt's medical condition. Also inquired on pt's needing a psychiatrist versus a neurologist. Again encouraged caregiver to have a discussion with the pt's provider on pt's possible needs as this would be related to a direct referral based upon pt's needs. RN further inquired on pt's needs related to his daily care and or possible community resources based upon his current needs. Wife indicates the [place] are not consistent with their care and she may need assistance with a longer term aide services. RN offered to make a referral for social worker via [facility] to further assist with this requested (receptive). Also informed the caregiver that counseling could be provided if needed until she decides to notify the  provider on pt's need based upon the above information discussed. No immediate needs at this time related to memory however RN further discussed enrollment into the [facility] program and services along with a plan of care that was discussed (receptive). RN has informed caregiver that pt's provider Dr. Maudie Mercury would be notify of pt's disposition with [facility] services and offered to follow up in a few weeks to completed the initial assessment (receptive).   Plan: Will follow up next month with the attempt to completed the initial assessment. Will verify at that time on monthly vs quarterly virtual calls in managing this pt with [facility] services. Will also mail out Welcome Letter and Successful outreach letter concerning [facility] services.   THN CM Care Plan Problem One     Most Recent Value  Care Plan Problem One  Impaired safety-Deminished mental status/Dementia  Role Documenting the Problem One  Care Management Telephonic [place] for Problem One  Active  THN Long Term Goal   Pt will demonstrate safety measures in the home within the next 90 days.  THN Long Term Goal Start Date  04/13/19  Interventions for Problem One Long Term Goal  Will incorporate safety in and outside the home to keep pt safe from wandering and available assistance at all times. Will stress the importance of of implementing strategies to lower the risk of acute events with pt's daily care. Will offer social work consult for possible CNA services as discussed today.  THN CM Short Term Goal #1   Adherence with prescribed medications within the next 30 days  THN CM Short Term Goal #1 Start Date  04/13/19  Interventions for Short Term Goal #1  Discussed pt  completed all antibiotics related to his recent UTI and other prescribed medications to prevent acute problems from occurring.  THN CM Short Term Goal #2   Adherence with all scheduled medical appointments within the next 30 days  THN CM Short Term Goal #2 Start Date  04/13/19   Interventions for Short Term Goal #2  Will verify pt has sufficient transportation to all medical appointments and stress the importance of pt's attending all scheduled bout virtual and office visits to again prevent acute issues from occurring.      [name], RN Care Management Coordinator [place] Office [phone]

## 2019-04-14 ENCOUNTER — Other Ambulatory Visit: Payer: Medicare Other

## 2019-04-21 ENCOUNTER — Telehealth: Payer: Self-pay

## 2019-04-21 NOTE — Telephone Encounter (Signed)
LVM ECHO results.

## 2019-04-21 NOTE — Telephone Encounter (Signed)
-----   Message from Adrian Prows, MD sent at 04/19/2019 11:27 PM EST ----- Regarding: Echo Normal heart function and no change in leaky valves involving aortic valve and MV repair looks good. No change from 2019.  I sent My Chart Message as well.  JG

## 2019-04-22 ENCOUNTER — Encounter: Payer: Self-pay | Admitting: Podiatry

## 2019-04-22 ENCOUNTER — Ambulatory Visit: Payer: Medicare Other | Admitting: Podiatry

## 2019-04-22 ENCOUNTER — Ambulatory Visit (INDEPENDENT_AMBULATORY_CARE_PROVIDER_SITE_OTHER): Payer: Medicare Other

## 2019-04-22 ENCOUNTER — Other Ambulatory Visit: Payer: Self-pay

## 2019-04-22 DIAGNOSIS — L84 Corns and callosities: Secondary | ICD-10-CM

## 2019-04-22 DIAGNOSIS — B351 Tinea unguium: Secondary | ICD-10-CM | POA: Diagnosis not present

## 2019-04-22 DIAGNOSIS — M2042 Other hammer toe(s) (acquired), left foot: Secondary | ICD-10-CM | POA: Diagnosis not present

## 2019-04-22 DIAGNOSIS — M79675 Pain in left toe(s): Secondary | ICD-10-CM | POA: Diagnosis not present

## 2019-04-22 DIAGNOSIS — M2041 Other hammer toe(s) (acquired), right foot: Secondary | ICD-10-CM

## 2019-04-22 DIAGNOSIS — M7751 Other enthesopathy of right foot: Secondary | ICD-10-CM

## 2019-04-22 DIAGNOSIS — M79674 Pain in right toe(s): Secondary | ICD-10-CM

## 2019-04-22 DIAGNOSIS — M779 Enthesopathy, unspecified: Secondary | ICD-10-CM

## 2019-04-22 NOTE — Progress Notes (Signed)
Subjective:   Patient ID: [name], male   DOB: 79 y.o.   MRN: [id number]   HPI Patient presents with a lot of discomfort between the fourth and fifth digits right with keratotic lesion and fluid buildup fourth digit right that is painful when pressed.  Patient has moderate digital deformities noted bilateral and thick yellow brittle nailbeds 1-5 both feet that can become painful   ROS      Objective:  Physical Exam  Patient who has midlevel dementia who presents with caregiver who has hammertoe deformity digits 4 5 of both feet with inflammatory fluid buildup in the inner phalangeal joint digit 4 right keratotic lesion formation and thick nailbeds 1-5 both feet that he cannot take care of or his caregiver     Assessment:  Mycotic nail infection with pain 1-5 both feet with inflammatory capsulitis fourth digit right with pain and lesion formation fourth digit right     Plan:  H&P condition reviewed and today I did sterile prep and injected the inner phalangeal joint digit 4 white 2 mg dexamethasone Kenalog and then I debrided the lesion fourth right and I debrided nailbeds 1-5 both feet with no iatrogenic bleeding and reappoint for routine care  X-rays indicate significant rotation digit 5 bilateral pressing against the fourth toe bilateral      capsul

## 2019-04-23 ENCOUNTER — Ambulatory Visit
Admission: RE | Admit: 2019-04-23 | Discharge: 2019-04-23 | Disposition: A | Discharge status: Home / Self Care | Payer: Medicare Other | Source: Ambulatory Visit | Attending: Neurology | Admitting: Neurology

## 2019-04-23 DIAGNOSIS — R410 Disorientation, unspecified: Secondary | ICD-10-CM

## 2019-04-26 ENCOUNTER — Telehealth: Payer: Self-pay

## 2019-04-26 NOTE — Telephone Encounter (Signed)
-----   Message from Cameron Sprang, MD sent at 04/23/2019  2:59 PM EST ----- Pls let wife know the head CT was unchanged from prior scan, no evidence of tumor, stroke, or bleed. Thanks

## 2019-04-26 NOTE — Telephone Encounter (Signed)
Left message for wife [name]. Informed of CT results and to call with any concerns.

## 2019-04-30 ENCOUNTER — Other Ambulatory Visit: Payer: Self-pay | Admitting: *Deleted

## 2019-04-30 ENCOUNTER — Encounter: Payer: Self-pay | Admitting: *Deleted

## 2019-04-30 NOTE — Patient Outreach (Signed)
[place] [facility]) Care Management  04/30/2019  [name] 21-Sep-1940 [id number]  Telephone Assessment  RN spoke with wife today and received the initial assessment information. Also discussed the current plan of care with updates on the interventions. Several inquires from the spouse however most of the questions relates to pt contacting her provider and pharmacy. Continue to encouraged caregiver to allow pt to use his DME to prevent risk of falls/injuries. Verified no falls and pt continue to be safe both inside and outside the home. No other needs presented at this time.  THN CM Care Plan Problem One     Most Recent Value  Care Plan Problem One  Impaired safety-Deminished mental status/Dementia  Role Documenting the Problem One  Care Management Telephonic [place] for Problem One  Active  THN Long Term Goal   Pt will demonstrate safety measures in the home within the next 90 days.  THN Long Term Goal Start Date  04/13/19  Interventions for Problem One Long Term Goal  Will continue to encouraged safety measures to prevent acute events from occurring. Will continue to encouraged adherence.  THN CM Short Term Goal #1   Adherence with prescribed medications within the next 30 days  THN CM Short Term Goal #1 Start Date  04/13/19  [facility] CM Short Term Goal #1 Met Date  04/30/19  THN CM Short Term Goal #2   Adherence with all scheduled medical appointments within the next 30 days  THN CM Short Term Goal #2 Start Date  04/13/19  Interventions for Short Term Goal #2  Will continue to encourage adherence with attending all medical appointments to prevent acute issues from occurring.[name], RN Care Management Coordinator [place] Office [phone]

## 2019-05-03 ENCOUNTER — Telehealth: Payer: Self-pay | Admitting: *Deleted

## 2019-05-03 NOTE — Patient Outreach (Signed)
[place] [facility]) Care Management  05/03/2019  [name] 1940/05/06 [id number]   CSW received referral from [place], [place] for counseling related to depression/dementia and community resources for aide services. CSW called & spoke with patient's wife who states the home health agencies are not consistent with the work and wife is requesting more long term out of pocket services. CSW discussed options for adult day cares, [facility] and [facility]. Patient's wife states that they had looked into [facility] but they were not impressed by the level of mental stimulation, that patient is not that progressed with his dementia and she was afraid that if he were surrounded by those more progressed than he is that it would cause his dementia to worsen. CSW provided patient with the phone number to the Navigator with [place] as she states that she would also like to look into independent and assisted livings for down the road along with services to help him while he is at home. Patient's wife states that for now, patient is fine at home functionally and there is no worry about him wandering but they would like to be prepared for the future as they are aware this is a progressive disease.   CSW spoke with patient's wife about referral for depression/dementia counseling but wife states that she was just wondering about determining whether he is depressed or whether his dementia is just progressing to another stage. CSW reviewed patient's medications and noted that he is on Lexapro 10mg  daily and Exelon patch which per wife seem to be helping. Wife states that she does not think he would be interested in counseling at this time.   CSW encouraged patient's wife to call back with any concerns or questions but CSW will follow-up with wife in 1 month to ensure that she has received resources mailed to her.    Raynaldo Opitz, LCSW [facility]  Clinical Social  Worker cell #: [phone]

## 2019-05-06 ENCOUNTER — Telehealth: Payer: Self-pay | Admitting: Neurology

## 2019-05-06 NOTE — Telephone Encounter (Signed)
Patient's wife called and requested a virtual visit with Dr. Delice Lesch sooner than when he is scheduled on 06/24/19. She said his confusion has increased significantly over the last few days and she is concerned.

## 2019-05-06 NOTE — Telephone Encounter (Signed)
Patient is on the wait list in Epic. That's the only one we use up front.

## 2019-05-06 NOTE — Telephone Encounter (Signed)
Do you have a cancellation list up front?

## 2019-05-06 NOTE — Telephone Encounter (Signed)
Is the wait list the only option at this time for the patient? Do you want to call and check in, from a clinical perspective, with the patient's wife? If not, please advise?

## 2019-05-07 ENCOUNTER — Other Ambulatory Visit: Payer: Self-pay | Admitting: Neurology

## 2019-05-07 MED ORDER — LORAZEPAM 0.5 MG PO TABS: ORAL_TABLET | ORAL | 5 refills | Status: DC

## 2019-05-07 NOTE — Telephone Encounter (Signed)
Spoke to daughter, he has been off antibiotic for the past almost 3 weeks. He has urinary incontinence, he states he does not know why it is happening. no abnormal smell. Had a urinalysis today. He is more confused, asking how to get to the bathroom or get upstairs. Last night he wanted to go outside which is unusual. Sleeping is overall okay. They were able to get him for chair yoga and did seem ok. Answered all their questions, regarding prior scans. Would not make any changes for now until we rule out infection. If symptoms worsen over weekend, go to ER

## 2019-05-07 NOTE — Telephone Encounter (Signed)
Dr. Delice Lesch,  Does this patient need to come in and see you or have a virtual visit before March 11th?

## 2019-05-07 NOTE — Telephone Encounter (Signed)
Patient's daughter called regarding her dad and him having Extreme confusion. He has been on an Antibiotic for a Prostate Infection and has gotten a little better. She said he is eating pretty good. She said normally he has sundowners but last night at 8:00 PM he wanted to go outside in the dark. His daughter would please like you to call her. Thank you

## 2019-05-27 ENCOUNTER — Ambulatory Visit: Payer: Medicare Other | Attending: Internal Medicine

## 2019-06-01 ENCOUNTER — Telehealth: Payer: Self-pay | Admitting: Neurology

## 2019-06-01 ENCOUNTER — Other Ambulatory Visit: Payer: Self-pay

## 2019-06-01 ENCOUNTER — Telehealth: Payer: Self-pay

## 2019-06-01 MED ORDER — DIVALPROEX SODIUM 125 MG PO CSDR: 125.0000 mg | DELAYED_RELEASE_CAPSULE | Freq: Two times a day (BID) | ORAL | 0 refills | Status: DC

## 2019-06-01 NOTE — Telephone Encounter (Signed)
I saw the UA. He has minimal protein and no need to change anything. Continue to monitor unless he is having issues with BP control.

## 2019-06-01 NOTE — Telephone Encounter (Signed)
Ok to take with Lexapro. Can start with depakote sprinkles, they come in 125mg . Can start with 1 capsule for a week, and if no issues, increase to 2 capsules every night. Thanks

## 2019-06-01 NOTE — Telephone Encounter (Signed)
Pls provide information from [facility] for Respite care, also Electronics engineer. Symptoms are unfortunately part of progression of disease. We can add on a mood stabilizer, Depakote, to potentially help with mood. Main side effect can be drowsiness, so start giving every evening. If they would like to proceed, pls send Rx for Depakote ER 250mg  qhs. With incontinence, it is really starting to wear adult pull-ups. Thanks
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
## 2019-06-01 NOTE — Telephone Encounter (Signed)
Spoke with pt daughter, Madaline Brilliant to take with Lexapro. Can start with depakote sprinkles, they come in 125mg . Can start with 1 capsule for a week, and if no issues, increase to 2 capsules every night. Medication called in to [facility] [address]

## 2019-06-01 NOTE — Telephone Encounter (Signed)
Pt daughter called she would like to start the Depakote but she is asking can they crush it, get in a sprinkle or a liquid? She has to mix his medications with juice or food.  Pt isnt swallowing pills anymore. Also asking would he take the Depakote with the lexapro?

## 2019-06-01 NOTE — Telephone Encounter (Signed)
Pt wife called to inform us that pt had protein in UA. Pt wife would like to know will you be changing pt bp medication.

## 2019-06-02 ENCOUNTER — Other Ambulatory Visit: Payer: Self-pay

## 2019-06-02 MED ORDER — DIVALPROEX SODIUM 125 MG PO CSDR: 125.0000 mg | DELAYED_RELEASE_CAPSULE | Freq: Two times a day (BID) | ORAL | 0 refills | Status: DC

## 2019-06-04 ENCOUNTER — Ambulatory Visit: Payer: Self-pay | Admitting: *Deleted

## 2019-06-04 NOTE — Telephone Encounter (Signed)
Called pt no answer left a vm

## 2019-06-14 ENCOUNTER — Other Ambulatory Visit: Payer: Self-pay | Admitting: Cardiology

## 2019-06-14 ENCOUNTER — Other Ambulatory Visit: Payer: Self-pay

## 2019-06-14 MED ORDER — ESCITALOPRAM OXALATE 10 MG PO TABS: ORAL_TABLET | ORAL | 1 refills | Status: DC

## 2019-06-21 ENCOUNTER — Encounter: Payer: Self-pay | Admitting: *Deleted

## 2019-06-21 DIAGNOSIS — L608 Other nail disorders: Secondary | ICD-10-CM | POA: Insufficient documentation

## 2019-06-24 ENCOUNTER — Other Ambulatory Visit: Payer: Self-pay

## 2019-06-24 ENCOUNTER — Telehealth (INDEPENDENT_AMBULATORY_CARE_PROVIDER_SITE_OTHER): Payer: Medicare Other | Admitting: Neurology

## 2019-06-24 ENCOUNTER — Encounter: Payer: Self-pay | Admitting: Neurology

## 2019-06-24 VITALS — Ht 68.0 in | Wt 160.0 lb

## 2019-06-24 DIAGNOSIS — F03B18 Unspecified dementia, moderate, with other behavioral disturbance: Secondary | ICD-10-CM

## 2019-06-24 DIAGNOSIS — F0391 Unspecified dementia with behavioral disturbance: Secondary | ICD-10-CM | POA: Diagnosis not present

## 2019-06-24 MED ORDER — RIVASTIGMINE 4.6 MG/24HR TD PT24: 4.6000 mg | MEDICATED_PATCH | Freq: Every day | TRANSDERMAL | 3 refills | Status: DC

## 2019-06-24 MED ORDER — ESCITALOPRAM OXALATE 10 MG PO TABS: ORAL_TABLET | ORAL | 3 refills | Status: DC

## 2019-06-24 NOTE — Progress Notes (Signed)
Virtual Visit via Video Note The purpose of this virtual visit is to provide medical care while limiting exposure to the novel coronavirus.    Consent was obtained for video visit:  Yes.   Answered questions that patient had about telehealth interaction:  Yes.   I discussed the limitations, risks, security and privacy concerns of performing an evaluation and management service by telemedicine. I also discussed with the patient that there may be a patient responsible charge related to this service. The patient expressed understanding and agreed to proceed.  Pt location: Home Physician Location: office Name of referring provider:  [name], MD I connected with [name] at patients initiation/request on 06/24/2019 at 10:00 AM EST by video enabled telemedicine application and verified that I am speaking with the correct person using two identifiers. Pt MRN:  [id number] Pt DOB:  1940/06/24 Video Participants:  [name];  [name] (spouse), [name] (daughter)   History of Present Illness:  The patient had a virtual video visit on 06/24/2019. He was last seen in the neurology clinic 3 months ago for dementia with behavioral disturbance. On his last visit, family reported a quick decline that started around November. He was treated for a prostate infection in December. I personally reviewed head CT without contrast done January 2021 with no acute changes seen. Family called about behavioral changes, increasing the Lexapro to 10mg  BID did not seem to help, he was more agitated and has a "short fuse." He was also having trouble making it to the bathroom on time, with bladder and bowel incontinence. We had discussed starting low dose Depakote, however family held off on starting this as they feel he stabilized. He is doing mostly fine, depends on the day. He manages at night to get himself to the bathroom, but would put his underwear on backward. No swallowing  difficulties, they have been crushing his medications because he would not want to take pills. Appetite is good. He forgets that he has eaten, so he keeps eating. He asks Reena where her friend is, meaning her husband. He repeats himself a lot, he is attentive to family when they are sitting together at the table. Sometimes he is very vocal and expresses his feeling and they feel like he is a normal person. They have a caregiver to help, he does not like her to drive his car. When they leave him for a few minutes, there is a lot of anxiety. He is on the rivastigmine patch 4.6mg /day and Lexapro 10mg  BID without side effects.   History on Initial Assessment 11/23/2018: This is a 79 year old left-handed man with a history of hypertension, prostate cancer, presenting to establish care for dementia. Family reports memory changes started 3-4 years ago where he would not remember names. Later on he did not recognize friends. His wife reports taking over finances in 2019, it appears he still did the taxes in 2019 (despite being diagnosed with dementia in 2018). He has been evaluated by neurologist Dr. Brett Fairy in 2018.  [place] 23/30 in March 2018, 17/30 in April 2019. He stopped driving a year ago when he would get lost or go past their house. He started going to the [facility] in October 2019. He refused to do [facility] testing but was noted to have cognitive deficits in multiple cognitive domains, with affective and behavioral components of delusions, visual hallucinations, irritability. No gait or sleep component. He had side effects on Donepezil and would miss  doses of Rivastigmine. He was switched to the transdermal patch 4.6mg  daily. He had a paraneoplastic panel done which was negative. His last visit at [facility] was 2 months ago, he has seen Psychiatry and done well with addition of Lexapro for anxiety. It was noted that dose of rivastigmine was not increased due to syncopal episode. Diagnosis of late onset  dementia,probable Alzheimer's disease, cognitive deficits are moderate to severe.   Family initially felt that the rivastigmine patch was "overacting," so they tried administering the patch every other day. They noticed worsening and put him back on daily patches. He remembers things better and helps around the house. He can be stubborn and very demanding, wanting to get dressed and shaved early in the morning. Wife reports he is very concerned and worried, he keeps repeating shaving because it is not as good as he wants. He For the past few months, he has been rummaging a lot in the house. He does not realized ownership of things and would pick up things that are not his and start using it. He gets confused using his electric shaver. They report sleep and appetite are good. Family reports he craves sweets. Family denies any hallucinations since starting the patches. He does not read as much. They deny any paranoia, most of the time he is calm, once in a while he gets a burst and gets mad or upset. He was more emotional when they visited [place] 1.5 years ago, family has not noticed this recently but do wonder about depression.   He denies any headaches, dizziness, vision changes, dysarthria/dysphagia, neck/back pain, focal numbness/tingling/weakness, bowel/bladder dysfunction, anosmia, or tremors. He denies any falls then his wife reminds him she fell and he states he was carrying a lot of things then. He states he "remembers everything."   He had an MRI brain in 03/2018 which I personally reviewed showing moderate diffuse atrophy and mild chronic microvascular disease.    Current Outpatient Medications on File Prior to Visit  Medication Sig Dispense Refill  . aspirin 81 MG tablet Take 81 mg by mouth daily.      Marland Kitchen atorvastatin (LIPITOR) 10 MG tablet TAKE 1 TABLET BY MOUTH  DAILY 90 tablet 3  . Cholecalciferol 50 MCG (2000 UT) CAPS Take by mouth.    . escitalopram (LEXAPRO) 10 MG tablet Take 2 tablets  daily 180 tablet 1  . losartan (COZAAR) 50 MG tablet Take 25 mg by mouth daily.    . rivastigmine (EXELON) 4.6 mg/24hr Place 1 patch (4.6 mg total) onto the skin daily. 90 patch 3  . vitamin C (ASCORBIC ACID) 500 MG tablet Take 500 mg by mouth daily.    . divalproex (DEPAKOTE SPRINKLES) 125 MG capsule Take 1 capsule (125 mg total) by mouth 2 (two) times daily. Take one (1)  tablet at night for a week and then increase to two (2) tablets a night (Patient not taking: Reported on 06/24/2019) 180 capsule 0   No current facility-administered medications on file prior to visit.     Observations/Objective:   Vitals:   06/24/19 0843  Weight: 160 lb (72.6 kg)  Height: 5\' 8"  (1.727 m)   GEN:  The patient appears stated age and is in NAD.  Neurological examination: Patient is awake, alert, oriented x 2. He is in good spirits today, minimal verbal output, with some echolalia. Can follow simple commands. Remote and recent memory impaired. Cranial nerves: Extraocular movements intact with no nystagmus. No facial asymmetry. Motor: moves all extremities  symmetrically, at least anti-gravity x 4. No incoordination on finger to nose testing. Gait: narrow-based and steady.  Assessment and Plan:   This is a 79 yo RH man with a history of hypertension, prostate cancer, with moderate dementia, likely due to Alzheimer's disease. MMSE 6/30 in August 2020. He is on rivastigmine patch 4.6mg  daily. Family was reporting increased confusion, repeat head CT no acute changes. He was also having more behavioral changes that family feels has stabilized with Lexapro 10mg  BID. They did not start Depakote, we have agreed to hold off for now. Caregiver support provided, agree with looking into day programs. Continue 24/7 care. Follow-up in 6 months, they know to call for any changes.    Follow Up Instructions:   -I discussed the assessment and treatment plan with the patient's family. The patient's family was provided an  opportunity to ask questions and all were answered. The patient's family agreed with the plan and demonstrated an understanding of the instructions.   The patient's family was advised to call back or seek an in-person evaluation if the symptoms worsen or if the condition fails to improve as anticipated.   Cameron Sprang, MD

## 2019-07-02 ENCOUNTER — Telehealth: Payer: Self-pay | Admitting: Clinical

## 2019-07-02 NOTE — Telephone Encounter (Signed)
Pt's daughter [name] contacted me regarding dementia resources for pt. Discussed [employer] class, [facility]. Direct referral, respite funding resources, and various supports for patient and family

## 2019-07-07 ENCOUNTER — Ambulatory Visit: Payer: Medicare Other | Admitting: Pulmonary Disease

## 2019-07-15 ENCOUNTER — Other Ambulatory Visit: Payer: Self-pay | Admitting: *Deleted

## 2019-07-15 NOTE — Patient Outreach (Signed)
[facility]) Care Management  07/15/2019  [name] 09-27-1940 [id number]    Telephone Assessment-Unsuccessful  RN attempted outreach call today to pt however unsuccessful. RN able to leave a HIPAA approved voice message requesting a call back. Will further intervene on ongoing care management services with update on plan of care and pt's progress in managing his care.  Plan: Will rescheduled another outreach call over the next week.   [name], RN Care Management Coordinator [place] Office [phone]

## 2019-07-23 ENCOUNTER — Ambulatory Visit: Payer: Self-pay | Admitting: *Deleted

## 2019-07-23 ENCOUNTER — Other Ambulatory Visit: Payer: Self-pay | Admitting: *Deleted

## 2019-07-23 NOTE — Patient Outreach (Signed)
[name]) Care Management  07/23/2019  [name] Sep 14, 1940 [id number]   Telephone Assessment-Prevention Measures  RN spoke with pt's spouse today and received an update on pt's ongoing management of care. Reports pt is doing with with no falls or injuries. Pt continue to have 24/7 assistance and supervision to assist with his ongoing ADLs. Confirms ongoing adherence to all his medications and attendance to all medical appointments. Wife reports they have researched ALF and made several appointments to visit some facilities to inquired on both pt and wife entering at different levels of care. Discussed possible memory unit and educated on ALFs. Informed caregiver [facility] has social worker if needed for more facilities if needed as this RN Case manager explained the process of placement involving the FL2 form and the facilities requirements prior to placement. States the pt's provider has prescribed a memory medicine at a very low dose however feels pt may need a higher dosage based upon the pt's recent behavior. Caregiver wife has placed a call to the NP at the provider's office to possible request a larger dose that has already been discussed in the past (currently awaiting a call back from the provider's office).  Caregiver declined [facility] social worker at this due to her pending appointments and call to the provider to inquired further on what maybe needed for enrollment into the facility of choice.  Plan of care updated along with goals and interventions (adjusted accordingly). Will continue to update provider on pt's disposition with [facility]. RN will remain available if needed for ongoing assistance however will continue to follow up accordingly on the quarterly calls as requested.  [facility] CM Care Plan Problem One     Most Recent Value  Care Plan Problem One  Impaired safety-Deminished mental status/Dementia  Role Documenting the Problem One  Care Management Telephonic [place] for Problem One  Active  [facility] Long Term Goal   Pt will demonstrate safety measures in the home within the next 90 days.  [facility] Long Term Goal Start Date  04/13/19  Interventions for Problem One Long Term Goal  Will verify safety and LOC based upon pt's memory and decline in health. Will verify no falls or injuries related to pt's ADLs and pt continue to do well. Will offer any needed resources and continue to mention available assistance via [facility] for ongoing services.  [facility] CM Short Term Goal #2   Adherence with all scheduled medical appointments within the next 30 days  [facility] CM Short Term Goal #2 Start Date  04/13/19  [facility] CM Short Term Goal #2 Met Date  07/23/19      Louis Mina, RN Care Management Coordinator [place] Office [phone]

## 2019-07-26 ENCOUNTER — Telehealth: Payer: Self-pay | Admitting: Neurology

## 2019-07-26 NOTE — Telephone Encounter (Signed)
Advised. Will start the depakote 2 tablets tonight.

## 2019-07-26 NOTE — Telephone Encounter (Signed)
Patient's daughter returned call but encounter was signed off/closed already. Please call her back.

## 2019-07-26 NOTE — Telephone Encounter (Signed)
Patient's daughter called requesting a sleep aid for the patient. She said, "He is refusing to sleep, using bad words, and generally being defiant. I've just given him a melatonin to help calm him down and it seems to be helping."   She said the patient is also up to taking 2 Lexapro 10 MG daily now.  [facility] 24 hour pharmacy on [address]

## 2019-07-26 NOTE — Telephone Encounter (Signed)
Pt daughter called back no answer no voice mail will call back later

## 2019-07-26 NOTE — Telephone Encounter (Signed)
Melatonin can help, ok to continue giving. We also talked about adding on low dose Depakote, they can try this every evening (Rx was for Depakote 125mg  qhs, but can increase to 2 caps if needed). Thanks

## 2019-08-16 ENCOUNTER — Ambulatory Visit: Payer: Medicare Other | Admitting: Internal Medicine

## 2019-08-16 ENCOUNTER — Encounter: Payer: Self-pay | Admitting: Internal Medicine

## 2019-08-16 ENCOUNTER — Ambulatory Visit (INDEPENDENT_AMBULATORY_CARE_PROVIDER_SITE_OTHER): Payer: Medicare Other

## 2019-08-16 ENCOUNTER — Other Ambulatory Visit: Payer: Self-pay

## 2019-08-16 VITALS — BP 124/72 | HR 71 | Temp 98.1°F | Ht 68.0 in | Wt 164.6 lb

## 2019-08-16 DIAGNOSIS — R062 Wheezing: Secondary | ICD-10-CM

## 2019-08-16 DIAGNOSIS — J984 Other disorders of lung: Secondary | ICD-10-CM | POA: Diagnosis not present

## 2019-08-16 DIAGNOSIS — J4530 Mild persistent asthma, uncomplicated: Secondary | ICD-10-CM | POA: Diagnosis not present

## 2019-08-16 MED ORDER — BUDESONIDE 0.5 MG/2ML IN SUSP
0.5000 mg | Freq: Every day | RESPIRATORY_TRACT | 12 refills | Status: DC
Start: 2019-08-16 — End: 2020-05-31

## 2019-08-16 MED ORDER — ALBUTEROL SULFATE (2.5 MG/3ML) 0.083% IN NEBU
2.5000 mg | INHALATION_SOLUTION | Freq: Four times a day (QID) | RESPIRATORY_TRACT | 12 refills | Status: DC | PRN
Start: 2019-08-16 — End: 2020-05-31

## 2019-08-16 NOTE — Progress Notes (Signed)
HPI    #Mitral valve replacement 2011,    #S/p RCA stent for asymptomatic > 99%  RCA stenosis in 2011 prior to MVR.     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 in 2011),   #chronic multi-year waxing and waning voice hoarseness  - per hx 2014 workup at [facility] - Dx with LPR and s/p voice rehab  - symptoms improve but not resolve with H2 blockade or PPI  #chronic cough end 2014 through spring 2015 post [place] trip  -due to  GERD, post viral reactive airway and asthma  -  advair started April 2015  #Fatigue / dyspnea end 2014 through summer 2015 - following viral infecion end 2014 [place] trip and then urosepsis admission at [facility] March/early April 2015  - No ILD CT chest 07/13/13   - CPST test 08/06/13 (below) done while he was taking his half his usual coreg and while he still had cough showed - poor exercise capacity (52%) with lowered anaerobic threshld. He was wheezing bilaterally at end of test and had significant drop in his Fev1 ; confirming exercise induced asthma. In additon, he had circulatory limitation in terms of a heart rate gap and flat stroke volume. There was NO ischemic response  #Hx of Failed PFT all life   #asthma   -diagnosed spring 2015 in midst of fatigue, cough, dyspnea following viral infection end 2014 [place] trop   -on basis of abnormal PFT, CPST showing EIB and FeNO > 25 at [place] - spring 2015  #Micropulm nodules - seen CT 07/13/13   OV 01/27/2014  [name] returns for followup of chronic hoarseness, chronic cough, and asthma.  LAst visit advised advair, and aggressive gerd precaution (no fish oil, ppi and lean diet) and strick compliance with same . It appers he took his advair for a while. THis helped cough and then cough resolved. AFter this he has been only 25% compliant with advair taking it on days when he i s abit more symptomatic with  cough and hoarseness. Marland Kitchen His fatigue and dyspnea though seems independent of the cough and hoarseness and was related to his urosepsis in April 2015 and this has resolved as he reovered from it. He has been exercising in gym and feeling well. He is still bothered bu the hoarse voice and is random though H2 blockade or PPI helps. However, he feels he does not need these meds on daily basis.  Spirometry today shows significant decline in lung function cmpared to [facility] 2015. Fev1 0;96L/31%, ratio 69 (baseline march 2015 pre-bd was 1.3L/44, ratio 75 with 15% Post BD response to an fev1 1.47L/51%).  Exhaled NO - 26 and c/w asthma  He is reluctant to take MDIs. Says he wants data and feels it should be data that he charts at home in a tabular column based However, after hearing of lung function decline he is more acceptable to take his mdi on a regular basis.    Past, Family, Social reviewed: he is once again planning [place] trip 02/28/14    OV 02/22/2014  Chief Complaint  Patient presents with  . Follow-up    Pt states his SOB has improved since last OV. Pt c/o hoarseness. Pt denies cough and CP/tightness.    Feels better; improved ET with stairs. But still doing advair 1 puff once daily only. Due to go to [place] in few days. Will have  flu shot with pcp. Spirometyr today shows: VOice up and down hoarse like before without change. Good with gargling. COuld not do spirometry today todue to malfunction in wifi networkds    AcuteOV 05/31/2014   [name] presents for follow-up. I met his daughter socially a few days ago and there was concern that when he picks up a viral infection that he has a hard time bouncing back. In addition daughter was concerned about persistent hoarseness of the voice. Last saw patient in November 2015 prior to [place] trip. At that visit I insisted on continued Advair compliance. He said he did the same and did not have any respiratory problems during the trip to [place].   Now for the lastfew weeks he picked upper respiratory infection with a lot of mucus drainage. A week and with illness he was seen at his primary care physician's office and was given Z-Pak. He is not so sure the Z-Pak helped him or he improved spontaneously. In balance he thinks that some of the mucus drainage might have improved with a Z-Pak. Currently he says that he is back to his baseline health. His asthma control questionnaire 5. Scale average is 0.6 showing good control of asthma. He says he is compliant with his Advair and does not miss any dose. His chronic hoarseness continues with waxing and waning quality. We again went over this. It is clear from his description that at [facility] that they have described and diagnosed him with paradoxical vocal cord dysfunction. He does not feel that that is another need for repeat ENT evaluation.  The main concerning thing today is that his lung function continues to be severely restrictive/obstructive. It is improved compared to fall 2015 since he went on Advair but only a little bit. It still reduced compared to a year ago in March 2015. However he states that he does not have any limitations with his daily exercise routine.     OV 08/08/2014  Chief Complaint  Patient presents with  . Follow-up    Pt stated his breathing has imporved since last OV. Pt stated does not need his rescue inhaler as much as he use to. Pt denies cough, SOB, and CP/tightness.      Follow-up moderate persistent asthma  Since last visit and for every 2016 I switched him to [facility] and with this he's had significant relief. He says that this is the best inhaler ever even though this is more expensive. He does not like Advair. He says that he is able to hold his breath many more seconds then when he was on other inhalers. He likes to once a day concept. He does not want to switch to other inhalers. He prefers managing with some samples and occasionally painful 1. Currently feels  his asthma is well controlled. Asthma control questionnaire is 0.2 symptoms for the last 1 weeks adjusting very good control. 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 Follow-up    Pt states his breathing has improved since last OV. Pt denies cough and CP/tightness.     Follow-up moderate persistent asthma   He presents with his wife. He is now on low-dose BREOP mouth once daily. He likes it much better than Advair. At times he is not certain that he is getting the right quantity of 30 but overall he is doing well. Asthma control questionnaire 5. scale is 0.2 showing good control. He still has a strap on his right foot but he is able to play golf but he is limited with his exercise and he is gained weight. He thinks part of the weight gain is dutasteride's and therefore wants to reduce this inhaler. He's planning a trip to [place] in December 2016.  Currently because of asthma he says that he does not wake up in the middle of the night. When he wakes up early in the morning there are no symptoms from asthma. He has some physical limitations but this is not because of asthma. He has very little shortness of breath. He is not having any wheeze. But he does use his albuterol 1-2 puffs as needed on most days.  Past hx: dx with early stage Prostate CA at [facility]  n obs Rx   OV 02/28/2015  Chief Complaint  Patient presents with  . Follow-up    Pt  states he has a recent cold. Pt states his cough was productive but now is non prod. Pt c/o sinus congestion. Pt states his SOB at baseline. Pt denies CP/tightness and f/c/s.    follow-up moderate persistent asthma follow-up moderate persistent asthma   He is now taking Brio 3 times a week on 1 week and then 4 times a week on another week. He says his asthma is well controlled. The last 2 days he's had a cold with some yellow mucus just once a day. He is not any worse in terms of his asthma. He does not feel like he needs antibiotic. He does not feel he needs steroids. Overall he feels his asthma control is excellent. He does not have any nocturnal symptoms or daytime symptoms. He is worried about the long-term side effects of steroids and therefore he wants to continue with his 3 times a week regimen of the Brio  Past medical history  0 he is now status post prostate cancer surgery at [facility] approximately 5-6 weeks ago. He denies any postoperative pulmonary complications. However at the time of surgery when I spoke to his daughter she mentioned something about hypercapnia that was transient and brief either  perioperative or postop. Patient is unaware of this  - He has gained weight because of inactivity following his right lower extremity ankle injury and then the prostate cancer surgery. No history of any DVT. He plans to resume exercise soon  - He is up-to-date with his flu shot  OV 07/19/2015  Chief Complaint  Patient presents with  . Follow-up    Pt states his breathing is unchanged, pt states his breathing is doing well. Pt had prostate surgery in late 2016 and had melanoma removed from right thumb. Pt denies cough and CP/tightness.     Presents with wife. Last seen nov 2016. In interim has had new rt thumb melanoma diagnosis. S/p excision at [facility]. No axillary nodes. His niece died last week from ovarian cancer. HAs gained some weight. Asthma is considered stable. Reports  different tastes with different ellipta   OV 11/29/2015  Chief Complaint  Patient presents with  . Follow-up    Pt reports that his breathing is doing well. Pt denies cough/wheeze/shob/cp/tightness. Pt uses Breo every other day, but has run out and has not taken in one week.    Follow-up  mild persistent asthma on Brio  Since last visit he visited [place] and his been doing well. While in [place]. Ran out of his Brio and did not take it. He feels he been doing well without any problems. No nocturnal awakenings no albuterol rescue use. When he wakes up no symptoms of shortness of breath no cough no wheeze he wants to trial off Brio and see how he does. Nevertheless he wants a sample of Brio to keep at home for safety    ASthma Contril panel March 2015  Oct 2015 05/31/2014  2//22/16 08/08/2014  11/08/2014  02/28/2015  11/29/2015   Rx plan at visit entry    With prednisone empiric triak BREO high dose since feb   Brio 3 times a week  Off breo  Fev1 1.3L/44,% -> 1.47L 0.96/39% 1.1/37% 1.26L/43% x  x   FVC   1.6L/40% 1.75L/44% x  x   Ratio 75 69 70 72 x  x   fef 25-75%     x  x   TLC     x  x   DLCO     x  xx   ACQ-5 (1 week)   0.6  0.3 0.2 x   FeNO  25 at Dr Remus Blake office   21 ppb 08/08/2014  23 x   Rx rec at end of visit  Rec to restart advair  Start BREO Taper to low dose breo reduc eto alt day breo  xBrio 3 times a week  Off bro and monitor         OV 08/16/2019  Subjective:  Patient ID: [name], male , DOB: March 24, 1941 , age 79 y.o. , MRN: [id number] , ADDRESS: [place] [address]   08/16/2019 -   Chief Complaint  Patient presents with  . Follow-up    Last seen 11/29/15. Pt has had a lot of wheezing that is happening at night. Denies any complaints of cough or SOB.     HPI RIYAAN HEROUX 79 y.o. -   Aasthma visit. New visit because it has been 3 years since anyone in practice  . He has asthma.  He is now accompanied by his wife.  In the interim  he has developed dementia.  It is fairly advanced.  His short-term memory and most of the memories not there.  Occasionally he does not have continence of his bladder.  He requires assistance with changing clothes.  However he is able to self feed.  Wife is noticing insidious onset of significant wheezing at night.  She denies it is snoring.  Is been going on for several months.  He is not on his asthma medication anymore.  He had a CT abdomen in the last few years in the right lower base there is atelectasis/scarring.  This was the even before in 2015.  He is not using oxygen at night.  The main request is palliative control of symptoms.     ROS - per HPI     has a past medical history of Asthma, CAD (coronary artery disease), Cancer ([place]) (4627), Complication of anesthesia, Family history of ovarian cancer, GERD (gastroesophageal reflux disease),  History of melanoma, History of prostate cancer, HTN (hypertension), Mitral valve prolapse, Osteoporosis, TIA (transient ischemic attack), and UTI (lower urinary tract infection).   reports that he has never smoked. He has never used smokeless tobacco.  Past Surgical History:  Procedure Laterality Date  . APPENDECTOMY    . CORONARY ANGIOPLASTY WITH STENT PLACEMENT  04/30/2010   PCI and stenting of proximal RCA  . MITRAL VALVE REPAIR  06/13/2010   right mini thoracotomy for complex valvuloplasty with 69m Memo ring annuloplasty - Dr. ORoxy Manns . NAILBED REPAIR Right 05/02/2015   Procedure: BIOPSY NAIL BED RIGHT THUMB;  Surgeon: GDaryll Brod MD;  [place]  Service: Orthopedics;  Laterality: Right;  ANESTHESIA:  IV REGIONAL FAB  . PROSTATECTOMY  2016    Allergies  Allergen Reactions  . Penicillins Itching and Rash    Has patient had a PCN reaction causing immediate rash, facial/tongue/throat swelling, SOB or lightheadedness with hypotension: YES Has patient had a PCN reaction causing severe rash involving mucus membranes or  skin necrosis: NO Has patient had a PCN reaction that required hospitalization: NO Has patient had a PCN reaction occurring within the last 10 years: NO If all of the above answers are "NO", then may proceed with Cephalosporin use.    Immunization History  Administered Date(s) Administered  . Fluad Quad(high Dose 65+) 01/05/2019  . Influenza Split 07/08/2013  . Influenza,inj,Quad PF,6+ Mos 02/27/2015  . Influenza-Unspecified 01/13/2018  . PFIZER SARS-COV-2 Vaccination 05/07/2019, 05/28/2019  . Pneumococcal Polysaccharide-23 04/16/2011  . Zoster Recombinat (Shingrix) 03/26/2018, 09/16/2018    Family History  Problem Relation Age of Onset  . Heart disease Father   . Heart disease Mother   . Cirrhosis Brother   . Ulcers Brother   . Ovarian cancer Other        d. 519    Current Outpatient Medications:  .  aspirin 81 MG tablet, Take 81 mg by mouth daily.  , Disp: , Rfl:  .  atorvastatin (LIPITOR) 10 MG tablet, TAKE 1 TABLET BY MOUTH  DAILY, Disp: 90 tablet, Rfl: 3 .  Cholecalciferol 50 MCG (2000 UT) CAPS, Take by mouth., Disp: , Rfl:  .  divalproex (DEPAKOTE SPRINKLES) 125 MG capsule, Take 1 capsule (125 mg total) by mouth 2 (two) times daily. Take one (1)  tablet at night for a week and then increase to two (2) tablets a night, Disp: 180 capsule, Rfl: 0 .  escitalopram (LEXAPRO) 10 MG tablet, Take 1 tablet twice a day, Disp: 180 tablet, Rfl: 3 .  losartan (COZAAR) 50 MG tablet, Take 25 mg by mouth daily., Disp: , Rfl:  .  rivastigmine (EXELON) 4.6 mg/24hr, Place 1 patch (4.6 mg total) onto the skin daily., Disp: 90 patch, Rfl: 3 .  vitamin C (ASCORBIC ACID) 500 MG tablet, Take 500 mg by mouth daily., Disp: , Rfl:       Objective:   Vitals:   08/16/19 1121  BP: 124/72  Pulse: 71  Temp: 98.1 F (36.7 C)  TempSrc: Temporal  SpO2: 97%  Weight: 164 lb 9.6 oz (74.7 kg)  Height: _0  (1.727 m)    Estimated body mass index is 25.03 kg/m as calculated from the following:    Height as of this encounter: _1  (1.727 m).   Weight as of this encounter: 164 lb 9.6 oz (74.7 kg).  _2 @  Filed Weights   08/16/19 1121  Weight: 164 lb 9.6 oz (74.7 kg)  Physical Exam Sitting quietly.  Able to answer his simple question.  Follows simple commands.  Affect was examined with sitting and sleeping.  Looks a little more deconditioned than I know him in the last few years.  Overall clear to auscultation but except in the left base he has some crackles.  No wheezing.  Normal heart sounds abdomen soft.         Assessment:       ICD-10-CM   1. Mild persistent asthma without complication  H88.50   2. Wheezing  R06.2   3. Scarring of lung  J98.4        Plan:     Patient Instructions     ICD-10-CM   1. Mild persistent asthma without complication  Y77.41   2. Wheezing  R06.2   3. Scarring of lung  J98.4      Do cxr 2 view 08/16/2019  Do ONO on room air Start nebulized budesonide at night 0.69m once daily Use albuterol as needed for wheezing  Followup  - will call with results  - video or telephone visit in 3-4 weeks with Dr RChase Calleror APP to see  Symptom progress and need for CT chest     SIGNATURE    Dr. MBrand Males M.D., F.C.C.P,  Pulmonary and Critical Care Medicine Staff Physician, CAmherstDirector - Interstitial Lung Disease  Program  [place]ulmonary [place] [address] Pager: [phone] If no answer or between  15:00h - 7:00h: call [phone] Telephone: [phone]  11:58 AM 08/16/2019

## 2019-08-16 NOTE — Patient Instructions (Signed)
ICD-10-CM   1. Mild persistent asthma without complication  A999333   2. Wheezing  R06.2   3. Scarring of lung  J98.4      Do cxr 2 view 08/16/2019  Do ONO on room air Start nebulized budesonide at night 0.5mg  once daily Use albuterol as needed for wheezing  Followup  - will call with results  - video or telephone visit in 3-4 weeks with Dr Chase Caller or APP to see  Symptom progress and need for CT chest

## 2019-08-17 NOTE — Progress Notes (Signed)
Let wife know (patient has dementia) that cxr is unchanged. Has right diaphrgam elevation only

## 2019-08-18 ENCOUNTER — Telehealth: Payer: Self-pay | Admitting: Internal Medicine

## 2019-08-18 NOTE — Telephone Encounter (Signed)
Called and spoke with pt's wife [name] letting her know the results of pt's cxr and she verbalized understanding. Nothing further needed.

## 2019-08-24 ENCOUNTER — Telehealth: Payer: Self-pay | Admitting: Neurology

## 2019-08-24 NOTE — Telephone Encounter (Signed)
Patient's wife called requesting to speak with a social worker about her husband. She is aware our old Education officer, museum, Judson Roch, has left the practice. She has some questions about transitioning her husband to long term care.

## 2019-08-24 NOTE — Telephone Encounter (Signed)
Would try the information Judson Roch provided:   [name]. [name], MSW, LCSW  Education and [facility]  Alzheimer's [place] [id number]  [place], [place]  [address]  p [phone]   f [phone]  [email or web address]  [email or web address]

## 2019-08-24 NOTE — Telephone Encounter (Signed)
I tried to call twice phone didn't ring it would click and then hang up, I sent a my chart message with contact information  [name]. [name], MSW, LCSW Education and [facility]  Alzheimer's [place] [id number]  [place], [place] [address]  [phone] .[phone]

## 2019-08-25 ENCOUNTER — Telehealth: Payer: Self-pay | Admitting: Internal Medicine

## 2019-08-25 NOTE — Telephone Encounter (Signed)
Left message for patient to call back  

## 2019-08-26 NOTE — Telephone Encounter (Signed)
ATC patient X2 LMTCB 

## 2019-08-27 ENCOUNTER — Telehealth: Payer: Self-pay | Admitting: Neurology

## 2019-08-27 ENCOUNTER — Telehealth: Payer: Self-pay | Admitting: Internal Medicine

## 2019-08-27 NOTE — Telephone Encounter (Signed)
Called and spoke with pt's wife [name] who stated they are trying to do the ONO on pt but it is hard due to pt having dementia and he is not keeping the device on more than about 5 minutes at a time. She was going to check with [employer] to see when she would need to return the device but also wanted to know if there was any recommendations that could be given that might help out with them getting the ONO performed on pt since he does have dementia.  MR, please advise.

## 2019-08-27 NOTE — Telephone Encounter (Signed)
ATC patient LMTCB X3 per protocol will close encounter.

## 2019-08-27 NOTE — Telephone Encounter (Signed)
Patient's daughter Mearl Latin called in about the patient's divalproex. He is more engaging, but is tired all the time and wants to stay in bed. She would like some advice.

## 2019-08-27 NOTE — Telephone Encounter (Signed)
Patient returned call to [facility]. She left a message requesting a call back.

## 2019-08-27 NOTE — Telephone Encounter (Signed)
Pt daughter called try going back again to 1 tab qhs of the Divalproex. Also give the Lexapro at bedtime, if they are giving it during the day. Also makes sure with PCP that no other issue going on like anemia or liver/kidney that can cause fatigue. Pt daughter verbalized understanding she will call pt PCP on Monday

## 2019-08-27 NOTE — Telephone Encounter (Signed)
We will have to find a balance, she can try going back again to 1 tab qhs of the Divalproex. Also give the Lexapro at bedtime, if they are giving it during the day. Also makes sure with PCP that no other issue going on like anemia or liver/kidney that can cause fatigue. Thanks

## 2019-09-01 ENCOUNTER — Telehealth: Payer: Self-pay | Admitting: Neurology

## 2019-09-01 NOTE — Telephone Encounter (Signed)
I do not have them either

## 2019-09-01 NOTE — Telephone Encounter (Signed)
Patient's wife called in and wanted advice on what to do about the patient sleeping a lot, but not in a deep sleep. Will still open eyes and move around, but seems asleep.

## 2019-09-01 NOTE — Telephone Encounter (Signed)
Patient wife advised per Dr.Aquino to reduce Depakote to one tablets po qhs x 3 days, if no change ,can stop depakote. Medication correct.

## 2019-09-01 NOTE — Telephone Encounter (Signed)
Pt called

## 2019-09-01 NOTE — Telephone Encounter (Signed)
Patients daughter called to verify that patients labs were received from Vassie Moment at [facility]. Please call

## 2019-09-01 NOTE — Telephone Encounter (Signed)
I do not see any labs, have you seen any?

## 2019-09-06 NOTE — Telephone Encounter (Signed)
Called [employer] and spoke with Adonis Brook in the [employer] who reported that she spoke with patient's wife a few days ago and had was informed that since patient had such difficulty taking the ONO, the wife decided to wear it on his behalf ("since she has COPD") and asked that [place] submit the results under the spouse's name.  Adonis Brook with [employer] informed spouse that this is cannot be done because an order was not received for the ONO to be done on the spouse.    ONO device was returned to [place] and was not completed on the patient. Routing to Dr Chase Caller to make him aware.

## 2019-09-06 NOTE — Telephone Encounter (Signed)
Ok   1. No ono for patient due to dementia 2. Ok to order ONO for spouse - due to dyspnea, had ILD changes on her CT. Spouse [name] also my patient - you can do it in her chart  THanks    SIGNATURE    Dr. Brand Males, M.D., F.C.C.P,  Pulmonary and Critical Care Medicine Staff Physician, [place] Director - Interstitial Lung Disease  Program  Pulmonary [place] at [place], [place], [address]  Pager: [phone], If no answer or between  15:00h - 7:00h: call [phone] Telephone: [phone]  4:51 PM 09/06/2019

## 2019-09-06 NOTE — Telephone Encounter (Signed)
Spoke with the pt's spouse and notified of response per Dr Chase Caller  She verbalized understanding  Order for her ONO on RA was sent

## 2019-09-14 ENCOUNTER — Telehealth: Payer: Self-pay | Admitting: Neurology

## 2019-09-14 NOTE — Telephone Encounter (Signed)
Pls let her know I received bloodwork done 5/17, overall okay. There was mild dehydration (BUN 34) but kidney and liver function good. No infection on urinalysis.

## 2019-09-14 NOTE — Telephone Encounter (Signed)
Pt wife called no answer no voice mail picked up will try again later

## 2019-09-14 NOTE — Telephone Encounter (Signed)
Patient's wife called in wanting to verify if we received test results and what Dr. Delice Lesch thinks about them.

## 2019-09-15 NOTE — Telephone Encounter (Signed)
Pt wife called no answer no voice mail picked up will try again later

## 2019-09-15 NOTE — Telephone Encounter (Signed)
Mychart message sent to the pt.

## 2019-09-28 ENCOUNTER — Telehealth: Payer: Self-pay | Admitting: Neurology

## 2019-09-28 NOTE — Telephone Encounter (Signed)
Patient's wife called with concerns the patient is "sleeping a lot and seems out of it."  She'd also like to know if recent lab results are back.

## 2019-09-28 NOTE — Telephone Encounter (Signed)
Attempted to call wife but phone would ring, click a couple of times then cut off. Left voicemail with pts daughter and asked her to return call to advise on her mother's concerns.

## 2019-09-29 ENCOUNTER — Telehealth: Payer: Self-pay

## 2019-09-29 NOTE — Telephone Encounter (Signed)
Dr Delice Lesch received bloodwork done 5/17, overall everything looks okay. There was mild dehydration (BUN 34) but kidney and liver function good. No infection on urinalysis.

## 2019-09-29 NOTE — Telephone Encounter (Signed)
Agree with checking for another UTI with urinalysis, and if no infection, agree with higher level of care. Thanks

## 2019-10-14 ENCOUNTER — Other Ambulatory Visit: Payer: Self-pay | Admitting: *Deleted

## 2019-10-14 NOTE — Patient Outreach (Signed)
[facility]) Care Management  10/14/2019  [name] 11/07/1940 [id number]   Case closure  RN spoke with pt's spouse today who reports pt is doing well however continue to have memory issues with some medication adjustments. Pt's daughter is a doctor and assist spouse with most inquires concerning the pt's management of care. No acute issues have occurred as spouse denies any falls. Pt continues to exhibit safety measures with no accidents or issues related.  All goals discussed and met with no additional needs at this time. Case will be closed and provider notified of pt's disposition with [facility] services.  [facility] CM Care Plan Problem One     Most Recent Value  Care Plan Problem One Impaired safety-Deminished mental status/Dementia  Role Documenting the Problem One Care Management Telephonic [place] for Problem One Active  THN Long Term Goal  Pt will demonstrate safety measures in the home within the next 90 days.  [facility] Long Term Goal Start Date 04/13/19  [facility] Long Term Goal Met Date 10/14/19      Louis Mina, RN Care Management Coordinator [place] Office [phone]

## 2019-11-05 ENCOUNTER — Telehealth: Payer: Medicare Other | Admitting: Internal Medicine

## 2019-12-14 ENCOUNTER — Telehealth: Payer: Self-pay | Admitting: Nurse Practitioner

## 2019-12-14 NOTE — Telephone Encounter (Signed)
Phone call placed to patient to offer to schedule a visit with [facility]. Phone rang, with no answer I left a voicemail for call back. 

## 2019-12-15 ENCOUNTER — Telehealth: Payer: Self-pay | Admitting: Nurse Practitioner

## 2019-12-15 NOTE — Telephone Encounter (Signed)
Rec'd message that patient's daughter, [name], called to schedule the Palliative Consult.  Called daughter back and have scheduled an In-person Consult for 12/28/19 @ 11 AM. Daughter stated that patient goes to [facility] but is home on Tuesdays.

## 2019-12-28 ENCOUNTER — Encounter: Payer: Self-pay | Admitting: Nurse Practitioner

## 2019-12-28 ENCOUNTER — Other Ambulatory Visit: Payer: Self-pay

## 2019-12-28 ENCOUNTER — Other Ambulatory Visit: Payer: Medicare Other | Admitting: Nurse Practitioner

## 2019-12-28 DIAGNOSIS — F0391 Unspecified dementia with behavioral disturbance: Secondary | ICD-10-CM

## 2019-12-28 DIAGNOSIS — Z515 Encounter for palliative care: Secondary | ICD-10-CM

## 2019-12-28 NOTE — Progress Notes (Signed)
[facility] Consult Note Telephone: [phone]  Fax: [phone]  PATIENT NAME: [name] [address] [place] [address] [phone] (home) [phone] (work) DOB: [birth year] MRN: [id number]  PRIMARY CARE PROVIDER:    [name], MD,  [place] [address] [phone]  REFERRING PROVIDER:   [name], MD [place],  [place] [address] [phone]  RESPONSIBLE PARTY:   Extended Emergency Contact Information Primary Emergency Contact: Petre,[name] Address: [address]          [place], [place] [address] [name] of [place] Phone: [phone] Mobile Phone: [phone] Relation: Spouse Secondary Emergency Contact: [place] Phone: [phone] Relation: Daughter  I met face to face with patient and family in home.  ASSESSMENT AND RECOMMENDATIONS:   1. Advance Care Planning/Goals of Care: Goals include to maximize quality of life and symptom management. Our advance care planning conversation included a discussion about:     The value and importance of advance care planning   Exploration of personal, cultural or spiritual beliefs that might influence medical decisions   Exploration of goals of care in the event of a sudden injury or illness    Review and updating or creation of an  advance directive document Most of visit and discussion was done with patient's wife and daughter [name], because patient was uncooperative, refused to sit during discussion. Visit today consisted of building trust and discussions on Palliative care medicine as specialized medical care for people living with serious illness, aimed at facilitating better quality of life through symptoms management/relief, assisting with advance care plan and establishing goals of care. Spouse and daughter [name] present during visit. Family expressed  appreciation for education provided on Palliative care and how it differs from Hospice care service. Palliative care will continue to provide support to patient, family and the medical team.   Goal of care: Per family, patient's goal of care is comfort and to keep patient at home. Wife expressed concern about patient's rapid decline in cognition and patient's current poor quality of life as patient unable to participate in activities that he used to enjoy doing.  Directives: Patient has a living will. After discussions on ramifications and implications of code status, family elected for patient to not be resuscitated in the event of cardiac or respiratory arrest. Importance of MOST form discussed with family, copy left with family to review. Plan to review and complete at next visit. Wife asked if there is a way to prepare a document for mercy dying, wife was advised that human euthanasia is illegal in the [place].  2. Symptom Management: Patient with behavioral issues related to advance dementia. Family report patient sleeping a lot, not cooperative with taking showers and personal care, aggression towards paid caregivers not allowing them to provide personal care to him. Family report patient was taken to an outdoor event yesterday and patient began to scream, shout and became very agitated. Patient has an order for Depakote for behavior concern, family report patient not taking at this time due to excessive sedation and family believing that it's not relieving patient's symptoms, patient on Lexapro 43m. Family requesting for assistance with getting more help with his care, family want a personal care aid preferably a male care-giver. Family asking if a care giver that speaks Hindi can be arranged given patient regression in language- patient does not speak EVanuatulanguage and seem not to understand the  language either. Family made aware that palliative care does not provided personal care aide but we can  assist by providing list of personal care agencies. Patient currently goes to [facility] Monday, Wednesday, Thursday and Friday 10AM-2Pm. Family looking to secure a spot for him at a all-day care program- patient on waiting list. Discussed possibility of doing bed baths for patient to reduce patient's agitation.   Care giver burn-out: Reena requesting couselling for patient's spouse, saying patient's wife is seeing a psychiatrist but needs counseling also. Will reach out to palliative care social worker for assistance with recommendation.  3. Follow up Palliative Care Visit: Palliative care will continue to follow for goals of care clarification and symptom management. Return 4 weeks or prn.  4. Family /Caregiver/Community Supports: Patient lives at home with family. Daughter [name] and her husband moved in with patient and wife to help care for him. Patient has lived in [place] for [place], married to his wife for 81 years, has 2 daughters. Has very supportive [place] community.  5. 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 Patient currently dependent on family for all of his ADls, he sometimes able to feed self. Patient able to recognize his family members. Patient walks independently without assistive device. No report of recent fall.  I spent 90 minutes providing this consultation, time includes time spent with patient/family, chart review, provider coordination, and documentation. More than 50% of the time in this consultation was spent coordinating communication.   HISTORY OF PRESENT ILLNESS:  [name] is a 79 y.o. year old male with multiple medical problems including Dementia (FAST 6d), Asthma, CAD. Palliative Care was asked to follow this patient by consultation request of [name], MD to help address advance care planning and goals of care.  This  is an initial visit.  CODE STATUS: DNR  PPS: 50%  HOSPICE ELIGIBILITY/DIAGNOSIS: TBD  PAST MEDICAL HISTORY:  Past Medical History:  Diagnosis Date  . Asthma   . CAD (coronary artery disease)   . Cancer [facility]) 2016   prostate  . Complication of anesthesia    CO2 high according to patient   . Family history of ovarian cancer   . GERD (gastroesophageal reflux disease)   . History of melanoma   . History of prostate cancer   . HTN (hypertension)   . Mitral valve prolapse   . Osteoporosis   . TIA (transient ischemic attack)   . UTI (lower urinary tract infection)     SOCIAL HX:  Social History   Tobacco Use  . Smoking status: Never Smoker  . Smokeless tobacco: Never Used  Substance Use Topics  . Alcohol use: Yes    Comment: SOCIAL   FAMILY HX:  Family History  Problem Relation Age of Onset  . Heart disease Father   . Heart disease Mother   . Cirrhosis Brother   . Ulcers Brother   . Ovarian cancer Other        d. 46    ALLERGIES:  Allergies  Allergen Reactions  . Penicillins Itching and Rash    Has patient had a PCN reaction causing immediate rash, facial/tongue/throat swelling, SOB or lightheadedness with hypotension: YES Has patient had a PCN reaction causing severe rash involving mucus membranes or skin necrosis: NO Has patient had a PCN reaction that required hospitalization: NO Has patient had a PCN reaction  occurring within the last 10 years: NO If all of the above answers are "NO", then may proceed with Cephalosporin use.     PERTINENT MEDICATIONS:  Outpatient Encounter Medications as of 12/28/2019  Medication Sig  . albuterol (PROVENTIL) (2.5 MG/3ML) 0.083% nebulizer solution Take 3 mLs (2.5 mg total) by nebulization every 6 (six) hours as needed for wheezing or shortness of breath.  Marland Kitchen aspirin 81 MG tablet Take 81 mg by mouth daily.    Marland Kitchen atorvastatin (LIPITOR) 10 MG tablet TAKE 1 TABLET BY MOUTH  DAILY  . budesonide (PULMICORT) 0.5 MG/2ML nebulizer  solution Take 2 mLs (0.5 mg total) by nebulization daily.  . Cholecalciferol 50 MCG (2000 UT) CAPS Take by mouth.  . divalproex (DEPAKOTE SPRINKLES) 125 MG capsule Take 1 capsule (125 mg total) by mouth 2 (two) times daily. Take one (1)  tablet at night for a week and then increase to two (2) tablets a night  . escitalopram (LEXAPRO) 10 MG tablet Take 1 tablet twice a day  . losartan (COZAAR) 50 MG tablet Take 25 mg by mouth daily.  . rivastigmine (EXELON) 4.6 mg/24hr Place 1 patch (4.6 mg total) onto the skin daily.  . vitamin C (ASCORBIC ACID) 500 MG tablet Take 500 mg by mouth daily.   No facility-administered encounter medications on file as of 12/28/2019.    PHYSICAL EXAM / ROS:   Current and past weights: stable, 160lbs, BMI 24.3kg/m2 General: NAD, well appearing, pacing within his house, uncooperative with some exam Cardiovascular: no chest pain reported, no edema, S1S2 normal Pulmonary: no cough, no wheezing, no increased SOB, room air Abdomen: appetite fair, no report of constipation, continent of bowel but unable to clean self. GU: no report of dysuria, incontinent of urine MSK:  no joint and ROM abnormalities, ambulatory Skin: no rashes or wounds reported or noted on exposed skin Neurological: weakness, but otherwise nonfocal    [name], DNP, AGPCNP-BC

## 2020-01-03 ENCOUNTER — Telehealth: Payer: Self-pay

## 2020-01-03 NOTE — Telephone Encounter (Signed)
(  1:10p) Palliative care completed follow-up call with daughter. SW received daughter's voicemail and left a message requesting a call back.

## 2020-01-04 ENCOUNTER — Telehealth: Payer: Self-pay | Admitting: *Deleted

## 2020-01-04 NOTE — Telephone Encounter (Signed)
Pt wife called, has a question about COPD if it connected to husband neuo problems. Please call [phone]

## 2020-01-04 NOTE — Telephone Encounter (Signed)
Called the wife back. Pt's wife states that he saw Dr Brett Fairy a while back. She was asking if he can continue to follow here. Advised the patient that since seeing Dr Brett Fairy he has followed with two other neurologist. Informed that he really needs to stick with one and most recently he had seen Dr Delice Lesch. Advised he would need to continue care with her. Patient is asking for medical records to have from our office. Informed her I would pass this along to our medical records personnel and that she will contact her once they are ready.

## 2020-01-10 IMAGING — MR MR HEAD W/O CM
10 series · 48 of 48 positions shown · non-contrast
Comparison: Head CT 4950 hours today. Brain MRI 07/04/2016 and
earlier.

CLINICAL DATA: 77-year-old male with syncope, confusion.

EXAM:
MRI HEAD WITHOUT CONTRAST
TECHNIQUE: Multiplanar, multiecho pulse sequences of the brain and surrounding
structures were obtained without intravenous contrast.

[Series 3: DWI · axial · 3.0mm · 0.94mm/px · z∈[-83,+79]mm · 12 of 110 slices shown (1 of 2)]
[im 1/110]
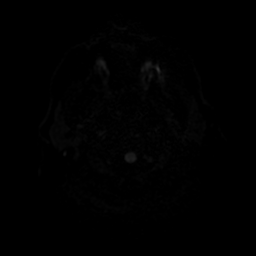
[im 10/110]
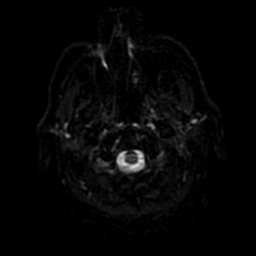
[im 20/110]
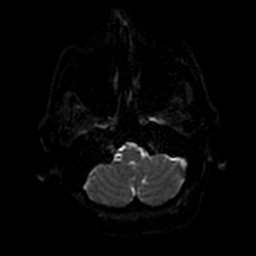
[im 30/110]
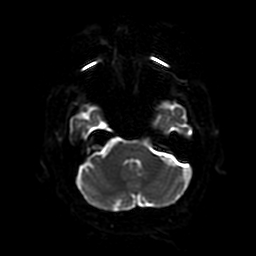
[im 40/110]
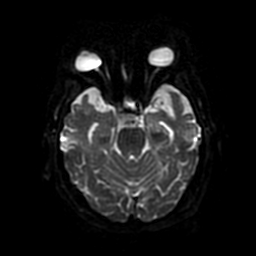
[im 50/110]
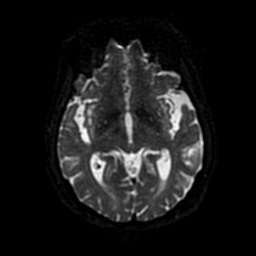
[im 60/110]
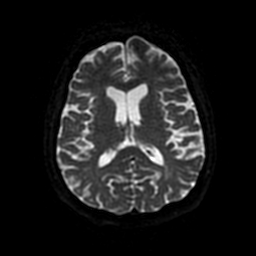
[im 70/110]
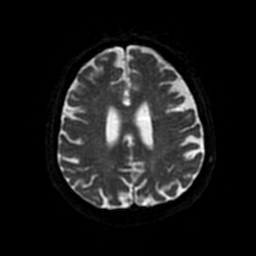
[im 80/110]
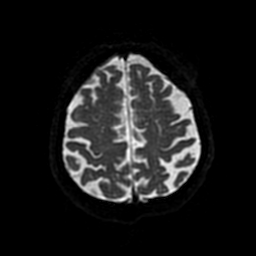
[im 90/110]
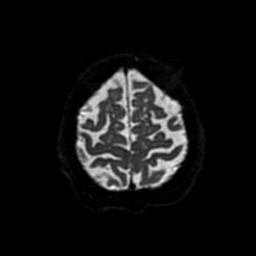
[im 100/110]
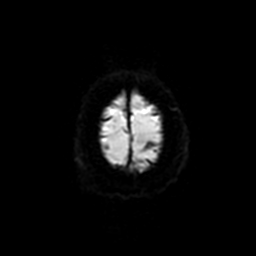
[im 110/110]
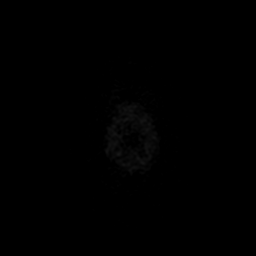

[Series 4: DWI · coronal · 4.0mm · 0.94mm/px · 8 of 74 slices shown (2 of 2)]
[im 1/74]
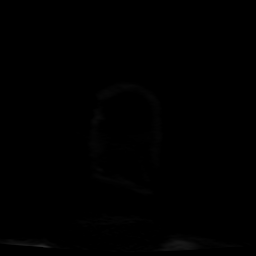
[im 11/74]
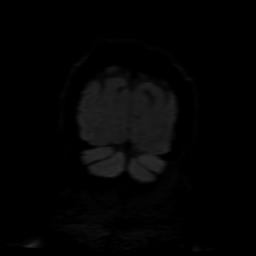
[im 21/74]
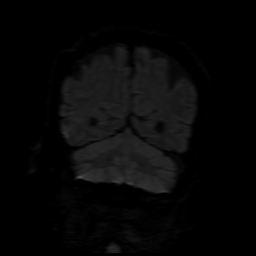
[im 32/74]
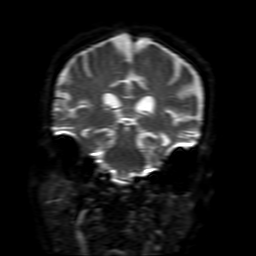
[im 42/74]
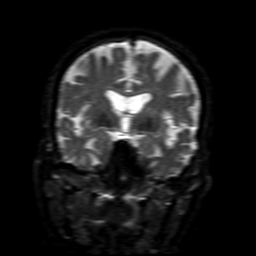
[im 53/74]
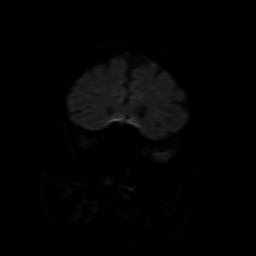
[im 63/74]
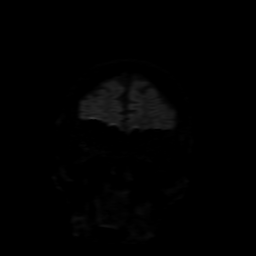
[im 74/74]
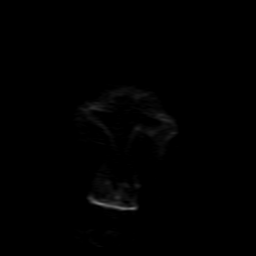

[Series 5: FLAIR · sagittal · 5.0mm · 0.47mm/px · 3 of 28 slices shown (1 of 2)]
[im 1/28]
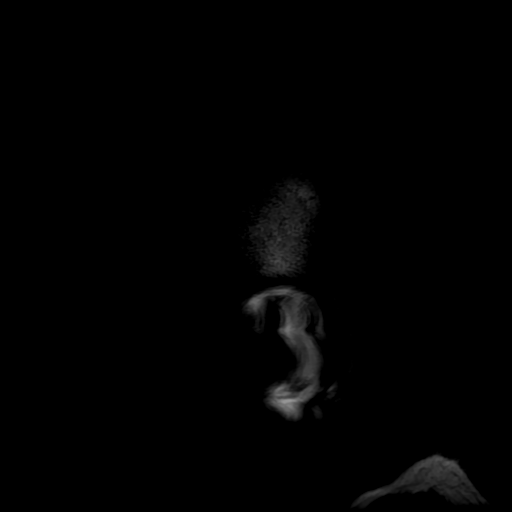
[im 14/28]
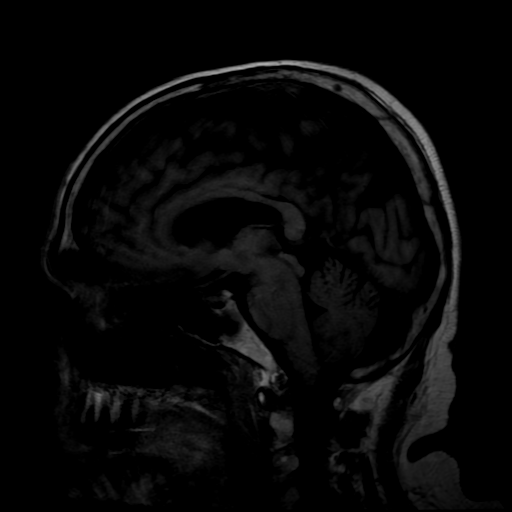
[im 28/28]
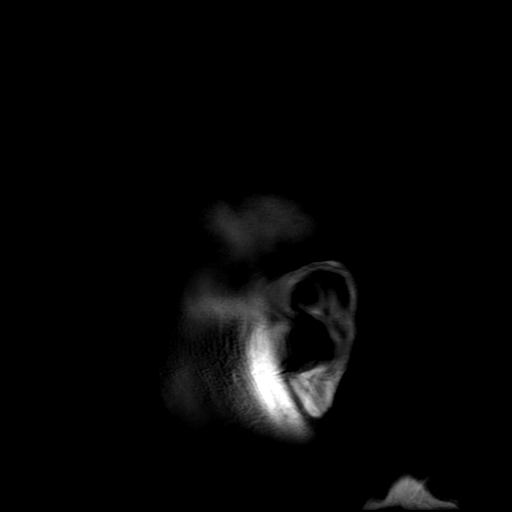

[Series 7: T2 · axial · 5.0mm · 0.47mm/px · z∈[-83,+79]mm · 3 of 28 slices shown (1 of 2)]
[im 1/28]
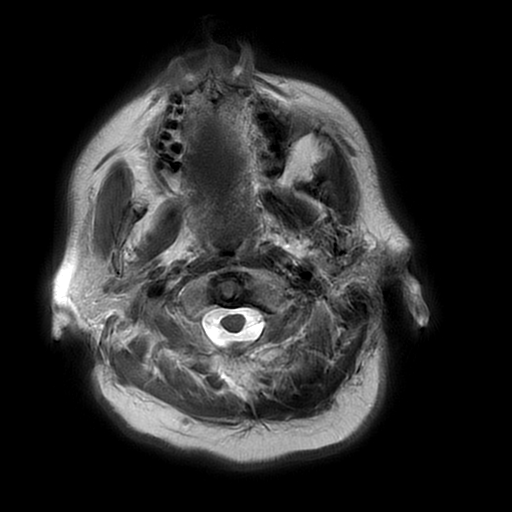
[im 14/28]
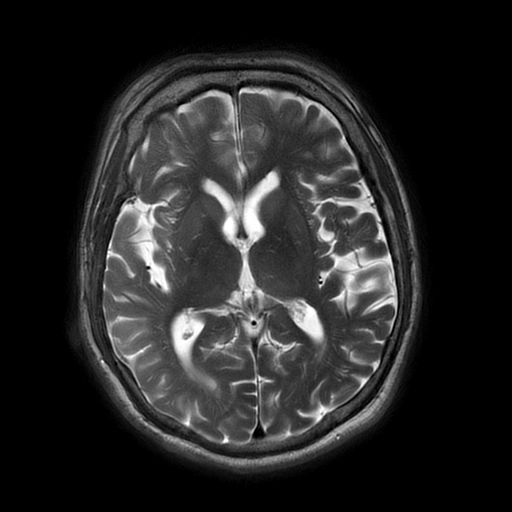
[im 28/28]
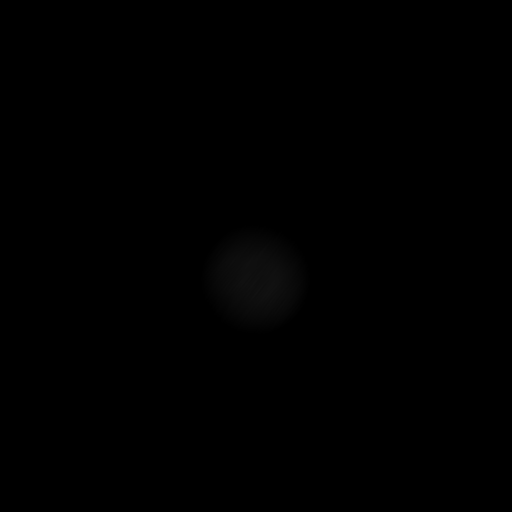

[Series 8: T1 · axial · 5.0mm · 0.94mm/px · z∈[-83,+79]mm · 3 of 28 slices shown]
[im 1/28]
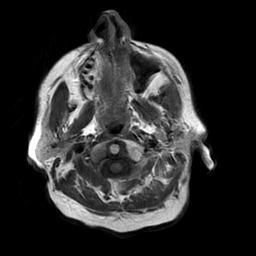
[im 14/28]
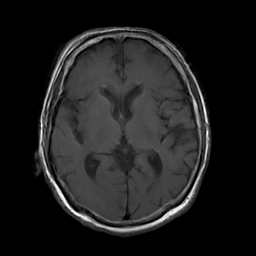
[im 28/28]
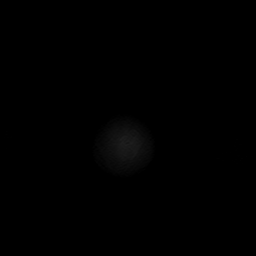

[Series 9: FLAIR · axial · 5.0mm · 0.94mm/px · z∈[-83,+79]mm · 3 of 28 slices shown (2 of 2)]
[im 1/28]
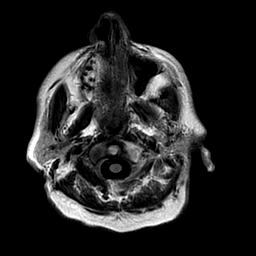
[im 14/28]
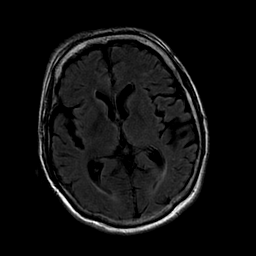
[im 28/28]
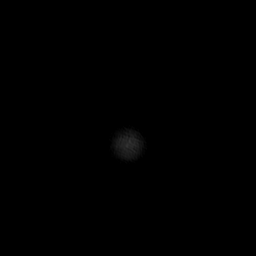

[Series 10: GRE · axial · 5.0mm · 1.09mm/px · z∈[-84,+81]mm · 3 of 31 slices shown]
[im 1/31]
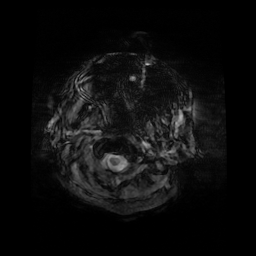
[im 16/31]
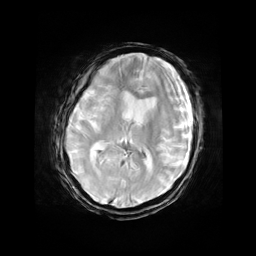
[im 31/31]
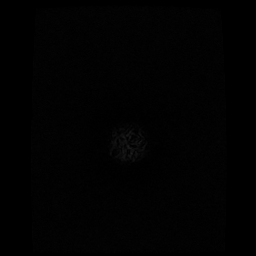

[Series 11: T2 · coronal · 5.0mm · 0.86mm/px · 3 of 30 slices shown (2 of 2)]
[im 1/30]
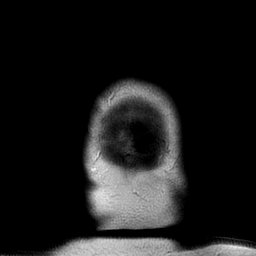
[im 15/30]
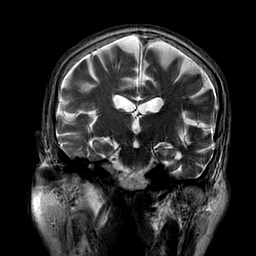
[im 30/30]
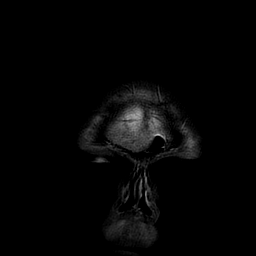

[Series 350: ADC · axial · 3.0mm · 0.94mm/px · z∈[-83,+79]mm · 6 of 55 slices shown (1 of 2)]
[im 1/55]
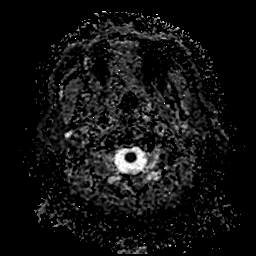
[im 11/55]
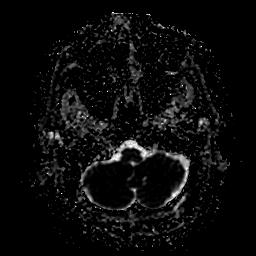
[im 22/55]
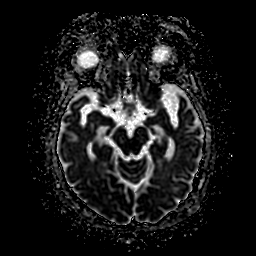
[im 33/55]
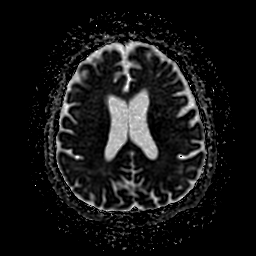
[im 44/55]
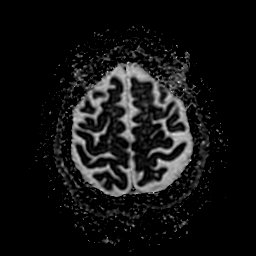
[im 55/55]
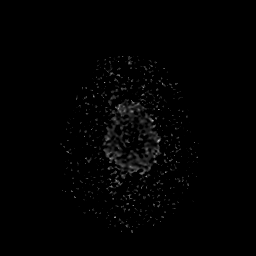

[Series 450: ADC · coronal · 4.0mm · 0.94mm/px · 4 of 37 slices shown (2 of 2)]
[im 1/37]
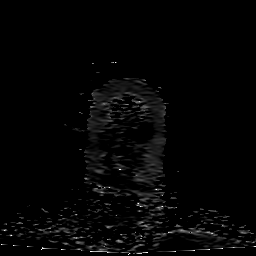
[im 13/37]
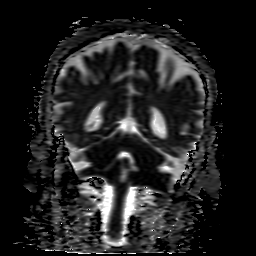
[im 25/37]
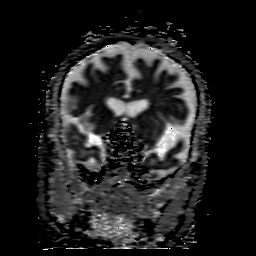
[im 37/37]
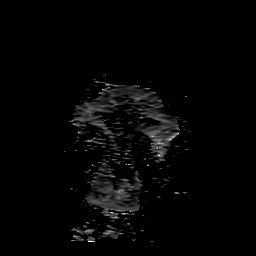

[48 of 48 positions shown; findings below may reference images not displayed]

FINDINGS: Study is mildly degraded by motion artifact despite repeated imaging
attempts.

Brain: No restricted diffusion to suggest acute infarction. No
midline shift, mass effect, evidence of mass lesion,
ventriculomegaly, extra-axial collection or acute intracranial
hemorrhage. Cervicomedullary junction and pituitary are within
normal limits.

Mild for age scattered cerebral white matter T2 and FLAIR
hyperintensity appears stable since 9692. No cortical
encephalomalacia or chronic cerebral blood products identified. Deep
gray matter nuclei, brainstem and cerebellum appear within normal
limits.

Vascular: Major intracranial vascular flow voids are stable since
9692.

Skull and upper cervical spine: Negative visible cervical spine.
Normal bone marrow signal.

Sinuses/Orbits: Stable and negative.

Other: Mastoids remain clear. Stable and grossly negative internal
auditory structures. Scalp and face soft tissues appear negative.
IMPRESSION: No acute intracranial abnormality.

Stable since 9692 and largely normal for age noncontrast MRI
appearance of the brain.

## 2020-01-10 IMAGING — DX DG CHEST 1V PORT
1 series · 1 of 1 positions shown · non-contrast
Comparison: Portable exam 0002 hours compared 12/13/2015

CLINICAL DATA: Altered mental status, dyspnea today, history
hypertension, asthma, coronary artery disease

EXAM:
PORTABLE CHEST 1 VIEW

[chest ap]
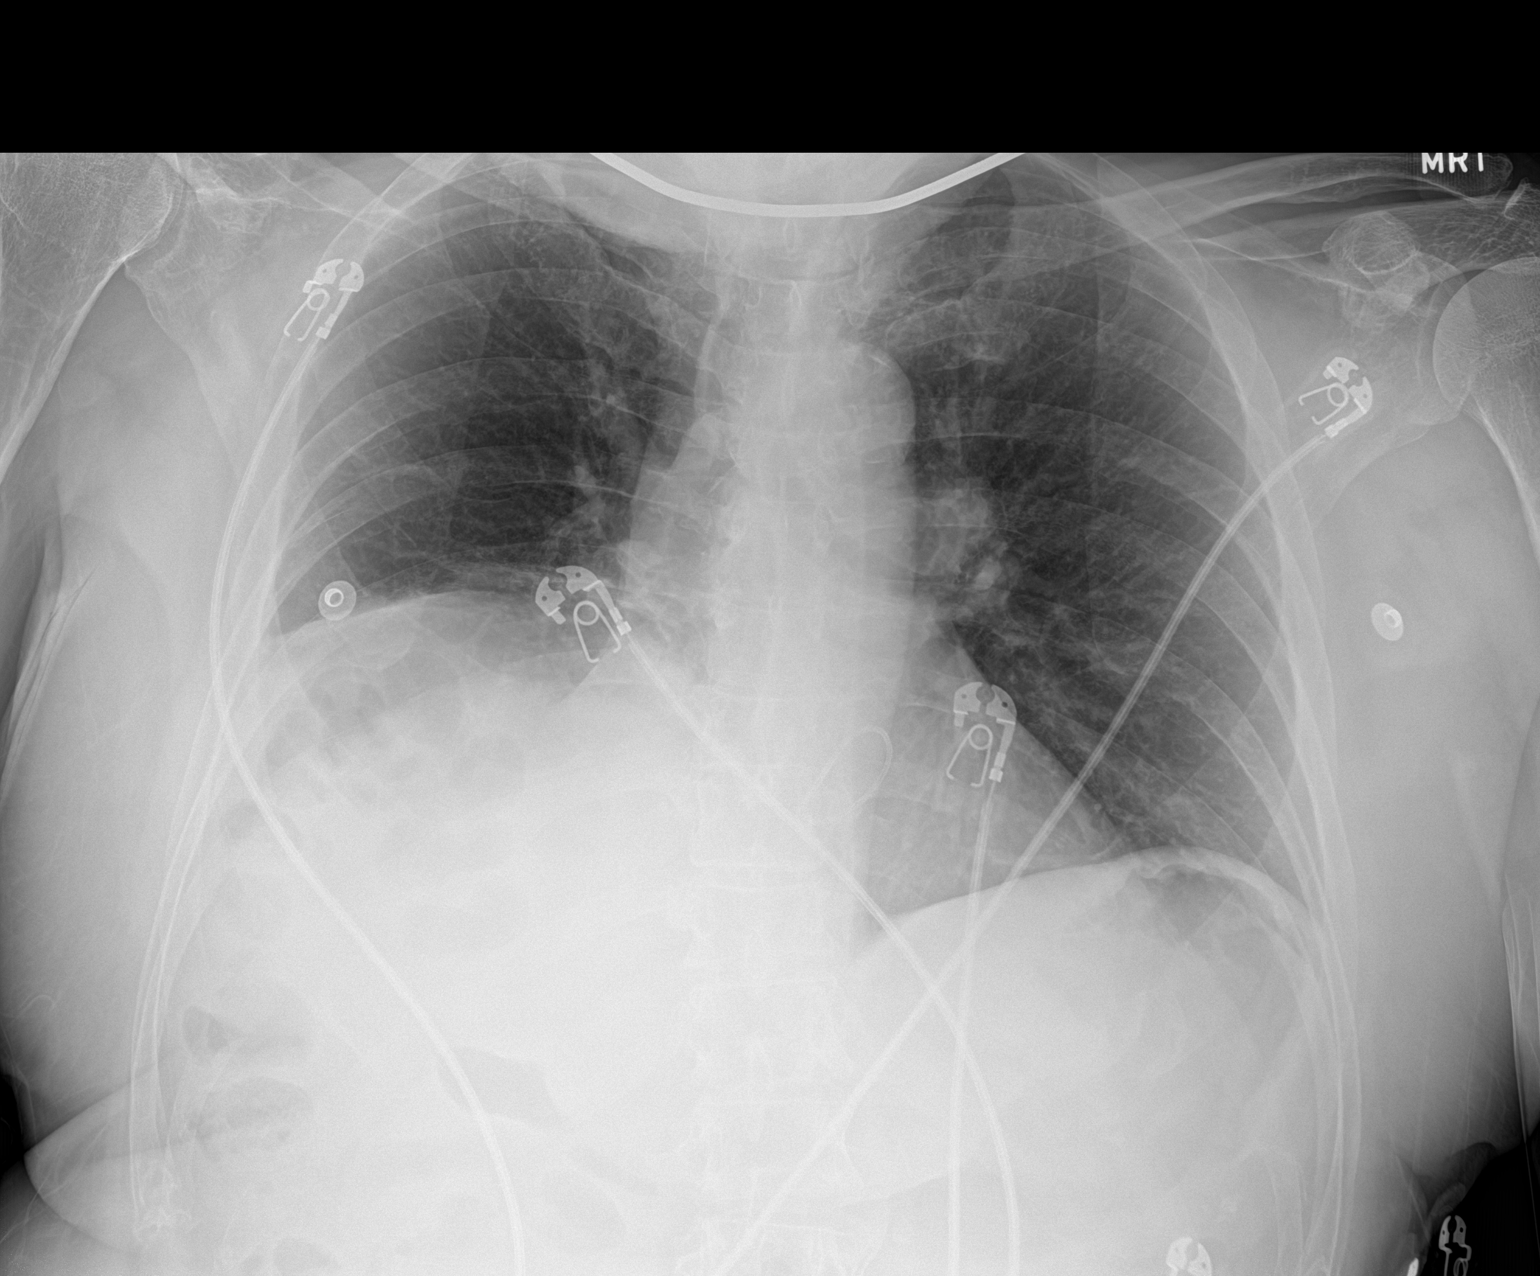

[1 of 1 positions shown; findings below may reference images not displayed]

FINDINGS: Borderline enlargement of cardiac silhouette post MVR.

Mediastinal contours and pulmonary vascularity normal.

Atherosclerotic calcification aorta.

Chronic elevation of RIGHT diaphragm with colon interposition.

Mild RIGHT basilar atelectasis.

Remaining lungs clear.

No acute infiltrate, pleural effusion or pneumothorax.

Bones demineralized.
IMPRESSION: Chronic elevation of RIGHT diaphragm with mild RIGHT basilar
atelectasis.

## 2020-01-24 ENCOUNTER — Telehealth: Payer: Self-pay | Admitting: Nurse Practitioner

## 2020-01-24 NOTE — Telephone Encounter (Signed)
Spoke with daughter, [name], and have rescheduled the Palliative f/u visit to 1019/21 @ 12 Noon.  Patient will be at [place] [place] on 01/25/20.  Notified NP.

## 2020-02-01 ENCOUNTER — Other Ambulatory Visit: Payer: Self-pay

## 2020-02-01 ENCOUNTER — Other Ambulatory Visit: Payer: Medicare Other | Admitting: Nurse Practitioner

## 2020-02-01 ENCOUNTER — Encounter: Payer: Self-pay | Admitting: Nurse Practitioner

## 2020-02-01 DIAGNOSIS — F0391 Unspecified dementia with behavioral disturbance: Secondary | ICD-10-CM

## 2020-02-01 DIAGNOSIS — Z515 Encounter for palliative care: Secondary | ICD-10-CM

## 2020-02-01 NOTE — Progress Notes (Signed)
[facility] Consult Note Telephone: [phone]  Fax: [phone]  PATIENT NAME: [name] [address] [place] [address] [phone] (home) [phone] (work) DOB: 10-Dec-1940 MRN: [id number]  PRIMARY CARE PROVIDER:    [name], MD,  [place] [address] [phone]  REFERRING PROVIDER:   [name], MD [address] [place],  [place] [address] [phone]  RESPONSIBLE PARTY:   Extended Emergency Contact Information Primary Emergency Contact: Gugliotta,[name] Address: [address]          [place], [place] [address] [name] of [place] Phone: [phone] Mobile Phone: [phone] Relation: Spouse Secondary Emergency Contact: [place] of [place] Phone: [phone] Relation: Daughter  I met face to face with patient and family in home.  ASSESSMENT AND RECOMMENDATIONS:   1. Advance Care Planning/Goals of Care:   Goal of care: Per family, patient's goal of care is comfort and to keep patient at home as long as possible.  Directives: Patient has a living will. Patient code status is Do Not resuscitate. Signed DNR form in home, copy on [facility] EMR.  2. Symptom Management: Patient with with advanced dementia FAST 7b down from 6d at last visit last month, diagnosed in 2017. Patient not cooperative with care, has preference for male caregivers. Patient currently attends adult day program at [facility] on Mondays, Wednesdays, Thursdays, and fridays from 9:30am to 5pm. Family changed plans to place patient in the memory unit of an assisted living facility ([facility]) due to facility visit restrictions related to covid -19 pandemic. Family chose to care for patient at home at this time. Family expressed concerns for ongoing care for patient as they are expecting a new born grandchild in a couple of weeks from patient's  daughter [name]. Family asked for recommendation for home care giver that would be flexible enough to assist in caring for both patient and the new born. Will contact social worker [place] for recommendations. Family report patient having audible wheeze in the last week, report patient not tolerating Nebulizer as he pulls it off his face when in use, patient unable to use inhaler. Wife also report increased snoring at night. No report of fever, chills, cough, dyspnea, or SOB, no report of chest pain, report good appetite. No adventitious lung sound noted on physical exam today, no sign of acute distress or illness noted. Will reevaluate patient next week, family to call if acute decline or change in condition.  Family report occasional episodes of insomnia, report patient waking up at night several times. Sometimes just walks within the house. Family report patient was on Depakote but was stopped per family request due to day-time sedation. Recommend administering Melatonin 7m by mouth as needed for insomnia. Patient received the booster shot of covid-19 vaccine last week. Patient wife report receiving a session of counseling with a Counsellor from [employer] she however report that there is no follow-up session scheduled. Daughter RDemetrius Charitysaid she will reach out to the sEducation officer, museumfor guidance. Family verbalized interest in clinical trials, family advised to reach out to his Neurologist to discuss opportunities. Palliative care will continue to provide support to patient, family and the medical team.   3. Follow up Palliative Care Visit: Palliative care will continue to follow for goals of care clarification and symptom management. Return one weeks or prn.  4. Family /Caregiver/Community Supports:  Patient lives at home with wife of 51years, has  2 daughters. Daughter [name] and her husband moved in with patient and wife to help care for him. Patient originally from [place], has lived in [place] for 47  years. Family very involved in care. Has supportive [place] community.  5. Cognitive / Functional decline: Patient awake and alert, very confused with limited speech, lost ability to speak or understand [place] Language, only understands Hindi. Patient currently dependent on family for all of his ADls, he sometimes able to feed self. Family report episodes of bowel incontinence in the last 2 weeks. Patient able to recognize his family members. Patient walks independently without assistive device. No report of recent falls or skin issues. 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   I spent 60 minutes providing this consultation,  from 12 noon to 1pm. More than 50% of the time in this consultation was spent coordinating communication.   HISTORY OF PRESENT ILLNESS:  [name] is a 79 y.o. year old male with multiple medical problems including Dementia (FAST 7b), Asthma, CAD. Palliative Care was asked to follow this patient by consultation request of [name], MD to help address advance care planning and goals of care.  This is a follow up visit.  CODE STATUS: DNR  PPS: 50%  HOSPICE ELIGIBILITY/DIAGNOSIS: TBD  PAST MEDICAL HISTORY:  Past Medical History:  Diagnosis Date  . Asthma   . CAD (coronary artery disease)   . Cancer [facility]) 2016   prostate  . Complication of anesthesia    CO2 high according to patient   . Family history of ovarian cancer   . GERD (gastroesophageal reflux disease)   . History of melanoma   . History of prostate cancer   . HTN (hypertension)   . Mitral valve prolapse   . Osteoporosis   . TIA (transient ischemic attack)   . UTI (lower urinary tract infection)     SOCIAL HX:  Social History   Tobacco Use  . Smoking status: Never Smoker  . Smokeless tobacco: Never Used  Substance Use Topics  . Alcohol use: Yes    Comment: SOCIAL   FAMILY HX:  Family History    Problem Relation Age of Onset  . Heart disease Father   . Heart disease Mother   . Cirrhosis Brother   . Ulcers Brother   . Ovarian cancer Other        d. 74    ALLERGIES:  Allergies  Allergen Reactions  . Penicillins Itching and Rash    Has patient had a PCN reaction causing immediate rash, facial/tongue/throat swelling, SOB or lightheadedness with hypotension: YES Has patient had a PCN reaction causing severe rash involving mucus membranes or skin necrosis: NO Has patient had a PCN reaction that required hospitalization: NO Has patient had a PCN reaction occurring within the last 10 years: NO If all of the above answers are "NO", then may proceed with Cephalosporin use.     PERTINENT MEDICATIONS:  Outpatient Encounter Medications as of 02/01/2020  Medication Sig  . albuterol (PROVENTIL) (2.5 MG/3ML) 0.083% nebulizer solution Take 3 mLs (2.5 mg total) by nebulization every 6 (six) hours as needed for wheezing or shortness of breath.  Marland Kitchen aspirin 81 MG tablet Take 81 mg by mouth daily.    Marland Kitchen atorvastatin (LIPITOR) 10 MG tablet TAKE 1 TABLET BY MOUTH  DAILY  . budesonide (PULMICORT) 0.5 MG/2ML nebulizer solution Take 2 mLs (0.5 mg total)  by nebulization daily.  . Cholecalciferol 50 MCG (2000 UT) CAPS Take by mouth.  . divalproex (DEPAKOTE SPRINKLES) 125 MG capsule Take 1 capsule (125 mg total) by mouth 2 (two) times daily. Take one (1)  tablet at night for a week and then increase to two (2) tablets a night  . escitalopram (LEXAPRO) 10 MG tablet Take 1 tablet twice a day  . losartan (COZAAR) 50 MG tablet Take 25 mg by mouth daily.  . rivastigmine (EXELON) 4.6 mg/24hr Place 1 patch (4.6 mg total) onto the skin daily.  . vitamin C (ASCORBIC ACID) 500 MG tablet Take 500 mg by mouth daily.   No facility-administered encounter medications on file as of 02/01/2020.    PHYSICAL EXAM / ROS:   Current and past weights: 168lbs up from 160lbs at last visit last month.  General: well  appearing,uncoorportive, sits and paced in the kitchen intermittently  Cardiovascular: no extremity edema  Pulmonary: no cough, no SOB, room air Abdomen: no report of constipation, incontinent of bowel GU: no report of dysuria, incontinent of urine MSK:  no joint and ROM abnormalities, ambulatory Skin: no rashes or wounds reported Neurological:  nonfocal  [name], DNP, AGPCNP-BC

## 2020-02-02 ENCOUNTER — Encounter: Payer: Self-pay | Admitting: Neurology

## 2020-02-02 ENCOUNTER — Other Ambulatory Visit: Payer: Self-pay

## 2020-02-02 ENCOUNTER — Telehealth (INDEPENDENT_AMBULATORY_CARE_PROVIDER_SITE_OTHER): Payer: Medicare Other | Admitting: Neurology

## 2020-02-02 VITALS — Ht 67.0 in | Wt 160.0 lb

## 2020-02-02 DIAGNOSIS — F0391 Unspecified dementia with behavioral disturbance: Secondary | ICD-10-CM | POA: Diagnosis not present

## 2020-02-02 DIAGNOSIS — F03B18 Unspecified dementia, moderate, with other behavioral disturbance: Secondary | ICD-10-CM

## 2020-02-02 MED ORDER — ESCITALOPRAM OXALATE 20 MG PO TABS
20.0000 mg | ORAL_TABLET | Freq: Every day | ORAL | 3 refills | Status: DC
Start: 2020-02-02 — End: 2020-06-07

## 2020-02-02 MED ORDER — RIVASTIGMINE 4.6 MG/24HR TD PT24: 4.6000 mg | MEDICATED_PATCH | Freq: Every day | TRANSDERMAL | 11 refills | Status: DC

## 2020-02-02 NOTE — Progress Notes (Signed)
Virtual Visit via Video Note The purpose of this virtual visit is to provide medical care while limiting exposure to the novel coronavirus.    Consent was obtained for video visit:  Yes.   Answered questions that patient had about telehealth interaction:  Yes.   I discussed the limitations, risks, security and privacy concerns of performing an evaluation and management service by telemedicine. I also discussed with the patient that there may be a patient responsible charge related to this service. The patient expressed understanding and agreed to proceed.  Pt location: Home Physician Location: office Name of referring provider:  [name], MD I connected with [name] at patients initiation/request on 02/02/2020 at 11:30 AM EDT by video enabled telemedicine application and verified that I am speaking with the correct person using two identifiers. Pt MRN:  [id number] Pt DOB:  [birth year] Video Participants:  [name];  [name] (daughter)   History of Present Illness:  The patient was seen as a virtual video visit on 02/02/2020. He was last seen 7 months ago for dementia with behavioral disturbance. His daughter [name] is present to provide information. Since his last visit, he has had continued decline. Family called in April about more sleep and behavioral changes, he was refusing to sleep, using bad words and being defiant. He was started on Depakote however this made him too drowsy and zombie-like, not wanting to get out of bed. In June, he only wanted to eat and sleep, was becoming more aggressive and refusing to leave the house or bathe. Bloodwork and urinalysis negative.Family discontinued Depakote, he is taking Lexapro 10mg  daily without side effects. He has the rivastigmine patch 4.6mg /day. They have been administering CBD twice a day. Since then, he has been overall doing good. Speech output is overall a lot less. He goes back to speaking Hindi or [place]. He is  able to feed himself and drink from a cup. Family administers medications, manages finances and meals. They have to do 2 loads of laundry daily due to soiled clothes and sheets. He goes to the bathroom to use it but comes out without pants. They have to clean after him. He gets up to use the bathroom at night and goes back to bed. They have to grind all his medications, he has too much anxiety when he sees his medications in the pillbox. He goes to [facility] day program 4 days a week. He is noted to have some anxiety at [facility], standing over patients trying to snatch their food. He tried to walk out one time but was able to be asked to return with the offer of food. Many months ago he tried to open the door at their home, but has not done it since then.    History on Initial Assessment 11/23/2018: This is a 79 year old left-handed man with a history of hypertension, prostate cancer, presenting to establish care for dementia. Family reports memory changes started 3-4 years ago where he would not remember names. Later on he did not recognize friends. His wife reports taking over finances in 2019, it appears he still did the taxes in 2019 (despite being diagnosed with dementia in 2018). He has been evaluated by neurologist Dr. Brett Fairy in 2018.  [place] 23/30 in March 2018, 17/30 in April 2019. He stopped driving a year ago when he would get lost or go past their house. He started going to the [facility] clinic in October 2019. He refused to do [facility]  testing but was noted to have cognitive deficits in multiple cognitive domains, with affective and behavioral components of delusions, visual hallucinations, irritability. No gait or sleep component. He had side effects on Donepezil and would miss doses of Rivastigmine. He was switched to the transdermal patch 4.6mg  daily. He had a paraneoplastic panel done which was negative. His last visit at [facility] was 2 months ago, he has seen Psychiatry and done well with  addition of Lexapro for anxiety. It was noted that dose of rivastigmine was not increased due to syncopal episode. Diagnosis of late onset dementia,probable Alzheimer's disease, cognitive deficits are moderate to severe.   Family initially felt that the rivastigmine patch was "overacting," so they tried administering the patch every other day. They noticed worsening and put him back on daily patches. He remembers things better and helps around the house. He can be stubborn and very demanding, wanting to get dressed and shaved early in the morning. Wife reports he is very concerned and worried, he keeps repeating shaving because it is not as good as he wants. He For the past few months, he has been rummaging a lot in the house. He does not realized ownership of things and would pick up things that are not his and start using it. He gets confused using his electric shaver. They report sleep and appetite are good. Family reports he craves sweets. Family denies any hallucinations since starting the patches. He does not read as much. They deny any paranoia, most of the time he is calm, once in a while he gets a burst and gets mad or upset. He was more emotional when they visited [place] 1.5 years ago, family has not noticed this recently but do wonder about depression.   He denies any headaches, dizziness, vision changes, dysarthria/dysphagia, neck/back pain, focal numbness/tingling/weakness, bowel/bladder dysfunction, anosmia, or tremors. He denies any falls then his wife reminds him she fell and he states he was carrying a lot of things then. He states he "remembers everything."   He had an MRI brain in 03/2018 which I personally reviewed showing moderate diffuse atrophy and mild chronic microvascular disease.    Current Outpatient Medications on File Prior to Visit  Medication Sig Dispense Refill  . aspirin 81 MG tablet Take 81 mg by mouth daily.      Marland Kitchen atorvastatin (LIPITOR) 10 MG tablet TAKE 1 TABLET BY  MOUTH  DAILY 90 tablet 3  . Cholecalciferol 50 MCG (2000 UT) CAPS Take by mouth.    . escitalopram (LEXAPRO) 10 MG tablet Take 1 tablet twice a day 180 tablet 3  . losartan (COZAAR) 50 MG tablet Take 25 mg by mouth daily.    . rivastigmine (EXELON) 4.6 mg/24hr Place 1 patch (4.6 mg total) onto the skin daily. 90 patch 3  . vitamin C (ASCORBIC ACID) 500 MG tablet Take 500 mg by mouth daily.    Marland Kitchen albuterol (PROVENTIL) (2.5 MG/3ML) 0.083% nebulizer solution Take 3 mLs (2.5 mg total) by nebulization every 6 (six) hours as needed for wheezing or shortness of breath. (Patient not taking: Reported on 02/02/2020) 75 mL 12  . budesonide (PULMICORT) 0.5 MG/2ML nebulizer solution Take 2 mLs (0.5 mg total) by nebulization daily. (Patient not taking: Reported on 02/02/2020) 60 mL 12  . divalproex (DEPAKOTE SPRINKLES) 125 MG capsule Take 1 capsule (125 mg total) by mouth 2 (two) times daily. Take one (1)  tablet at night for a week and then increase to two (2) tablets a night (Patient  not taking: Reported on 02/02/2020) 180 capsule 0   No current facility-administered medications on file prior to visit.     Observations/Objective:   Vitals:   02/02/20 1110  Weight: 160 lb (72.6 kg)  Height: 5\' 7"  (1.702 m)   GEN:  The patient appears stated age and is in NAD.  Neurological examination: Patient is awake, alert, waves and can mirror movements but cannot follow verbal commands. He can follow better when translated in [place]. Minimal verbal output. Cranial nerves: Extraocular movements intact with no nystagmus. No facial asymmetry. Motor: moves all extremities symmetrically, at least anti-gravity x 4. No incoordination on finger to nose testing. Gait: narrow-based and steady, no ataxia   Assessment and Plan:   This is a 79 yo RH man with a history of hypertension, prostate cancer, with moderate to severe dementia, likely due to Alzheimer's disease. MMSE 6/30 in August 2020, unable to do MMSE today, he is  having more difficulties following commands with minimal verbal output. Continue Rivastigmine patch 4.6mg /day. Family reporting more anxiety, increase Lexapro to 20mg  daily. They will also try giving melatonin in the evenings. Continue 24/7 supervision, family would like to keep him home as long as able. Caregiver support provided. His daughter will be having a baby soon, we will do a follow-up in 6 weeks, they know to call for any changes.    Follow Up Instructions:   -I discussed the assessment and treatment plan with the patient. The patient was provided an opportunity to ask questions and all were answered. The patient agreed with the plan and demonstrated an understanding of the instructions.   The patient was advised to call back or seek an in-person evaluation if the symptoms worsen or if the condition fails to improve as anticipated.   Cameron Sprang, MD

## 2020-02-03 ENCOUNTER — Telehealth: Payer: Self-pay | Admitting: Neurology

## 2020-02-03 NOTE — Telephone Encounter (Signed)
Patient's daughter [name] called in and left a message. She would like the Buspar prescription called in. She feels the time difference or weather is messing with him. He will be starting the extra Lexapro today.

## 2020-02-04 MED ORDER — BUSPIRONE HCL 5 MG PO TABS: ORAL_TABLET | ORAL | 5 refills | Status: DC

## 2020-02-04 NOTE — Telephone Encounter (Signed)
Pt daughter informed that melatonin is fine for him to take

## 2020-02-04 NOTE — Telephone Encounter (Signed)
That is fine, thanks 

## 2020-02-04 NOTE — Telephone Encounter (Signed)
Pls let Reena know I sent in Buspar 5mg  twice a day as needed to [facility]. She may give it on a regular basis instead of prn if necessary. Thanks

## 2020-02-04 NOTE — Telephone Encounter (Signed)
Reena is asking about is it ok for her dad to take melatonin,

## 2020-02-08 ENCOUNTER — Other Ambulatory Visit: Payer: Self-pay

## 2020-02-08 ENCOUNTER — Other Ambulatory Visit: Payer: Medicare Other | Admitting: Nurse Practitioner

## 2020-02-08 DIAGNOSIS — Z515 Encounter for palliative care: Secondary | ICD-10-CM

## 2020-02-08 NOTE — Progress Notes (Addendum)
[facility] Consult Note Telephone: [phone]  Fax: [phone]  PATIENT NAME: [name] [address] [place] [address] [phone] (home) [phone] (work) DOB: [birth year] MRN: [id number]  PRIMARY CARE PROVIDER:    [name], MD,  [place] [address] [phone]  REFERRING PROVIDER:   [name], [place],  [place] [address] [phone]  RESPONSIBLE PARTY:   Extended Emergency Contact Information Primary Emergency Contact: [name],[name] Address: [address]          [place], [place] [address] [name] Litter of [place] Phone: [phone] Mobile Phone: [phone] Relation: Spouse Secondary Emergency Contact: Martin of [place] Phone: [phone] Relation: Daughter   ASSESSMENT AND RECOMMENDATIONS:   1. Advance Care Planning/Goals of Care: Today's visit is aimed at completing the MOST form and assessing patient for family's report of patient respiratory symptoms if wheezing. Family report patient is not home today, he was reported to be at the [facility] day program due to missing a day last week due to behavior issues. Goal of care: Per family, patient's goal of care is comfort and to remain at home as long as possible. Directives:Patient has a living will.Patient code status is Do Not resuscitate. Signed DNR form in home. Blank MOST form was left with family to review and discuss at the initial visit about a month ago. Discussed the MOST and reviewed sections of the form in detail with patient's wife and daughter [name] today. Opportunity for questions was given. MOST form completed and signed, copy given for family to keep, copy uploaded to [facility] EMR. Discussed disease trajectory of dementia with family, education material on dementia disease trajectory given to family. Patient noted to be in the  moderate to late stage of the disease. Family expressed appreciation for education provided.  Family requested for suggestions of information of providers that can provide Primary care services for patient in home as patient becomes agitated when in a clinic. Gave family name and info for Dr. Daphene Jaeger and Alvester Chou, NP. Palliative care will continue to provide support to patient, family and the medical team.    3. Follow up Palliative Care Visit: Palliative care will continue to follow for goals of care clarification and symptom management. Return in about 4 weeks or prn.  4. Family /Caregiver/Community Supports: Patient lives at home with wife of 73 years, has 2 daughters. Daughter [name] and her husband moved in with patient and wife to help care for him. Patient originally from [place], has lived in [place] for [place] years  I spent 30 minutes providing this consultation, more than 50% of the time in this consultation was spent coordinating communication.   HISTORY OF PRESENT ILLNESS:Reznor V Isharaniis a 79 y.o.year old malewith multiple medical problems including Dementia(FAST 7b), Asthma, CAD. Palliative Care was asked to follow this patient by consultation request ofKim, [name], MDto help address advance care planning and goals of care. This is a follow up visit from.  PAST MEDICAL HISTORY:  Past Medical History:  Diagnosis Date  . Asthma   . CAD (coronary artery disease)   . Cancer [facility]) 2016   prostate  . Complication of anesthesia    CO2 high according to patient   . Family history of ovarian cancer   . GERD (gastroesophageal reflux disease)   . History of melanoma   . History of prostate cancer   . HTN (hypertension)   .  Mitral valve prolapse   . Osteoporosis   . TIA (transient ischemic attack)   . UTI (lower urinary tract infection)     SOCIAL HX:  Social History   Tobacco Use  . Smoking status: Never Smoker  . Smokeless tobacco: Never Used  Substance Use Topics    . Alcohol use: Yes    Comment: SOCIAL   FAMILY HX:  Family History  Problem Relation Age of Onset  . Heart disease Father   . Heart disease Mother   . Cirrhosis Brother   . Ulcers Brother   . Ovarian cancer Other        d. 43    ALLERGIES:  Allergies  Allergen Reactions  . Penicillins Itching and Rash    Has patient had a PCN reaction causing immediate rash, facial/tongue/throat swelling, SOB or lightheadedness with hypotension: YES Has patient had a PCN reaction causing severe rash involving mucus membranes or skin necrosis: NO Has patient had a PCN reaction that required hospitalization: NO Has patient had a PCN reaction occurring within the last 10 years: NO If all of the above answers are "NO", then may proceed with Cephalosporin use.     PERTINENT MEDICATIONS:  Outpatient Encounter Medications as of 02/08/2020  Medication Sig  . albuterol (PROVENTIL) (2.5 MG/3ML) 0.083% nebulizer solution Take 3 mLs (2.5 mg total) by nebulization every 6 (six) hours as needed for wheezing or shortness of breath. (Patient not taking: Reported on 02/02/2020)  . aspirin 81 MG tablet Take 81 mg by mouth daily.    Marland Kitchen atorvastatin (LIPITOR) 10 MG tablet TAKE 1 TABLET BY MOUTH  DAILY  . budesonide (PULMICORT) 0.5 MG/2ML nebulizer solution Take 2 mLs (0.5 mg total) by nebulization daily. (Patient not taking: Reported on 02/02/2020)  . busPIRone (BUSPAR) 5 MG tablet Take 1 tablet two times a day as needed for anxiety  . Cholecalciferol 50 MCG (2000 UT) CAPS Take by mouth.  . divalproex (DEPAKOTE SPRINKLES) 125 MG capsule Take 1 capsule (125 mg total) by mouth 2 (two) times daily. Take one (1)  tablet at night for a week and then increase to two (2) tablets a night (Patient not taking: Reported on 02/02/2020)  . escitalopram (LEXAPRO) 20 MG tablet Take 1 tablet (20 mg total) by mouth daily.  Marland Kitchen losartan (COZAAR) 50 MG tablet Take 25 mg by mouth daily.  . rivastigmine (EXELON) 4.6 mg/24hr Place 1 patch  (4.6 mg total) onto the skin daily.  . vitamin C (ASCORBIC ACID) 500 MG tablet Take 500 mg by mouth daily.   No facility-administered encounter medications on file as of 02/08/2020.

## 2020-02-28 ENCOUNTER — Telehealth: Payer: Self-pay | Admitting: Neurology

## 2020-02-28 ENCOUNTER — Telehealth: Payer: Medicare Other | Admitting: Neurology

## 2020-02-28 NOTE — Telephone Encounter (Signed)
Patient's daughter wants Dr. Delice Lesch to know the patient is doing well with his Lexapro. They started the Melatonin gummies. They did not start the Buspar because of the possibility of seratonen syndrome. She does not need a call back, just an [place].

## 2020-03-07 ENCOUNTER — Telehealth (INDEPENDENT_AMBULATORY_CARE_PROVIDER_SITE_OTHER): Payer: Medicare Other | Admitting: Neurology

## 2020-03-07 ENCOUNTER — Other Ambulatory Visit: Payer: Self-pay

## 2020-03-07 ENCOUNTER — Encounter: Payer: Self-pay | Admitting: Neurology

## 2020-03-07 VITALS — Ht 69.0 in | Wt 163.0 lb

## 2020-03-07 DIAGNOSIS — F0391 Unspecified dementia with behavioral disturbance: Secondary | ICD-10-CM | POA: Diagnosis not present

## 2020-03-07 DIAGNOSIS — F03B18 Unspecified dementia, moderate, with other behavioral disturbance: Secondary | ICD-10-CM

## 2020-03-07 NOTE — Progress Notes (Signed)
Virtual Visit via Video Note The purpose of this virtual visit is to provide medical care while limiting exposure to the novel coronavirus.    Consent was obtained for video visit:  Yes.   Answered questions that patient had about telehealth interaction:  Yes.   I discussed the limitations, risks, security and privacy concerns of performing an evaluation and management service by telemedicine. I also discussed with the patient that there may be a patient responsible charge related to this service. The patient expressed understanding and agreed to proceed.  Pt location: Home Physician Location: office Name of referring provider:  [name], MD I connected with [name] at patients initiation/request on 03/07/2020 at 12:00 PM EST by video enabled telemedicine application and verified that I am speaking with the correct person using two identifiers. Pt MRN:  [id number] Pt DOB:  20-Mar-1941 Video Participants:  [name];  [name] (daughter)   History of Present Illness:  The patient was seen as a virtual video visit on 03/07/2020. He was last seen a month ago for moderate to severe dementia with behavioral disturbance. His daughter [name] is present to provide information. Since his last visit, Lexapro dose was increased to 20mg  daily and he has started melatonin gummies 3mg  in the evening. He is doing better on this regimen, it is helping a lot. He is sleeping better, only waking up once at night. No REM behavior disorder, he talks in his sleep, sometimes speaking very clear words or sentences in either [place] or [place]. He is also doing better at [facility], he still has quite a bit of anxiety but staff can handle it more. They have not started Buspar yet. He laughs a lot, appetite is very good. His wife has noticed he is having more difficulty lifting his arms above his head when they try to put his shirt on. He has had mild hand tremors for a little while now. Reena  will be having a baby soon, they are planning to do respite care at [place]. He is on Rivastigmine patch 4.6mg /day, no side effects.   History on Initial Assessment 11/23/2018: This is a 79 year old left-handed man with a history of hypertension, prostate cancer, presenting to establish care for dementia. Family reports memory changes started 3-4 years ago where he would not remember names. Later on he did not recognize friends. His wife reports taking over finances in 2019, it appears he still did the taxes in 2019 (despite being diagnosed with dementia in 2018). He has been evaluated by neurologist Dr. Brett Fairy in 2018.  [place] 23/30 in March 2018, 17/30 in April 2019. He stopped driving a year ago when he would get lost or go past their house. He started going to [facility] in October 2019. He refused to do [facility] testing but was noted to have cognitive deficits in multiple cognitive domains, with affective and behavioral components of delusions, visual hallucinations, irritability. No gait or sleep component. He had side effects on Donepezil and would miss doses of Rivastigmine. He was switched to the transdermal patch 4.6mg  daily. He had a paraneoplastic panel done which was negative. His last visit at [facility] was 2 months ago, he has seen Psychiatry and done well with addition of Lexapro for anxiety. It was noted that dose of rivastigmine was not increased due to syncopal episode. Diagnosis of late onset dementia,probable Alzheimer's disease, cognitive deficits are moderate to severe.   Family initially felt that the rivastigmine  patch was "overacting," so they tried administering the patch every other day. They noticed worsening and put him back on daily patches. He remembers things better and helps around the house. He can be stubborn and very demanding, wanting to get dressed and shaved early in the morning. Wife reports he is very concerned and worried, he keeps repeating shaving  because it is not as good as he wants. He For the past few months, he has been rummaging a lot in the house. He does not realized ownership of things and would pick up things that are not his and start using it. He gets confused using his electric shaver. They report sleep and appetite are good. Family reports he craves sweets. Family denies any hallucinations since starting the patches. He does not read as much. They deny any paranoia, most of the time he is calm, once in a while he gets a burst and gets mad or upset. He was more emotional when they visited [place] 1.5 years ago, family has not noticed this recently but do wonder about depression.   He denies any headaches, dizziness, vision changes, dysarthria/dysphagia, neck/back pain, focal numbness/tingling/weakness, bowel/bladder dysfunction, anosmia, or tremors. He denies any falls then his wife reminds him she fell and he states he was carrying a lot of things then. He states he "remembers everything."   He had an MRI brain in 03/2018 which I personally reviewed showing moderate diffuse atrophy and mild chronic microvascular disease.   Current Outpatient Medications on File Prior to Visit  Medication Sig Dispense Refill  . aspirin 81 MG tablet Take 81 mg by mouth daily.      Marland Kitchen atorvastatin (LIPITOR) 10 MG tablet TAKE 1 TABLET BY MOUTH  DAILY 90 tablet 3  . Cholecalciferol 50 MCG (2000 UT) CAPS Take by mouth.    . escitalopram (LEXAPRO) 20 MG tablet Take 1 tablet (20 mg total) by mouth daily. 90 tablet 3  . Lidocaine-Menthol (CBD4 FREEZE PUMP MAXIMUM STR EX) Apply topically.    Marland Kitchen losartan (COZAAR) 50 MG tablet Take 25 mg by mouth daily.    . Melatonin-Pyridoxine (MELATIN PO) Take 3 mg by mouth.    . rivastigmine (EXELON) 4.6 mg/24hr Place 1 patch (4.6 mg total) onto the skin daily. 30 patch 11  . vitamin C (ASCORBIC ACID) 500 MG tablet Take 500 mg by mouth daily.    Marland Kitchen albuterol (PROVENTIL) (2.5 MG/3ML) 0.083% nebulizer solution Take 3 mLs (2.5  mg total) by nebulization every 6 (six) hours as needed for wheezing or shortness of breath. (Patient not taking: Reported on 03/07/2020) 75 mL 12  . budesonide (PULMICORT) 0.5 MG/2ML nebulizer solution Take 2 mLs (0.5 mg total) by nebulization daily. (Patient not taking: Reported on 02/02/2020) 60 mL 12  . busPIRone (BUSPAR) 5 MG tablet Take 1 tablet two times a day as needed for anxiety (Patient not taking: Reported on 03/07/2020) 60 tablet 5   No current facility-administered medications on file prior to visit.     Observations/Objective:   Vitals:   03/07/20 1047  Weight: 163 lb (73.9 kg)  Height: 5\' 9"  (1.753 m)   GEN:  The patient appears stated age and is in NAD.  Neurological examination: Patient is awake, alert, minimal verbal output, he does not follow commands. Cranial nerves: Extraocular movements intact when looking at his daughter and wife. No facial asymmetry. Motor: moves all extremities symmetrically, at least anti-gravity x 4.    Assessment and Plan:   This is a 79  yo RH man with a history of hypertension, prostate cancer, with moderate to severe dementia, likely due to Alzheimer's disease. MMSE 6/30 in August 2020, unable to do MMSE, he is unable to follow commands with minimal verbal output. He has had good response to Lexapro 20mg  daily and melatonin 3mg  in the evenings, however still has bouts of anxiety. We discussed a trial of Buspar, side effects including serotonin syndrome discussed. Discussed avoidance of benzodiazepines in dementia. Family closely monitors symptoms. Agree with respite care at [facility]. Continue 24/7 care. Follow-up in 3 months, they know to call for any changes.    Follow Up Instructions:   -I discussed the assessment and treatment plan with the patient's family. The patient's family were provided an opportunity to ask questions and all were answered. The patient's family agreed with the plan and demonstrated an understanding of the  instructions.   The patient's family was advised to call back or seek an in-person evaluation if the symptoms worsen or if the condition fails to improve as anticipated.    Cameron Sprang, MD

## 2020-03-16 ENCOUNTER — Ambulatory Visit: Payer: Medicare Other | Admitting: Cardiology

## 2020-03-23 ENCOUNTER — Ambulatory Visit: Payer: Medicare Other | Admitting: Cardiology

## 2020-03-31 ENCOUNTER — Ambulatory Visit: Payer: Medicare Other | Admitting: Cardiology

## 2020-04-07 ENCOUNTER — Other Ambulatory Visit: Payer: Medicare Other | Admitting: Nurse Practitioner

## 2020-04-07 ENCOUNTER — Other Ambulatory Visit: Payer: Self-pay

## 2020-04-07 DIAGNOSIS — Z515 Encounter for palliative care: Secondary | ICD-10-CM

## 2020-04-07 DIAGNOSIS — F0281 Dementia in other diseases classified elsewhere with behavioral disturbance: Secondary | ICD-10-CM

## 2020-04-07 DIAGNOSIS — G3109 Other frontotemporal dementia: Secondary | ICD-10-CM

## 2020-04-07 NOTE — Progress Notes (Signed)
[facility] Consult Note Telephone: [phone]  Fax: [phone]  PATIENT NAME: [name] [address] [place] [address] [phone] (home) [phone] (work) DOB: 1940/07/19 MRN: [id number]  PRIMARY CARE PROVIDER:    [name], MD,  [address] [address] [phone]  REFERRING PROVIDER:   [name], MD [place],  [place] [address] [phone]  RESPONSIBLE PARTY:   Extended Emergency Contact Information Primary Emergency Contact: Mcbeth,[name] Address: [address]          [place],  [address] [name] of [place] Phone: [phone] Mobile Phone: [phone] Relation: Spouse Secondary Emergency Contact: Bethany of [place] Phone: [phone] Relation: Daughter  I met face to face with patient in home.  ASSESSMENT AND RECOMMENDATIONS:   Advance Care Planning:  Goal of care: Patient's goal of care is comfort and to remain at homefor as long as possible. Directives: Patient has a living will. Signed DNR and MOST form in home and on [facility] EMR. Details of MOST form include: DNR/no CPR, Full scope of treatment. Antibiotics if indicated, IVF for a defined trial period, Feeding tube for a defined trial period.   Cognitive / Functional decline / Symptom Management: Patient awake and alert, very confused with limited speech, mostly speaks his native language. Patient currently dependent on family for all of his ADls, he is able to feed self after set up, walks independently without assistive device. No report of recent falls or skin issues. Patient able to recognize his family members.  Patient is followed by Neurologist Dr. Ellouise Newer, last visit was on 02/02/2020, no change medication. Recommended starting patient on Buspar if worsening anxiety, she however expressed concern for possible serotonin syndrome  given that patient is on Lexapro, family report patient not taking Buspar. Patient current regimen include Lexapro 4m daily, Rivastigmine 4.634mday patch . Family have also been administering CBD compound by mouth twice a day. Family report improvement in behavior with current regimen. Report good appetite, report patient sleeps through the night getting up only to use the bathroom, report regular bowel movements. Patient continues to attend day program at [facility] times a week, plan is for him to attend full day 5 days a week from next year. Family report a near choking episode on 03/28/20, with patient becoming hypoxic, EMS was called, and situation was resolved. Muscle rigidity: Family report patient with muscle stiffness, report patient experiencing difficulty reaching above his head. Report this posses a challenge with grooming and dressing. No report of pain. Recommendation: recommend physical therapy. Patient would benefit from Physical therapy for muscle strengthening.  Follow up Palliative Care Visit: Palliative care will continue to follow for goals of care clarification and symptom management. Return in about 4 weeks or prn.  Family /Caregiver/Community Supports: [place] wife of 5[place]. His daughter ReMearl Latinnd her husband lives with them to assist in caring for patient. ReMearl Latinad a baby 2 weeks ago. Repot patient very protective of the baby.  I spent 60 minutes providing this consultation, time includes time spent with patient and family, chart review, provider coordination, and documentation. More than 50% of the time in this consultation was spent counseling and coordinating communication.   CHIEF COMPLAINT: Muscle Stiffness  HISTORY OF PRESENT ILLNESS:Louis V Isharaniis a [age].o.year old malewith multiple medical problems including Dementia(FAST7b), Asthma, CAD, history of prostate  cancer. Palliative Care was asked to help  address advance care planning, goals of care and symptoms management. This is afollow up visit from 02/08/2020.  CODE STATUS: DNR  PPS: 50%  HOSPICE ELIGIBILITY/DIAGNOSIS: TBD  PHYSICAL EXAM / ROS:   General: well appearing,uncoorportive, lying in bed in NAD Cardiovascular: no report of chest pain, no edema  Pulmonary: no cough, no SOB, room air Abdomen: no report of constipation, incontinent of bowel GU: no report of dysuria, incontinent of urine MSK:  no joint and ROM abnormalities, ambulatory Skin: no rashes or wounds reported or noted on exposed skin Neurological:  confused, otherwise nonfocal Psych: non -anxious affect  PAST MEDICAL HISTORY:  Past Medical History:  Diagnosis Date  . Asthma   . CAD (coronary artery disease)   . Cancer [facility]) 2016   prostate  . Complication of anesthesia    CO2 high according to patient   . Family history of ovarian cancer   . GERD (gastroesophageal reflux disease)   . History of melanoma   . History of prostate cancer   . HTN (hypertension)   . Mitral valve prolapse   . Osteoporosis   . TIA (transient ischemic attack)   . UTI (lower urinary tract infection)     SOCIAL HX:  Social History   Tobacco Use  . Smoking status: Never Smoker  . Smokeless tobacco: Never Used  Substance Use Topics  . Alcohol use: Yes    Comment: SOCIAL   FAMILY HX:  Family History  Problem Relation Age of Onset  . Heart disease Father   . Heart disease Mother   . Cirrhosis Brother   . Ulcers Brother   . Ovarian cancer Other        d. 70    ALLERGIES:  Allergies  Allergen Reactions  . Penicillins Itching and Rash    Has patient had a PCN reaction causing immediate rash, facial/tongue/throat swelling, SOB or lightheadedness with hypotension: YES Has patient had a PCN reaction causing severe rash involving mucus membranes or skin necrosis: NO Has patient had a PCN reaction that required hospitalization: NO Has patient had a PCN reaction  occurring within the last 10 years: NO If all of the above answers are "NO", then may proceed with Cephalosporin use.     PERTINENT MEDICATIONS:  Outpatient Encounter Medications as of 04/07/2020  Medication Sig  . albuterol (PROVENTIL) (2.5 MG/3ML) 0.083% nebulizer solution Take 3 mLs (2.5 mg total) by nebulization every 6 (six) hours as needed for wheezing or shortness of breath. (Patient not taking: Reported on 03/07/2020)  . aspirin 81 MG tablet Take 81 mg by mouth daily.    Marland Kitchen atorvastatin (LIPITOR) 10 MG tablet TAKE 1 TABLET BY MOUTH  DAILY  . budesonide (PULMICORT) 0.5 MG/2ML nebulizer solution Take 2 mLs (0.5 mg total) by nebulization daily. (Patient not taking: Reported on 02/02/2020)  . busPIRone (BUSPAR) 5 MG tablet Take 1 tablet two times a day as needed for anxiety (Patient not taking: Reported on 03/07/2020)  . Cholecalciferol 50 MCG (2000 UT) CAPS Take by mouth.  . escitalopram (LEXAPRO) 20 MG tablet Take 1 tablet (20 mg total) by mouth daily.  . Lidocaine-Menthol (CBD4 FREEZE PUMP MAXIMUM STR EX) Apply topically.  Marland Kitchen losartan (COZAAR) 50 MG tablet Take 25 mg by mouth daily.  . Melatonin-Pyridoxine (MELATIN PO) Take 3 mg by mouth.  . rivastigmine (EXELON) 4.6 mg/24hr Place 1 patch (4.6 mg total) onto the skin daily.  . vitamin C (ASCORBIC ACID) 500 MG  tablet Take 500 mg by mouth daily.   No facility-administered encounter medications on file as of 04/07/2020.    Thank you for the opportunity to participate in the care of Mr. Darragh Nay. The palliative care team will continue to follow. Please call our office at [phone] if we can be of additional assistance.  [name], DNP, AGPCNP-BC

## 2020-04-18 ENCOUNTER — Telehealth: Payer: Self-pay | Admitting: Neurology

## 2020-04-18 NOTE — Telephone Encounter (Signed)
Pt Daughter called no voice mail answered will try to call back

## 2020-04-18 NOTE — Telephone Encounter (Signed)
Patient's daughter called in stating there have been some changes. He has been talking in his sleep, has had problems with his balance, and keeps saying "something is messed up in his head".

## 2020-04-18 NOTE — Telephone Encounter (Signed)
Pls get more info on problems with balance and other concerns. Main thing to make sure about in dementia patients with sudden changes are to make sure he is not having an infection and to contact PCP for bloodwork/UA again. Thanks

## 2020-04-21 NOTE — Telephone Encounter (Signed)
No answer at 04/22/2019 348

## 2020-04-26 ENCOUNTER — Telehealth: Payer: Self-pay | Admitting: Neurology

## 2020-04-26 NOTE — Telephone Encounter (Signed)
Patient's wife called with questions about whether Dr. Delice Lesch knows of any teaching hospitals or research studies the patient may be a candidate for?  Also, please see telephone note from 04/18/20. Patient's wife is requesting a call back about that as they never spoke with anyone clinical in the office.

## 2020-04-26 NOTE — Telephone Encounter (Signed)
Per Delice Lesch reply: TO 04/18/20 note Pls get more info on problems with balance and other concerns. Main thing to make sure about in dementia patients with sudden changes are to make sure he is not having an infection and to contact PCP for bloodwork/UA again. Thanks.   Try calling pt, No answer. Unable to LVM.

## 2020-05-08 ENCOUNTER — Ambulatory Visit: Payer: Medicare Other | Admitting: Cardiology

## 2020-05-09 ENCOUNTER — Other Ambulatory Visit: Payer: Self-pay

## 2020-05-09 ENCOUNTER — Other Ambulatory Visit: Payer: Medicare Other | Admitting: Nurse Practitioner

## 2020-05-09 DIAGNOSIS — Z515 Encounter for palliative care: Secondary | ICD-10-CM

## 2020-05-09 DIAGNOSIS — F0281 Dementia in other diseases classified elsewhere with behavioral disturbance: Secondary | ICD-10-CM

## 2020-05-09 NOTE — Telephone Encounter (Signed)
Spoke with pt wife informed her that we will mail her a green folder with dementia information out. Pt wife advised that it was not uncommon that we hear of family member noticing patients talking in their sleep and that it is no need for him to be on medication for that.

## 2020-05-09 NOTE — Telephone Encounter (Signed)
Not quite sure what she is asking with level or categories of dementia. Is she asking about stages of dementia? If this is her question, pls let her know we can send her the green folder with all the stages of dementia and what to expect.   2. It is not uncommon that we hear family members notice that the patients talk in sleep. No need for medication for this.  Thanks

## 2020-05-09 NOTE — Telephone Encounter (Signed)
Wife expressed the concern that the pt is more alert now.  No compaint of balance issues., he not walking as much any more. Wife also reports the pt talking in his sleep.  1. wife wanted to know is there different level or categories of dementia. 2. Is it normal for the pt to talk in his sleep?   Please advise

## 2020-05-09 NOTE — Telephone Encounter (Signed)
Multiple attempts made. No answer.

## 2020-05-09 NOTE — Progress Notes (Signed)
[facility] Consult Note Telephone: [phone]  Fax: [phone]  PATIENT NAME: [name] [address] [place] [address] [phone] (home) [phone] (work) DOB: [birth year] MRN: [id number]  PRIMARY CARE PROVIDER:    [name], MD,  [place] [address] [phone]  REFERRING PROVIDER:   [name], MD [place],  [place] [address] [phone]  RESPONSIBLE PARTY:   Extended Emergency Contact Information Primary Emergency Contact: [name],[name] Address: [address]          [place], [place] [address] [name] of [place] Phone: [phone] Mobile Phone: [phone] Relation: Spouse Secondary Emergency Contact: [name] of [place] Phone: [phone] Relation: Daughter  I met face to face with patient in [facility] facility.  ASSESSMENT AND RECOMMENDATIONS:   Advance Care Planning: Reviewed ACP with family over the telephone. Patient's goal of care is comfort. He has a living will. Has a signed DNR and MOST form in home and on [facility] EMR. Details of MOST form include: DNR/no CPR, Full scope of treatment. Antibiotics if indicated, IVF for a defined trial period, Feeding tube for a defined trial period.   Cognitive / Functional decline/ Symptom Management:  Patient awake and alert, confused and non English speaking-language loss due to progression of dementia. Patient totally dependent on persons for all his ADLs. He is however able to feed self finger, family report good appetite. No report of falls, has not been treated for any acute illness in the last month. Behavior related to Dementia currently controlled on current regimen of Lexapro $RemoveBe'20mg'[id number]$ , Exelon patch and use of CBD oil. Facility staff report improvement in behavior, report patient more cooperative with assistance during personal care and  increase use of words. Provided general support and encouragement, no other unmet needs identified at this time.  Follow up Palliative Care Visit: Palliative care will continue to follow for goals of care clarification and symptom management. Return in about 4-8 weeks or prn.  Family /Caregiver/Community Supports: Patient lives at home with his wife. His daughter and her husband lives in the home. Patient goes to an Adult day care center Monday to Friday from 10am to 5pm. He has a paid male care giver that assist in his care from 5pm to 8pm Monday to Friday.  I spent 30 minutes providing this consultation, from 4pm to 4:30pm. More than 50% of the time in this consultation was spent counseling and coordinating communication.   CHIEF COMPLAINT: Routine follow-up palliative care visit  History obtained from review of EMR and  interview with family and caregiver. Records reviewed and summarized bellow.  HISTORY OF PRESENT ILLNESS:[name] a 80 y.o.year old malewith multiple medical problems including Dementia(FAST7b), Asthma, CAD, history of prostate cancer. Palliative Care was asked to help address advance care planning, goals of care and symptoms management. This is afollow up visitfrom 04/07/2020.  CODE STATUS: DNR  PPS: 60%  HOSPICE ELIGIBILITY/DIAGNOSIS: TBD  PHYSICAL EXAM / ROS:   General:wellappearing, cooperative, NAD Cardiovascular: noreport of chest pain, noedema  Pulmonary: no cough, noSOB, room air Abdomen:no report ofconstipation, incontinent of bowel GU:no report ofdysuria, incontinent of urine MSK: no joint and ROM abnormalities, ambulatory Skin: no rashes or wounds reported or noted on exposed skin Neurological: confused, otherwise nonfocal Psych: non -anxious affect  PAST MEDICAL HISTORY:  Past Medical History:  Diagnosis Date  . Asthma   . CAD (coronary artery disease)   .  Cancer [facility]) 2016   prostate  . Complication of anesthesia     CO2 high according to patient   . Family history of ovarian cancer   . GERD (gastroesophageal reflux disease)   . History of melanoma   . History of prostate cancer   . HTN (hypertension)   . Mitral valve prolapse   . Osteoporosis   . TIA (transient ischemic attack)   . UTI (lower urinary tract infection)     SOCIAL HX:  Social History   Tobacco Use  . Smoking status: Never Smoker  . Smokeless tobacco: Never Used  Substance Use Topics  . Alcohol use: Yes    Comment: SOCIAL   FAMILY HX:  Family History  Problem Relation Age of Onset  . Heart disease Father   . Heart disease Mother   . Cirrhosis Brother   . Ulcers Brother   . Ovarian cancer Other        d. 73    ALLERGIES:  Allergies  Allergen Reactions  . Penicillins Itching and Rash    Has patient had a PCN reaction causing immediate rash, facial/tongue/throat swelling, SOB or lightheadedness with hypotension: YES Has patient had a PCN reaction causing severe rash involving mucus membranes or skin necrosis: NO Has patient had a PCN reaction that required hospitalization: NO Has patient had a PCN reaction occurring within the last 10 years: NO If all of the above answers are "NO", then may proceed with Cephalosporin use.     PERTINENT MEDICATIONS:  Outpatient Encounter Medications as of 05/09/2020  Medication Sig  . albuterol (PROVENTIL) (2.5 MG/3ML) 0.083% nebulizer solution Take 3 mLs (2.5 mg total) by nebulization every 6 (six) hours as needed for wheezing or shortness of breath. (Patient not taking: Reported on 03/07/2020)  . aspirin 81 MG tablet Take 81 mg by mouth daily.    Marland Kitchen atorvastatin (LIPITOR) 10 MG tablet TAKE 1 TABLET BY MOUTH  DAILY  . budesonide (PULMICORT) 0.5 MG/2ML nebulizer solution Take 2 mLs (0.5 mg total) by nebulization daily. (Patient not taking: Reported on 02/02/2020)  . busPIRone (BUSPAR) 5 MG tablet Take 1 tablet two times a day as needed for anxiety (Patient not taking: Reported on  03/07/2020)  . Cholecalciferol 50 MCG (2000 UT) CAPS Take by mouth.  . escitalopram (LEXAPRO) 20 MG tablet Take 1 tablet (20 mg total) by mouth daily.  . Lidocaine-Menthol (CBD4 FREEZE PUMP MAXIMUM STR EX) Apply topically.  Marland Kitchen losartan (COZAAR) 50 MG tablet Take 25 mg by mouth daily.  . Melatonin-Pyridoxine (MELATIN PO) Take 3 mg by mouth.  . rivastigmine (EXELON) 4.6 mg/24hr Place 1 patch (4.6 mg total) onto the skin daily.  . vitamin C (ASCORBIC ACID) 500 MG tablet Take 500 mg by mouth daily.   No facility-administered encounter medications on file as of 05/09/2020.    Thank you for the opportunity to participate in the care of [name]. The palliative care team will continue to follow. Please call our office at [phone] if we can be of additional assistance.   [name], DNP, AGPCNP-BC

## 2020-05-23 ENCOUNTER — Telehealth: Payer: Self-pay

## 2020-05-31 ENCOUNTER — Telehealth: Payer: Self-pay | Admitting: *Deleted

## 2020-05-31 ENCOUNTER — Telehealth: Payer: Medicare Other | Admitting: Cardiology

## 2020-05-31 ENCOUNTER — Encounter: Payer: Self-pay | Admitting: Cardiology

## 2020-05-31 VITALS — BP 130/75 | Resp 16 | Ht 69.0 in | Wt 174.0 lb

## 2020-05-31 DIAGNOSIS — I2510 Atherosclerotic heart disease of native coronary artery without angina pectoris: Secondary | ICD-10-CM

## 2020-05-31 DIAGNOSIS — Z9889 Other specified postprocedural states: Secondary | ICD-10-CM

## 2020-05-31 DIAGNOSIS — I10 Essential (primary) hypertension: Secondary | ICD-10-CM

## 2020-05-31 DIAGNOSIS — I350 Nonrheumatic aortic (valve) stenosis: Secondary | ICD-10-CM

## 2020-05-31 NOTE — Progress Notes (Signed)
Virtual Visit via Video Note: This visit type was conducted due to national recommendations for restrictions regarding the COVID-19 Pandemic (e.g. social distancing).  This format is felt to be most appropriate for this patient at this time.  All issues noted in this document were discussed and addressed.  No physical exam was performed (except for noted visual exam findings with Telehealth visits).  The patient has consented to conduct a Telehealth visit and understands insurance will be billed.   I connected with@, on 05/31/20 at  by a video enabled telemedicine application and verified that I am speaking with the correct person using two identifiers.   I discussed the limitations of evaluation and management by telemedicine and the availability of in person appointments. The patient expressed understanding and agreed to proceed.   I have discussed with patient regarding the safety during COVID Pandemic and steps and precautions to be taken including social distancing, frequent hand wash and use of detergent soap, gels with the patient. I asked the patient to avoid touching mouth, nose, eyes, ears with the hands. I encouraged regular walking around the neighborhood and exercise and regular diet, as long as social distancing can be maintained.   Primary Physician/Referring:  [name], MD  Patient ID: [name], male    DOB: 1940-07-11, 80 y.o.   MRN: [id number]  Chief Complaint  Patient presents with  . Coronary Artery Disease  . Follow-up    1 year  . Cardiac Valve Problem   HPI:    [name]  is a 80 y.o.  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 S/P right thumb partial amputation in 2017, along with lymph node dissection and axilla presents here for annual visit.   He has developed severe dementia and is now being referred for palliative care.  He is also going to adult daycare center during the day 5 days a week.  He was essentially nonverbal, his daughter and wife are present and explaining his symptoms.  Patient presented sleeping and does not appear to be in acute distress.  Past Medical History:  Diagnosis Date  . Asthma   . CAD (coronary artery disease)   . Cancer [facility]) 2016   prostate  . Complication of anesthesia    CO2 high according to patient   . Family history of ovarian cancer   . GERD (gastroesophageal reflux disease)   . History of melanoma   . History of prostate cancer   . HTN (hypertension)   . Mitral valve prolapse   . Osteoporosis   . TIA (transient ischemic attack)   . UTI (lower urinary tract infection)    Past Surgical History:  Procedure Laterality Date  . APPENDECTOMY    . CORONARY ANGIOPLASTY WITH STENT PLACEMENT  04/30/2010   PCI and stenting of proximal RCA  . MITRAL VALVE REPAIR  06/13/2010   right mini thoracotomy for complex valvuloplasty with 18m Memo ring annuloplasty - Dr. ORoxy Manns . NAILBED REPAIR Right 05/02/2015   Procedure: BIOPSY NAIL BED RIGHT THUMB;  Surgeon: GDaryll Brod MD;  Location: [facility]  Service: Orthopedics;  Laterality: Right;  ANESTHESIA:  [facility]  . PROSTATECTOMY  2016   Social History   Socioeconomic History  . Marital status: Married    Spouse name: Not on file  . Number of children: 2  . Years of education: MS  . Highest education level: Not on file  Occupational History  . Occupation: retired    Fish farm manager: RETIRED  Tobacco Use  . Smoking status: Never Smoker  . Smokeless tobacco: Never Used  Vaping Use  . Vaping Use: Never used  Substance and Sexual Activity  .  Alcohol use: Not Currently    Comment: SOCIAL  . Drug use: No  . Sexual activity: Not Currently    Partners: Female  Other Topics Concern  . Not on file  Social History Narrative   Patient drinks coffee daily       Lives with wife, daughter and son in law      Left handed      Master's degree   Social Determinants of Health   Financial Resource Strain: Not on file  Food Insecurity: Not on file  Transportation Needs: Not on file  Physical Activity: Not on file  Stress: Not on file  Social Connections: Not on file  Intimate Partner Violence: Not on file   ROS  Review of Systems  Cardiovascular: Negative for chest pain, dyspnea on exertion and leg swelling.  Musculoskeletal: Positive for joint pain and muscle weakness.  Gastrointestinal: Negative for melena.  Psychiatric/Behavioral: Positive for depression and memory loss.   Objective   Vitals with BMI 05/31/2020 03/07/2020 02/02/2020  Height 5' 9"  5' 9"  5' 7"   Weight 174 lbs 163 lbs 160 lbs  BMI 25.68 44.62 86.38  Systolic 177 - -  Diastolic 75 - -  Pulse - - -   Blood pressure 130/75, resp. rate 16, height 5' 9"  (1.753 m), weight 174 lb (78.9 kg).  Physical exam not performed or limited due to virtual visit.  Patient appeared to be in no distress, Neck was supple, respiration was not labored.  Please see exam details from prior visit is as below.  Physical Exam Constitutional:      Appearance: He is well-developed.  HENT:     Head: Atraumatic.  Eyes:     Conjunctiva/sclera: Conjunctivae normal.  Neck:     Thyroid: No thyromegaly.     Vascular: No JVD.  Cardiovascular:     Rate and Rhythm: Normal rate and regular rhythm.     Pulses: Normal pulses and intact distal pulses.          Carotid pulses are on the right side with bruit and on the left side with bruit.    Heart sounds: Murmur heard.   Harsh midsystolic murmur is present with a grade of 3/6 at the upper right sternal border radiating to the  neck. High-pitched blowing decrescendo early diastolic murmur is present with a grade of 2/4 at the upper right sternal border radiating to the apex. No gallop.      Comments: No leg edema, no JVD. Pulmonary:     Effort: Pulmonary effort is normal.     Breath sounds: Examination of the right-lower field reveals decreased breath sounds. Decreased breath sounds present.  Abdominal:     General: Bowel sounds are normal.     Palpations: Abdomen is soft.  Musculoskeletal:        General: Normal range of motion.     Cervical back: Neck supple.  Skin:    General: Skin is warm and  dry.  Neurological:     Mental Status: He is alert.    Laboratory examination:   WBC 9.0  4.5 - 11.0  RBC 5.00  4.20 - 5.40  HGB 15.8  13.5 - 18.0  HCT 47.9  37.0 - 47.0  PLT 174  140 - 400  MCV 95.8  80.0 - 100.0  MCH 31.6  26.0 - 33.0  MCHC 33.0  32.0 - 36.0  RDW 13.1    MPV 11.8    NE%- 64.6  42.2 - 75.2  LY% 19.3  20.5 - 51.1  EO% 3.4  0.0 - 7.0  BA% 0.6  0.0 - 2.0  MO % 12.1  5.0 - 12.0  Comprehensive Metabolic Panel  3212-24-82   Glucose 95  65-99  BUN 34  8-27  Creatinine 1.08  0.76-1.27  eGFR If NonAfricn Am 65  >59  eGFR If Africn Am 76  >59  BUN/Creatinine Ratio 31  10-24  Sodium 140  134-144  Potassium 4.5  3.5-5.2    Labs 02/24/2019: HB 15.7/HCT 47.16, platelets 192.  Serum glucose 86, BUN 16, creatinine 0.98, potassium 5.1, eGFR greater than 60 him up.  CMP normal.  Total cholesterol 122, triglycerides 54, HDL 53, LDL 57.  PSA normal.  TSH normal.  03/18/2018: Cholesterol 179, triglycerides 79, HDL 71, LDL 92. Creatinine 1.1, EGFR 64/74, potassium 4.8, CMP normal.  Medications and allergies   Allergies  Allergen Reactions  . Penicillins Itching and Rash    Has patient had a PCN reaction causing immediate rash, facial/tongue/throat swelling, SOB or lightheadedness with hypotension: YES Has patient had a PCN reaction causing severe rash involving mucus  membranes or skin necrosis: NO Has patient had a PCN reaction that required hospitalization: NO Has patient had a PCN reaction occurring within the last 10 years: NO If all of the above answers are "NO", then may proceed with Cephalosporin use.    Current Outpatient Medications on File Prior to Visit  Medication Sig Dispense Refill  . Ascorbic Acid (VITAMIN C) 1000 MG tablet Take 1,000 mg by mouth daily.    Marland Kitchen aspirin 81 MG tablet Take 81 mg by mouth daily.    Marland Kitchen atorvastatin (LIPITOR) 10 MG tablet TAKE 1 TABLET BY MOUTH  DAILY 90 tablet 3  . calcium carbonate (TUMS - DOSED IN MG ELEMENTAL CALCIUM) 500 MG chewable tablet Chew 1 tablet by mouth daily.    . Cholecalciferol 50 MCG (2000 UT) CAPS Take by mouth.    . escitalopram (LEXAPRO) 20 MG tablet Take 1 tablet (20 mg total) by mouth daily. 90 tablet 3  . losartan (COZAAR) 50 MG tablet Take 50 mg by mouth daily.    . Melatonin-Pyridoxine (MELATIN PO) Take 3 mg by mouth.    . rivastigmine (EXELON) 4.6 mg/24hr Place 1 patch (4.6 mg total) onto the skin daily. 30 patch 11   No current facility-administered medications on file prior to visit.    Radiology:  No results found. Cardiac Studies:   Coronary Angiography  08-May-2010: LM, LAD, Cx normal. Prox and mid LAD 80% S/P PTCA and stenting Prox RCA 3.5x20 and mid RCA 4x12 liberte non DES. IVUS guided.  Event Monitor 04/01/18: NSR. No symptoms reported.  Carotid duplex 02/27/2018: No hemodynamically significant arterial disease in the internal carotid artery bilaterally. There is mild diffuse mixed plaque in bilateral carotid arteries. Antegrade right vertebral artery flow. Antegrade left vertebral artery flow.   Echocardiogram 02/27/2018: Left ventricle cavity is normal in size. Moderate concentric  hypertrophy of the left ventricle. Normal global wall motion. Calculated EF 64%. Diastolic assessment limited due to mitral annular calcification and valvular dysfunction. Left atrial cavity is  mildly dilated. Functionally bicuspid aortic valve with moderate calcification. Moderate to severe aortic valve stenosis. Aortic valve mean gradient of 33 mmHg, Vmax of 3.7 m/s. Calculated aortic valve area by continuity equation is 0.9 cm, AVAi 0.5 cm2/m2. Mild to moderate aortic regurgitation. Prior mitral valve repair with mild leaflet restriction. Mild calcific mitral valve stenosis stenosis. Mean PG 4 mmHg at HR 69 bpm. MVA 1.7 cm2 by PHT method. Mild to moderate mitral regurgitation. Mild tricuspid regurgitation. Estimated pulmonary artery systolic pressure 32 mmHg. Compared to previous study on 02/05/2017, there is further progression of aortic stenosis severity. Mild pulmonary hypertension is new.  Assessment   No diagnosis found.  EKG 03/24/2019: Normal sinus rhythm at rate of 74 bpm, normal axis.  No evidence of ischemia.  Single PAC.  EKG 02/04/2018: Normal sinus rhythm at rate of 69 bpm, normal axis. No evidence of ischemia, normal EKG.  Recommendations:  No orders of the defined types were placed in this encounter.   [name]  is a 80 y.o. 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 S/P right thumb partial amputation in 2017, along with lymph node dissection and axilla presents here for annual visit.   He has developed severe dementia and is now being referred for palliative care.  He is also going to adult daycare center during the day 5 days a week.  I reviewed his external records, blood pressure has been well controlled, there is no PND or orthopnea, no leg edema.  He is  presently 80 years of age and goals of therapy discussed with the patient's family.  They understand that there will be no aggressive attempts or procedures performed and we will treat him symptomatically only.  I have made him as needed visits with me, but they are aware that I am available if he were to be in the hospital or he needs cardiac care and also available to see him in the office if necessary.  They will continue to update me with any medication changes or if they need any help.  I spent 25 minutes with them.  Examination limited as patient was sleeping.  This is a virtual visit.   Adrian Prows, MD, [facility] 05/31/2020, 9:57 AM Office: [phone] Pager: [phone]

## 2020-05-31 NOTE — Telephone Encounter (Signed)
Patient's wife is calling with concerns about appointment, was given date and time.Verbalized understanding.

## 2020-06-05 NOTE — Telephone Encounter (Signed)
error 

## 2020-06-07 ENCOUNTER — Other Ambulatory Visit: Payer: Self-pay

## 2020-06-07 ENCOUNTER — Encounter: Payer: Self-pay | Admitting: Neurology

## 2020-06-07 ENCOUNTER — Telehealth (INDEPENDENT_AMBULATORY_CARE_PROVIDER_SITE_OTHER): Payer: Medicare Other | Admitting: Neurology

## 2020-06-07 DIAGNOSIS — F03B18 Unspecified dementia, moderate, with other behavioral disturbance: Secondary | ICD-10-CM

## 2020-06-07 DIAGNOSIS — F0391 Unspecified dementia with behavioral disturbance: Secondary | ICD-10-CM

## 2020-06-07 MED ORDER — ESCITALOPRAM OXALATE 20 MG PO TABS
20.0000 mg | ORAL_TABLET | Freq: Every day | ORAL | 3 refills | Status: DC
Start: 2020-06-07 — End: 2021-02-14

## 2020-06-07 MED ORDER — RIVASTIGMINE 4.6 MG/24HR TD PT24
4.6000 mg | MEDICATED_PATCH | Freq: Every day | TRANSDERMAL | 11 refills | Status: DC
Start: 2020-06-07 — End: 2021-02-14

## 2020-06-07 NOTE — Progress Notes (Signed)
Virtual Visit via Video Note The purpose of this virtual visit is to provide medical care while limiting exposure to the novel coronavirus.    Consent was obtained for video visit:  Yes.   Answered questions that patient had about telehealth interaction:  Yes.   I discussed the limitations, risks, security and privacy concerns of performing an evaluation and management service by telemedicine. I also discussed with the patient that there may be a patient responsible charge related to this service. The patient expressed understanding and agreed to proceed.  Pt location: [facility] Physician Location: office Name of referring provider:  [name], MD I connected with [name] at patients wife's initiation/request on 06/07/2020 at  2:00 PM EST by video enabled telemedicine application and verified that I am speaking with the correct person using two identifiers. Pt MRN:  [id number] Pt DOB:  21-Jul-1940 Video Participants:  [name];  [name] (spouse)   History of Present Illness:  The patient had a virtual video visit on 06/07/2020. He was last seen 3 months ago for moderate to severe dementia. Palliative Care is now involved with his care. His wife reports he is doing good, they see positive signs from time to time. He continues on Rivastigmine patch 4.6mg  daily and Lexapro 20mg  daily. Sometimes they have a hard time finding the patch and he may have 2 patches on at one time, or sometimes they miss a patch by mistake. They also give him CBD which helps him. He is at [facility] day program today, walking around without assistance. He speaks in Hindi during the visit, his wife reports he speaks in [place] or Hindi, but still uses [place] a lot. They have a male aide coming 5 nights a week to help with exercise, dressing/bathing. He is peaceful and not combative, only occurring once in a while when he really wants something. He is sleeping better. He is  able to feed himself. His wife usually brushes his teeth, but this morning he brushed his teeth when she gave him the electric toothbrush at the suggestion of his dentist. He wears a pull-up most of the time, now having some bowel incontinence as well. He is good with his new granddaughter [name], saying "she is the best" and holding her when sitting.    History on Initial Assessment 11/23/2018: This is a 80 year old left-handed man with a history of hypertension, prostate cancer, presenting to establish care for dementia. Family reports memory changes started 3-4 years ago where he would not remember names. Later on he did not recognize friends. His wife reports taking over finances in 2019, it appears he still did the taxes in 2019 (despite being diagnosed with dementia in 2018). He has been evaluated by neurologist Dr. Brett Fairy in 2018.  [place] 23/30 in March 2018, 17/30 in April 2019. He stopped driving a year ago when he would get lost or go past their house. He started going to [facility] in October 2019. He refused to do [facility] testing but was noted to have cognitive deficits in multiple cognitive domains, with affective and behavioral components of delusions, visual hallucinations, irritability. No gait or sleep component. He had side effects on Donepezil and would miss doses of Rivastigmine. He was switched to the transdermal patch 4.6mg  daily. He had a paraneoplastic panel done which was negative. His last visit at [facility] was 2 months ago, he has seen Psychiatry and done well with addition of Lexapro  for anxiety. It was noted that dose of rivastigmine was not increased due to syncopal episode. Diagnosis of late onset dementia,probable Alzheimer's disease, cognitive deficits are moderate to severe.   Family initially felt that the rivastigmine patch was "overacting," so they tried administering the patch every other day. They noticed worsening and put him back on daily patches. He  remembers things better and helps around the house. He can be stubborn and very demanding, wanting to get dressed and shaved early in the morning. Wife reports he is very concerned and worried, he keeps repeating shaving because it is not as good as he wants. He For the past few months, he has been rummaging a lot in the house. He does not realized ownership of things and would pick up things that are not his and start using it. He gets confused using his electric shaver. They report sleep and appetite are good. Family reports he craves sweets. Family denies any hallucinations since starting the patches. He does not read as much. They deny any paranoia, most of the time he is calm, once in a while he gets a burst and gets mad or upset. He was more emotional when they visited [place] 1.5 years ago, family has not noticed this recently but do wonder about depression.   He denies any headaches, dizziness, vision changes, dysarthria/dysphagia, neck/back pain, focal numbness/tingling/weakness, bowel/bladder dysfunction, anosmia, or tremors. He denies any falls then his wife reminds him she fell and he states he was carrying a lot of things then. He states he "remembers everything."   He had an MRI brain in 03/2018 which I personally reviewed showing moderate diffuse atrophy and mild chronic microvascular disease.   Current Outpatient Medications on File Prior to Visit  Medication Sig Dispense Refill  . Ascorbic Acid (VITAMIN C) 1000 MG tablet Take 1,000 mg by mouth daily.    Marland Kitchen aspirin 81 MG tablet Take 81 mg by mouth daily.    Marland Kitchen atorvastatin (LIPITOR) 10 MG tablet TAKE 1 TABLET BY MOUTH  DAILY 90 tablet 3  . calcium carbonate (TUMS - DOSED IN MG ELEMENTAL CALCIUM) 500 MG chewable tablet Chew 1 tablet by mouth daily.    . Cholecalciferol 50 MCG (2000 UT) CAPS Take by mouth.    . escitalopram (LEXAPRO) 20 MG tablet Take 1 tablet (20 mg total) by mouth daily. 90 tablet 3  . losartan (COZAAR) 50 MG tablet Take 50  mg by mouth daily.    . Melatonin-Pyridoxine (MELATIN PO) Take 3 mg by mouth. Takes at [place] Take by mouth 2 (two) times daily. CBD oil BID    . rivastigmine (EXELON) 4.6 mg/24hr Place 1 patch (4.6 mg total) onto the skin daily. 30 patch 11   No current facility-administered medications on file prior to visit.     Observations/Objective:   GEN:  The patient appears stated age and is in NAD.  Neurological examination: Patient is awake, alert. He speaks in [place] and does not answer in [place] when asked questions. He waves to the camera. Unable to follow commands. Cranial nerves: Extraocular movements intact. No facial asymmetry. Motor: moves all extremities symmetrically, at least anti-gravity x 4. Gait: narrow-based and steady, no ataxia   Assessment and Plan:   This is a 80 yo RH man with a history of hypertension, prostate cancer, with moderate to severe dementia, likely due to Alzheimer's disease. MMSE 6/30 in August 2020, unable to do MMSE, he mostly speaks in [place]  during the visit and does not answer questions or follow commands. He waves to the camera. He is overall stable, behaviors controlled on current regimen. Refills sent for Rivastigmine 4.6mg /day and Lexapro 20mg  daily. Caregiver support provided. Continue close supervision. Follow-up in 6 months, they know to call for any changes.    Follow Up Instructions:   -I discussed the assessment and treatment plan with the patient's wife. The patient's wife was provided an opportunity to ask questions and all were answered. The patient's wife agreed with the plan and demonstrated an understanding of the instructions.   The patient's wife was advised to call back or seek an in-person evaluation if the symptoms worsen or if the condition fails to improve as anticipated.     Cameron Sprang, MD

## 2020-06-07 NOTE — Patient Instructions (Signed)
Good to see you. Continue all medications, refills have been sent. Congratulations on your new grandchild, welcome [name]!  Follow-up in 6 months, call for any changes.   FALL PRECAUTIONS: Be cautious when walking. Scan the area for obstacles that may increase the risk of trips and falls. When getting up in the mornings, sit up at the edge of the bed for a few minutes before getting out of bed. Consider elevating the bed at the head end to avoid drop of blood pressure when getting up. Walk always in a well-lit room (use night lights in the walls). Avoid area rugs or power cords from appliances in the middle of the walkways. Use a walker or a cane if necessary and consider physical therapy for balance exercise. Get your eyesight checked regularly.  HOME SAFETY: Consider the safety of the kitchen when operating appliances like stoves, microwave oven, and blender. Consider having supervision and share cooking responsibilities until no longer able to participate in those. Accidents with firearms and other hazards in the house should be identified and addressed as well.  ABILITY TO BE LEFT ALONE: If patient is unable to contact 911 operator, consider using LifeLine, or when the need is there, arrange for someone to stay with patients. Smoking is a fire hazard, consider supervision or cessation. Risk of wandering should be assessed by caregiver and if detected at any point, supervision and safe proof recommendations should be instituted.   RECOMMENDATIONS FOR ALL PATIENTS WITH MEMORY PROBLEMS: 1. Continue to exercise (Recommend 30 minutes of walking everyday, or 3 hours every week) 2. Increase social interactions - continue going to [place] and enjoy social gatherings with friends and family 3. Eat healthy, avoid fried foods and eat more fruits and vegetables 4. Maintain adequate blood pressure, blood sugar, and blood cholesterol level. Reducing the risk of stroke and cardiovascular disease also helps  promoting better memory. 5. Avoid stressful situations. Live a simple life and avoid aggravations. Organize your time and prepare for the next day in anticipation. 6. Sleep well, avoid any interruptions of sleep and avoid any distractions in the bedroom that may interfere with adequate sleep quality 7. Avoid sugar, avoid sweets as there is a strong link between excessive sugar intake, diabetes, and cognitive impairment The Mediterranean diet has been shown to help patients reduce the risk of progressive memory disorders and reduces cardiovascular risk. This includes eating fish, eat fruits and green leafy vegetables, nuts like almonds and hazelnuts, walnuts, and also use olive oil. Avoid fast foods and fried foods as much as possible. Avoid sweets and sugar as sugar use has been linked to worsening of memory function.  There is always a concern of gradual progression of memory problems. If this is the case, then we may need to adjust level of care according to patient needs. Support, both to the patient and caregiver, should then be put into place.

## 2020-06-21 ENCOUNTER — Ambulatory Visit: Payer: Medicare Other | Admitting: Podiatry

## 2020-06-21 ENCOUNTER — Encounter: Payer: Self-pay | Admitting: Podiatry

## 2020-06-21 ENCOUNTER — Other Ambulatory Visit: Payer: Self-pay

## 2020-06-21 DIAGNOSIS — I7300 Raynaud's syndrome without gangrene: Secondary | ICD-10-CM

## 2020-06-21 DIAGNOSIS — I999 Unspecified disorder of circulatory system: Secondary | ICD-10-CM

## 2020-06-21 NOTE — Progress Notes (Signed)
Subjective:  Patient ID: [name], male    DOB: 08/29/1940,  MRN: [id number]  Chief Complaint  Patient presents with  . routine foot care    Nail trim     80 y.o. male presents with the above complaint.  Patient presents with a complaint of discoloration to the toes that are purpleish and darker.  Patient has a history of dementia.  Patient is here with his wife who speaks on his behalf.  She states that this discoloration has been going on for quite some time is mostly on the right foot.  He does not have any history of Raynaud's, or any other autoimmune diseases.  He denies any other acute complaints.  History is also very limited because of his underlying dementia.  He denies any other acute complaints.   Review of Systems: Negative except as noted in the HPI. Denies N/V/F/Ch.  Past Medical History:  Diagnosis Date  . Asthma   . CAD (coronary artery disease)   . Cancer [facility]) 2016   prostate  . Complication of anesthesia    CO2 high according to patient   . Family history of ovarian cancer   . GERD (gastroesophageal reflux disease)   . History of melanoma   . History of prostate cancer   . HTN (hypertension)   . Mitral valve prolapse   . Osteoporosis   . TIA (transient ischemic attack)   . UTI (lower urinary tract infection)     Current Outpatient Medications:  .  Ascorbic Acid (VITAMIN C) 1000 MG tablet, Take 1,000 mg by mouth daily., Disp: , Rfl:  .  aspirin 81 MG tablet, Take 81 mg by mouth daily., Disp: , Rfl:  .  atorvastatin (LIPITOR) 10 MG tablet, TAKE 1 TABLET BY MOUTH  DAILY, Disp: 90 tablet, Rfl: 3 .  calcium carbonate (TUMS - DOSED IN MG ELEMENTAL CALCIUM) 500 MG chewable tablet, Chew 1 tablet by mouth daily., Disp: , Rfl:  .  Cholecalciferol 50 MCG (2000 UT) CAPS, Take by mouth., Disp: , Rfl:  .  escitalopram (LEXAPRO) 20 MG tablet, Take 1 tablet (20 mg total) by mouth daily., Disp: 90 tablet, Rfl: 3 .  losartan (COZAAR) 50 MG tablet, Take 50 mg by  mouth daily., Disp: , Rfl:  .  Melatonin-Pyridoxine (MELATIN PO), Take 3 mg by mouth. Takes at 6pm, Disp: , Rfl:  .  NON FORMULARY, Take by mouth 2 (two) times daily. CBD oil BID, Disp: , Rfl:  .  rivastigmine (EXELON) 4.6 mg/24hr, Place 1 patch (4.6 mg total) onto the skin daily., Disp: 30 patch, Rfl: 11  Social History   Tobacco Use  Smoking Status Never Smoker  Smokeless Tobacco Never Used    Allergies  Allergen Reactions  . Penicillins Itching and Rash    Has patient had a PCN reaction causing immediate rash, facial/tongue/throat swelling, SOB or lightheadedness with hypotension: YES Has patient had a PCN reaction causing severe rash involving mucus membranes or skin necrosis: NO Has patient had a PCN reaction that required hospitalization: NO Has patient had a PCN reaction occurring within the last 10 years: NO If all of the above answers are "NO", then may proceed with Cephalosporin use.   Objective:  There were no vitals filed for this visit. There is no height or weight on file to calculate BMI. Constitutional Well developed. Well nourished.  Vascular Dorsalis pedis pulses non palpable bilaterally. Posterior tibial pulses non palpable bilaterally. Capillary refill normal to all digits.  No  cyanosis or clubbing noted. Pedal hair growth normal.  Neurologic Normal speech. Oriented to person, place, and time. Epicritic sensation to light touch grossly present bilaterally.  Dermatologic  bluish discoloration without signs of gangrene noted to the right toes 1 through 5.  No pain on palpation.  No signs of gangrene noted.  Orthopedic: Normal joint ROM without pain or crepitus bilaterally. No visible deformities. No bony tenderness.   Radiographs: None Assessment:   1. Vascular abnormality   2. Raynaud's phenomenon without gangrene    Plan:  Patient was evaluated and treated and all questions answered.  Right foot Raynaud's phenomenon without signs of gangrene -I  explained to the patient the etiology of Raynaud's phenomenon and various treatment options were discussed.  I discussed with him that this could likely be primary Raynaud's from now versus possible underlying autoimmune diseases.  For now patient also has a component of nonpalpable or vascular abnormality that I will further evaluate.  I will order ABIs PVRs to assess the vascular flow to both lower extremity.  If they are within normal limits I believe patient will benefit from rheumatology follow-up for Raynaud's phenomenon. -ABIs PVRs were ordered  No follow-ups on file.

## 2020-06-23 ENCOUNTER — Other Ambulatory Visit: Payer: Medicare Other | Admitting: Nurse Practitioner

## 2020-06-23 ENCOUNTER — Other Ambulatory Visit: Payer: Self-pay

## 2020-06-23 DIAGNOSIS — Z515 Encounter for palliative care: Secondary | ICD-10-CM

## 2020-06-23 DIAGNOSIS — F02818 Dementia in other diseases classified elsewhere, unspecified severity, with other behavioral disturbance: Secondary | ICD-10-CM

## 2020-06-23 DIAGNOSIS — F0281 Dementia in other diseases classified elsewhere with behavioral disturbance: Secondary | ICD-10-CM

## 2020-06-23 DIAGNOSIS — G3109 Other frontotemporal dementia: Secondary | ICD-10-CM

## 2020-06-23 NOTE — Progress Notes (Addendum)
[facility] Consult Note Telephone: [phone]  Fax: [phone]  PATIENT NAME: [name] [address] [place] [address] [phone] (home) [phone] (work) DOB: 1940-08-12 MRN: [id number]  PRIMARY CARE PROVIDER:    Sueanne Margarita, [place] Associate  REFERRING PROVIDER:   [name], MD [place],  [place] [address] [phone]  RESPONSIBLE PARTY:   Extended Emergency Contact Information Primary Emergency Contact: Rizzi,[name] Address: [address]          [place], [place] [address] [name] of [place] Phone: [phone] Mobile Phone: [phone] Relation: Spouse Secondary Emergency Contact: Ewa Gentry of [place] Phone: [phone] Relation: Daughter  I met face to face with patient is an Adult day program facility of [facility] facilty.  Addendum added to correct CPT code.  ASSESSMENT AND RECOMMENDATIONS:   Advance Care Planning: Goal of care: Patient's goal of care is comfort. Directives: He has a living will. Has a signed DNR and MOST form in home and on [facility] EMR. Details of MOST form include: DNR/no CPR, Full scope of treatment. Antibiotics if indicated, IVF for a defined trial period, Feeding tube for a defined trial period. Family reiterated desire for patient not to be resuscitated in the event of cardiac or respiratory arrest. Provided general support and encouragement.  I spent 17 minutes providing this consultation. More than 50% of the time in this consultation was spent counseling and coordinating communication.  -----------------------------------------------------------------------------------------------  Symptom Management:  Dementia: patient with increased agitation during the day, current medcation regimen include Lexapro 18m daily, Exelon 4.656m24hr patch and CBD oil. Patient uncooperative with exam  today, however no evidence of acute infection observed. No report of fever, chills or increased SOB, family report patient sleeping the night with Melatonin 6m76mFamily report good appetite. Family report patient with history of Vit 12 deficiency, receives routine monthly IM Cyanocobalamin injections, report last injection was a year ago. B12 last checked a year ago was 1200. Family report patient not cooperative with lab draws in the past related to progression of dementia. Recommendation: recommend checking B12 levels and routine labs to rule out metabolic cause for behaviors. May consider administering one time dose of clonazepam 0.48m58m0.5mg 41more lab draw.   Follow up Palliative Care Visit: Palliative care will continue to follow for complex decision making and symptom management. Return 4 weeks or prn.  Family /Caregiver/Community Supports: Patient lived at home with wife. He attends adult day care program from 10am to 5pm Monday to Friday.  Cognitive / Functional decline: Patient awake and alert, very confused. More agitated to day, refused exam during visit.  Patient totally dependent on persons for all his ADLs. He is however able to feed self finger foods. No report of falls.  CHIEF COMPLAINT: increased agitation  History obtained from review of EMR and discussion with family. Records reviewed and summarized bellow.  HISTORY OF PRESENT ILLNESS:Louis V Isharaniis a 78 y.80year old malewith multiple medical problems including Dementia(FAST7b), Asthma, CAD, Asthma, GERD, HTN, Mitral valve prolapse, osteoporosis, history of prostate cancer.Palliative Care was asked to help address advance care planning,goals of careand symptoms management. This is afollow up visitfrom1/25/2022  CODE STATUS: DNR  PPS: 60%  HOSPICE ELIGIBILITY/DIAGNOSIS: TBD  PHYSICAL EXAM / ROS:  Weights: no recent weight reported General:wellappearing, uncooperative, NAD Cardiovascular: noreport of  chest pain, noedema  Pulmonary: no cough, noSOB,  sats 97% on room air Abdomen:no report ofconstipation, incontinent of  bowel GU:no report ofdysuria, incontinent of urine MSK: no joint and ROM abnormalities, ambulatory Skin: no rashes or wounds reportedor noted on exposed skin Neurological: confused, otherwisenonfocal Psych: non -anxious affect   PAST MEDICAL HISTORY:  Past Medical History:  Diagnosis Date  . Asthma   . CAD (coronary artery disease)   . Cancer [facility]) 2016   prostate  . Complication of anesthesia    CO2 high according to patient   . Family history of ovarian cancer   . GERD (gastroesophageal reflux disease)   . History of melanoma   . History of prostate cancer   . HTN (hypertension)   . Mitral valve prolapse   . Osteoporosis   . TIA (transient ischemic attack)   . UTI (lower urinary tract infection)     SOCIAL HX:  Social History   Tobacco Use  . Smoking status: Never Smoker  . Smokeless tobacco: Never Used  Substance Use Topics  . Alcohol use: Not Currently    Comment: SOCIAL   FAMILY HX:  Family History  Problem Relation Age of Onset  . Heart disease Father   . Heart disease Mother   . Cirrhosis Brother   . Ulcers Brother   . Ovarian cancer Other        d. 21    ALLERGIES:  Allergies  Allergen Reactions  . Penicillins Itching and Rash    Has patient had a PCN reaction causing immediate rash, facial/tongue/throat swelling, SOB or lightheadedness with hypotension: YES Has patient had a PCN reaction causing severe rash involving mucus membranes or skin necrosis: NO Has patient had a PCN reaction that required hospitalization: NO Has patient had a PCN reaction occurring within the last 10 years: NO If all of the above answers are "NO", then may proceed with Cephalosporin use.     PERTINENT MEDICATIONS:  Outpatient Encounter Medications as of 06/23/2020  Medication Sig  . Ascorbic Acid (VITAMIN C) 1000 MG tablet Take 1,000 mg by  mouth daily.  Marland Kitchen aspirin 81 MG tablet Take 81 mg by mouth daily.  Marland Kitchen atorvastatin (LIPITOR) 10 MG tablet TAKE 1 TABLET BY MOUTH  DAILY  . calcium carbonate (TUMS - DOSED IN MG ELEMENTAL CALCIUM) 500 MG chewable tablet Chew 1 tablet by mouth daily.  . Cholecalciferol 50 MCG (2000 UT) CAPS Take by mouth.  . escitalopram (LEXAPRO) 20 MG tablet Take 1 tablet (20 mg total) by mouth daily.  Marland Kitchen losartan (COZAAR) 50 MG tablet Take 50 mg by mouth daily.  . Melatonin-Pyridoxine (MELATIN PO) Take 3 mg by mouth. Takes at [place] Take by mouth 2 (two) times daily. CBD oil BID  . rivastigmine (EXELON) 4.6 mg/24hr Place 1 patch (4.6 mg total) onto the skin daily.   No facility-administered encounter medications on file as of 06/23/2020.    Thank you for the opportunity to participate in the care of Mr. Dez Stauffer. The palliative care team will continue to follow. Please call our office at [phone] if we can be of additional assistance.  [name], DNP, AGPCNP-BC

## 2020-07-14 ENCOUNTER — Telehealth: Payer: Self-pay | Admitting: Neurology

## 2020-07-14 NOTE — Telephone Encounter (Signed)
No answer at [phone] 07/14/2020

## 2020-07-14 NOTE — Telephone Encounter (Signed)
Patient's daughter called in stating the patient had an "episode" of his hand uncontrollably shaking last night. They are wondering what could have caused this?

## 2020-07-14 NOTE — Telephone Encounter (Signed)
No answer at 415 07/14/2020

## 2020-07-17 NOTE — Telephone Encounter (Signed)
Sent my chart message, unable to get through

## 2020-07-17 NOTE — Telephone Encounter (Signed)
Called twice again today, phone is cutting off. Will try back.

## 2020-08-08 NOTE — Addendum Note (Signed)
Addended by: Alba Destine on: 08/08/2020 08:53 PM   Modules accepted: Level of Service

## 2020-08-10 ENCOUNTER — Encounter (HOSPITAL_COMMUNITY): Payer: Self-pay

## 2020-08-11 ENCOUNTER — Encounter (HOSPITAL_COMMUNITY): Payer: Medicare Other

## 2020-08-18 ENCOUNTER — Inpatient Hospital Stay (HOSPITAL_COMMUNITY): Admission: RE | Admit: 2020-08-18 | Payer: Medicare Other | Source: Ambulatory Visit

## 2020-08-21 ENCOUNTER — Other Ambulatory Visit: Payer: Self-pay

## 2020-08-21 ENCOUNTER — Ambulatory Visit (HOSPITAL_COMMUNITY)
Admission: RE | Admit: 2020-08-21 | Discharge: 2020-08-21 | Disposition: A | Discharge status: Home / Self Care | Payer: Medicare Other | Source: Ambulatory Visit | Attending: Podiatry | Admitting: Podiatry

## 2020-08-21 DIAGNOSIS — I999 Unspecified disorder of circulatory system: Secondary | ICD-10-CM | POA: Diagnosis not present

## 2020-08-24 ENCOUNTER — Telehealth: Payer: Self-pay | Admitting: Nurse Practitioner

## 2020-08-29 ENCOUNTER — Telehealth: Payer: Self-pay | Admitting: Podiatry

## 2020-08-29 NOTE — Telephone Encounter (Signed)
pts wife called requesting ultra sound results stated he had it a few weeks ago and they have not heard anything. d

## 2020-09-05 ENCOUNTER — Telehealth: Payer: Self-pay | Admitting: Neurology

## 2020-09-05 NOTE — Telephone Encounter (Signed)
Family is requesting help with patient. Requesting neuropsych tesing. Having stools q3 days, urine fool. Is in pallative care but note patient is speaking more full sentences last evening. He is currently at home, can you advise on any recommendations and send to pool. thanks

## 2020-09-05 NOTE — Telephone Encounter (Signed)
Pls let family know that I don't think he will be able to do Neuropsych testing, but we can do the memory screening questions in the office. Not sure what they are asking with stool and urine, would ask PCP for evaluation of those. Can put on Sara's schedule for f/u to do MMSE and address concerns. Thanks

## 2020-09-05 NOTE — Telephone Encounter (Signed)
Tried to call and line beeps, will call back . 10:44am

## 2020-09-05 NOTE — Telephone Encounter (Signed)
Patient daughter called and needs to speak with someone about what is going on with patient. They are trying to get him into the [facility] on 09-11-20  Please call

## 2020-09-06 NOTE — Telephone Encounter (Signed)
Ok, must have been a misunderstanding that they are requesting neuropsych testing. Ok to put on Sara's schedule for tomorrow, thanks

## 2020-09-06 NOTE — Telephone Encounter (Signed)
Spoke with [name] she stated that it is no way he can do the MMSE he doesent know that information  he can no longer write his name asking if they can do a VV with [name] his anxiety is so high they can not get him to leave the house, I could hear him in the back ground and he was getting up set with her talking about an appointment, was getting worked up she had to calm him down. They will call PCP about urine and stool testing.

## 2020-09-07 ENCOUNTER — Encounter: Payer: Self-pay | Admitting: Physician Assistant

## 2020-09-07 ENCOUNTER — Telehealth (INDEPENDENT_AMBULATORY_CARE_PROVIDER_SITE_OTHER): Payer: Medicare Other | Admitting: Physician Assistant

## 2020-09-07 ENCOUNTER — Telehealth: Payer: Self-pay | Admitting: Physician Assistant

## 2020-09-07 ENCOUNTER — Other Ambulatory Visit: Payer: Self-pay

## 2020-09-07 VITALS — Ht 67.0 in | Wt 170.0 lb

## 2020-09-07 DIAGNOSIS — F03B18 Unspecified dementia, moderate, with other behavioral disturbance: Secondary | ICD-10-CM

## 2020-09-07 DIAGNOSIS — F0391 Unspecified dementia with behavioral disturbance: Secondary | ICD-10-CM | POA: Diagnosis not present

## 2020-09-07 NOTE — Telephone Encounter (Signed)
Pt daughter called back and was informed that he needs to call PCP in case he also is having a UTI that needs treatment. Patient is on Palliative Care, who can obtain the sample. Recommend family isolates as well, and tests for Covid. If symptoms worsen, he may need hospitalization pt daughter verbalized understanding

## 2020-09-07 NOTE — Progress Notes (Signed)
Virtual Visit via Video Note The purpose of this virtual visit is to provide medical care while limiting exposure to the novel coronavirus.    Consent was obtained for video visit: yes Answered questions that patient had about telehealth interaction: yes I discussed the limitations, risks, security and privacy concerns of performing an evaluation and management service by telemedicine. I also discussed with the patient that there may be a patient responsible charge related to this service. The patient expressed understanding and agreed to proceed.  Pt location: Home Physician Location: office Name of referring provider:  [name], MD I connected with [name] at patient's daughter initiation/request on 09/07/2020 at  9:30 AM EDT by video enabled telemedicine application and verified that I am speaking with the correct person using two identifiers. Pt MRN:  [id number] Pt DOB:  1940-10-23 Video Participants:  [name];  Daughter Reena    History of Present Illness:  The patient had a virtual video visit on 09/07/2020.  He was last seen 3 months ago for moderate to severe dementia. Palliative Care is now involved with his care. He continues on Rivastigmine patch 4.6mg  daily and Lexapro 20 mg daily without missing any doses.  They also give him CBD which helps him. He goes at [employer] day program, walking around without assistance. He speaks in Hindi during the visit, his wife reports he speaks in [place] or Hindi, but still uses [place] a lot. They have a male aide coming 5 nights a week to help with exercise, dressing/bathing. He is being seen today because he has become very agitated and combative over the last 3-4 days, unable to sleep. His appetite became poor, not wanting his favorite foods such as yogurt, or drinking enough water. His daughter reports that his urine is malodorous and he does not wet enough diapers. No diarrhea. Last Saturday he had a fever of 102F  controlled with Tylenol, he is afebrile today. No chills or night sweats. No nausea or vomiting. He does have a dry cough, No shortness of breath or wheezing.  He has been confused, not recognizing his daughter but he still  "loves [name], the grandaughter dearly" They wanted to place [facility] at [facility] "to live there but he requires too much maintenance, they declined. They are looking at other living nursing facilities at this time.     History on Initial Assessment 11/23/2018: This is a 80 year old left-handed man with a history of hypertension, prostate cancer, presenting to establish care for dementia. Family reports memory changes started 3-4 years ago where he would not remember names. Later on he did not recognize friends. His wife reports taking over finances in 2019, it appears he still did the taxes in 2019 (despite being diagnosed with dementia in 2018). He has been evaluated by neurologist Dr. Brett Fairy in 2018.  [place] 23/30 in March 2018, 17/30 in April 2019. He stopped driving a year ago when he would get lost or go past their house. He started going to the [facility] in October 2019. He refused to do [facility] testing but was noted to have cognitive deficits in multiple cognitive domains, with affective and behavioral components of delusions, visual hallucinations, irritability. No gait or sleep component. He had side effects on Donepezil and would miss doses of Rivastigmine. He was switched to the transdermal patch 4.6mg  daily. He had a paraneoplastic panel done which was negative. His last visit at [facility] was 2 months ago, he has seen  Psychiatry and done well with addition of Lexapro for anxiety. It was noted that dose of rivastigmine was not increased due to syncopal episode. Diagnosis of late onset dementia,probable Alzheimer's disease, cognitive deficits are moderate to severe.   Family initially felt that the rivastigmine patch was "overacting," so they tried administering the  patch every other day. They noticed worsening and put him back on daily patches. He remembers things better and helps around the house. He can be stubborn and very demanding, wanting to get dressed and shaved early in the morning. Wife reports he is very concerned and worried, he keeps repeating shaving because it is not as good as he wants. He For the past few months, he has been rummaging a lot in the house. He does not realized ownership of things and would pick up things that are not his and start using it. He gets confused using his electric shaver. They report sleep and appetite are good. Family reports he craves sweets. Family denies any hallucinations since starting the patches. He does not read as much. They deny any paranoia, most of the time he is calm, once in a while he gets a burst and gets mad or upset. He was more emotional when they visited [place] 1.5 years ago, family has not noticed this recently but do wonder about depression.   He denies any headaches, dizziness, vision changes, dysarthria/dysphagia, neck/back pain, focal numbness/tingling/weakness, bowel/bladder dysfunction, anosmia, or tremors. He denies any falls then his wife reminds him she fell and he states he was carrying a lot of things then. He states he "remembers everything."   He had an MRI brain in 03/2018 which I personally reviewed showing moderate diffuse atrophy and mild chronic microvascular disease.     Current Outpatient Medications  Medication Instructions  . aspirin 81 mg, Oral, Daily,    . atorvastatin (LIPITOR) 10 MG tablet TAKE 1 TABLET BY MOUTH  DAILY  . calcium carbonate (TUMS - DOSED IN MG ELEMENTAL CALCIUM) 500 MG chewable tablet 1 tablet, Oral, Daily  . escitalopram (LEXAPRO) 20 mg, Oral, Daily  . Ketotifen Fumarate (REFRESH EYE ITCH RELIEF OP) Ophthalmic  . losartan (COZAAR) 50 mg, Oral, Daily  . Melatonin-Pyridoxine (MELATIN PO) 3 mg, Oral, Takes at 6pm  . Multiple Vitamins-Minerals (EQ  MULTIVITAMINS ADULT GUMMY PO) Oral, Daily  . NON FORMULARY Oral, 3 times daily, CBD oil BID  . rivastigmine (EXELON) 4.6 mg, Transdermal, Daily  . VitaJoy Daily D Gummies 2,000 Units, Oral  . vitamin C 1,000 mg, Oral, Daily   Observations/Objective:     GEN:  The patient appears stated age, NAD, but anxious appearing  Neurological examination: Patient is awake, alert, not oriented at this time per daughter's report. No aphasia or dysarthria. Intact fluency (per daughter) and  Reduced comprehension. Remote and recent memory impaired. Able to name and repeat. Cranial nerves: Extraocular movements intact with no nystagmus. No facial asymmetry. Motor: moves all extremities symmetrically, at least anti-gravity x 4.  Unable to perform  finger to nose testing., Gait: narrow-based and steady, able to tandem walk adequately or Negative Romberg test.Unable to cooperate   Assessment and Plan:      Acute mental status changes, rule out infection Patient has shown agitation, decreased urinary output, poor oral intake. He has been attending [facility] program, rule out sick contacts.   Recommendations as follows:     Check UA for UTI as per  Palliative Care team   Check Covid with home test and contact Palliative  Care if positive   Increase Lexapro to 30 mg daily for anxiety    Dementia, moderate with behavioral disturbance   Continue Rivastigmine 4.6 mg  patch daily   Keep November 22 appt with Dr. Delice Lesch        Increase Lexapro to 30 mg daily for anxiety  Call if ay questions or concerns     Follow Up Instructions:    -I discussed the assessment and treatment plan with the patient. The patient was provided an opportunity to ask questions and all were answered. The patient agreed with the plan and demonstrated an understanding of the instructions.   The patient was advised to call back or seek an in-person evaluation if the symptoms worsen or if the condition fails to improve as  anticipated.    Total time spent on today's visit was30 minutes, including both face-to-face time and nonface-to-face time.  Time included that spent on review of records (prior notes available to me/labs/imaging if pertinent), discussing treatment and goals, answering patient's questions and coordinating care.  Sharene Butters, PA-C

## 2020-09-07 NOTE — Telephone Encounter (Signed)
Pt daughter called no answer left a voice mail for her to call the office back

## 2020-09-07 NOTE — Patient Instructions (Addendum)
It was a pleasure to see you in the video visit today  Recommendations are as follows  Increase Lexapro to 30 mg daily, that is 1 and 1/2 tablet  ( instead of Lexapro 20 mg ) . If unable to cut, call us and will prescribe a 10 mg tab for convenience  Check urine for UTI by Palliative Care   Check home test for Covid  Call us if there are any questions or concerns

## 2020-09-14 ENCOUNTER — Other Ambulatory Visit: Payer: Self-pay

## 2020-09-14 ENCOUNTER — Other Ambulatory Visit: Payer: Medicare Other | Admitting: Nurse Practitioner

## 2020-09-14 DIAGNOSIS — R143 Flatulence: Secondary | ICD-10-CM

## 2020-09-14 DIAGNOSIS — K5901 Slow transit constipation: Secondary | ICD-10-CM

## 2020-09-14 DIAGNOSIS — F0391 Unspecified dementia with behavioral disturbance: Secondary | ICD-10-CM

## 2020-09-14 MED ORDER — SIMETHICONE 40 MG/0.6ML PO SUSP: 80.0000 mg | Freq: Every evening | ORAL | Status: DC | PRN

## 2020-09-14 NOTE — Progress Notes (Signed)
[facility] Consult Note Telephone: [phone]  Fax: [phone]    Date of encounter: 09/14/20 PATIENT NAME: [name] [address] [place] [address]   [phone] (home) [phone] (work) DOB: 04-01-1941 MRN: [id number]  PRIMARY CARE PROVIDER:    [name], MD,  [place] [address] [phone]  REFERRING PROVIDER:   [name], MD [place],   [address] [phone]  RESPONSIBLE PARTY:    Contact Information    Name Relation Home Work [place] Spouse [phone]  [phone]   [place], [place] [phone]       I met face to face with patient in home home. His wife and daughter [name] present during visit.  Palliative Care was asked to follow this patient by consultation request of  [name], MD to address advance care planning and complex medical decision making. This is a follow up visit.                                  ASSESSMENT AND PLAN / RECOMMENDATIONS:   Advance Care Planning/Goals of Care:  CODE STATUS: DNR Goal of care: Patient's goal of care is comfort while preserving function. Directives: Directives: Hehas a living will. Has a signed DNR and MOST form in home and on [facility] EMR. Details of MOST form include: Full scope of treatment. Antibiotics if indicated, IVF for a defined trial period, feeding tube for a defined trial period  Symptom Management/Plan: Flatulence: Start Simethicone suspension 82m by mouth daily at bedtime. Encouraged to offer patient smaller meals at regular intervals, encourage adequate fluid intake. Encourage participation in physical activities. Constipation: Start Miralax 17g by mouth daily, if concern for diarrhea may give every other day. Patient refuses pills. Dementia with behavior concern: had recent increase in Lexapro dose from 263mto 3055my mouth daily. Family  report some improvement in behavior, report increase in language use. No report of falls, patient ambulates without assistive device. Patient attends [facility] day program from 10am to 5pm Monday through Fridays. Patient did not attend the program today, family report he refused to get out of bed. Continue supportive care.  Discussion and review of disease trajectory of dementia with family, as it is progressive and terminal and likely to eventually lead to dysphagia, weight loss and immobility. Emotional and supportive care provided. Questions and concerns were addressed. Family was encouraged to call with questions and/or concerns. Provided general support and encouragement.  Follow up Palliative Care Visit: Palliative care will continue to follow for complex medical decision making, advance care planning, and clarification of goals. Return in about 4 weeks or prn.  PPS: 50% down from 60%  HOSPICE ELIGIBILITY/DIAGNOSIS: TBD  CHIEF COMPLAIN: flatulence  History obtained from review of Epic EMR, and discussion with patient's family.  HISTORY OF PRESENT ILLNESS:Ermias V Isharaniis a [age]o.year old malewith multiple medical problems including Dementia(FAST7a), Asthma, CAD, Asthma, GERD, HTN, Mitral valve prolapse, osteoporosis, history of prostate cancer. Family report patient with increased flatulence not relieved with diet change. No report of associated symptoms such as bloating, abdominal pain, nausea or vomiting. Report constipation not relieved with Prune juice. No report of fever, chills, or SOB.  I reviewed available labs, medications, imaging, studies and related documents from the EMR.  Records reviewed and summarized above.   ROS Limited ROS due poor cognition related  to advance dementia  Physical Exam: Current and past weights: no current weight report, no evidence of acute weight loss General: well appearing, NAD CV: RRR, no LE edema Pulmonary: LCTA, no increased work  of breathing, no cough, room air Abdomen: intake fair, normo-active BS + 4 quadrants, soft and non tender, no ascites GU: deferred Skin: warm and dry, no rashes or wounds on visible skin Neuro:  no generalized weakness,  severe cognitive impairment Psych: non-anxious affect, awake and alert Hem/lymph/immuno: no widespread bruising  Past Medical History:  Diagnosis Date  . Asthma   . CAD (coronary artery disease)   . Cancer [facility]) 2016   prostate  . Complication of anesthesia    CO2 high according to patient   . Family history of ovarian cancer   . GERD (gastroesophageal reflux disease)   . History of melanoma   . History of prostate cancer   . HTN (hypertension)   . Mitral valve prolapse   . Osteoporosis   . TIA (transient ischemic attack)   . UTI (lower urinary tract infection)    Thank you for the opportunity to participate in the care of Mr. Wantz.  The palliative care team will continue to follow. Please call our office at [phone] if we can be of additional assistance.   [name], DNP, AGPCNP-BC  COVID-19 PATIENT SCREENING TOOL Asked and negative response unless otherwise noted:   Have you had symptoms of covid, tested positive or been in contact with someone with symptoms/positive test in the past 5-10 days?

## 2020-10-19 ENCOUNTER — Other Ambulatory Visit: Payer: Self-pay

## 2020-10-19 ENCOUNTER — Other Ambulatory Visit: Payer: Medicare Other | Admitting: Nurse Practitioner

## 2020-10-19 DIAGNOSIS — Z515 Encounter for palliative care: Secondary | ICD-10-CM

## 2020-10-19 DIAGNOSIS — F0391 Unspecified dementia with behavioral disturbance: Secondary | ICD-10-CM

## 2020-10-19 DIAGNOSIS — J45909 Unspecified asthma, uncomplicated: Secondary | ICD-10-CM

## 2020-10-19 NOTE — Progress Notes (Signed)
[facility] Consult Note Telephone: [phone]  Fax: [phone]    Date of encounter: 10/19/20 PATIENT NAME: [name] [address] [place] [address]   [phone] (home) [phone] (work) DOB: 25-Apr-1940 MRN: [id number]  PRIMARY CARE PROVIDER:    Lorn [name]  [place] [address] [name] PROVIDER:   Sueanne [name], [place],  [place] [address] [phone]  RESPONSIBLE PARTY:    Contact Information     Name Relation Home Work Mobile   [name] Spouse [phone]  [phone]   [name] [phone]       I met face to face with patient in an [facility] living facility. Palliative Care was asked to follow this patient by consultation request of  Louis Margarita, DO to address advance care planning and complex medical decision making. This is a follow up visit.                                   ASSESSMENT AND PLAN / RECOMMENDATIONS:   Advance Care Planning/Goals of Care:  CODE STATUS: DNR Patient's goal of care is comfort while preserving function. Has a signed DNR and MOST form in home and on [facility] EMR. Details of MOST form include: Full scope of treatment. Antibiotics if indicated, IVF for a defined trial period, feeding tube for a defined trial period. Patient has a living will.   Symptom Management/Plan: Dementia with behavior concerns: Patient currently on Rivastgmine 4.47m patch and Lexapro 230mdaily. Behavior not currently controlled on current regimen. Patient is requesting for physical therapy to improve strength. Recommendation: Consider increasing Rivastigmine dose to 9.66m26maily. Consider referral to Neuropsychiatrist for behavior management. Hold off Physical therapy until behavior improves to ensure compliance. Continue Melatonin for sleep. Asthma: Stable, no report of acute exacerbation. CAD: stable,  no report of chest pain. BP is stable. Continue current plan of care with Losartan, Lipitor, and Aspirin. Provided general support and encouragement. Questions and concerns were addressed. Family was encouraged to call with questions and/or concerns.  Follow up Palliative Care Visit: Palliative care will continue to follow for complex medical decision making, advance care planning, and clarification of goals. Return 6-8 weeks or prn.  PPS: 50%  HOSPICE ELIGIBILITY/DIAGNOSIS: TBD  CHIEF COMPLAIN: Follow up on Dementia  History obtained from review of Epic EMR, discussion with facility staff and interview with family.  HISTORY OF PRESENT ILLNESS:  [name] a 79 41o. year old male with medical problem significant for Dementia with fast score of 7a.  [facility]. Staff report patient has been in respite care for about two weeks. Staff report patient continues to struggle with adjustment to his new environment. Report patient tend to struggle more when his family visits an leaves. Report patient refuses assistance with ADLs and would sometimes refuse meals. Patient noted in bed at the time of the visit, staff report her refused breakfast today, becomes agitated when staff tries to assist him. Of note patient is totally dependent on staff for all of his ADLs, able to feed self. No report of fever, chills, SOB, or uncontrolled pain. Unable to complete ROS with patient due to poor cognition. Patient other medical problem include Asthma, CAD, GERD, HTN, Mitral valve prolapse, Osteoporosis, history of prostate cancer.  I  reviewed available labs, medications, imaging, studies and related documents from the EMR.  Records reviewed and summarized above.   Physical Exam General: well appearing, NAD EYES: anicteric sclera, no discharge  ENMT: intact hearing, oral mucous membranes moist CV: RRR, no LE edema Pulmonary:  LCTA, no increased work of breathing, no cough, room air Abdomen: soft and non tender, no ascites GU: deferred MSK: no sarcopenia, moves all extremities, ambulatory Skin: warm and dry, no rashes or wounds on visible skin Neuro: generalized weakness, severe cognitive impairment Psych: non-anxious affect, A and O x 3 Hem/lymph/immuno: no widespread bruising  Past Medical History:  Diagnosis Date   Asthma    CAD (coronary artery disease)    Cancer ([place]) 2016   prostate   Complication of anesthesia    CO2 high according to patient    Family history of ovarian cancer    GERD (gastroesophageal reflux disease)    History of melanoma    History of prostate cancer    HTN (hypertension)    Mitral valve prolapse    Osteoporosis    TIA (transient ischemic attack)    UTI (lower urinary tract infection)     Thank you for the opportunity to participate in the care of [name].  The palliative care team will continue to follow. Please call our office at [phone] if we can be of additional assistance.   Louis Favre, DNP, AGPCNP-BC  COVID-19 PATIENT SCREENING TOOL Asked and negative response unless otherwise noted:   Have you had symptoms of covid, tested positive or been in contact with someone with symptoms/positive test in the past 5-10 days?

## 2020-11-08 ENCOUNTER — Telehealth: Payer: Self-pay

## 2020-11-08 NOTE — Telephone Encounter (Signed)
(  8:41 am) SW  received a call from patient's daughter advising that patient has been placed a [facility]. She requested that the team continue to follow patient at the facility and keep them updated. SW advised that daughter that the team will follow patient for his regular 4-6 weeks visits. She stated that she would like to the team to update them following visits and make sure his vitals are taken. SW advised her that an order will be obtained to follow patient at the AL, but patient will be followed by the RN/SW team until patient is assigned a regular NP.  Daughter verbalized understanding-no other concerns noted.

## 2020-11-23 ENCOUNTER — Other Ambulatory Visit: Payer: Self-pay

## 2020-11-23 ENCOUNTER — Non-Acute Institutional Stay: Payer: Medicare Other

## 2020-11-23 VITALS — BP 132/64 | HR 88 | Temp 97.0°F | Resp 18 | Wt 166.2 lb

## 2020-11-23 DIAGNOSIS — Z515 Encounter for palliative care: Secondary | ICD-10-CM

## 2020-11-25 NOTE — Progress Notes (Signed)
COMMUNITY PALLIATIVE CARE RN NOTE  PRIMARY CARE PROVIDER:   PLAN OF CARE and INTERVENTION:  ADVANCE CARE PLANNING/GOALS OF CARE: DNR/MOST PATIENT/CAREGIVER EDUCATION: Reinforced fall prevention, safety DISEASE STATUS: Palliative Care visit RN visit completed. Met patient at [facility] Unit. Observed patient during meal time. He requires facility caregivers to assist him with meals. Patient will eat approximately 75% of his meals. Patient resistant to  full assessment.   HISTORY OF PRESENT ILLNESS: This is 80 year old male residing at [facility] with a diagnosis including but not limited to Dementia with behaviors, mitral valve prolapse and CAD  CODE STATUS: DNR MOST FORM: Yes- full scope of treatment- antibiotics as indicated, IV fluids and tube feeding for a defined period of time. PPS: 40%   PHYSICAL EXAM:   Deferred- Patient resistant                    Maxwell Caul, RN,BSN

## 2020-12-13 ENCOUNTER — Telehealth: Payer: Self-pay | Admitting: Neurology

## 2020-12-13 NOTE — Telephone Encounter (Signed)
Pt daughter called back phone clicks and then hangs up

## 2020-12-13 NOTE — Telephone Encounter (Signed)
Pt's daughter called in and left a message with the access nurse. She wants to touch base with Dr. Delice Lesch regarding her father and to see if his care is still being done properly. He is in a home and want to know what the doctor suggests.

## 2020-12-14 NOTE — Telephone Encounter (Signed)
Pt's wife called in wanting to follow up on her daughter's call earlier today.

## 2020-12-15 ENCOUNTER — Telehealth: Payer: Self-pay | Admitting: Neurology

## 2020-12-15 NOTE — Telephone Encounter (Signed)
Spoke with pt daughter Dr Lurlean Horns and informed her Dr Trude Mcburney recommend waiting it out some more, it is a change in his environment and the staff turnover makes it harder. Dr Delice Lesch agrees with their regular family meetings with their staff to discuss his care so they know how to approach him and what to do during times of increased behaviors. Dr Delice Lesch doesn't think changing dose in rivastigmine would do much, agree with staying on same medications. They have a meeting at [place] will call back if the facility wants to make any changes

## 2020-12-15 NOTE — Telephone Encounter (Signed)
See other phone note

## 2020-12-15 NOTE — Telephone Encounter (Signed)
Pt daughter called no answer left a voice mail to call the office back  

## 2020-12-15 NOTE — Telephone Encounter (Signed)
Pt daughter is returning heather's call.

## 2020-12-15 NOTE — Telephone Encounter (Signed)
Pt daughter called voice mail left to call back

## 2020-12-15 NOTE — Telephone Encounter (Signed)
Spoke with pt daughter [name]. [name] is in memory care at [facility] he has been there about 3 months, about every 3rd day he doesn't want to be compliant with the staff in changing cloths or taken a bath.  They are having a lot of staff turn over on 2nd shift they do have private care from 4pm-7pm with one on one care. At times he dose do the activities, she is sleeping well. He is taken miralax daily. He has had decrease in food intake but they have started finger food to see if it helps, asking as long as they get 2 good meals and some ensure in him is that ok as long as no big drop in weight. About a month ago the facility tried to increase in rivastigmine patch they told them NO. They are not ready for [name] to take over his care they want him to keep his own care team at this time, [name] stated that she can get you any information or notes from [facility] that you need. They ar asking for any recommendations or just wait it out to see how he continues to do there. Family has a meeting with facility today because they do not think that some of the staff knows how to approach him and that causes some of his problems,

## 2020-12-15 NOTE — Telephone Encounter (Signed)
I would recommend waiting it out some more, it is a change in his environment and the staff turnover makes it harder. I agree with their regular family meetings with their staff to discuss his care so they know how to approach him and what to do during times of increased behaviors. I don't think changing dose in rivastigmine would do much, agree with staying on same medications. Thanks

## 2020-12-22 ENCOUNTER — Ambulatory Visit: Payer: Medicare Other | Admitting: Podiatry

## 2021-01-30 ENCOUNTER — Encounter: Payer: Self-pay | Admitting: Genetic Counselor

## 2021-02-14 ENCOUNTER — Encounter: Payer: Self-pay | Admitting: Neurology

## 2021-02-14 ENCOUNTER — Other Ambulatory Visit: Payer: Self-pay

## 2021-02-14 ENCOUNTER — Telehealth (INDEPENDENT_AMBULATORY_CARE_PROVIDER_SITE_OTHER): Payer: Medicare Other | Admitting: Neurology

## 2021-02-14 VITALS — Ht 69.0 in | Wt 169.0 lb

## 2021-02-14 DIAGNOSIS — F03B18 Unspecified dementia, moderate, with other behavioral disturbance: Secondary | ICD-10-CM | POA: Diagnosis not present

## 2021-02-14 MED ORDER — ESCITALOPRAM OXALATE 20 MG PO TABS: 20.0000 mg | ORAL_TABLET | Freq: Every day | ORAL | 3 refills | Status: DC

## 2021-02-14 MED ORDER — RIVASTIGMINE 4.6 MG/24HR TD PT24: 4.6000 mg | MEDICATED_PATCH | Freq: Every day | TRANSDERMAL | 11 refills | Status: DC

## 2021-02-14 NOTE — Progress Notes (Signed)
Virtual Visit via Video Note The purpose of this virtual visit is to provide medical care while limiting exposure to the novel coronavirus.    Consent from POA was obtained for video visit:  Yes.   Answered questions that patient had about telehealth interaction:  Yes.   I discussed the limitations, risks, security and privacy concerns of performing an evaluation and management service by telemedicine. I also discussed with the patient that there may be a patient responsible charge related to this service. The patient expressed understanding and agreed to proceed.  Pt location: Home Physician Location: office Name of referring provider:  [name], MD I connected with [name] at patients initiation/request on 02/14/2021 at  3:00 PM EDT by video enabled telemedicine application and verified that I am speaking with the correct person using two identifiers. Pt MRN:  [id number] Pt DOB:  1940-11-30 Video Participants:  [name];  [name] (spouse)   History of Present Illness:  The patient had a virtual video visit on 02/14/2021. He was last evaluated in the Neurology clinic 6 months ago for moderate to severe Alzheimer's disease. He is now in memory care at [facility] and has been pretty stable now that he is more familiar with staff and environment. He let's staff/caregivers dress and bathe him without issues. His caregiver gets him on the commode, takes him for a ride/walk, massages his legs. He walks around but does not partake in the facility activities. He is non-verbal for today's visit, smiles and waves with his left hand but has difficulty following commands. He is able to feed himself, no difficulty swallowing. No falls. [name] thinks he is sleeping well. His has been discharged from PT and OT because he could not understand their instructions. He is on Rivastigmine 4.6mg  patch/24 hr and Lexapro 20mg  daily without side effects.   History on Initial Assessment  11/23/2018: This is a 80 year old left-handed man with a history of hypertension, prostate cancer, presenting to establish care for dementia. Family reports memory changes started 3-4 years ago where he would not remember names. Later on he did not recognize friends. His wife reports taking over finances in 2019, it appears he still did the taxes in 2019 (despite being diagnosed with dementia in 2018). He has been evaluated by neurologist Dr. Brett Fairy in 2018.  [place] 23/30 in March 2018, 17/30 in April 2019. He stopped driving a year ago when he would get lost or go past their house. He started going to the [place] clinic in October 2019. He refused to do [facility] testing but was noted to have cognitive deficits in multiple cognitive domains, with affective and behavioral components of delusions, visual hallucinations, irritability. No gait or sleep component. He had side effects on Donepezil and would miss doses of Rivastigmine. He was switched to the transdermal patch 4.6mg  daily. He had a paraneoplastic panel done which was negative. His last visit at [facility] was 2 months ago, he has seen Psychiatry and done well with addition of Lexapro for anxiety. It was noted that dose of rivastigmine was not increased due to syncopal episode. Diagnosis of late onset dementia,probable Alzheimer's disease, cognitive deficits are moderate to severe.   Family initially felt that the rivastigmine patch was "overacting," so they tried administering the patch every other day. They noticed worsening and put him back on daily patches. He remembers things better and helps around the house. He can be stubborn and very demanding, wanting to get  dressed and shaved early in the morning. Wife reports he is very concerned and worried, he keeps repeating shaving because it is not as good as he wants. He For the past few months, he has been rummaging a lot in the house. He does not realized ownership of things and would pick up things  that are not his and start using it. He gets confused using his electric shaver. They report sleep and appetite are good. Family reports he craves sweets. Family denies any hallucinations since starting the patches. He does not read as much. They deny any paranoia, most of the time he is calm, once in a while he gets a burst and gets mad or upset. He was more emotional when they visited [place] 1.5 years ago, family has not noticed this recently but do wonder about depression.   He denies any headaches, dizziness, vision changes, dysarthria/dysphagia, neck/back pain, focal numbness/tingling/weakness, bowel/bladder dysfunction, anosmia, or tremors. He denies any falls then his wife reminds him she fell and he states he was carrying a lot of things then. He states he "remembers everything."   He had an MRI brain in 03/2018 which I personally reviewed showing moderate diffuse atrophy and mild chronic microvascular disease.   Current Outpatient Medications on File Prior to Visit  Medication Sig Dispense Refill   Ascorbic Acid (VITAMIN C) 1000 MG tablet Take 500 mg by mouth daily.     aspirin 81 MG tablet Take 81 mg by mouth daily.     atorvastatin (LIPITOR) 10 MG tablet TAKE 1 TABLET BY MOUTH  DAILY 90 tablet 3   calcium carbonate (TUMS - DOSED IN MG ELEMENTAL CALCIUM) 500 MG chewable tablet Chew 1 tablet by mouth daily.     Cholecalciferol (VITAJOY DAILY D GUMMIES) 25 MCG (1000 UT) CHEW Chew 2,000 Units by mouth.     escitalopram (LEXAPRO) 20 MG tablet Take 1 tablet (20 mg total) by mouth daily. 90 tablet 3   Ketotifen Fumarate (REFRESH EYE ITCH RELIEF OP) Apply to eye.     losartan (COZAAR) 50 MG tablet Take 50 mg by mouth daily.     Melatonin-Pyridoxine (MELATIN PO) Take 3 mg by mouth. Takes at 6pm     Multiple Vitamins-Minerals (EQ MULTIVITAMINS ADULT GUMMY PO) Take by mouth daily.     NON FORMULARY Take by mouth 3 (three) times daily. CBD oil BID     polyethylene glycol powder (GLYCOLAX/MIRALAX) 17  GM/SCOOP powder Take 17 g by mouth daily.     rivastigmine (EXELON) 4.6 mg/24hr Place 1 patch (4.6 mg total) onto the skin daily. 30 patch 11   Current Facility-Administered Medications on File Prior to Visit  Medication Dose Route Frequency Provider Last Rate Last Admin   simethicone (MYLICON) 40 PJ/0.9TO suspension 80 mg  80 mg Oral QHS PRN Mbemena, Arnoldo Morale, NP         Observations/Objective:   Vitals:   02/14/21 1420  Weight: 169 lb (76.7 kg)  Height: 5\' 9"  (1.753 m)   GEN:  The patient appears stated age and is in NAD.  Neurological examination: Patient is awake, alert. No verbal output during today's visit, but wife notes sometimes he speaks in [place] or [place]. He smiles and waves but does not follow commands. Wife repeatedly asked him to lift his arms. Extraocular movements intact with no nystagmus. No facial asymmetry. Motor: moves all extremities symmetrically, at least anti-gravity x 4.    Assessment and Plan:   This is a 80 yo RH  man with a history of hypertension, prostate cancer, with moderate to severe dementia, likely due to Alzheimer's disease. He is now in memory care requiring assistance with all ADLs. He does not follow commands and is non-verbal on today's visit but wife notes he speaks in [place] sometimes. Continue Rivastigmine and Lexapro, continue close supervision. Family requesting a 3 month follow-up, he will follow-up with Memory Disorders PA Sharene Butters, they know to call for any changes.    Follow Up Instructions:    -I discussed the assessment and treatment plan with the patient. The patient was provided an opportunity to ask questions and all were answered. The patient agreed with the plan and demonstrated an understanding of the instructions.   The patient was advised to call back or seek an in-person evaluation if the symptoms worsen or if the condition fails to improve as anticipated.    Cameron Sprang, MD

## 2021-02-14 NOTE — Patient Instructions (Signed)
Good to see both of you! Continue medications and plan of care. Follow-up with Memory Disorders PA Sharene Butters in 3 months, call for any changes

## 2021-02-20 ENCOUNTER — Non-Acute Institutional Stay: Payer: Medicare Other

## 2021-02-20 VITALS — BP 128/82 | HR 84 | Resp 18 | Wt 169.0 lb

## 2021-02-20 DIAGNOSIS — Z515 Encounter for palliative care: Secondary | ICD-10-CM

## 2021-02-21 ENCOUNTER — Other Ambulatory Visit: Payer: Self-pay

## 2021-02-25 NOTE — Progress Notes (Signed)
PATIENT NAME: [name] DOB: 04/13/1941 MRN: [id number]  PRIMARY CARE PROVIDER: Sueanne Margarita, DO  RESPONSIBLE PARTY:  Acct ID - Guarantor Home Phone Work Phone Relationship Acct Type  [id number] Louis [phone] Self P/F     [place], [place],  [address]   COMMUNITY PALLIATIVE CARE RN NOTE    PLAN OF CARE and INTERVENTION:  ADVANCE CARE PLANNING/GOALS OF CARE: Comfort, Safety PATIENT/CAREGIVER EDUCATION: Education regarding Palliative care services DISEASE STATUS:  RN Palliative care visit completed today at the memory care at [facility]. Patient out of bed, well-groomed and dressed appropriately. Spoke with [name], [place] at facility, who shared that patient has been more receptive to assistance from facility staff. He will now feed himself at times with out requiring promptings. Patient is ambulatory around the unit. No recent falls reported. Recent video visit with neurologist completed.    HISTORY OF PRESENT ILLNESS: This is a 80 year old male with dx including but not limited to CAD, Mitral Valve prolapse, and dementia. Palliative care has been asked to follow for additional support, goals of care and complex decision making.  CODE STATUS: DNR/MOST PPS: 50%    PHYSICAL EXAM: Limited due to patient not allowing  Lungs: No audible congestion, No cough  Cardiovascular: no evidence of chest pain  Skin: Warm, Dry, no visible wounds or rashes Neurological: progressive cognitive decline        [name], BSN

## 2021-03-01 ENCOUNTER — Non-Acute Institutional Stay: Payer: Medicare Other

## 2021-03-01 ENCOUNTER — Telehealth: Payer: Self-pay

## 2021-03-01 DIAGNOSIS — Z515 Encounter for palliative care: Secondary | ICD-10-CM

## 2021-03-01 NOTE — Telephone Encounter (Signed)
Received phone call from patient's daughter who shared that left foot has pitting edema. Facility staff elevated it on pillows last evening. Daughter updated PCP yesterday and wanted to update Palliative care. Will reach out to Michael at facility and reach out to Palliative care team to schedule follow up visit to evaluate patient.

## 2021-03-02 ENCOUNTER — Other Ambulatory Visit: Payer: Self-pay

## 2021-03-02 NOTE — Progress Notes (Signed)
PATIENT NAME: [name] DOB: Nov 22, 1940 MRN: [id number]  PRIMARY CARE PROVIDER: Sueanne Margarita, DO  RESPONSIBLE PARTY:  Acct ID - Guarantor Home Phone Work Phone Relationship Acct Type  [id number] Seth [phone] Self P/F     [place], [place], [place] [address]     PRN RN Visit made today at request of patient's daughter, who expressed concerns over new edema to left lower extremity. Upon assessment today, patient sitting on top of bed with feet on the floor. Both feet are cool to touch and have non pitting edema. Patient resistant to have feet elevated on bed and motioned for this RN to stop touching his feet. Patient was able to state  "a little" when asked if feet were painful. Slight discoloration also noted to feet bilaterally. Daughter updated on visit and discussed use of TED stockings. Daughter in agreement. [name], [place] at facility, updated. Dr. Mariea Clonts updated and provided orders.         [name], RN

## 2021-03-13 ENCOUNTER — Other Ambulatory Visit: Payer: Self-pay

## 2021-03-13 ENCOUNTER — Encounter (HOSPITAL_COMMUNITY): Payer: Self-pay

## 2021-03-13 ENCOUNTER — Emergency Department (HOSPITAL_COMMUNITY): Payer: Medicare Other

## 2021-03-13 ENCOUNTER — Inpatient Hospital Stay (HOSPITAL_COMMUNITY): Payer: Medicare Other

## 2021-03-13 ENCOUNTER — Inpatient Hospital Stay (HOSPITAL_COMMUNITY)
Admission: EM | Admit: 2021-03-13 | Discharge: 2021-03-22 | DRG: 871 | Disposition: A | Discharge status: Hospice | Payer: Medicare Other | Source: Skilled Nursing Facility | Attending: Internal Medicine | Admitting: Internal Medicine

## 2021-03-13 DIAGNOSIS — Z9889 Other specified postprocedural states: Secondary | ICD-10-CM

## 2021-03-13 DIAGNOSIS — L0390 Cellulitis, unspecified: Secondary | ICD-10-CM

## 2021-03-13 DIAGNOSIS — I10 Essential (primary) hypertension: Secondary | ICD-10-CM | POA: Diagnosis present

## 2021-03-13 DIAGNOSIS — Z8673 Personal history of transient ischemic attack (TIA), and cerebral infarction without residual deficits: Secondary | ICD-10-CM | POA: Diagnosis not present

## 2021-03-13 DIAGNOSIS — E785 Hyperlipidemia, unspecified: Secondary | ICD-10-CM | POA: Diagnosis present

## 2021-03-13 DIAGNOSIS — Z952 Presence of prosthetic heart valve: Secondary | ICD-10-CM

## 2021-03-13 DIAGNOSIS — R6510 Systemic inflammatory response syndrome (SIRS) of non-infectious origin without acute organ dysfunction: Secondary | ICD-10-CM | POA: Diagnosis not present

## 2021-03-13 DIAGNOSIS — Z85820 Personal history of malignant melanoma of skin: Secondary | ICD-10-CM

## 2021-03-13 DIAGNOSIS — B9789 Other viral agents as the cause of diseases classified elsewhere: Secondary | ICD-10-CM | POA: Diagnosis present

## 2021-03-13 DIAGNOSIS — G9341 Metabolic encephalopathy: Secondary | ICD-10-CM | POA: Diagnosis present

## 2021-03-13 DIAGNOSIS — E860 Dehydration: Secondary | ICD-10-CM | POA: Diagnosis present

## 2021-03-13 DIAGNOSIS — J45909 Unspecified asthma, uncomplicated: Secondary | ICD-10-CM | POA: Diagnosis present

## 2021-03-13 DIAGNOSIS — R6520 Severe sepsis without septic shock: Secondary | ICD-10-CM | POA: Diagnosis present

## 2021-03-13 DIAGNOSIS — A4189 Other specified sepsis: Principal | ICD-10-CM | POA: Diagnosis present

## 2021-03-13 DIAGNOSIS — F03B18 Unspecified dementia, moderate, with other behavioral disturbance: Secondary | ICD-10-CM | POA: Diagnosis present

## 2021-03-13 DIAGNOSIS — Z20822 Contact with and (suspected) exposure to covid-19: Secondary | ICD-10-CM | POA: Diagnosis present

## 2021-03-13 DIAGNOSIS — Z8546 Personal history of malignant neoplasm of prostate: Secondary | ICD-10-CM

## 2021-03-13 DIAGNOSIS — Z66 Do not resuscitate: Secondary | ICD-10-CM | POA: Diagnosis present

## 2021-03-13 DIAGNOSIS — J4540 Moderate persistent asthma, uncomplicated: Secondary | ICD-10-CM | POA: Diagnosis present

## 2021-03-13 DIAGNOSIS — M810 Age-related osteoporosis without current pathological fracture: Secondary | ICD-10-CM | POA: Diagnosis present

## 2021-03-13 DIAGNOSIS — I2510 Atherosclerotic heart disease of native coronary artery without angina pectoris: Secondary | ICD-10-CM | POA: Diagnosis present

## 2021-03-13 DIAGNOSIS — J449 Chronic obstructive pulmonary disease, unspecified: Secondary | ICD-10-CM | POA: Diagnosis present

## 2021-03-13 DIAGNOSIS — Z515 Encounter for palliative care: Secondary | ICD-10-CM

## 2021-03-13 DIAGNOSIS — L03115 Cellulitis of right lower limb: Secondary | ICD-10-CM | POA: Diagnosis present

## 2021-03-13 DIAGNOSIS — C61 Malignant neoplasm of prostate: Secondary | ICD-10-CM | POA: Diagnosis not present

## 2021-03-13 DIAGNOSIS — F05 Delirium due to known physiological condition: Secondary | ICD-10-CM | POA: Diagnosis present

## 2021-03-13 DIAGNOSIS — W19XXXA Unspecified fall, initial encounter: Secondary | ICD-10-CM | POA: Diagnosis present

## 2021-03-13 DIAGNOSIS — Z955 Presence of coronary angioplasty implant and graft: Secondary | ICD-10-CM

## 2021-03-13 DIAGNOSIS — Z7189 Other specified counseling: Secondary | ICD-10-CM

## 2021-03-13 DIAGNOSIS — R1310 Dysphagia, unspecified: Secondary | ICD-10-CM | POA: Diagnosis present

## 2021-03-13 DIAGNOSIS — K5900 Constipation, unspecified: Secondary | ICD-10-CM | POA: Diagnosis present

## 2021-03-13 DIAGNOSIS — F03B2 Unspecified dementia, moderate, with psychotic disturbance: Secondary | ICD-10-CM | POA: Diagnosis present

## 2021-03-13 DIAGNOSIS — M25559 Pain in unspecified hip: Secondary | ICD-10-CM | POA: Diagnosis not present

## 2021-03-13 DIAGNOSIS — R0603 Acute respiratory distress: Secondary | ICD-10-CM | POA: Diagnosis present

## 2021-03-13 DIAGNOSIS — G253 Myoclonus: Secondary | ICD-10-CM | POA: Diagnosis present

## 2021-03-13 DIAGNOSIS — A419 Sepsis, unspecified organism: Secondary | ICD-10-CM | POA: Diagnosis not present

## 2021-03-13 DIAGNOSIS — K219 Gastro-esophageal reflux disease without esophagitis: Secondary | ICD-10-CM

## 2021-03-13 DIAGNOSIS — E639 Nutritional deficiency, unspecified: Secondary | ICD-10-CM | POA: Diagnosis present

## 2021-03-13 DIAGNOSIS — M25551 Pain in right hip: Secondary | ICD-10-CM | POA: Diagnosis not present

## 2021-03-13 DIAGNOSIS — Z8249 Family history of ischemic heart disease and other diseases of the circulatory system: Secondary | ICD-10-CM

## 2021-03-13 DIAGNOSIS — R0602 Shortness of breath: Secondary | ICD-10-CM

## 2021-03-13 DIAGNOSIS — Z8041 Family history of malignant neoplasm of ovary: Secondary | ICD-10-CM

## 2021-03-13 DIAGNOSIS — R143 Flatulence: Secondary | ICD-10-CM

## 2021-03-13 HISTORY — DX: Sepsis, unspecified organism: R65.20

## 2021-03-13 HISTORY — DX: Sepsis, unspecified organism: A41.9

## 2021-03-13 LAB — DIFFERENTIAL
Abs Immature Granulocytes: 0.03 10*3/uL (ref 0.00–0.07)
Basophils Absolute: 0 10*3/uL (ref 0.0–0.1)
Basophils Relative: 0 %
Eosinophils Absolute: 0.1 10*3/uL (ref 0.0–0.5)
Eosinophils Relative: 1 %
Immature Granulocytes: 0 %
Lymphocytes Relative: 8 %
Lymphs Abs: 0.7 10*3/uL (ref 0.7–4.0)
Monocytes Absolute: 1 10*3/uL (ref 0.1–1.0)
Monocytes Relative: 11 %
Neutro Abs: 7.3 10*3/uL (ref 1.7–7.7)
Neutrophils Relative %: 80 %

## 2021-03-13 LAB — CBC WITH DIFFERENTIAL/PLATELET
Abs Immature Granulocytes: 0.04 10*3/uL (ref 0.00–0.07)
Basophils Absolute: 0.1 10*3/uL (ref 0.0–0.1)
Basophils Relative: 1 %
Eosinophils Absolute: 0.1 10*3/uL (ref 0.0–0.5)
Eosinophils Relative: 1 %
HCT: 49.8 % (ref 39.0–52.0)
Hemoglobin: 16.3 g/dL (ref 13.0–17.0)
Immature Granulocytes: 0 %
Lymphocytes Relative: 11 %
Lymphs Abs: 1.4 10*3/uL (ref 0.7–4.0)
MCH: 31.6 pg (ref 26.0–34.0)
MCHC: 32.7 g/dL (ref 30.0–36.0)
MCV: 96.5 fL (ref 80.0–100.0)
Monocytes Absolute: 1.3 10*3/uL — ABNORMAL HIGH (ref 0.1–1.0)
Monocytes Relative: 10 %
Neutro Abs: 9.8 10*3/uL — ABNORMAL HIGH (ref 1.7–7.7)
Neutrophils Relative %: 77 %
Platelets: 181 10*3/uL (ref 150–400)
RBC: 5.16 MIL/uL (ref 4.22–5.81)
RDW: 13.7 % (ref 11.5–15.5)
WBC: 12.7 10*3/uL — ABNORMAL HIGH (ref 4.0–10.5)
nRBC: 0 % (ref 0.0–0.2)

## 2021-03-13 LAB — URINALYSIS, MICROSCOPIC (REFLEX): Bacteria, UA: NONE SEEN

## 2021-03-13 LAB — HEPATIC FUNCTION PANEL
ALT: 20 U/L (ref 0–44)
AST: 31 U/L (ref 15–41)
Albumin: 3.7 g/dL (ref 3.5–5.0)
Alkaline Phosphatase: 67 U/L (ref 38–126)
Bilirubin, Direct: 0.3 mg/dL — ABNORMAL HIGH (ref 0.0–0.2)
Indirect Bilirubin: 0.9 mg/dL (ref 0.3–0.9)
Total Bilirubin: 1.2 mg/dL (ref 0.3–1.2)
Total Protein: 6.4 g/dL — ABNORMAL LOW (ref 6.5–8.1)

## 2021-03-13 LAB — COMPREHENSIVE METABOLIC PANEL
ALT: 26 U/L (ref 0–44)
AST: 38 U/L (ref 15–41)
Albumin: 4.8 g/dL (ref 3.5–5.0)
Alkaline Phosphatase: 90 U/L (ref 38–126)
Anion gap: 11 (ref 5–15)
BUN: 25 mg/dL — ABNORMAL HIGH (ref 8–23)
CO2: 29 mmol/L (ref 22–32)
Calcium: 9.9 mg/dL (ref 8.9–10.3)
Chloride: 102 mmol/L (ref 98–111)
Creatinine, Ser: 1.02 mg/dL (ref 0.61–1.24)
GFR, Estimated: >60 mL/min (ref >60)
Glucose, Bld: 112 mg/dL — ABNORMAL HIGH (ref 70–99)
Potassium: 3.6 mmol/L (ref 3.5–5.1)
Sodium: 142 mmol/L (ref 135–145)
Total Bilirubin: 0.9 mg/dL (ref 0.3–1.2)
Total Protein: 8.3 g/dL — ABNORMAL HIGH (ref 6.5–8.1)

## 2021-03-13 LAB — URINALYSIS, ROUTINE W REFLEX MICROSCOPIC
Bilirubin Urine: NEGATIVE
Glucose, UA: NEGATIVE mg/dL
Ketones, ur: 40 mg/dL — AB
Leukocytes,Ua: NEGATIVE
Nitrite: NEGATIVE
Protein, ur: 100 mg/dL — AB
Specific Gravity, Urine: >1.03 — ABNORMAL HIGH (ref 1.005–1.030)
pH: 6 (ref 5.0–8.0)

## 2021-03-13 LAB — PROTIME-INR
INR: 1.1 (ref 0.8–1.2)
Prothrombin Time: 14.1 seconds (ref 11.4–15.2)

## 2021-03-13 LAB — PHOSPHORUS: Phosphorus: 2.1 mg/dL — ABNORMAL LOW (ref 2.5–4.6)

## 2021-03-13 LAB — RESP PANEL BY RT-PCR (FLU A&B, COVID) ARPGX2
Influenza A by PCR: NEGATIVE
Influenza B by PCR: NEGATIVE
SARS Coronavirus 2 by RT PCR: NEGATIVE

## 2021-03-13 LAB — APTT: aPTT: 33 seconds (ref 24–36)

## 2021-03-13 LAB — LACTIC ACID, PLASMA
Lactic Acid, Venous: 3.6 mmol/L (ref 0.5–1.9)
Lactic Acid, Venous: 1.4 mmol/L (ref 0.5–1.9)

## 2021-03-13 LAB — BRAIN NATRIURETIC PEPTIDE: B Natriuretic Peptide: 155.1 pg/mL — ABNORMAL HIGH (ref 0.0–100.0)

## 2021-03-13 LAB — MAGNESIUM: Magnesium: 1.5 mg/dL — ABNORMAL LOW (ref 1.7–2.4)

## 2021-03-13 LAB — CK: Total CK: 257 U/L (ref 49–397)

## 2021-03-13 LAB — AMMONIA: Ammonia: 16 umol/L (ref 9–35)

## 2021-03-13 MED ORDER — SODIUM CHLORIDE 0.9 % IV SOLN
2.0000 g | Freq: Once | INTRAVENOUS | Status: AC
  Administered 2021-03-13: 2 g via INTRAVENOUS

## 2021-03-13 MED ORDER — METRONIDAZOLE 500 MG/100ML IV SOLN
500.0000 mg | Freq: Two times a day (BID) | INTRAVENOUS | Status: DC
  Administered 2021-03-14 – 2021-03-15 (×3): 500 mg via INTRAVENOUS
  Filled 2021-03-13 (×3): qty 100

## 2021-03-13 MED ORDER — LACTATED RINGERS IV BOLUS (SEPSIS)
1000.0000 mL | Freq: Once | INTRAVENOUS | Status: AC
  Administered 2021-03-13: 1000 mL via INTRAVENOUS

## 2021-03-13 MED ORDER — VANCOMYCIN HCL IN DEXTROSE 1-5 GM/200ML-% IV SOLN: 1000.0000 mg | Freq: Once | INTRAVENOUS | Status: DC

## 2021-03-13 MED ORDER — ACETAMINOPHEN 650 MG RE SUPP
650.0000 mg | Freq: Once | RECTAL | Status: AC
  Administered 2021-03-13: 650 mg via RECTAL

## 2021-03-13 MED ORDER — ACETAMINOPHEN 325 MG PO TABS: 650.0000 mg | ORAL_TABLET | Freq: Once | ORAL | Status: DC

## 2021-03-13 MED ORDER — LACTATED RINGERS IV SOLN: INTRAVENOUS | Status: DC

## 2021-03-13 MED ORDER — LACTATED RINGERS IV BOLUS (SEPSIS)
500.0000 mL | Freq: Once | INTRAVENOUS | Status: AC
  Administered 2021-03-13: 500 mL via INTRAVENOUS

## 2021-03-13 MED ORDER — VANCOMYCIN HCL 1250 MG/250ML IV SOLN
1250.0000 mg | INTRAVENOUS | Status: DC
  Administered 2021-03-14: 1250 mg via INTRAVENOUS
  Filled 2021-03-13: qty 250

## 2021-03-13 MED ORDER — VANCOMYCIN HCL 1500 MG/300ML IV SOLN
1500.0000 mg | Freq: Once | INTRAVENOUS | Status: AC
  Administered 2021-03-13: 1500 mg via INTRAVENOUS
  Filled 2021-03-13: qty 300

## 2021-03-13 MED ORDER — METRONIDAZOLE 500 MG/100ML IV SOLN
500.0000 mg | Freq: Once | INTRAVENOUS | Status: AC
  Administered 2021-03-13: 500 mg via INTRAVENOUS

## 2021-03-13 MED ORDER — SODIUM CHLORIDE 0.9 % IV SOLN
2.0000 g | Freq: Two times a day (BID) | INTRAVENOUS | Status: DC
  Administered 2021-03-14: 2 g via INTRAVENOUS
  Filled 2021-03-13 (×2): qty 2

## 2021-03-13 NOTE — Sepsis Progress Note (Signed)
Following per sepsis protocol   

## 2021-03-13 NOTE — Progress Notes (Signed)
Pharmacy Antibiotic Note  [name] is a 80 y.o. male admitted on 03/13/2021 with medical history significant of  Asthma, CAD, prostate cancer, GERD, HTN, TIA  , pt presented with increased agitation and fever.  Pharmacy has been consulted to dose vancomycin and cefepime for cellulitis.  1st doses given in ED  Plan: Vancomycin 1250mg  IV q24h (AUC 455.2, Scr 1.02) Cefepime 2gm IV q12h Flagyl per MD Follow renal function and clinical course  Height: 5\' 8"  (172.7 cm) Weight: 75.1 kg (165 lb 9.1 oz) IBW/kg (Calculated) : 68.4  Temp (24hrs), Avg:99.5 F (37.5 C), Min:98.2 F (36.8 C), Max:101.4 F (38.6 C)  Recent Labs  Lab 03/13/21 1715  WBC 12.7*  CREATININE 1.02  LATICACIDVEN 3.6*    Estimated Creatinine Clearance: 55.9 mL/min (by C-G formula based on SCr of 1.02 mg/dL).    Allergies  Allergen Reactions   Aricept [Donepezil]     Upset stomach   Depakote [Divalproex Sodium]     Made me feel like a different person   Penicillins Itching and Rash    Has patient had a PCN reaction causing immediate rash, facial/tongue/throat swelling, SOB or lightheadedness with hypotension: YES Has patient had a PCN reaction causing severe rash involving mucus membranes or skin necrosis: NO Has patient had a PCN reaction that required hospitalization: NO Has patient had a PCN reaction occurring within the last 10 years: NO If all of the above answers are "NO", then may proceed with Cephalosporin use.    Antimicrobials this admission: 11/29 vanc >> 11/29 cefepime >> 11/29 flagyl >>  Dose adjustments this admission:   Microbiology results: 11/29 BCx:  11/29 UCx:   Thank you for allowing pharmacy to be a part of this patient's care.  Dolly Rias RPh 03/13/2021, 10:50 PM

## 2021-03-13 NOTE — H&P (Signed)
[name] LHT:[id number] DOB: 1940-11-26 DOA: 03/13/2021     PCP: Sueanne Margarita, DO   Outpatient Specialists:    NEurology   Dr. Ellouise Newer    Cardiology Dr. Einar Gip Patient arrived to ER on 03/13/21 at 1459 Referred by Attending Lacretia Leigh, MD   Patient coming from:   From facility [name]   Chief Complaint:   Chief Complaint  Patient presents with   Altered Mental Status   Agitation    HPI: [name] is a 80 y.o. male with medical history significant of  Asthma, CAD, prostate cancer, GERD, HTN, TIA status post mitral valve repair    Presented with  increased agitation. fever Ttripped an fall today, there was unsure if he did hit his head or not. Patient appears to be more confused than at baseline At baseline he is able to ambulate.  He does not recognize his family members anymore. Has had some decreased p.o. intake recently.  Pt appears uncomfortable screaming out any time he is moved   Has  been vaccinated against COVID  and boosted, had flu shot   Initial COVID TEST  NEGATIVE   Lab Results  Component Value Date   [place] NEGATIVE 03/13/2021     Regarding pertinent Chronic problems:     Hyperlipidemia -  on statins Lipitor Lipid Panel     Component Value Date/Time   CHOL  02/21/2010 0008    174        ATP III CLASSIFICATION:  <200     mg/dL   Desirable  200-239  mg/dL   Borderline High  >=240    mg/dL   High          TRIG 166 (H) 02/21/2010 0008   HDL 36 (L) 02/21/2010 0008   CHOLHDL 4.8 02/21/2010 0008   VLDL 33 02/21/2010 0008   LDLCALC (H) 02/21/2010 0008    105        Total Cholesterol/HDL:CHD Risk Coronary Heart Disease Risk Table                     Men   Women  1/2 Average Risk   3.4   3.3  Average Risk       5.0   4.4  2 X Average Risk   9.6   7.1  3 X Average Risk  23.4   11.0        Use the calculated Patient Ratio above and the CHD Risk Table to determine the patient's CHD Risk.        ATP  III CLASSIFICATION (LDL):  <100     mg/dL   Optimal  100-129  mg/dL   Near or Above                    Optimal  130-159  mg/dL   Borderline  160-189  mg/dL   High  >190     mg/dL   Very High    HTN on losartan Mitral Valve repair Diastolic function not assessed due to mitral valve  repair status. Calculated EF 69%.    Asthma -well   controlled on home inhalers    Hx of TIA-  with/out residual deficits on Aspirin 81 mg,   Hx of CAD - S/P PTCA and stenting of RCA, normal other vessels by cath    Dementia - on  EXELON patch    While in ER: Lactic acid 3.6 Febrile but unclear source  CT abdomen done was unremarkable chest x-ray unremarkable UA unremarkable.  Pelvis showed possible right femoral neck fracture Designated hip film did not confirm it. CT abdomen showed evidence of constipation  ED Triage Vitals  Enc Vitals Group     BP 03/13/21 1514 (!) 150/85     Pulse Rate 03/13/21 1514 97     Resp 03/13/21 1514 (!) 21     Temp 03/13/21 1514 99 F (37.2 C)     Temp Source 03/13/21 1514 Axillary     SpO2 03/13/21 1514 96 %     Weight 03/13/21 1514 169 lb 12.1 oz (77 kg)     Height 03/13/21 1514 _0  (1.727 m)     Head Circumference --      Peak Flow --      Pain Score --      Pain Loc --      Pain Edu? --      Excl. in [place]? --   TMAX(24)@     _________________________________________ Significant initial  Findings: Abnormal Labs Reviewed  LACTIC ACID, PLASMA - Abnormal; Notable for the following components:      Result Value   Lactic Acid, Venous 3.6 (*)    All other components within normal limits  COMPREHENSIVE METABOLIC PANEL - Abnormal; Notable for the following components:   Glucose, Bld 112 (*)    BUN 25 (*)    Total Protein 8.3 (*)    All other components within normal limits  CBC WITH DIFFERENTIAL/PLATELET - Abnormal; Notable for the following components:   WBC 12.7 (*)    Neutro Abs 9.8 (*)    Monocytes Absolute 1.3 (*)    All other components within  normal limits  URINALYSIS, ROUTINE W REFLEX MICROSCOPIC - Abnormal; Notable for the following components:   Specific Gravity, Urine >1.030 (*)    Hgb urine dipstick TRACE (*)    Ketones, ur 40 (*)    Protein, ur 100 (*)    All other components within normal limits  URINALYSIS, MICROSCOPIC (REFLEX) - Abnormal; Notable for the following components:   Crystals AMMONIUM BIURATE CRYSTALS PRESENT (*)    All other components within normal limits   ____________________________________________ Ordered CT HEAD   NON acute  CXR -  NON acute  CTabd/pelvis -  non acute, obstipation   ECG: Ordered  ____________________ This patient meets SIRS Criteria and may be septic.    The recent clinical data is shown below. Vitals:   03/13/21 1744 03/13/21 1800 03/13/21 1900 03/13/21 2004  BP:  (!) 150/115 (!) 155/133 (!) 148/86  Pulse: (!) 110 (!) 110 (!) 104 (!) 114  Resp: (!) 29 (!) 29 (!) 22 (!) 26  Temp:      TempSrc:      SpO2: 94% 96% 98% 93%  Weight:      Height:        WBC     Component Value Date/Time   WBC 12.7 (H) 03/13/2021 1715   LYMPHSABS 1.4 03/13/2021 1715   LYMPHSABS 1.3 01/02/2017 1125   MONOABS 1.3 (H) 03/13/2021 1715   MONOABS 0.8 01/02/2017 1125   EOSABS 0.1 03/13/2021 1715   EOSABS 0.2 01/02/2017 1125   BASOSABS 0.1 03/13/2021 1715   BASOSABS 0.1 01/02/2017 1125     Lactic Acid, Venous    Component Value Date/Time   LATICACIDVEN 3.6 (HH) 03/13/2021 1715    Procalcitonin ,0.1 Lactic Acid, Venous    Component Value Date/Time   LATICACIDVEN 1.4 03/13/2021 2314  UA no evidence of UTI    Urine analysis:    Component Value Date/Time   COLORURINE YELLOW 03/13/2021 1730   APPEARANCEUR CLEAR 03/13/2021 1730   LABSPEC >1.030 (H) 03/13/2021 1730   PHURINE 6.0 03/13/2021 1730   GLUCOSEU NEGATIVE 03/13/2021 1730   HGBUR TRACE (A) 03/13/2021 1730   BILIRUBINUR NEGATIVE 03/13/2021 1730   KETONESUR 40 (A) 03/13/2021 1730   PROTEINUR 100 (A) 03/13/2021  1730   UROBILINOGEN 0.2 07/15/2013 2107   NITRITE NEGATIVE 03/13/2021 1730   LEUKOCYTESUR NEGATIVE 03/13/2021 1730    Results for orders placed or performed during the hospital encounter of 03/13/21  Resp Panel by RT-PCR (Flu A&B, Covid)     Status: None   Collection Time: 03/13/21  5:00 PM   Specimen: Nasopharyngeal(NP) swabs in vial transport medium  Result Value Ref Range Status   SARS Coronavirus 2 by RT PCR NEGATIVE NEGATIVE Final         Influenza A by PCR NEGATIVE NEGATIVE Final   Influenza B by PCR NEGATIVE NEGATIVE Final          _______________________________________________ Hospitalist was called for admission for cellulitis and SEPSIS  The following Work up has been ordered so far:  Orders Placed This Encounter  Procedures   Blood Culture (routine x 2)   Urine Culture   Resp Panel by RT-PCR (Flu A&B, Covid) Nasopharyngeal Swab   DG Lumbar Spine Complete   DG Pelvis 1-2 Views   DG Chest 1 View   DG Hip Unilat W or Wo Pelvis 2-3 Views Right   CT Abdomen Pelvis Wo Contrast   Lactic acid, plasma   Comprehensive metabolic panel   CBC WITH DIFFERENTIAL   Urinalysis, Routine w reflex microscopic   Protime-INR   APTT   Urinalysis, Microscopic (reflex)   Diet NPO time specified   Cardiac monitoring   Document height and weight   Assess and Document Glasgow Coma Scale   Document vital signs within 1-hour of fluid bolus completion.  Notify provider of abnormal vital signs despite fluid resuscitation.   Refer to Sidebar Report: Sepsis Bundle ED/IP   Notify provider for difficulties obtaining IV access   Initiate Carrier Fluid Protocol   DO NOT delay antibiotics if unable to obtain blood culture.   Code Sepsis activation.  This occurs automatically when order is signed and prioritizes pharmacy, lab, and radiology services for STAT collections and interventions.  If [facility] downtime, call [facility] [phone]) to activate Code Sepsis.   pharmacy consult   Consult to  hospitalist   Pulse oximetry, continuous   ED EKG 12-Lead   Insert peripheral IV X 1   Insert 2nd peripheral IV if not already present.     Following Medications were ordered in ER: Medications  lactated ringers infusion (0 mLs Intravenous Stopped 03/13/21 1933)  lactated ringers infusion (has no administration in time range)  lactated ringers bolus 1,000 mL (0 mLs Intravenous Stopped 03/13/21 2008)    And  lactated ringers bolus 1,000 mL (1,000 mLs Intravenous New Bag/Given 03/13/21 2009)    And  lactated ringers bolus 500 mL (has no administration in time range)  vancomycin (VANCOREADY) IVPB 1500 mg/300 mL (1,500 mg Intravenous New Bag/Given 03/13/21 2008)  ceFEPIme (MAXIPIME) 2 g in sodium chloride 0.9 % 100 mL IVPB (0 g Intravenous Stopped 03/13/21 2007)  metroNIDAZOLE (FLAGYL) IVPB 500 mg (0 mg Intravenous Stopped 03/13/21 2007)  acetaminophen (TYLENOL) suppository 650 mg (650 mg Rectal Given 03/13/21 2007)  Consult Orders  (From admission, onward)           Start     Ordered   03/13/21 1955  Consult to hospitalist  Once       Provider:  (Not yet assigned)  Question Answer Comment  Place call to: [facility]   Reason for Consult Admit      03/13/21 1954             OTHER Significant initial  Findings:  labs showing:    Recent Labs  Lab 03/13/21 1715 03/13/21 2314  NA 142  --   K 3.6  --   CO2 29  --   GLUCOSE 112*  --   BUN 25*  --   CREATININE 1.02  --   CALCIUM 9.9  --   MG  --  1.5*  PHOS  --  2.1*    Cr   Up from baseline see below Lab Results  Component Value Date   CREATININE 1.02 03/13/2021   CREATININE 0.97 03/29/2018   CREATININE 1.00 03/28/2018    Recent Labs  Lab 03/13/21 1715  AST 38  ALT 26  ALKPHOS 90  BILITOT 0.9  PROT 8.3*  ALBUMIN 4.8   Lab Results  Component Value Date   CALCIUM 9.9 03/13/2021   PHOS 2.1 (L) 03/13/2021          Plt: Lab Results  Component Value Date   PLT 181 03/13/2021       Venous  Blood Gas result:  pH 7.353  pCO2  51.8  ABG    Component Value Date/Time   PHART 7.343 (L) 06/13/2010 2044   PCO2ART 38.5 06/13/2010 2044   PO2ART 133.0 (H) 06/13/2010 2044   HCO3 20.9 06/13/2010 2044   TCO2 21 06/13/2010 2208   ACIDBASEDEF 4.0 (H) 06/13/2010 2044   O2SAT 99.0 06/13/2010 2044       Recent Labs  Lab 03/13/21 1715  WBC 12.7*  NEUTROABS 9.8*  HGB 16.3  HCT 49.8  MCV 96.5  PLT 181    HG/HCT  stable,      Component Value Date/Time   HGB 16.3 03/13/2021 1715   HGB 14.3 01/02/2017 1125   HCT 49.8 03/13/2021 1715   HCT 42.5 01/02/2017 1125   MCV 96.5 03/13/2021 1715   MCV 95.9 01/02/2017 1125    Recent Labs  Lab 03/13/21 2314  AMMONIA 16     Cardiac Panel (last 3 results) Recent Labs    03/13/21 2314  CKTOTAL 257     BNP (last 3 results) Recent Labs    03/13/21 2314  BNP 155.1*          Cultures:    Component Value Date/Time   SDES WOUND THUMB RIGHT 05/02/2015 1006   SDES WOUND THUMB RIGHT 05/02/2015 1006   SDES WOUND THUMB RIGHT 05/02/2015 1006   SDES WOUND THUMB RIGHT 05/02/2015 1006   SPECREQUEST NONE 05/02/2015 1006   SPECREQUEST NONE 05/02/2015 1006   SPECREQUEST NONE 05/02/2015 1006   SPECREQUEST NONE 05/02/2015 1006   CULT  05/02/2015 1006    NO GROWTH 2 DAYS Performed at [facility]  05/02/2015 1006    NO ANAEROBES ISOLATED Performed at [facility]  05/02/2015 1006    NO ACID FAST BACILLI ISOLATED IN 6 WEEKS Performed at [facility]  05/02/2015 1006    No Fungi Isolated in 4 Weeks Performed at [facility]    REPTSTATUS 05/05/2015 FINAL 05/02/2015 1006   REPTSTATUS 05/07/2015 FINAL 05/02/2015 1006   REPTSTATUS 06/15/2015 FINAL 05/02/2015 1006   REPTSTATUS 05/30/2015 FINAL 05/02/2015 1006     Radiological Exams on Admission: CT Abdomen Pelvis Wo Contrast  Result Date: 03/13/2021 CLINICAL DATA:  Increased agitation, leukocytosis, suspected  abscess/infection, history prostate cancer, coronary artery disease EXAM: CT ABDOMEN AND PELVIS WITHOUT CONTRAST TECHNIQUE: Multidetector CT imaging of the abdomen and pelvis was performed following the standard protocol without IV contrast. Scattered respiratory motion artifacts. COMPARISON:  01/05/2018 FINDINGS: Lower chest: Subsegmental atelectasis RIGHT lower lobe. Hepatobiliary: Gallbladder and liver normal appearance Pancreas: Normal appearance Spleen: Normal appearance Adrenals/Urinary Tract: Adrenal glands, kidneys, ureters, and bladder normal appearance Stomach/Bowel: Bowel interposition between liver and diaphragm. Increased stool in rectum and distal colon. Stomach decompressed. Remaining bowel loops normal. Appendix surgically absent by history. Vascular/Lymphatic: Atherosclerotic calcifications aorta and coronary arteries. Aorta normal caliber. No adenopathy. Reproductive: Prostate gland and seminal vesicles surgically absent Other: Small umbilical and infraumbilical ventral hernias containing fat. LEFT inguinal hernia containing fat. No free air or free fluid. Musculoskeletal: Osseous demineralization. IMPRESSION: Increased stool in rectum and distal colon. Small umbilical and infraumbilical ventral hernias containing fat. LEFT inguinal hernia containing fat. No acute intra-abdominal or intrapelvic abnormalities. Aortic Atherosclerosis (ICD10-I70.0). Electronically Signed   By: Lavonia Dana M.D.   On: 03/13/2021 19:45   DG Chest 1 View  Result Date: 03/13/2021 CLINICAL DATA:  Fall. EXAM: CHEST  1 VIEW COMPARISON:  Chest x-ray 08/16/2019. FINDINGS: There is stable moderate elevation of the right hemidiaphragm. There is no lung consolidation, pleural effusion or pneumothorax. Cardiomediastinal silhouette is stable, the heart is enlarged. Prosthetic heart valve again noted. No acute fractures are seen. IMPRESSION: 1. No acute cardiopulmonary process. 2. Stable cardiomegaly and elevation of the right  hemidiaphragm. Electronically Signed   By: Ronney Asters M.D.   On: 03/13/2021 16:58   DG Lumbar Spine Complete  Result Date: 03/13/2021 CLINICAL DATA:  Fall EXAM: LUMBAR SPINE - COMPLETE 4+ VIEW COMPARISON:  CT abdomen/pelvis 01/05/2018 FINDINGS: There are 5 non-rib-bearing lumbar type vertebral bodies. Mild compression deformity of the T11 vertebral body is similar in appearance to the prior CT from 2019. Vertebral body heights otherwise appear preserved, without definite evidence of acute fracture. Alignment is normal, with no significant antero or retrolisthesis. A right-sided pars defect is seen at L5-S1, as seen on prior CT. There is no definite evidence of spondylolysis at the other levels. There is mild multilevel degenerative endplate change and disc space narrowing. There is multilevel facet arthropathy, most advanced at L4-L5 and L5-S1. There is mild gaseous distention of the bowel throughout the abdomen. IMPRESSION: 1. No definite evidence of acute traumatic injury in the lumbar spine. 2. Multilevel degenerative changes as above. 3. Mild gaseous distention of the bowel throughout the abdomen without evidence of mechanical obstruction, nonspecific. Electronically Signed   By: Valetta Mole M.D.   On: 03/13/2021 16:57   DG Pelvis 1-2 Views  Result Date: 03/13/2021 CLINICAL DATA:  Fall. EXAM: PELVIS - 1-2 VIEW COMPARISON:  None. FINDINGS: There is foreshortening of the right femoral neck. Subcapital fracture can not be excluded. There is no dislocation. Joint spaces are maintained. Soft tissues are within normal limits. Surgical clips overlie the right inguinal region. IMPRESSION: 1. Can not exclude subcapital right femoral neck fracture. Recommend dedicated views of the right hip for further evaluation. Electronically Signed   By: Ronney Asters M.D.   On: 03/13/2021 16:57  CT HEAD WO CONTRAST (5MM)  Result Date: 03/13/2021 CLINICAL DATA:  Status post fall. EXAM: CT HEAD WITHOUT CONTRAST  TECHNIQUE: Contiguous axial images were obtained from the base of the skull through the vertex without intravenous contrast. COMPARISON:  April 23, 2019 FINDINGS: Brain: There is mild to moderate severity cerebral atrophy with widening of the extra-axial spaces and ventricular dilatation. There are areas of decreased attenuation within the white matter tracts of the supratentorial brain, consistent with microvascular disease changes. Vascular: Calcification of the right cavernous carotid arteries noted. Skull: Normal. Negative for fracture or focal lesion. Sinuses/Orbits: No acute finding. Other: None. IMPRESSION: 1. Generalized cerebral atrophy. 2. No acute intracranial abnormality. Electronically Signed   By: Virgina Norfolk M.D.   On: 03/13/2021 22:12   DG Foot 2 Views Right  Result Date: 03/13/2021 CLINICAL DATA:  Cellulitis. EXAM: RIGHT FOOT - 2 VIEW COMPARISON:  None. FINDINGS: There is no evidence of fracture or dislocation. There is no evidence of arthropathy or other focal bone abnormality. Soft tissues are unremarkable. IMPRESSION: Negative. Electronically Signed   By: Ronney Asters M.D.   On: 03/13/2021 21:49   DG Hip Unilat W or Wo Pelvis 2-3 Views Right  Result Date: 03/13/2021 CLINICAL DATA:  Fall EXAM: DG HIP (WITH OR WITHOUT PELVIS) 2-3V RIGHT COMPARISON:  None. FINDINGS: SI joints are non widened. Pubic symphysis and rami appear intact. Clips in the right groin. No fracture or malalignment. IMPRESSION: No acute osseous abnormality Electronically Signed   By: Donavan Foil M.D.   On: 03/13/2021 19:05   _______________________________________________________________________________________________________ Latest  Blood pressure (!) 148/86, pulse (!) 114, temperature (!) 101.4 F (38.6 C), temperature source Rectal, resp. rate (!) 26, height _0  (1.727 m), weight 77 kg, SpO2 93 %.   Review of Systems:    Pertinent positives include:  Fevers, chills, fatigue,  Constitutional:   No weight loss, night sweats,  weight loss  HEENT:  No headaches, Difficulty swallowing,Tooth/dental problems,Sore throat,  No sneezing, itching, ear ache, nasal congestion, post nasal drip,  Cardio-vascular:  No chest pain, Orthopnea, PND, anasarca, dizziness, palpitations.no Bilateral lower extremity swelling  GI:  No heartburn, indigestion, abdominal pain, nausea, vomiting, diarrhea, change in bowel habits, loss of appetite, melena, blood in stool, hematemesis Resp:  no shortness of breath at rest. No dyspnea on exertion, No excess mucus, no productive cough, No non-productive cough, No coughing up of blood.No change in color of mucus.No wheezing. Skin:  no rash or lesions. No jaundice GU:  no dysuria, change in color of urine, no urgency or frequency. No straining to urinate.  No flank pain.  Musculoskeletal:  No joint pain or no joint swelling. No decreased range of motion. No back pain.  Psych:  No change in mood or affect. No depression or anxiety. No memory loss.  Neuro: no localizing neurological complaints, no tingling, no weakness, no double vision, no gait abnormality, no slurred speech, no confusion  All systems reviewed and apart from [place] all are negative _______________________________________________________________________________________________ Past Medical History:   Past Medical History:  Diagnosis Date   Asthma    CAD (coronary artery disease)    Cancer ([place]) 2016   prostate   Complication of anesthesia    CO2 high according to patient    Family history of ovarian cancer    GERD (gastroesophageal reflux disease)    History of melanoma    History of prostate cancer    HTN (hypertension)    Mitral valve prolapse  Osteoporosis    TIA (transient ischemic attack)    UTI (lower urinary tract infection)       Past Surgical History:  Procedure Laterality Date   APPENDECTOMY     CORONARY ANGIOPLASTY WITH STENT PLACEMENT  04/30/2010   PCI and stenting  of proximal RCA   MITRAL VALVE REPAIR  06/13/2010   right mini thoracotomy for complex valvuloplasty with 5m Memo ring annuloplasty - Dr. ORoxy Manns  NAILBED REPAIR Right 05/02/2015   Procedure: BIOPSY NAIL BED RIGHT THUMB;  Surgeon: GDaryll Brod MD;  [facility]  Service: Orthopedics;  Laterality: Right;  ANESTHESIA:  [facility]   PROSTATECTOMY  2016    Social History:  Ambulatory  cane,    reports that he has never smoked. He has never used smokeless tobacco. He reports that he does not currently use alcohol. He reports that he does not use drugs.     Family History:   Family History  Problem Relation Age of Onset   Heart disease Father    Heart disease Mother    Cirrhosis Brother    Ulcers Brother    Ovarian cancer Other        d. [phone]  ______________________________________________________________________________________________ Allergies: Allergies  Allergen Reactions   Penicillins Itching and Rash    Has patient had a PCN reaction causing immediate rash, facial/tongue/throat swelling, SOB or lightheadedness with hypotension: YES Has patient had a PCN reaction causing severe rash involving mucus membranes or skin necrosis: NO Has patient had a PCN reaction that required hospitalization: NO Has patient had a PCN reaction occurring within the last 10 years: NO If all of the above answers are "NO", then may proceed with Cephalosporin use.     Prior to Admission medications   Medication Sig Start Date End Date Taking? Authorizing Provider  Ascorbic Acid (VITAMIN C) 1000 MG tablet Take 500 mg by mouth daily.    [provider]  aspirin 81 MG tablet Take 81 mg by mouth daily.    [provider]  atorvastatin (LIPITOR) 10 MG tablet TAKE 1 TABLET BY MOUTH  DAILY 06/14/19   GAdrian Prows MD  calcium carbonate (TUMS - DOSED IN MG ELEMENTAL CALCIUM) 500 MG chewable tablet Chew 1 tablet by mouth daily.    [provider]   Cholecalciferol (VITAJOY DAILY D GUMMIES) 25 MCG (1000 UT) CHEW Chew 2,000 Units by mouth.    [provider]  escitalopram (LEXAPRO) 20 MG tablet Take 1 tablet (20 mg total) by mouth daily. 02/14/21   ACameron Sprang MD  Ketotifen Fumarate (RSt. Mary of the WoodsOP) Apply to eye.    [provider]  losartan (COZAAR) 50 MG tablet Take 50 mg by mouth daily.    [provider]  Melatonin-Pyridoxine (MELATIN PO) Take 3 mg by mouth. Takes at [employer] Historical, MD  Multiple Vitamins-Minerals (EQ MULTIVITAMINS ADULT GUMMY PO) Take by mouth daily.    [provider]  NON FORMULARY Take by mouth 3 (three) times daily. CBD oil BID    [provider]  polyethylene glycol powder (GLYCOLAX/MIRALAX) 17 GM/SCOOP powder Take 17 g by mouth daily. 01/08/21   [provider]  rivastigmine (EXELON) 4.6 mg/24hr Place 1 patch (4.6 mg total) onto the skin daily. 02/14/21   ACameron Sprang MD    ___________________________________________________________________________________________________ Physical Exam: Vitals with BMI 03/13/2021 03/13/2021 03/13/2021  Height - - -  Weight - - -  BMI - - -  Systolic 053 976 734  Diastolic 86 193 790  Pulse 114 104 110     1. General:  in No  Acute distress   Chronically ill  -appearing 2. Psychological: lethargic not  Oriented 3. Head/ENT:    Dry Mucous Membranes                          Head Non traumatic, neck supple                           Poor Dentition 4. SKIN:  decreased Skin turgor,  Skin clean Dry and intact  redness and warmth of right foot noted 5. Heart: Regular rate and rhythm no  Murmur, no Rub or gallop 6. Lungs:  no wheezes or crackles   7. Abdomen: Soft,   non-tender, Non distended   obese  bowel sounds present 8. Lower extremities: no clubbing, cyanosis,   mild edema right  9. Neurologically Grossly intact, moving all 4 extremities equally some stiffness noted of lower extremities  if patient passively resisting motion,   10. MSK: Normal range of motion    Chart has been reviewed  ______________________________________________________________________________________________  Assessment/Plan  80 y.o. male with medical history significant of  Asthma, CAD, prostate cancer, GERD, HTN, TIA status post mitral valve repair Admitted for Sepsis and cellulitis, obstipation, ?? Serotonin syndrome  Present on Admission:  SIRS (systemic inflammatory response syndrome) (HCC)  -SIRS criteria met with  elevated white blood cell count,       Component Value Date/Time   WBC 12.7 (H) 03/13/2021 1715   LYMPHSABS 0.7 03/13/2021 2314   LYMPHSABS 1.3 01/02/2017 1125    tachycardia    fever   RR >20 Today's Vitals   03/13/21 2100 03/13/21 2130 03/13/21 2240 03/14/21 0100  BP: (!) 148/84 (!) 153/94 (!) 146/80   Pulse: 100 100 (!) 107   Resp: (!) 27 (!) 25 20   Temp:   98.2 F (36.8 C)   TempSrc:   Oral   SpO2: 95% 90% 100%   Weight:   77.3 kg   Height:   _0  (1.727 m)   PainSc:    0-No pain    -Most likely source being Cellulitis       Patient meeting criteria for Severe sepsis with  elevated lactic acid >2  acute metabolic encephalopathy   - Obtain serial lactic acid and procalcitonin level.  - Initiated IV antibiotics in ER: Antibiotics Given (last 72 hours)     Date/Time Action Medication Dose Rate   03/13/21 1859 New Bag/Given   ceFEPIme (MAXIPIME) 2 g in sodium chloride 0.9 % 100 mL IVPB 2 g 200 mL/hr   03/13/21 1902 New Bag/Given   metroNIDAZOLE (FLAGYL) IVPB 500 mg 500 mg 100 mL/hr   03/13/21 2008 New Bag/Given   vancomycin (VANCOREADY) IVPB 1500 mg/300 mL 1,500 mg 150 mL/hr       Will continue  on : cefepime/vanc For tonight cont flagyl given abd tenderness   - await results of blood and urine culture  - Rehydrate aggressively  Intravenous fluids were administered,     1:30 AM   Acute metabolic encephalopathy -    - most likely multifactorial  secondary to combination of  infection  mild dehydration secondary to decreased by mouth intake,  polypharmacy   - Will rehydrate   - treat underlining infection   - Hold contributing medications   -  if no improvement may need further imaging to evaluate for CNS pathology pathology such as MRI of the brain   - neurological exam appears to be nonfocal but patient unable to cooperate fully   - VBG unremarkable no evidence of hypercarbia    - no history of liver disease ammonia unremarkable  Given somewhat and increased generalized stiffness initial presentation of fever and tachycardia Possibility of serotonin syndrome should be considered.  Hold SSRIs Supportive management and Ativan as needed  Hip pain -patient seems to be tender when moved it is hard to localize pain to either right or left hip also tender when knees are touched or toes or abdomen Imaging showed no evidence of fracture If persistent could consider further imaging and orthopedics consult   Asthma, moderate persistent, poorly-controlled - albuterol PRN    CAD (coronary artery disease) -stable restart home medications when able to tolerate   Cancer of prostate with low recurrence risk (stage T1-2a and Gleason < 7 and PSA < 10) (HCC) Chronic stable   Gastroesophageal reflux disease -restart PPI when he   HLD (hyperlipidemia) -restart statin if CK within normal   HTN (hypertension) -allow some permissive hypertension for tonight continue to Monitor   Moderate dementia with behavioral disturbance (Friend) expect some degree of sundowning.   Constipation bowel regimen and enema if able to tolerate may need repeat KUB after bowel movement to ensure relief   Hypomagnesemia - will replace    Hypophosphatemia- will replace    Other plan as per orders.  DVT prophylaxis:  SCD     Code Status:  DNR/DNI   as per  family  I had personally discussed CODE STATUS with patient and family    Family Communication:   Family     at  Bedside  plan of care was discussed  with  Wife,    Disposition Plan:                              Back to current facility when stable                           Following barriers for discharge:                            Electrolytes corrected                               Anemia corrected                             Pain controlled with PO medications                               Afebrile, white count improving able to transition to PO antibiotics                             Will need to be able to tolerate PO                            Will likely need home health, home O2, set up  Will need consultants to evaluate patient prior to discharge                                         Swallow eval - SLP ordered                                      Transition of care consulted                                      Palliative care    consulted                                       Consults called: none  Admission status:  ED Disposition     ED Disposition  Admit   Condition  --   Christine: [place] [[id number]]  Level of Care: Progressive [102]  Admit to Progressive based on following criteria: MULTISYSTEM THREATS such as stable sepsis, metabolic/electrolyte imbalance with or without encephalopathy that is responding to early treatment.  May [facility] to [facility] or [facility] if equivalent level of care is available:: No  Covid Evaluation: Confirmed COVID Negative  Diagnosis: SIRS (systemic inflammatory response syndrome) [facility]) [[id number]]  Admitting Physician: Toy Baker [[id number]]  Attending Physician: Toy Baker [[id number]]  Estimated length of stay: past midnight tomorrow  Certification:: I certify this patient will need inpatient services for at least 2 midnights          inpatient     I Expect 2 midnight stay secondary to severity of patient's current illness need for inpatient  interventions justified by the following:  hemodynamic instability despite optimal treatment (tachycardia )   and extensive comorbidities including: CHF  CAD  COPD/asthma   dementia    malignancy,   That are currently affecting medical management.   I expect  patient to be hospitalized for 2 midnights requiring inpatient medical care.  Patient is at high risk for adverse outcome (such as loss of life or disability) if not treated.  Indication for inpatient stay as follows:  Severe change from baseline regarding mental status     Need for IV antibiotics, IV fluids,     Level of care   progressive tele indefinitely please discontinue once patient no longer qualifies COVID-19 Labs    Lab Results  Component Value Date   [place] 03/13/2021     Precautions: admitted as   Covid Negative    PPE: Used by the provider:   N95  eye Goggles,  Gloves    Karma Ansley 03/14/2021, 1:52 AM   [facility]     after 2 AM please page floor coverage PA If 7AM-7PM, please contact the day team taking care of the patient using [email or web address]   Patient was evaluated in the context of the global COVID-19 pandemic, which necessitated consideration that the patient might be at risk for infection with the SARS-CoV-2 virus that causes COVID-19. Institutional protocols and algorithms that pertain to the evaluation of patients at risk for COVID-19 are in a state of rapid change based on information released by regulatory bodies including  the [employer] and federal and state organizations. These policies and algorithms were followed during the patient's care.

## 2021-03-13 NOTE — ED Notes (Signed)
ED TO INPATIENT HANDOFF REPORT  ED Nurse Name and Phone #:   S Name/Age/Gender [name] 80 y.o. male Room/Bed: WA09/WA09  Code Status   Code Status: Prior  Home/SNF/Other Skilled nursing facility = Is this baseline? Yes   Triage Complete: Triage complete  Chief Complaint SIRS (systemic inflammatory response syndrome) (HCC) [R65.10]  Triage Note Pt bib ems from [name] memory unit for increased agitation.    Allergies Allergies  Allergen Reactions   Aricept [Donepezil]     Upset stomach   Depakote [Divalproex Sodium]     Made me feel like a different person   Penicillins Itching and Rash    Has patient had a PCN reaction causing immediate rash, facial/tongue/throat swelling, SOB or lightheadedness with hypotension: YES Has patient had a PCN reaction causing severe rash involving mucus membranes or skin necrosis: NO Has patient had a PCN reaction that required hospitalization: NO Has patient had a PCN reaction occurring within the last 10 years: NO If all of the above answers are "NO", then may proceed with Cephalosporin use.    Level of Care/Admitting Diagnosis ED Disposition     ED Disposition  Admit   Condition  --   Comment  Hospital Area: [place] [[id number]]  Level of Care: Progressive [102]  Admit to Progressive based on following criteria: MULTISYSTEM THREATS such as stable sepsis, metabolic/electrolyte imbalance with or without encephalopathy that is responding to early treatment.  May admit patient to [facility] if equivalent level of care is available:: No  Covid Evaluation: Confirmed COVID Negative  Diagnosis: SIRS (systemic inflammatory response syndrome) [facility]) [[id number]]  Admitting Physician: Toy Baker [[id number]]  Attending Physician: Toy Baker [[id number]]  Estimated length of stay: past midnight tomorrow  Certification:: I certify this patient will need inpatient services for at least 2  midnights          B Medical/Surgery History Past Medical History:  Diagnosis Date   Asthma    CAD (coronary artery disease)    Cancer ([place]) 2016   prostate   Complication of anesthesia    CO2 high according to patient    Family history of ovarian cancer    GERD (gastroesophageal reflux disease)    History of melanoma    History of prostate cancer    HTN (hypertension)    Mitral valve prolapse    Osteoporosis    TIA (transient ischemic attack)    UTI (lower urinary tract infection)    Past Surgical History:  Procedure Laterality Date   APPENDECTOMY     CORONARY ANGIOPLASTY WITH STENT PLACEMENT  04/30/2010   PCI and stenting of proximal RCA   MITRAL VALVE REPAIR  06/13/2010   right mini thoracotomy for complex valvuloplasty with 37mm Memo ring annuloplasty - Dr. Roxy Manns   NAILBED REPAIR Right 05/02/2015   Procedure: BIOPSY NAIL BED RIGHT THUMB;  Surgeon: Daryll Brod, MD;  Location: [facility];  Service: Orthopedics;  Laterality: Right;  ANESTHESIA:  IV REGIONAL FAB   PROSTATECTOMY  2016     A IV Location/Drains/Wounds Patient Lines/Drains/Airways Status     Active Line/Drains/Airways     Name Placement date Placement time Site Days   Peripheral IV 03/13/21 20 G Right;Lateral Antecubital 03/13/21  1700  Antecubital  less than 1   Incision (Closed) 05/02/15 Hand Right 05/02/15  1020  -- 2142            Intake/Output Last 24 hours No intake or  output data in the 24 hours ending 03/13/21 2045  Labs/Imaging Results for orders placed or performed during the hospital encounter of 03/13/21 (from the past 48 hour(s))  Resp Panel by RT-PCR (Flu A&B, Covid)     Status: None   Collection Time: 03/13/21  5:00 PM   Specimen: Nasopharyngeal(NP) swabs in vial transport medium  Result Value Ref Range   SARS Coronavirus 2 by RT PCR NEGATIVE NEGATIVE    Comment: (NOTE) SARS-CoV-2 target nucleic acids are NOT DETECTED.  The SARS-CoV-2 RNA is generally  detectable in upper respiratory specimens during the acute phase of infection. The lowest concentration of SARS-CoV-2 viral copies this assay can detect is 138 copies/mL. A negative result does not preclude SARS-Cov-2 infection and should not be used as the sole basis for treatment or other patient management decisions. A negative result may occur with  improper specimen collection/handling, submission of specimen other than nasopharyngeal swab, presence of viral mutation(s) within the areas targeted by this assay, and inadequate number of viral copies(<138 copies/mL). A negative result must be combined with clinical observations, patient history, and epidemiological information. The expected result is Negative.  Fact Sheet for Patients:  EntrepreneurPulse.com.au  Fact Sheet for Healthcare Providers:  IncredibleEmployment.be  This test is no t yet approved or cleared by the [place] FDA and  has been authorized for detection and/or diagnosis of SARS-CoV-2 by FDA under an Emergency Use Authorization (EUA). This EUA will remain  in effect (meaning this test can be used) for the duration of the COVID-19 declaration under Section 564(b)(1) of the Act, 21 U.S.C.section 360bbb-3(b)(1), unless the authorization is terminated  or revoked sooner.       Influenza A by PCR NEGATIVE NEGATIVE   Influenza B by PCR NEGATIVE NEGATIVE    Comment: (NOTE) The Xpert Xpress SARS-CoV-2/FLU/RSV plus assay is intended as an aid in the diagnosis of influenza from Nasopharyngeal swab specimens and should not be used as a sole basis for treatment. Nasal washings and aspirates are unacceptable for Xpert Xpress SARS-CoV-2/FLU/RSV testing.  Fact Sheet for Patients: EntrepreneurPulse.com.au  Fact Sheet for Healthcare Providers: IncredibleEmployment.be  This test is not yet approved or cleared by the [place] FDA and has  been authorized for detection and/or diagnosis of SARS-CoV-2 by FDA under an Emergency Use Authorization (EUA). This EUA will remain in effect (meaning this test can be used) for the duration of the COVID-19 declaration under Section 564(b)(1) of the Act, 21 U.S.C. section 360bbb-3(b)(1), unless the authorization is terminated or revoked.  Performed at [facility], [place] [address] [place], [place] [address]   Lactic acid, plasma     Status: Abnormal   Collection Time: 03/13/21  5:15 PM  Result Value Ref Range   Lactic Acid, Venous 3.6 (HH) 0.5 - 1.9 mmol/L    Comment: CRITICAL RESULT CALLED TO, READ BACK BY AND VERIFIED WITH:  BIENFANIG, A RN @1836  03/13/21 EDENSCA Performed at [facility], [place] [address], [place], [place] [address]   Comprehensive metabolic panel     Status: Abnormal   Collection Time: 03/13/21  5:15 PM  Result Value Ref Range   Sodium 142 135 - 145 mmol/L   Potassium 3.6 3.5 - 5.1 mmol/L   Chloride 102 98 - 111 mmol/L   CO2 29 22 - 32 mmol/L   Glucose, Bld 112 (H) 70 - 99 mg/dL    Comment: Glucose reference range applies only to samples taken after fasting for at least 8 hours.  BUN 25 (H) 8 - 23 mg/dL   Creatinine, Ser 1.02 0.61 - 1.24 mg/dL   Calcium 9.9 8.9 - 10.3 mg/dL   Total Protein 8.3 (H) 6.5 - 8.1 g/dL   Albumin 4.8 3.5 - 5.0 g/dL   AST 38 15 - 41 U/L   ALT 26 0 - 44 U/L   Alkaline Phosphatase 90 38 - 126 U/L   Total Bilirubin 0.9 0.3 - 1.2 mg/dL   GFR, Estimated >60 >60 mL/min    Comment: (NOTE) Calculated using the CKD-EPI Creatinine Equation (2021)    Anion gap 11 5 - 15    Comment: Performed at [facility], [place] [address], [place], [place] [address]  CBC WITH DIFFERENTIAL     Status: Abnormal   Collection Time: 03/13/21  5:15 PM  Result Value Ref Range   WBC 12.7 (H) 4.0 - 10.5 K/uL   RBC 5.16 4.22 - 5.81 MIL/uL   Hemoglobin 16.3 13.0 - 17.0 g/dL   HCT 49.8 39.0 - 52.0 %   MCV  96.5 80.0 - 100.0 fL   MCH 31.6 26.0 - 34.0 pg   MCHC 32.7 30.0 - 36.0 g/dL   RDW 13.7 11.5 - 15.5 %   Platelets 181 150 - 400 K/uL   nRBC 0.0 0.0 - 0.2 %   Neutrophils Relative % 77 %   Neutro Abs 9.8 (H) 1.7 - 7.7 K/uL   Lymphocytes Relative 11 %   Lymphs Abs 1.4 0.7 - 4.0 K/uL   Monocytes Relative 10 %   Monocytes Absolute 1.3 (H) 0.1 - 1.0 K/uL   Eosinophils Relative 1 %   Eosinophils Absolute 0.1 0.0 - 0.5 K/uL   Basophils Relative 1 %   Basophils Absolute 0.1 0.0 - 0.1 K/uL   Immature Granulocytes 0 %   Abs Immature Granulocytes 0.04 0.00 - 0.07 K/uL    Comment: Performed at [facility], [place] [address], [place], [place] [address]  Urinalysis, Routine w reflex microscopic Urine, In & Out Cath     Status: Abnormal   Collection Time: 03/13/21  5:30 PM  Result Value Ref Range   Color, Urine YELLOW YELLOW   APPearance CLEAR CLEAR   Specific Gravity, Urine >1.030 (H) 1.005 - 1.030   pH 6.0 5.0 - 8.0   Glucose, UA NEGATIVE NEGATIVE mg/dL   Hgb urine dipstick TRACE (A) NEGATIVE   Bilirubin Urine NEGATIVE NEGATIVE   Ketones, ur 40 (A) NEGATIVE mg/dL   Protein, ur 100 (A) NEGATIVE mg/dL   Nitrite NEGATIVE NEGATIVE   Leukocytes,Ua NEGATIVE NEGATIVE    Comment: Performed at [facility], [place] [name], [place], [place] [address]  Protime-INR     Status: None   Collection Time: 03/13/21  5:30 PM  Result Value Ref Range   Prothrombin Time 14.1 11.4 - 15.2 seconds   INR 1.1 0.8 - 1.2    Comment: (NOTE) INR goal varies based on device and disease states. Performed at [facility], [place] [address], [place], [place] [address]   APTT     Status: None   Collection Time: 03/13/21  5:30 PM  Result Value Ref Range   aPTT 33 24 - 36 seconds    Comment: Performed at [facility], [place] [address] [place], [place] [address]  Urinalysis, Microscopic (reflex)     Status: Abnormal   Collection Time: 03/13/21  5:30  PM  Result Value Ref Range   RBC / HPF 0-5  0 - 5 RBC/hpf   WBC, UA 0-5 0 - 5 WBC/hpf   Bacteria, UA NONE SEEN NONE SEEN    Comment: NONE SEEN   Squamous Epithelial / LPF 0-5 0 - 5   Crystals AMMONIUM BIURATE CRYSTALS PRESENT (A) NEGATIVE    Comment: Performed at [facility], [place] [address], [place], [place] [address]   CT Abdomen Pelvis Wo Contrast  Result Date: 03/13/2021 CLINICAL DATA:  Increased agitation, leukocytosis, suspected abscess/infection, history prostate cancer, coronary artery disease EXAM: CT ABDOMEN AND PELVIS WITHOUT CONTRAST TECHNIQUE: Multidetector CT imaging of the abdomen and pelvis was performed following the standard protocol without IV contrast. Scattered respiratory motion artifacts. COMPARISON:  01/05/2018 FINDINGS: Lower chest: Subsegmental atelectasis RIGHT lower lobe. Hepatobiliary: Gallbladder and liver normal appearance Pancreas: Normal appearance Spleen: Normal appearance Adrenals/Urinary Tract: Adrenal glands, kidneys, ureters, and bladder normal appearance Stomach/Bowel: Bowel interposition between liver and diaphragm. Increased stool in rectum and distal colon. Stomach decompressed. Remaining bowel loops normal. Appendix surgically absent by history. Vascular/Lymphatic: Atherosclerotic calcifications aorta and coronary arteries. Aorta normal caliber. No adenopathy. Reproductive: Prostate gland and seminal vesicles surgically absent Other: Small umbilical and infraumbilical ventral hernias containing fat. LEFT inguinal hernia containing fat. No free air or free fluid. Musculoskeletal: Osseous demineralization. IMPRESSION: Increased stool in rectum and distal colon. Small umbilical and infraumbilical ventral hernias containing fat. LEFT inguinal hernia containing fat. No acute intra-abdominal or intrapelvic abnormalities. Aortic Atherosclerosis (ICD10-I70.0). Electronically Signed   By: Lavonia Dana M.D.   On: 03/13/2021 19:45   DG Chest 1  View  Result Date: 03/13/2021 CLINICAL DATA:  Fall. EXAM: CHEST  1 VIEW COMPARISON:  Chest x-ray 08/16/2019. FINDINGS: There is stable moderate elevation of the right hemidiaphragm. There is no lung consolidation, pleural effusion or pneumothorax. Cardiomediastinal silhouette is stable, the heart is enlarged. Prosthetic heart valve again noted. No acute fractures are seen. IMPRESSION: 1. No acute cardiopulmonary process. 2. Stable cardiomegaly and elevation of the right hemidiaphragm. Electronically Signed   By: Ronney Asters M.D.   On: 03/13/2021 16:58   DG Lumbar Spine Complete  Result Date: 03/13/2021 CLINICAL DATA:  Fall EXAM: LUMBAR SPINE - COMPLETE 4+ VIEW COMPARISON:  CT abdomen/pelvis 01/05/2018 FINDINGS: There are 5 non-rib-bearing lumbar type vertebral bodies. Mild compression deformity of the T11 vertebral body is similar in appearance to the prior CT from 2019. Vertebral body heights otherwise appear preserved, without definite evidence of acute fracture. Alignment is normal, with no significant antero or retrolisthesis. A right-sided pars defect is seen at L5-S1, as seen on prior CT. There is no definite evidence of spondylolysis at the other levels. There is mild multilevel degenerative endplate change and disc space narrowing. There is multilevel facet arthropathy, most advanced at L4-L5 and L5-S1. There is mild gaseous distention of the bowel throughout the abdomen. IMPRESSION: 1. No definite evidence of acute traumatic injury in the lumbar spine. 2. Multilevel degenerative changes as above. 3. Mild gaseous distention of the bowel throughout the abdomen without evidence of mechanical obstruction, nonspecific. Electronically Signed   By: Valetta Mole M.D.   On: 03/13/2021 16:57   DG Pelvis 1-2 Views  Result Date: 03/13/2021 CLINICAL DATA:  Fall. EXAM: PELVIS - 1-2 VIEW COMPARISON:  None. FINDINGS: There is foreshortening of the right femoral neck. Subcapital fracture can not be excluded.  There is no dislocation. Joint spaces are maintained. Soft tissues are within normal limits. Surgical clips overlie the right inguinal region. IMPRESSION: 1. Can not exclude subcapital right femoral  neck fracture. Recommend dedicated views of the right hip for further evaluation. Electronically Signed   By: Ronney Asters M.D.   On: 03/13/2021 16:57   DG Hip Unilat W or Wo Pelvis 2-3 Views Right  Result Date: 03/13/2021 CLINICAL DATA:  Fall EXAM: DG HIP (WITH OR WITHOUT PELVIS) 2-3V RIGHT COMPARISON:  None. FINDINGS: SI joints are non widened. Pubic symphysis and rami appear intact. Clips in the right groin. No fracture or malalignment. IMPRESSION: No acute osseous abnormality Electronically Signed   By: Donavan Foil M.D.   On: 03/13/2021 19:05    Pending Labs Unresulted Labs (From admission, onward)     Start     Ordered   03/13/21 2100  Lactic acid, plasma  STAT Now then every 3 hours,   STAT      03/13/21 2030   03/13/21 2030  Procalcitonin  Add-on,   AD        03/13/21 2030   03/13/21 2029  Respiratory (~20 pathogens) panel by PCR  (Respiratory panel by PCR (~20 pathogens, ~24 hr TAT)  w precautions)  Once,   R        03/13/21 2029   03/13/21 1551  Lactic acid, plasma  (Undifferentiated presentation (screening labs and basic nursing orders))  Now then every 2 hours,   STAT      03/13/21 1551   03/13/21 1551  Blood Culture (routine x 2)  (Undifferentiated presentation (screening labs and basic nursing orders))  BLOOD CULTURE X 2,   STAT      03/13/21 1551   03/13/21 1551  Urine Culture  (Undifferentiated presentation (screening labs and basic nursing orders))  ONCE - STAT,   STAT       Question:  Indication  Answer:  Dysuria   03/13/21 1551            Vitals/Pain Today's Vitals   03/13/21 1800 03/13/21 1900 03/13/21 2004 03/13/21 2030  BP: (!) 150/115 (!) 155/133 (!) 148/86 (!) 152/85  Pulse: (!) 110 (!) 104 (!) 114 (!) 103  Resp: (!) 29 (!) 22 (!) 26 (!) 29  Temp:       TempSrc:      SpO2: 96% 98% 93% 93%  Weight:      Height:        Isolation Precautions Droplet precaution  Medications Medications  lactated ringers infusion (0 mLs Intravenous Stopped 03/13/21 1933)  lactated ringers infusion (has no administration in time range)  lactated ringers bolus 1,000 mL (0 mLs Intravenous Stopped 03/13/21 2008)    And  lactated ringers bolus 1,000 mL (1,000 mLs Intravenous New Bag/Given 03/13/21 2009)    And  lactated ringers bolus 500 mL (has no administration in time range)  vancomycin (VANCOREADY) IVPB 1500 mg/300 mL (1,500 mg Intravenous New Bag/Given 03/13/21 2008)  ceFEPIme (MAXIPIME) 2 g in sodium chloride 0.9 % 100 mL IVPB (0 g Intravenous Stopped 03/13/21 2007)  metroNIDAZOLE (FLAGYL) IVPB 500 mg (0 mg Intravenous Stopped 03/13/21 2007)  acetaminophen (TYLENOL) suppository 650 mg (650 mg Rectal Given 03/13/21 2007)    Mobility walks with device High fall risk   Focused Assessments Neuro Assessment Handoff:  Swallow screen pass?          Neuro Assessment:   Neuro Checks:      Last Documented NIHSS Modified Score:   Has TPA been given?  If patient is a Neuro Trauma and patient is going to OR before floor call report to [place] nurse: [phone] or  [phone]   R Recommendations: See Admitting Provider Note  Report given to:   Additional Notes: pt disoriented x 4 / screams and pushes when touched / has hx of dementia but is abnormal behavior for him

## 2021-03-13 NOTE — ED Triage Notes (Signed)
Pt bib ems from [name] memory unit for increased agitation.

## 2021-03-13 NOTE — ED Notes (Signed)
Dr. Doutova at bedside.  

## 2021-03-13 NOTE — ED Notes (Signed)
[place] applied to pt with spouse's okay. Pt was taking off leads and pulling at lines. Pt placed back on cardiac monitoring and diaper changed.

## 2021-03-13 NOTE — ED Provider Notes (Signed)
[facility] DEPT Provider Note   CSN: [id number] Arrival date & time: 03/13/21  1459     History Chief Complaint  Patient presents with   Altered Mental Status   Agitation    [name] is a 80 y.o. male.  80 year old male with history of dementia presents due to altered mental status.  Patient has been noted to have chills and did not be himself x1 day.  Has had a mild cough without vomiting or diarrhea.  Patient was tempted to ambulate and fell to his knees but did not strike his head or pass out.  History is per his wife.  No reported new medications started.  No treatment use prior to arrival      Past Medical History:  Diagnosis Date   Asthma    CAD (coronary artery disease)    Cancer ([place]) 2016   prostate   Complication of anesthesia    CO2 high according to patient    Family history of ovarian cancer    GERD (gastroesophageal reflux disease)    History of melanoma    History of prostate cancer    HTN (hypertension)    Mitral valve prolapse    Osteoporosis    TIA (transient ischemic attack)    UTI (lower urinary tract infection)     Patient Active Problem List   Diagnosis Date Noted   Discoloration of nail 06/21/2019   Hammer toe, acquired 10/27/2018   Syncope 03/28/2018   Genetic testing 10/30/2017   Family history of ovarian cancer    History of prostate cancer    History of melanoma    Moderate dementia with behavioral disturbance ([place]) 03/17/2017   Hoarseness of voice 02/26/2017   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   Amnestic MCI (mild cognitive impairment with memory loss) 06/11/2016   Sleep behavior disorder, REM 06/11/2016   TIA due to embolism ([place]) 06/11/2016   History of heart artery stent 06/11/2016   Subungual malignant melanoma ([place]) 06/19/2015   Nonrheumatic aortic valve insufficiency 06/15/2015   Malignant melanoma of finger ([place]) 06/02/2015   Gastroesophageal reflux disease 01/10/2015   Mild persistent  asthma 01/10/2015   HLD (hyperlipidemia) 01/10/2015   ED (erectile dysfunction) 11/13/2014   Lower urinary tract symptoms (LUTS) 11/13/2014   Cancer of prostate with low recurrence risk (stage T1-2a and Gleason < 7 and PSA < 10) ([place]) 11/03/2014   Elevated prostate specific antigen (PSA) 09/30/2014   Asthma, moderate persistent, well-controlled 08/08/2014   Dyspnea 01/27/2014   Asthma, moderate persistent, poorly-controlled 01/27/2014   Hypotension, unspecified 07/15/2013   Hypotension 07/15/2013   Dyspnea and respiratory abnormality 07/13/2013   Dysphonia 05/08/2011   S/P MVR (mitral valve repair) 01/14/2011   HTN (hypertension)    Osteoporosis    CAD (coronary artery disease)    Mitral valve prolapse with severe mitral regurgitation    Diaphragm paralysis 02/09/2010   Hypertension, essential 02/05/2010   OSTEOPOROSIS 02/05/2010   CARDIAC MURMUR, SYSTOLIC 78/29/5621   COUGH 02/05/2010    Past Surgical History:  Procedure Laterality Date   APPENDECTOMY     CORONARY ANGIOPLASTY WITH STENT PLACEMENT  04/30/2010   PCI and stenting of proximal RCA   MITRAL VALVE REPAIR  06/13/2010   right mini thoracotomy for complex valvuloplasty with 1mm Memo ring annuloplasty - Dr. Roxy Manns   NAILBED REPAIR Right 05/02/2015   Procedure: BIOPSY NAIL BED RIGHT THUMB;  Surgeon: Daryll Brod, MD;  Location: [facility];  Service: Orthopedics;  Laterality: Right;  ANESTHESIA:  IV REGIONAL FAB   PROSTATECTOMY  2016       Family History  Problem Relation Age of Onset   Heart disease Father    Heart disease Mother    Cirrhosis Brother    Ulcers Brother    Ovarian cancer Other        d. 2    Social History   Tobacco Use   Smoking status: Never   Smokeless tobacco: Never  Vaping Use   Vaping Use: Never used  Substance Use Topics   Alcohol use: Not Currently    Comment: SOCIAL   Drug use: No    Home Medications Prior to Admission medications   Medication Sig Start Date End  Date Taking? Authorizing Provider  Ascorbic Acid (VITAMIN C) 1000 MG tablet Take 500 mg by mouth daily.    [provider]  aspirin 81 MG tablet Take 81 mg by mouth daily.    [provider]  atorvastatin (LIPITOR) 10 MG tablet TAKE 1 TABLET BY MOUTH  DAILY 06/14/19   Adrian Prows, MD  calcium carbonate (TUMS - DOSED IN MG ELEMENTAL CALCIUM) 500 MG chewable tablet Chew 1 tablet by mouth daily.    [provider]  Cholecalciferol (VITAJOY DAILY D GUMMIES) 25 MCG (1000 UT) CHEW Chew 2,000 Units by mouth.    [provider]  escitalopram (LEXAPRO) 20 MG tablet Take 1 tablet (20 mg total) by mouth daily. 02/14/21   Cameron Sprang, MD  Ketotifen Fumarate (May OP) Apply to eye.    [provider]  losartan (COZAAR) 50 MG tablet Take 50 mg by mouth daily.    [provider]  Melatonin-Pyridoxine (MELATIN PO) Take 3 mg by mouth. Takes at [employer], Historical, MD  Multiple Vitamins-Minerals (EQ MULTIVITAMINS ADULT GUMMY PO) Take by mouth daily.    [provider]  NON FORMULARY Take by mouth 3 (three) times daily. CBD oil BID    [provider]  polyethylene glycol powder (GLYCOLAX/MIRALAX) 17 GM/SCOOP powder Take 17 g by mouth daily. 01/08/21   [provider]  rivastigmine (EXELON) 4.6 mg/24hr Place 1 patch (4.6 mg total) onto the skin daily. 02/14/21   Cameron Sprang, MD    Allergies    Penicillins  Review of Systems   Review of Systems  Unable to perform ROS: Dementia   Physical Exam Updated Vital Signs BP (!) 150/85 (BP Location: Left Arm)   Pulse 97   Temp 99 F (37.2 C) (Axillary)   Resp (!) 21   Ht 1.727 m (5\' 8" )   Wt 77 kg   SpO2 96%   BMI 25.81 kg/m   Physical Exam Vitals and nursing note reviewed.  Constitutional:      General: He is not in acute distress.    Appearance: Normal appearance. He is well-developed. He is not toxic-appearing.  HENT:     Head: Normocephalic  and atraumatic.  Eyes:     General: Lids are normal.     Conjunctiva/sclera: Conjunctivae normal.     Pupils: Pupils are equal, round, and reactive to light.  Neck:     Thyroid: No thyroid mass.     Trachea: No tracheal deviation.  Cardiovascular:     Rate and Rhythm: Normal rate and regular rhythm.     Heart sounds: Normal heart sounds. No murmur heard.   No gallop.  Pulmonary:     Effort: Pulmonary effort is normal. No respiratory  distress.     Breath sounds: Normal breath sounds. No stridor. No decreased breath sounds, wheezing, rhonchi or rales.  Abdominal:     General: There is no distension.     Palpations: Abdomen is soft.     Tenderness: There is no abdominal tenderness. There is no rebound.  Musculoskeletal:        General: No tenderness. Normal range of motion.     Cervical back: Normal range of motion and neck supple.     Comments: Erythema noted to lower extremities concerning for cellulitis.  Skin:    General: Skin is warm and dry.     Findings: No abrasion or rash.  Neurological:     Mental Status: He is lethargic and disoriented.     GCS: GCS eye subscore is 3. GCS verbal subscore is 4. GCS motor subscore is 5.     Cranial Nerves: No cranial nerve deficit.     Sensory: No sensory deficit.     Motor: Tremor present.  Psychiatric:        Attention and Perception: He is inattentive.        Speech: Speech is delayed.    ED Results / Procedures / Treatments   Labs (all labs ordered are listed, but only abnormal results are displayed) Labs Reviewed  CULTURE, BLOOD (ROUTINE X 2)  CULTURE, BLOOD (ROUTINE X 2)  URINE CULTURE  RESP PANEL BY RT-PCR (FLU A&B, COVID) ARPGX2  LACTIC ACID, PLASMA  LACTIC ACID, PLASMA  COMPREHENSIVE METABOLIC PANEL  CBC WITH DIFFERENTIAL/PLATELET  PROTIME-INR  APTT  URINALYSIS, ROUTINE W REFLEX MICROSCOPIC    EKG None  Radiology No results found.  Procedures Procedures   Medications Ordered in ED Medications  lactated  ringers infusion (has no administration in time range)    ED Course  I have reviewed the triage vital signs and the nursing notes.  Pertinent labs & imaging results that were available during my care of the patient were reviewed by me and considered in my medical decision making (see chart for details).    MDM Rules/Calculators/A&P                          Patient treated with Tylenol for his fever. Patient with elevated lactate and code sepsis called.  Started on empiric antibiotics.  COVID and flu negative.  Urinalysis negative.  Chest x-ray without acute findings.  Abdominal CT negative for acute intra-abdominal process. Cellulitis likely source of patient's infection.  Plan is for patient to be admitted to the hospital  CRITICAL CARE Performed by: Leota Jacobsen Total critical care time: 55 minutes Critical care time was exclusive of separately billable procedures and treating other patients. Critical care was necessary to treat or prevent imminent or life-threatening deterioration. Critical care was time spent personally by me on the following activities: development of treatment plan with patient and/or surrogate as well as nursing, discussions with consultants, evaluation of patient's response to treatment, examination of patient, obtaining history from patient or surrogate, ordering and performing treatments and interventions, ordering and review of laboratory studies, ordering and review of radiographic studies, pulse oximetry and re-evaluation of patient's condition.  Final Clinical Impression(s) / ED Diagnoses Final diagnoses:  None    Rx / DC Orders ED Discharge Orders     None        Lacretia Leigh, MD 03/15/21 1556

## 2021-03-13 NOTE — Progress Notes (Signed)
A consult was received from an ED physician for cefepime and vancomycin per pharmacy dosing.  The patient's profile has been reviewed for ht/wt/allergies/indication/available labs.   A one time order has been placed for cefepime 2g IV x1 and vancomycin 1500mg  IV x1.  Further antibiotics/pharmacy consults should be ordered by admitting physician if indicated.                       Thank you,  Dimple Nanas, PharmD 03/13/2021 6:54 PM

## 2021-03-14 ENCOUNTER — Inpatient Hospital Stay (HOSPITAL_COMMUNITY)
Admit: 2021-03-14 | Discharge: 2021-03-14 | Disposition: A | Discharge status: Home / Self Care | Payer: Medicare Other | Attending: Internal Medicine | Admitting: Internal Medicine

## 2021-03-14 ENCOUNTER — Inpatient Hospital Stay (HOSPITAL_COMMUNITY): Payer: Medicare Other

## 2021-03-14 ENCOUNTER — Other Ambulatory Visit (HOSPITAL_COMMUNITY): Payer: Medicare Other

## 2021-03-14 DIAGNOSIS — L03115 Cellulitis of right lower limb: Secondary | ICD-10-CM | POA: Diagnosis present

## 2021-03-14 DIAGNOSIS — A419 Sepsis, unspecified organism: Secondary | ICD-10-CM

## 2021-03-14 DIAGNOSIS — R6520 Severe sepsis without septic shock: Secondary | ICD-10-CM

## 2021-03-14 DIAGNOSIS — Z7189 Other specified counseling: Secondary | ICD-10-CM

## 2021-03-14 DIAGNOSIS — K5900 Constipation, unspecified: Secondary | ICD-10-CM | POA: Diagnosis present

## 2021-03-14 DIAGNOSIS — M25559 Pain in unspecified hip: Secondary | ICD-10-CM | POA: Diagnosis present

## 2021-03-14 DIAGNOSIS — G9341 Metabolic encephalopathy: Secondary | ICD-10-CM | POA: Diagnosis present

## 2021-03-14 LAB — RESPIRATORY PANEL BY PCR

## 2021-03-14 LAB — COMPREHENSIVE METABOLIC PANEL
ALT: 20 U/L (ref 0–44)
AST: 30 U/L (ref 15–41)
Albumin: 3.6 g/dL (ref 3.5–5.0)
Alkaline Phosphatase: 64 U/L (ref 38–126)
Anion gap: 6 (ref 5–15)
BUN: 19 mg/dL (ref 8–23)
CO2: 26 mmol/L (ref 22–32)
Calcium: 8.6 mg/dL — ABNORMAL LOW (ref 8.9–10.3)
Chloride: 105 mmol/L (ref 98–111)
Creatinine, Ser: 0.93 mg/dL (ref 0.61–1.24)
GFR, Estimated: >60 mL/min (ref >60)
Glucose, Bld: 112 mg/dL — ABNORMAL HIGH (ref 70–99)
Potassium: 4 mmol/L (ref 3.5–5.1)
Sodium: 137 mmol/L (ref 135–145)
Total Bilirubin: 1.1 mg/dL (ref 0.3–1.2)
Total Protein: 6.1 g/dL — ABNORMAL LOW (ref 6.5–8.1)

## 2021-03-14 LAB — CBC WITH DIFFERENTIAL/PLATELET
Abs Immature Granulocytes: 0.03 10*3/uL (ref 0.00–0.07)
Basophils Absolute: 0.1 10*3/uL (ref 0.0–0.1)
Basophils Relative: 1 %
Eosinophils Absolute: 0.2 10*3/uL (ref 0.0–0.5)
Eosinophils Relative: 2 %
HCT: 39.9 % (ref 39.0–52.0)
Hemoglobin: 13.3 g/dL (ref 13.0–17.0)
Immature Granulocytes: 0 %
Lymphocytes Relative: 11 %
Lymphs Abs: 0.9 10*3/uL (ref 0.7–4.0)
MCH: 31.5 pg (ref 26.0–34.0)
MCHC: 33.3 g/dL (ref 30.0–36.0)
MCV: 94.5 fL (ref 80.0–100.0)
Monocytes Absolute: 1.1 10*3/uL — ABNORMAL HIGH (ref 0.1–1.0)
Monocytes Relative: 13 %
Neutro Abs: 5.9 10*3/uL (ref 1.7–7.7)
Neutrophils Relative %: 73 %
Platelets: 133 10*3/uL — ABNORMAL LOW (ref 150–400)
RBC: 4.22 MIL/uL (ref 4.22–5.81)
RDW: 13.7 % (ref 11.5–15.5)
WBC: 8.1 10*3/uL (ref 4.0–10.5)
nRBC: 0 % (ref 0.0–0.2)

## 2021-03-14 LAB — BLOOD GAS, VENOUS
Acid-Base Excess: 1.9 mmol/L (ref 0.0–2.0)
Bicarbonate: 28.1 mmol/L — ABNORMAL HIGH (ref 20.0–28.0)
O2 Saturation: 30.7 %
Patient temperature: 98.6
pCO2, Ven: 51.8 mmHg (ref 44.0–60.0)
pH, Ven: 7.353 (ref 7.250–7.430)
pO2, Ven: 19.4 mmHg — CL (ref 32.0–45.0)

## 2021-03-14 LAB — MAGNESIUM: Magnesium: 2.2 mg/dL (ref 1.7–2.4)

## 2021-03-14 LAB — URINE CULTURE: Culture: NO GROWTH

## 2021-03-14 LAB — PROCALCITONIN: Procalcitonin: <0.1 ng/mL

## 2021-03-14 LAB — TSH: TSH: 2.611 u[IU]/mL (ref 0.350–4.500)

## 2021-03-14 LAB — PHOSPHORUS: Phosphorus: 2.3 mg/dL — ABNORMAL LOW (ref 2.5–4.6)

## 2021-03-14 MED ORDER — SENNOSIDES-DOCUSATE SODIUM 8.6-50 MG PO TABS: 1.0000 | ORAL_TABLET | Freq: Two times a day (BID) | ORAL | Status: DC

## 2021-03-14 MED ORDER — CHLORHEXIDINE GLUCONATE 0.12 % MT SOLN
15.0000 mL | Freq: Two times a day (BID) | OROMUCOSAL | Status: DC
  Administered 2021-03-14 – 2021-03-21 (×11): 15 mL via OROMUCOSAL
  Filled 2021-03-14 (×13): qty 15

## 2021-03-14 MED ORDER — ALBUTEROL SULFATE (2.5 MG/3ML) 0.083% IN NEBU: 2.5000 mg | INHALATION_SOLUTION | Freq: Once | RESPIRATORY_TRACT | Status: DC

## 2021-03-14 MED ORDER — RIVASTIGMINE 4.6 MG/24HR TD PT24
4.6000 mg | MEDICATED_PATCH | Freq: Every day | TRANSDERMAL | Status: DC
  Administered 2021-03-14 – 2021-03-17 (×4): 4.6 mg via TRANSDERMAL
  Filled 2021-03-14 (×4): qty 1

## 2021-03-14 MED ORDER — ESCITALOPRAM OXALATE 20 MG PO TABS: 20.0000 mg | ORAL_TABLET | Freq: Every day | ORAL | Status: DC

## 2021-03-14 MED ORDER — DEXTROSE IN LACTATED RINGERS 5 % IV SOLN
INTRAVENOUS | Status: DC
Start: 2021-03-14 — End: 2021-03-17

## 2021-03-14 MED ORDER — ATORVASTATIN CALCIUM 10 MG PO TABS: 10.0000 mg | ORAL_TABLET | Freq: Every day | ORAL | Status: DC

## 2021-03-14 MED ORDER — ASPIRIN 81 MG PO CHEW: 81.0000 mg | CHEWABLE_TABLET | Freq: Every day | ORAL | Status: DC

## 2021-03-14 MED ORDER — BISACODYL 10 MG RE SUPP: 10.0000 mg | Freq: Every day | RECTAL | Status: DC

## 2021-03-14 MED ORDER — KETOROLAC TROMETHAMINE 30 MG/ML IJ SOLN: 30.0000 mg | Freq: Three times a day (TID) | INTRAMUSCULAR | Status: DC

## 2021-03-14 MED ORDER — SENNA 8.6 MG PO TABS: 1.0000 | ORAL_TABLET | Freq: Two times a day (BID) | ORAL | Status: DC

## 2021-03-14 MED ORDER — SIMETHICONE 40 MG/0.6ML PO SUSP
80.0000 mg | Freq: Every evening | ORAL | Status: DC | PRN
  Filled 2021-03-14: qty 1.2

## 2021-03-14 MED ORDER — ACETAMINOPHEN 325 MG PO TABS: 650.0000 mg | ORAL_TABLET | Freq: Four times a day (QID) | ORAL | Status: DC | PRN

## 2021-03-14 MED ORDER — ACETAMINOPHEN 650 MG RE SUPP
650.0000 mg | Freq: Four times a day (QID) | RECTAL | Status: DC | PRN
  Administered 2021-03-14: 650 mg via RECTAL
  Filled 2021-03-14: qty 1

## 2021-03-14 MED ORDER — ALBUTEROL SULFATE (2.5 MG/3ML) 0.083% IN NEBU: 2.5000 mg | INHALATION_SOLUTION | RESPIRATORY_TRACT | Status: DC | PRN

## 2021-03-14 MED ORDER — ORAL CARE MOUTH RINSE
15.0000 mL | Freq: Two times a day (BID) | OROMUCOSAL | Status: DC
  Administered 2021-03-15 – 2021-03-20 (×4): 15 mL via OROMUCOSAL

## 2021-03-14 MED ORDER — MILK AND MOLASSES ENEMA
1.0000 | Freq: Once | RECTAL | Status: AC
  Administered 2021-03-14: 240 mL via RECTAL
  Filled 2021-03-14: qty 240

## 2021-03-14 MED ORDER — METRONIDAZOLE 500 MG/100ML IV SOLN: 500.0000 mg | Freq: Two times a day (BID) | INTRAVENOUS | Status: DC

## 2021-03-14 MED ORDER — SODIUM CHLORIDE 0.9 % IV SOLN
75.0000 mL/h | INTRAVENOUS | Status: DC
  Administered 2021-03-14: 75 mL/h via INTRAVENOUS

## 2021-03-14 MED ORDER — SODIUM CHLORIDE 0.9 % IV SOLN
1.0000 g | Freq: Three times a day (TID) | INTRAVENOUS | Status: DC
  Administered 2021-03-14 – 2021-03-15 (×3): 1 g via INTRAVENOUS
  Filled 2021-03-14 (×4): qty 1

## 2021-03-14 MED ORDER — DOCUSATE SODIUM 100 MG PO CAPS: 100.0000 mg | ORAL_CAPSULE | Freq: Two times a day (BID) | ORAL | Status: DC

## 2021-03-14 MED ORDER — POLYETHYLENE GLYCOL 3350 17 G PO PACK: 17.0000 g | PACK | Freq: Every day | ORAL | Status: DC

## 2021-03-14 MED ORDER — LOSARTAN POTASSIUM 50 MG PO TABS: 50.0000 mg | ORAL_TABLET | Freq: Every day | ORAL | Status: DC

## 2021-03-14 MED ORDER — HYDROCODONE-ACETAMINOPHEN 5-325 MG PO TABS: 1.0000 | ORAL_TABLET | ORAL | Status: DC | PRN

## 2021-03-14 MED ORDER — LEVALBUTEROL HCL 0.63 MG/3ML IN NEBU
0.6300 mg | INHALATION_SOLUTION | Freq: Three times a day (TID) | RESPIRATORY_TRACT | Status: DC
  Administered 2021-03-14 (×2): 0.63 mg via RESPIRATORY_TRACT
  Filled 2021-03-14 (×2): qty 3

## 2021-03-14 MED ORDER — MORPHINE SULFATE (PF) 2 MG/ML IV SOLN
1.0000 mg | INTRAVENOUS | Status: DC | PRN
Start: 2021-03-14 — End: 2021-03-14

## 2021-03-14 MED ORDER — LORAZEPAM 2 MG/ML IJ SOLN
0.5000 mg | Freq: Four times a day (QID) | INTRAMUSCULAR | Status: DC | PRN
  Administered 2021-03-15 – 2021-03-17 (×2): 0.5 mg via INTRAVENOUS
  Filled 2021-03-14 (×2): qty 1

## 2021-03-14 MED ORDER — SODIUM CHLORIDE 0.9 % IV SOLN
2.0000 g | Freq: Three times a day (TID) | INTRAVENOUS | Status: DC
  Filled 2021-03-14: qty 2

## 2021-03-14 MED ORDER — BISACODYL 10 MG RE SUPP: 10.0000 mg | Freq: Every day | RECTAL | Status: DC | PRN

## 2021-03-14 MED ORDER — SODIUM CHLORIDE 0.9 % IV SOLN
2.0000 g | INTRAVENOUS | Status: DC
  Filled 2021-03-14: qty 20

## 2021-03-14 MED ORDER — LEVALBUTEROL HCL 0.63 MG/3ML IN NEBU
0.6300 mg | INHALATION_SOLUTION | Freq: Three times a day (TID) | RESPIRATORY_TRACT | Status: DC
Start: 2021-03-15 — End: 2021-03-20
  Administered 2021-03-15 – 2021-03-20 (×15): 0.63 mg via RESPIRATORY_TRACT
  Filled 2021-03-14 (×14): qty 3

## 2021-03-14 MED ORDER — MAGNESIUM SULFATE 2 GM/50ML IV SOLN
2.0000 g | Freq: Once | INTRAVENOUS | Status: AC
  Administered 2021-03-14: 2 g via INTRAVENOUS
  Filled 2021-03-14: qty 50

## 2021-03-14 MED ORDER — POTASSIUM PHOSPHATES 15 MMOLE/5ML IV SOLN
15.0000 mmol | Freq: Once | INTRAVENOUS | Status: AC
  Administered 2021-03-14: 15 mmol via INTRAVENOUS
  Filled 2021-03-14 (×2): qty 5

## 2021-03-14 MED ORDER — KETOROLAC TROMETHAMINE 15 MG/ML IJ SOLN
15.0000 mg | Freq: Four times a day (QID) | INTRAMUSCULAR | Status: DC
  Administered 2021-03-14 – 2021-03-18 (×18): 15 mg via INTRAVENOUS
  Filled 2021-03-14 (×18): qty 1

## 2021-03-14 MED ORDER — NAPHAZOLINE-GLYCERIN 0.012-0.25 % OP SOLN
1.0000 [drp] | Freq: Four times a day (QID) | OPHTHALMIC | Status: DC | PRN
Start: 2021-03-14 — End: 2021-03-23
  Administered 2021-03-14 (×2): 1 [drp] via OPHTHALMIC
  Filled 2021-03-14: qty 15

## 2021-03-14 NOTE — Progress Notes (Signed)
PT Cancellation Note  Patient Details Name: [name] MRN: [id number] DOB: 07/09/1940   Cancelled Treatment:    Reason Eval/Treat Not Completed: Fatigue/lethargy limiting ability to participate (RN reports pt obtunded and not alert enough to participate in therapy. Will follow up at later date/time as pt is able.)   Gwynneth Albright PT, [place] Office [phone] Pager [phone]

## 2021-03-14 NOTE — Assessment & Plan Note (Addendum)
Treated with IV vancomycin and Fortaz.  So far cultures are negative.  Resolution of doxycycline for a few more days.

## 2021-03-14 NOTE — Assessment & Plan Note (Signed)
Blood pressure stable.  Holding home regimen.

## 2021-03-14 NOTE — Progress Notes (Signed)
[facility] Progress Note  Patient: [name]    VQQ:[id number]  DOA: 03/13/2021    Date of Service: the patient was seen and examined on 03/14/2021  Brief hospital course: No notes on file  Assessment and Plan: * Severe sepsis ([place]) due to rhinovirus infection Presents with fall.  Found to have worsening confusion.  Also has fever tachycardia. Met SIRS criteria on admission.  With worsening mentation organ damage. Currently remains febrile. Tachypneic as well. Chest x-ray negative.  CT abdomen negative for any acute abnormality. Blood cultures so far pending but negative. Rhinovirus test is positive.  Influenza and COVID-negative. Continue close monitoring.  Lower threshold to transfer to ICU  Acute metabolic encephalopathy Secondary to sepsis.  Concern for serotonin syndrome although not able to meet any complete criteria. Currently mentation still not back to baseline.  At baseline the patient is able to ambulate, feed himself.  Intermittently also communicates in his home language. Monitor.  Concern for serotonin syndrome therefore avoiding all serotonergic medications.  Continue Ativan as needed. Holding Lexapro.  Cellulitis of right lower leg Treated with IV vancomycin and Fortaz.  Follow-up on cultures.  Moderate dementia with behavioral disturbance ([place]) Along with delirium. Currently monitoring.  On Exelon patch continue.  Asthma, moderate persistent, poorly-controlled Mild respiratory stress.  Continue with nebulizer therapy with Xopenex.  Goals of care, counseling/discussion Discussed with patient's wife at bedside.  Patient's condition is critical although appears to be stable and going in the right direction. At this juncture should the patient's condition worsens further, family would like to transition to comfort and hospice.  If the patient improves and A 4 to 48 hours directed continue current care plan of treat what is treatable.  Hip  pain Patient reports any pain while moving his right leg. X-ray had some concerns regarding hip fracture which was ruled out on CT scan.  Discussed with radiology personally.  HLD (hyperlipidemia) Continue statin once mentation improves.  HTN (hypertension) Blood pressure stable.  Holding home regimen.  Hypomagnesemia Being replaced with potassium.  Monitor.  Constipation Currently resolved with a bowel movement.  Monitor.  CAD (coronary artery disease) No evidence of acute ischemia.  Monitor.    Body mass index is 25.91 kg/m.        Subjective: Patient nonverbal.  Minimally responsive.  Unable to follow any commands.  No nausea no vomiting but had a fever intermittent tremors which improved as the day progressed.  Objective: Tachycardic, tachypneic and febrile.  Exam: General: Appear in mild distress, no Rash; Oral Mucosa Clear, moist. no Abnormal Neck Mass Or lumps, Conjunctiva normal  Cardiovascular: S1 and S2 Present, no Murmur, Respiratory: increased respiratory effort, Bilateral Air entry present and bilateral  Crackles, no wheezes Abdomen: Bowel Sound present, Soft and no tenderness Extremities: trace Pedal edema Neurology: Lethargic, nonverbal, unable to follow any commands. Gait not checked due to patient safety concerns     Data Reviewed: Rhinovirus positive.  Disposition:  Status is: Inpatient  Remains inpatient appropriate because: Severe mentation change, poor p.o. intake requiring IV fluids.   Family Communication: Family at bedside.  DVT Prophylaxis: SCDs Start: 03/14/21 0134   Time spent: 35 minutes.   Author: Berle Mull  03/14/2021 7:21 PM  To reach On-call, see care teams to locate the attending and reach out via [email or web address]. Between 7PM-7AM, please contact night-coverage If you still have difficulty reaching the attending provider, please page the [facility] (Director on Call) for [facility] on amion for assistance.

## 2021-03-14 NOTE — Assessment & Plan Note (Signed)
No evidence of acute ischemia.  Monitor.

## 2021-03-14 NOTE — Evaluation (Signed)
SLP Cancellation Note  Patient Details Name: [name] MRN: [id number] DOB: 04/12/1941   Cancelled treatment:       Reason Eval/Treat Not Completed: Other (comment) (pt now getting EEG per RN, will continue efforts)  .Kathleen Lime, MS [facility] SLP Acute Rehab Services Office [phone] Pager [phone]   [name] 03/14/2021, 3:42 PM

## 2021-03-14 NOTE — Procedures (Signed)
Patient Name: [name]  MRN: [id number]  Epilepsy Attending: Lora Havens  Referring Physician/Provider: Dr Berle Mull Date: 03/14/2021 Duration: 22.24 mins  Patient history: 80yo M with ams. EEG to evaluate for seizure  Level of alertness:  lethargic   AEDs during EEG study: None  Technical aspects: This EEG study was done with scalp electrodes positioned according to the 10-20 International system of electrode placement. Electrical activity was acquired at a sampling rate of 500Hz  and reviewed with a high frequency filter of 70Hz  and a low frequency filter of 1Hz . EEG data were recorded continuously and digitally stored.   Description: EEG showed continuous generalized 3 to 6 Hz theta-delta slowing, at times with triphasic morphology.  Hyperventilation and photic stimulation were not performed.     ABNORMALITY - Continuous slow, generalized  IMPRESSION: This study is suggestive of moderate diffuse encephalopathy, nonspecific etiology but could be secondary toxic-metabolic etiology, anoxic/hypoxic brain injury. No seizures or epileptiform discharges were seen throughout the recording.  Shoichi Mielke Barbra Sarks

## 2021-03-14 NOTE — Assessment & Plan Note (Signed)
Patient reports any pain while moving his right leg. X-ray had some concerns regarding hip fracture which was ruled out on CT scan.  Discussed with radiology personally.

## 2021-03-14 NOTE — Assessment & Plan Note (Signed)
Continue statin once mentation improves.

## 2021-03-14 NOTE — Plan of Care (Signed)
  Problem: Education: Goal: Knowledge of General Education information will improve Description Including pain rating scale, medication(s)/side effects and non-pharmacologic comfort measures Outcome: Progressing   

## 2021-03-14 NOTE — Assessment & Plan Note (Signed)
Currently resolved with a bowel movement.  Monitor.

## 2021-03-14 NOTE — Progress Notes (Signed)
Patient had a large brown type 4 bowel movement, incontinent in bed.  Angie Fava, RN

## 2021-03-14 NOTE — Assessment & Plan Note (Addendum)
Presents with fall.  Found to have worsening confusion.  Also has fever tachycardia. Met SIRS criteria on admission.  With worsening mentation organ damage. Fever improving with scheduled Toradol. Tachypneic as well. Chest x-ray negative.  CT abdomen negative for any acute abnormality. Blood cultures so far negative. Rhinovirus test is positive.  Influenza and COVID-negative. Continue close monitoring.

## 2021-03-14 NOTE — Evaluation (Addendum)
SLP Cancellation Note  Patient Details Name: [name] MRN: [id number] DOB: 10-12-1940   Cancelled treatment:       Reason Eval/Treat Not Completed: Other (comment) (pt getting assistance from RN for clean up, now red MEWS, RN reports family stated pt lethargic this am and is now NPO, pt has been lethargic or agitated per notes, will continue efforts)  Kathleen Lime, MS [place] Office [phone] Pager [phone]  [name] 03/14/2021, 12:06 PM

## 2021-03-14 NOTE — Assessment & Plan Note (Addendum)
Replaced. °

## 2021-03-14 NOTE — Assessment & Plan Note (Addendum)
Discussed with patient's wife at bedside.  Patient's condition is critical although appears to be stable and going in the right direction. At this juncture should the patient's condition worsens further, family would like to transition to comfort and hospice.  If the patient improves, continue current care plan of treat what is treatable.

## 2021-03-14 NOTE — Assessment & Plan Note (Signed)
Mild respiratory stress.  Continue with nebulizer therapy with Xopenex.

## 2021-03-14 NOTE — Progress Notes (Signed)
EEG complete - results pending 

## 2021-03-14 NOTE — Progress Notes (Signed)
OT Cancellation Note  Patient Details Name: [name] MRN: [id number] DOB: May 29, 1940   Cancelled Treatment:    Reason Eval/Treat Not Completed: Patient's level of consciousness (agitation, painful when touched, not recognizing family at this time). RN asked therapy to hold at this time, will attempt tomorrow.  Merri Ray Ruperto Kiernan 03/14/2021, 11:46 AM  Jesse Sans OTR/L Acute Rehabilitation Services Pager: [phone] Office: [phone]

## 2021-03-14 NOTE — Assessment & Plan Note (Addendum)
Secondary to sepsis. Currently mentation still not back to baseline.  At baseline the patient is able to ambulate, feed himself.  Intermittently also communicates in his home language. Concern for serotonin syndrome therefore avoiding all serotonergic medications.Holding Lexapro. Continue Ativan as needed.

## 2021-03-14 NOTE — Progress Notes (Addendum)
Pharmacy Antibiotic Note  [name] is a 80 y.o. male admitted on 03/13/2021 with medical history significant of  Asthma, CAD, prostate cancer, GERD, HTN, TIA. Patient presented with increased agitation and fever.  Pharmacy has been consulted to dose vancomycin and cefepime for cellulitis.  1st doses given in ED. Md to change flagyl/cefepime to ceftazidime/flagyl. Md trying to stay away from cefepime due to AMS. Continue vancomycin  Plan: Continue vancomycin 1250mg  IV q24h (AUC 455.2, Scr 1.02) Start Ceftazidime 1g IV q8 Flagyl per Md Follow renal function and clinical course  Height: 5\' 8"  (172.7 cm) Weight: 77.3 kg (170 lb 6.7 oz) IBW/kg (Calculated) : 68.4  Temp (24hrs), Avg:99.1 F (37.3 C), Min:97.4 F (36.3 C), Max:101.4 F (38.6 C)  Recent Labs  Lab 03/13/21 1715 03/13/21 2314 03/14/21 0401  WBC 12.7*  --  8.1  CREATININE 1.02  --  0.93  LATICACIDVEN 3.6* 1.4  --      Estimated Creatinine Clearance: 61.3 mL/min (by C-G formula based on SCr of 0.93 mg/dL).    Allergies  Allergen Reactions   Aricept [Donepezil]     Upset stomach   Depakote [Divalproex Sodium]     Made me feel like a different person   Penicillins Itching and Rash    Has patient had a PCN reaction causing immediate rash, facial/tongue/throat swelling, SOB or lightheadedness with hypotension: YES Has patient had a PCN reaction causing severe rash involving mucus membranes or skin necrosis: NO Has patient had a PCN reaction that required hospitalization: NO Has patient had a PCN reaction occurring within the last 10 years: NO If all of the above answers are "NO", then may proceed with Cephalosporin use.    Antimicrobials this admission: 11/29 vanc >> 11/29 cefepime >> 11/30 11/29 flagyl >>  11/30 ceftaz >>   Dose adjustments this admission:   Microbiology results: 11/29 BCx:  11/29 UCx:   Thank you for allowing pharmacy to be a part of this patient's care.  Adrian Saran,  PharmD, BCPS Secure Chat if ?s 03/14/2021 10:51 AM

## 2021-03-14 NOTE — Progress Notes (Signed)
PHARMACY NOTE:  ANTIMICROBIAL RENAL DOSAGE ADJUSTMENT  Current antimicrobial regimen includes a mismatch between antimicrobial dosage and estimated renal function.  As per policy approved by the Pharmacy & Therapeutics and Medical Executive Committees, the antimicrobial dosage will be adjusted accordingly.  Current antimicrobial dosage:  cefepime 2g IV q12  Indication: cellulitis   Renal Function:  Estimated Creatinine Clearance: 61.3 mL/min (by C-G formula based on SCr of 0.93 mg/dL). []      On intermittent HD, scheduled: []      On CRRT    Antimicrobial dosage has been changed to:  cefepime 2g IV q8  Additional comments:   Thank you for allowing pharmacy to be a part of this patient's care.  Kara Mead, [facility] 03/14/2021 8:47 AM

## 2021-03-14 NOTE — Assessment & Plan Note (Addendum)
Along with delirium. Currently monitoring.  On Exelon patch continue. Speech will see the patient for swallow evaluation patient is still unable to follow commands So not sure if he can eating safely.

## 2021-03-15 ENCOUNTER — Inpatient Hospital Stay (HOSPITAL_COMMUNITY): Payer: Medicare Other

## 2021-03-15 ENCOUNTER — Telehealth: Payer: Self-pay

## 2021-03-15 LAB — COMPREHENSIVE METABOLIC PANEL
ALT: 23 U/L (ref 0–44)
AST: 41 U/L (ref 15–41)
Albumin: 3.2 g/dL — ABNORMAL LOW (ref 3.5–5.0)
Alkaline Phosphatase: 57 U/L (ref 38–126)
Anion gap: 4 — ABNORMAL LOW (ref 5–15)
BUN: 18 mg/dL (ref 8–23)
CO2: 24 mmol/L (ref 22–32)
Calcium: 8 mg/dL — ABNORMAL LOW (ref 8.9–10.3)
Chloride: 106 mmol/L (ref 98–111)
Creatinine, Ser: 1.07 mg/dL (ref 0.61–1.24)
GFR, Estimated: >60 mL/min (ref >60)
Glucose, Bld: 122 mg/dL — ABNORMAL HIGH (ref 70–99)
Potassium: 3.6 mmol/L (ref 3.5–5.1)
Sodium: 134 mmol/L — ABNORMAL LOW (ref 135–145)
Total Bilirubin: 0.8 mg/dL (ref 0.3–1.2)
Total Protein: 5.7 g/dL — ABNORMAL LOW (ref 6.5–8.1)

## 2021-03-15 LAB — CBC WITH DIFFERENTIAL/PLATELET
Abs Immature Granulocytes: 0.03 10*3/uL (ref 0.00–0.07)
Basophils Absolute: 0 10*3/uL (ref 0.0–0.1)
Basophils Relative: 0 %
Eosinophils Absolute: 0.1 10*3/uL (ref 0.0–0.5)
Eosinophils Relative: 2 %
HCT: 39.1 % (ref 39.0–52.0)
Hemoglobin: 12.9 g/dL — ABNORMAL LOW (ref 13.0–17.0)
Immature Granulocytes: 0 %
Lymphocytes Relative: 13 %
Lymphs Abs: 1.1 10*3/uL (ref 0.7–4.0)
MCH: 31.2 pg (ref 26.0–34.0)
MCHC: 33 g/dL (ref 30.0–36.0)
MCV: 94.4 fL (ref 80.0–100.0)
Monocytes Absolute: 1.4 10*3/uL — ABNORMAL HIGH (ref 0.1–1.0)
Monocytes Relative: 16 %
Neutro Abs: 6.1 10*3/uL (ref 1.7–7.7)
Neutrophils Relative %: 69 %
Platelets: 125 10*3/uL — ABNORMAL LOW (ref 150–400)
RBC: 4.14 MIL/uL — ABNORMAL LOW (ref 4.22–5.81)
RDW: 14.1 % (ref 11.5–15.5)
WBC: 8.8 10*3/uL (ref 4.0–10.5)
nRBC: 0 % (ref 0.0–0.2)

## 2021-03-15 LAB — MAGNESIUM: Magnesium: 2.1 mg/dL (ref 1.7–2.4)

## 2021-03-15 MED ORDER — DOXYCYCLINE HYCLATE 100 MG PO TABS
100.0000 mg | ORAL_TABLET | Freq: Two times a day (BID) | ORAL | Status: DC
  Administered 2021-03-15 – 2021-03-17 (×4): 100 mg via ORAL
  Filled 2021-03-15 (×5): qty 1

## 2021-03-15 NOTE — Progress Notes (Signed)
Initial Nutrition Assessment RD working remotely.   DOCUMENTATION CODES:   Not applicable  INTERVENTION:  - diet advancement as medically feasible per SLP recommendations. - complete NFPE when feasible.   NUTRITION DIAGNOSIS:   Inadequate oral intake related to decreased appetite, inability to eat as evidenced by per patient/family report, NPO status.  GOAL:   Patient will meet greater than or equal to 90% of their needs  MONITOR:   Diet advancement, Labs, Weight trends  REASON FOR ASSESSMENT:   Consult Assessment of nutrition requirement/status  ASSESSMENT:   80 year old male with medical history of HTN, GERD, CAD, TIA, asthma, osteoporosis, mitral valve prolapse s/p repair, and prostate cancer. He presented to the ED due to fever, agitation, and s/p fall. It was also reported that he has had decreased PO intake recently.  Patient is noted to be a/o to self only.   Diet advanced from NPO to Dysphagia 2, thin liquids yesterday at 0759 and then changed to NPO, allowed meds in applesauce today at 0824 today.  RN note from today at 1237 states that patient took meds crushed in yogurt and tolerated this without coughing or choking and that patient expressed that he is eager to eat.   SLP was unable to work with patient yesterday d/t EEG. OT unable to work with patient today d/t agitation.   He has not been seen by a [place] RD at any time in the past.   Weight yesterday was 170 lb and weight has been stable since 05/31/20. Mild pitting edema to BLE documented in the edema section of flow sheet.   Per notes: - severe sepsis 2/2 rhinovirus infection - met SIRS criteria on admission - acute metabolic encephalopathy - cellulitis of lower RLE - dementia with behavioral disturbances - ongoing [place] decisions    Labs reviewed; Na: 134 mmol/l, C: 8 mg/dl. Medications reviewed. IVF; D5-LR @ 100 ml/hr (408 kcal/24 hrs).     NUTRITION - FOCUSED PHYSICAL EXAM:  RD  working remotely.   Diet Order:   Diet Order             Diet NPO time specified Except for: Ice Chips, Sips with Meds  Diet effective now                   EDUCATION NEEDS:   No education needs have been identified at this time  Skin:  Skin Assessment: Reviewed RN Assessment  Last BM:  11/30 per nursing flow sheet documentation  Height:   Ht Readings from Last 1 Encounters:  03/13/21 5' 8"  (1.727 m)    Weight:   Wt Readings from Last 1 Encounters:  03/14/21 77.3 kg     Estimated Nutritional Needs:  Kcal:  1750-1950 kcal Protein:  85-100 grams Fluid:  >/= 1.8 L/day      Jarome Matin, MS, RD, LDN, CNSC Inpatient Clinical Dietitian RD pager # available in AMION  After hours/weekend pager # available in [facility]

## 2021-03-15 NOTE — Progress Notes (Signed)
Patient took doxycycline pill crushed in yogurt.  Wife, [name], fed patient medication in 90 mL of yogurt.  Patient did not choke, cough, or show distress while eating.  Patient was eager to eat.  MD aware.  Angie Fava, RN

## 2021-03-15 NOTE — Progress Notes (Signed)
[facility] Progress Note  Patient: [name]    EMV:[id number]  DOA: 03/13/2021    Date of Service: the patient was seen and examined on 03/15/2021  Brief hospital course: 11/29, admitted with worsening mentation, fever and sepsis. 11/30 RVP positive for rhinovirus. 12/1 mentation improving, not able to follow commands.  Speech evaluation still pending.   Assessment and Plan: * Severe sepsis ([place]) due to rhinovirus infection Presents with fall.  Found to have worsening confusion.  Also has fever tachycardia. Met SIRS criteria on admission.  With worsening mentation organ damage. Fever improving with scheduled Toradol. Tachypneic as well. Chest x-ray negative.  CT abdomen negative for any acute abnormality. Blood cultures so far negative. Rhinovirus test is positive.  Influenza and COVID-negative. Continue close monitoring.  Acute metabolic encephalopathy Secondary to sepsis. Currently mentation still not back to baseline.  At baseline the patient is able to ambulate, feed himself.  Intermittently also communicates in his home language. Concern for serotonin syndrome therefore avoiding all serotonergic medications.Holding Lexapro. Continue Ativan as needed.  Cellulitis of right lower leg Treated with IV vancomycin and Fortaz.  So far cultures are negative.  Resolution of doxycycline for a few more days.  Moderate dementia with behavioral disturbance ([place]) Along with delirium. Currently monitoring.  On Exelon patch continue. Speech will see the patient for swallow evaluation patient is still unable to follow commands So not sure if he can eating safely.  Asthma, moderate persistent, poorly-controlled Mild respiratory stress.  Continue with nebulizer therapy with Xopenex.  Goals of care, counseling/discussion Discussed with patient's wife at bedside.  Patient's condition is critical although appears to be stable and going in the right direction. At this juncture  should the patient's condition worsens further, family would like to transition to comfort and hospice.  If the patient improves, continue current care plan of treat what is treatable.  Hip pain Patient reports any pain while moving his right leg. X-ray had some concerns regarding hip fracture which was ruled out on CT scan.  Discussed with radiology personally.  HLD (hyperlipidemia) Continue statin once mentation improves.  HTN (hypertension) Blood pressure stable.  Holding home regimen.  Hypomagnesemia Replaced  Constipation Currently resolved with a bowel movement.  Monitor.  CAD (coronary artery disease) No evidence of acute ischemia.  Monitor.  Hypophosphatemia Replaced     Body mass index is 25.91 kg/m.  Nutrition Problem: Inadequate oral intake Etiology: decreased appetite, inability to eat     Subjective: Unable to follow any commands.  Still delirious.  Unable to carry out meaningful conversation although speaking in full sentences.  Still short of breath.  Objective: Tachypneic and tachycardic.  Exam: General: Appear in mild distress, no Rash; Oral Mucosa Clear, moist. no Abnormal Neck Mass Or lumps, Conjunctiva normal  Cardiovascular: S1 and S2 Present, no Murmur, Respiratory: increased respiratory effort, Bilateral Air entry present and bilateral  Crackles, no wheezes Abdomen: Bowel Sound present, Soft and no tenderness Extremities: trace Pedal edema Neurology: alert and not oriented to time, place, and person affect appropriate. no new focal deficit Gait not checked due to patient safety concerns    Data Reviewed: My review of labs, imaging, notes and other tests shows no new significant findings.   Disposition:  Status is: Inpatient  Remains inpatient appropriate because: Ongoing mentation.  Poor p.o. intake.  Still NPO.   Family Communication: Family at bedside.  Discussed with daughter as well.  DVT Prophylaxis: SCDs Start: 03/14/21  0134   Time spent:  35 minutes.   Author: Berle Mull  03/15/2021 5:05 PM  To reach On-call, see care teams to locate the attending and reach out via [email or web address]. Between 7PM-7AM, please contact night-coverage If you still have difficulty reaching the attending provider, please page the [facility] (Director on Call) for [facility] on amion for assistance.

## 2021-03-15 NOTE — Evaluation (Signed)
SLP Cancellation Note  Patient Details Name: AKSHAJ BESANCON MRN: [id number] DOB: [birth year]   Cancelled treatment:       Reason Eval/Treat Not Completed: Other (comment);Patient at procedure or test/unavailable (pt having echo completed at this time, will return this afternoon/evening.)  Kathleen Lime, MS [place] Office [phone] Pager [phone]  [name] 03/15/2021, 4:20 PM

## 2021-03-15 NOTE — Progress Notes (Signed)
[facility] [address] [facility]) Hospital Liaison note:  This patient is currently enrolled in [facility] outpatient-based Palliative Care. Will continue to follow for disposition.  Please call with any outpatient palliative questions or concerns.  Thank you, Lorelee Market, LPN [facility] Liaison [phone]

## 2021-03-15 NOTE — Assessment & Plan Note (Signed)
Replaced. °

## 2021-03-15 NOTE — Telephone Encounter (Signed)
Received VM from patient's daughter to make Palliative care aware that patient is currently admitted to [facility]. Hospital liaisons made aware and requested that daughter be contacted with updates.

## 2021-03-15 NOTE — Evaluation (Signed)
Physical Therapy Evaluation Patient Details Name: [name] MRN: [id number] DOB: 03-24-1941 Today's Date: 03/15/2021  History of Present Illness  Patiet is an 80 y.o. male presented from [facility] memory unit for increased agitation, confusion, fever, and fall at facility. Pt admitted for sepsis with encephalopathy 2/2 rhinovirus. PMH significant for Asthma, CAD, prostate cancer, GERD, HTN, osteoporosis, TIA status post mitral valve repair.   Clinical Impression  [name] is 80 y.o. male admitted with above HPI and diagnosis. Patient is currently limited by functional impairments below (see PT problem list). PTA pt was independent with no device for mobility at ALF (memory unit) per pt's wife. He is able to follow cues/commands and cooperative at baseline. Evaluation limited by agitation with mobility and pt confused throughout session. RN assist with MAX supine<>sit transfer and pt unsafe to stand this date due to agitation and confusion. Patient will benefit from continued skilled PT interventions to address impairments and progress independence with mobility, recommending SNF for rehab vs. Return to memory care unit if pt's mobility and mentation improves throughout stay. Acute PT will follow and progress as able.        Recommendations for follow up therapy are one component of a multi-disciplinary discharge planning process, led by the attending physician.  Recommendations may be updated based on patient status, additional functional criteria and insurance authorization.  Follow Up Recommendations Skilled nursing-short term rehab (<3 hours/day)    Assistance Recommended at Discharge Frequent or constant Supervision/Assistance  Functional Status Assessment Patient has had a recent decline in their functional status and demonstrates the ability to make significant improvements in function in a reasonable and predictable amount of time.  Equipment Recommendations  None  recommended by PT    Recommendations for Other Services       Precautions / Restrictions Precautions Precautions: Fall Restrictions Weight Bearing Restrictions: No      Mobility  Bed Mobility Overal bed mobility: Needs Assistance Bed Mobility: Supine to Sit;Sit to Supine     Supine to sit: Max assist;+2 for safety/equipment Sit to supine: +2 for physical assistance;+2 for safety/equipment   General bed mobility comments: MAX encouragement to initiate supine to sit. Pt began bringing LE's off EOB with tactile cue to sit upright but MAX +2 assist still needed for safety, Mod physical assist. Pt resistant to transfer sit<>stand and then resitant to return to supine. MAX+2 assist to raise LE's and control lowering to supine.    Transfers                        Ambulation/Gait                  Stairs            Wheelchair Mobility    Modified Rankin (Stroke Patients Only)       Balance Overall balance assessment: Needs assistance Sitting-balance support: Feet supported;Bilateral upper extremity supported Sitting balance-Leahy Scale: Poor                                       Pertinent Vitals/Pain Pain Assessment: PAINAD Breathing: occasional labored breathing, short period of hyperventilation Negative Vocalization: occasional moan/groan, low speech, negative/disapproving quality Facial Expression: sad, frightened, frown Body Language: rigid, fists clenched, knees up, pushing/pulling away, strikes out Consolability: distracted or reassured by voice/touch PAINAD Score: 6 Pain Intervention(s): Limited activity within patient's  tolerance;Monitored during session;Repositioned    Home Living Family/patient expects to be discharged to:: Skilled nursing facility                   Additional Comments: Return to memory care unit or SNF for 24/7 assist and follow up PT for mobility impairments.    Prior Function Prior Level of  Function : Needs assist  Cognitive Assist : ADLs (cognitive);Mobility (cognitive) Mobility (Cognitive): Intermittent cues ADLs (Cognitive): Step by step cues Physical Assist : Mobility (physical);ADLs (physical) Mobility (physical): Gait;Transfers ADLs (physical): Feeding;Bathing;Grooming;Dressing;Toileting;IADLs Mobility Comments: stand by assist without device ADLs Comments: pt needs assist from personal aid     Hand Dominance        Extremity/Trunk Assessment   Upper Extremity Assessment Upper Extremity Assessment: Defer to OT evaluation    Lower Extremity Assessment Lower Extremity Assessment: Generalized weakness;Difficult to assess due to impaired cognition    Cervical / Trunk Assessment Cervical / Trunk Assessment: Normal  Communication   Communication: Expressive difficulties;Prefers language other than English (pt speaking in different language at times, confused)  Cognition Arousal/Alertness: Awake/alert Behavior During Therapy: Restless;Agitated Overall Cognitive Status: History of cognitive impairments - at baseline Area of Impairment: Following commands;Safety/judgement;Awareness;Attention                   Current Attention Level: Sustained   Following Commands: Follows one step commands inconsistently;Follows one step commands with increased time Safety/Judgement: Decreased awareness of deficits;Decreased awareness of safety Awareness: Intellectual   General Comments: pt with dementai at baseline however typically able to follow commands and not agitated at baseline.        General Comments      Exercises     Assessment/Plan    PT Assessment Patient needs continued PT services  PT Problem List Decreased strength;Decreased activity tolerance;Decreased balance;Decreased mobility;Decreased cognition;Decreased knowledge of use of DME;Decreased safety awareness;Decreased knowledge of precautions       PT Treatment Interventions Gait  training;DME instruction;Functional mobility training;Therapeutic activities;Therapeutic exercise;Neuromuscular re-education;Balance training;Patient/family education    PT Goals (Current goals can be found in the Care Plan section)  Acute Rehab PT Goals Patient Stated Goal: get back to baseline mobility PT Goal Formulation: With family Time For Goal Achievement: 03/29/21 Potential to Achieve Goals: Good    Frequency Min 2X/week   Barriers to discharge        Co-evaluation               AM-PAC PT "6 Clicks" Mobility  Outcome Measure Help needed turning from your back to your side while in a flat bed without using bedrails?: Total Help needed moving from lying on your back to sitting on the side of a flat bed without using bedrails?: Total Help needed moving to and from a bed to a chair (including a wheelchair)?: Total Help needed standing up from a chair using your arms (e.g., wheelchair or bedside chair)?: Total Help needed to walk in hospital room?: Total Help needed climbing 3-5 steps with a railing? : Total 6 Click Score: 6    End of Session Equipment Utilized During Treatment: Gait belt Activity Tolerance: Treatment limited secondary to agitation Patient left: in bed;with call bell/phone within reach;with family/visitor present;with nursing/sitter in room Nurse Communication: Mobility status PT Visit Diagnosis: Other abnormalities of gait and mobility (R26.89);Muscle weakness (generalized) (M62.81);Difficulty in walking, not elsewhere classified (R26.2);Other symptoms and signs involving the nervous system (R29.898)    Time: 2130-8657 PT Time Calculation (min) (ACUTE ONLY): 32 min  Charges:   PT Evaluation $PT Eval Moderate Complexity: 1 Mod PT Treatments $Therapeutic Activity: 8-22 mins        Verner Mould, DPT Acute Rehabilitation Services Office [phone] Pager [phone]   Jacques Navy 03/15/2021, 2:50 PM

## 2021-03-15 NOTE — Progress Notes (Signed)
  Echocardiogram 2D Echocardiogram has been performed.  [name] 03/15/2021, 4:51 PM

## 2021-03-15 NOTE — Progress Notes (Signed)
BSE completed, full report to follow.  Pt awake in bed initially with wife present.  Pt appears easily agitated with being slid up in bed and with cranial nerve evaluation attempts - waving his hand at SLP and shaking his head.  He did not open his mouth or follow directions for oral motor exam even with encouragement from his wife.    Pt's voice was clear, however pt appears with congested breathing within a few minutes of HOB elevation - concerning for secretion retention.  During most of the session, pt had his eyes closed and barely opened his mouth *if at all* to accept small amounts of intake *nectar thick juice, water, applesauce from his wife.   Significantly delayed swallow noted inconsistently - with pt often not eliciting swallow despite verbal/visual cues from his wife/SLP and even dry spoon pressure to tongue to trigger motor system.  Question pharyngeal sensory deficits given dementia and/or secretion retention. Congested cough x2 observed with po attempts concerning for airway infiltration of small amount of po provided.    At this time, pt's mentation, decreased participation, congested cough without clearance (sounding as if at larynx/pharynx) and clinical indications of dysphagia, preclude pt from consuming po due to significant aspiration risk.    Recommend pt continue npo x tsps of thin water when pt fully alert and accepting ONLY.  Needed medications with puree.  Pt will not participate in MBS and thus clinically evaluations/diagnostic treatment indicated.    Wife denies pt coughing with po prior to admit, thus hopefully with improved medical condition and mentation, his dysphagia will improve.  Advised pt's wife and his daughter *who arrived later in the session* to sequale of dysphagia with dementia and need to modify based on pt's condition/status.  Goal of SLP is for aspiration/dysphagia mitigation - not definitive aspiration prevention.   Will follow up next date *mid  morning* for observation of pt consumption *out of pt's view - as SLPs presence appears to agitate pt and decrease his participation. All parties agreeable to plan.   Kathleen Lime, MS [facility] SLP Acute Rehab Services Office [phone] Pager [phone]

## 2021-03-15 NOTE — Progress Notes (Signed)
OT Cancellation Note  Patient Details Name: [name] MRN: [id number] DOB: [birth year]   Cancelled Treatment:    Reason Eval/Treat Not Completed: Patient declined, no reason specified: Pt just back to supine after physical therapy visit per pt's spouse. Pt with increasing agitation with prompts to mobilize and would not allow his mittens to be removed to test UEs or hands. Pt stating, "Yes I want to walk", but then not moving and not allow OT to assist. Noted increased agitation with encouragement therefore terminated OT Evaluation for patient/therapist safety.  Will continue attempts.   Julien Girt 03/15/2021, 11:40 AM

## 2021-03-15 NOTE — Hospital Course (Addendum)
11/29, admitted with worsening mentation, fever and sepsis. 11/30 RVP positive for rhinovirus. 12/1 mentation improving, not able to follow commands.  Speech evaluation still pending. 12/3, mentation waxing and waning.  Palliative care consulted.  Transition to complete comfort. 12/4 family meeting with palliative care and myself. Plan discussed is to go back to [name] with hospice support.

## 2021-03-16 LAB — COMPREHENSIVE METABOLIC PANEL
ALT: 27 U/L (ref 0–44)
AST: 47 U/L — ABNORMAL HIGH (ref 15–41)
Albumin: 3.4 g/dL — ABNORMAL LOW (ref 3.5–5.0)
Alkaline Phosphatase: 59 U/L (ref 38–126)
Anion gap: 4 — ABNORMAL LOW (ref 5–15)
BUN: 21 mg/dL (ref 8–23)
CO2: 26 mmol/L (ref 22–32)
Calcium: 8.3 mg/dL — ABNORMAL LOW (ref 8.9–10.3)
Chloride: 107 mmol/L (ref 98–111)
Creatinine, Ser: 1.09 mg/dL (ref 0.61–1.24)
GFR, Estimated: >60 mL/min (ref >60)
Glucose, Bld: 131 mg/dL — ABNORMAL HIGH (ref 70–99)
Potassium: 4.2 mmol/L (ref 3.5–5.1)
Sodium: 137 mmol/L (ref 135–145)
Total Bilirubin: 0.9 mg/dL (ref 0.3–1.2)
Total Protein: 6.2 g/dL — ABNORMAL LOW (ref 6.5–8.1)

## 2021-03-16 LAB — CBC WITH DIFFERENTIAL/PLATELET
Abs Immature Granulocytes: 0.05 10*3/uL (ref 0.00–0.07)
Basophils Absolute: 0 10*3/uL (ref 0.0–0.1)
Basophils Relative: 0 %
Eosinophils Absolute: 0 10*3/uL (ref 0.0–0.5)
Eosinophils Relative: 0 %
HCT: 41.6 % (ref 39.0–52.0)
Hemoglobin: 13.9 g/dL (ref 13.0–17.0)
Immature Granulocytes: 1 %
Lymphocytes Relative: 10 %
Lymphs Abs: 0.8 10*3/uL (ref 0.7–4.0)
MCH: 31.7 pg (ref 26.0–34.0)
MCHC: 33.4 g/dL (ref 30.0–36.0)
MCV: 95 fL (ref 80.0–100.0)
Monocytes Absolute: 1.2 10*3/uL — ABNORMAL HIGH (ref 0.1–1.0)
Monocytes Relative: 14 %
Neutro Abs: 6.1 10*3/uL (ref 1.7–7.7)
Neutrophils Relative %: 75 %
Platelets: 123 10*3/uL — ABNORMAL LOW (ref 150–400)
RBC: 4.38 MIL/uL (ref 4.22–5.81)
RDW: 14.1 % (ref 11.5–15.5)
WBC: 8.1 10*3/uL (ref 4.0–10.5)
nRBC: 0 % (ref 0.0–0.2)

## 2021-03-16 LAB — MAGNESIUM: Magnesium: 2.1 mg/dL (ref 1.7–2.4)

## 2021-03-16 MED ORDER — LOSARTAN POTASSIUM 50 MG PO TABS
50.0000 mg | ORAL_TABLET | Freq: Every day | ORAL | Status: DC
  Administered 2021-03-16 – 2021-03-17 (×2): 50 mg via ORAL
  Filled 2021-03-16 (×2): qty 1

## 2021-03-16 MED ORDER — FOOD THICKENER (SIMPLYTHICK): 1.0000 | ORAL | Status: DC | PRN

## 2021-03-16 NOTE — Progress Notes (Signed)
03/15/21 2000  SLP Visit Information  SLP Received On 03/15/21  Subjective  Subjective pt initially awake in bed, wife at bedside  Patient/Family Stated Goal non  General Information  Date of Onset 03/15/21  HPI Pt is an 80 yo male adm to [facility] from memory care after fall.  He has h/o Asthma, CAD, prostate cancer, GERD, HTN, TIA status post mitral valve repair, Stable cardiomegaly and elevation of the right hemidiaphragm.  Pt has not been adequately alert for po and congestion at larynx has increased since his admit.  He is under palliative care at his ALF.  Family reports pt with good intake PTA.  Type of Study Bedside Swallow Evaluation  Previous Swallow Assessment none  Diet Prior to this Study NPO  Temperature Spikes Noted No  Respiratory Status Room air  History of Recent Intubation No  Behavior/Cognition Lethargic/Drowsy;Distractible;Alert;Confused;Agitated;Doesn't follow directions (initially awake, then closes his eyes without participation, ? behavioral due to dementia vs mentation)  Oral Cavity Assessment Other (comment) (pt did not open mouth to allow SLP to view)  Oral Care Completed by SLP No  Oral Cavity - Dentition Other (Comment) (wife states pt has teeth)  Self-Feeding Abilities Total assist  Patient Positioning Upright in bed;Other (comment)  Baseline Vocal Quality Normal  Volitional Cough Cognitively unable to elicit  Volitional Swallow Unable to elicit  Pain Assessment  Breathing 0  Negative Vocalization 0  Facial Expression 0  Body Language 0  Consolability 0  PAINAD Score 0  Oral Assessment (Complete on admission/transfer/every shift)  Does patient have any of the following "high(er) risk" factors? Nutritional status - fluids only or NPO for >24 hours  Does patient have any of the following "at risk" factors? Nutritional status - inadequate  Patient is HIGH RISK: Non-ventilated Order set for Adult Oral Care Protocol initiated - "High Risk Patients -  Non-Ventilated" option selected  (see row information)  Oral Motor/Sensory Function  Overall Oral Motor/Sensory Function Other (comment) (able to seal lips on spoon, no focal CN deficits)  Thin Liquid  Thin Liquid Impaired  Presentation Spoon;Straw  Oral Phase Impairments Poor awareness of bolus  Oral Phase Functional Implications Oral holding;Prolonged oral transit  Pharyngeal  Phase Impairments Cough - Delayed  Nectar Thick Liquid  Nectar Thick Liquid Impaired  Presentation Spoon  Oral Phase Impairments Poor awareness of bolus  Oral phase functional implications Oral holding;Prolonged oral transit  Pharyngeal Phase Impairments Cough - Delayed  Honey Thick Liquid  Honey Thick Liquid NT  Puree  Puree Impaired  Presentation Spoon  Oral Phase Impairments Impaired mastication  Oral Phase Functional Implications Oral holding;Prolonged oral transit  Pharyngeal Phase Impairments Suspected delayed Swallow  Solid  Solid NT  SLP - End of Session  Patient left in bed;with call bell/phone within reach;with family/visitor present  Nurse Communication Aspiration precautions reviewed;Diet recommendation;Swallow strategies reviewed  SLP Assessment  Clinical Impression Statement (ACUTE ONLY) Pt awake in bed initially with wife present.  Pt appears easily agitated with being slid up in bed and with cranial nerve evaluation attempts - waving his hand at SLP and shaking his head.  He did not open his mouth or follow directions for oral motor exam even with encouragement from his wife.       Pt's voice was clear, however pt appears with congested breathing within a few minutes of HOB elevation - concerning for secretion retention.  During most of the session, pt had his eyes closed and barely opened his mouth *if at all*  to accept small amounts of intake *nectar thick juice, water, applesauce from his wife.      Significantly delayed swallow noted inconsistently - with pt often not eliciting swallow  despite verbal/visual cues from his wife/SLP and even dry spoon pressure to tongue to trigger motor system.  Question pharyngeal sensory deficits given dementia and/or secretion retention. Congested cough x2 observed with po attempts concerning for airway infiltration of small amount of po provided.       At this time, pt's mentation, decreased participation, congested cough without clearance (sounding as if at larynx/pharynx) and clinical indications of dysphagia, preclude pt from consuming po due to significant aspiration risk.       Recommend pt continue npo x tsps of thin water when pt fully alert and accepting ONLY.  Needed medications with puree.  Pt will not participate in MBS and thus clinically evaluations/diagnostic treatment indicated.       Wife denies pt coughing with po prior to admit, thus hopefully with improved medical condition and mentation, his dysphagia will improve.     Advised pt's wife and his daughter *who arrived later in the session* to sequale of dysphagia with dementia and need to modify based on pt's condition/status.  Goal of SLP is for aspiration/dysphagia mitigation - not definitive aspiration prevention.        Will follow up next date *mid morning* for observation of pt consumption *out of pt's view - as SLPs presence appears to agitate pt and decrease his participation. All parties agreeable to plan.  SLP Visit Diagnosis Dysphagia, oral phase (R13.11);Dysphagia, unspecified (R13.10)  Impact on safety and function Severe aspiration risk;Risk for inadequate nutrition/hydration  Other Related Risk Factors Cognitive impairment;Deconditioning;Decreased respiratory status  Swallow Evaluation Recommendations  SLP Diet Recommendations NPO;NPO except meds  Liquid Administration via  (tsps water)  Medication Administration Crushed with puree  Supervision Full supervision/cueing for compensatory strategies  Treatment Plan  Oral Care Recommendations Oral care BID  Treatment  Recommendations Therapy as outlined in treatment plan below  Follow Up Recommendations Skilled nursing-short term rehab (<3 hours/day)  Assistance recommended at discharge Frequent or constant Supervision/Assistance  Functional Status Assessment Patient has had a recent decline in their functional status and demonstrates the ability to make significant improvements in function in a reasonable and predictable amount of time.  Speech Therapy Frequency (ACUTE ONLY) min 1 x/week  Treatment Duration 1 week  Interventions Aspiration precaution training;Compensatory techniques;Patient/family education;Trials of upgraded texture/liquids;Other (Comment)  Prognosis  Prognosis for Safe Diet Advancement Fair  Barriers to Reach Goals Cognitive deficits;Behavior;Motivation  Individuals Consulted  Consulted and Agree with Results and Recommendations Family member/caregiver  Family Member Consulted wife, daughter  SLP Time Calculation  SLP Start Time (ACUTE ONLY) 1825  SLP Stop Time (ACUTE ONLY) 1916  SLP Time Calculation (min) (ACUTE ONLY) 51 min  SLP Evaluations  $ SLP Speech Visit 1 Visit  SLP Evaluations  $BSS Swallow 1 Procedure  $Swallowing Treatment 1 Procedure  Kathleen Lime, MS [facility] SLP Acute Rehab Services Office [phone] Pager [phone]

## 2021-03-16 NOTE — Progress Notes (Signed)
Speech Language Pathology Treatment: Dysphagia  Patient Details Name: [name] MRN: [id number] DOB: 1941/01/03 Today's Date: 03/16/2021 Time: 1520-1550 SLP Time Calculation (min) (ACUTE ONLY): 30 min  Assessment / Plan / Recommendation Clinical Impression  Today pt seen for skilled SLP to help in determining readiness for po intake and review mitigation strategies.  Pt alert with wife at bedside.  Voice was clear without congestion heard initially however as po continued,pt with increased upper airway congestion and cough.  After intake of nectar thick water x3 boluses, Iccee x approximately 7 small boluses, graham cracker bolus - which he expectorated without mastication, pt coughed and expectorated thin secretions - white.   Recommend consideration for comfort feeding including puree - nectar and tsps thin as pt tolerates, desires.  Pt accepts only very small boluses and only intermittently swallows.  He did close his eyes half way through session with decreased participation - Advised pt's wife, [name], to stop giving pt intake when congestion increases, he is lethargic or indicates he does not desire po intake. Any diet at this time for "[name]" will not eliminate aspiration nor will po diet allow adequate nutrition based on clinical evaluation.  All education completed for aspiration/dysphagia mitigation - wife agrees. No SLP follow up.  Thanks.      HPI HPI: Pt is an 80 yo male adm to [facility] from memory care after fall.  He has h/o Asthma, CAD, prostate cancer, GERD, HTN, TIA status post mitral valve repair, Stable cardiomegaly and elevation of the right hemidiaphragm.  Pt has not been adequately alert for po and congestion at larynx has increased since his admit.  He is under palliative care at his ALF.  Family reports pt with good intake PTA.      SLP Plan  All goals met      Recommendations for follow up therapy are one component of a multi-disciplinary discharge planning process, led  by the attending physician.  Recommendations may be updated based on patient status, additional functional criteria and insurance authorization.    Recommendations  Diet recommendations: Dysphagia 1 (puree);Thin liquid;Nectar-thick liquid Liquids provided via: Teaspoon Medication Administration: Crushed with puree Supervision: Staff to assist with self feeding Compensations: Slow rate;Small sips/bites;Lingual sweep for clearance of pocketing Postural Changes and/or Swallow Maneuvers: Seated upright 90 degrees;Upright 30-60 min after meal                Oral Care Recommendations: Oral care BID Follow Up Recommendations: No SLP follow up Assistance recommended at discharge: Frequent or constant Supervision/Assistance SLP Visit Diagnosis: Dysphagia, oral phase (R13.11);Dysphagia, unspecified (R13.10) Plan: All goals met       GO                [name] Ann.slp   03/16/2021, 4:08 PM

## 2021-03-16 NOTE — Evaluation (Signed)
Occupational Therapy Evaluation Patient Details Name: [name] MRN: [id number] DOB: July 05, 1940 Today's Date: 03/16/2021   History of Present Illness Patiet is an 80 y.o. male presented from [place] memory unit for increased agitation, confusion, fever, and fall at facility. Pt admitted for sepsis with encephalopathy 2/2 rhinovirus. PMH significant for Asthma, CAD, prostate cancer, GERD, HTN, osteoporosis, TIA status post mitral valve repair.   Clinical Impression   Patient evaluated by Occupational Therapy with no further acute OT needs identified. Pt is at his baseline with ADLs as pt is universally assisted at his memory care unit her pt's daughter. Pt's spouse and daughter agree that OT intervention is not appropriate for the pt at this time and support OT sign off. All education has been completed and the patient has no further questions.  See below for any follow-up Occupational Therapy or equipment needs. OT is signing off. Thank you for this referral.      Recommendations for follow up therapy are one component of a multi-disciplinary discharge planning process, led by the attending physician.  Recommendations may be updated based on patient status, additional functional criteria and insurance authorization.   Follow Up Recommendations  No OT follow up    Assistance Recommended at Discharge Frequent or constant Supervision/Assistance  Functional Status Assessment  Patient has had a recent decline in their functional status and/or demonstrates limited ability to make significant improvements in function in a reasonable and predictable amount of time  Equipment Recommendations       Recommendations for Other Services       Precautions / Restrictions Precautions Precautions: Fall Precaution Comments: dementia, advanced at baseline Required Braces or Orthoses: Other Brace Other Brace: Hand/mitten restraints. Restrictions Weight Bearing Restrictions: No       Mobility Bed Mobility Overal bed mobility: Needs Assistance Bed Mobility: Supine to Sit     Supine to sit: Mod assist Sit to supine: Min assist   General bed mobility comments: Still BLEs that required assist to EOB with pt cooperating. Pt then able to turn his upper body and scoot to EOB.    Transfers                   General transfer comment: Pt refused to stand despite encouragement from therapist and family, increased time.      Balance Overall balance assessment: Needs assistance Sitting-balance support: Feet unsupported;Single extremity supported Sitting balance-Leahy Scale: Good         Standing balance comment: Pt would not stand.                           ADL either performed or assessed with clinical judgement   ADL Overall ADL's : At baseline                                       General ADL Comments: Per daugher, pt is baseline heavilty assisted with all ADLs with the exception of self-feeding finger foods with supervision.     Vision   Vision Assessment?: No apparent visual deficits Additional Comments: Pt sat EOB and appears to enjoy looking outside.     Perception     Praxis      Pertinent Vitals/Pain Pain Assessment: PAINAD Breathing: normal Negative Vocalization: none Facial Expression: sad, frightened, frown Body Language: relaxed Consolability: no need to console PAINAD Score: 1 Pain  Intervention(s): Limited activity within patient's tolerance;Monitored during session;Repositioned     Hand Dominance     Extremity/Trunk Assessment Upper Extremity Assessment Upper Extremity Assessment: Difficult to assess due to impaired cognition (Pt does not seem to have any upper body deficits based on general observation. Pt too confused for testing)   Lower Extremity Assessment Lower Extremity Assessment: Generalized weakness;Difficult to assess due to impaired cognition   Cervical / Trunk  Assessment Cervical / Trunk Assessment: Normal   Communication Communication Communication: Expressive difficulties;Prefers language other than English   Cognition Arousal/Alertness: Awake/alert Behavior During Therapy: Restless;Flat affect;Impulsive Overall Cognitive Status: History of cognitive impairments - at baseline Area of Impairment: Following commands;Safety/judgement;Awareness;Attention                   Current Attention Level: Focused   Following Commands: Follows one step commands inconsistently Safety/Judgement: Decreased awareness of deficits;Decreased awareness of safety Awareness: Intellectual         General Comments       Exercises     Shoulder Instructions      Home Living Family/patient expects to be discharged to:: Skilled nursing facility                                 Additional Comments: Return to memory care unit for 24/7 assist and follow up PT for mobility impairments.      Prior Functioning/Environment Prior Level of Function : Needs assist  Cognitive Assist : ADLs (cognitive);Mobility (cognitive) Mobility (Cognitive): Step by step cues ADLs (Cognitive): Step by step cues     ADLs (physical): Feeding;Bathing;Grooming;Dressing;Toileting;IADLs   ADLs Comments: pt needs assist from personal aid with ALL ADLs at baseline with exception with pt feeding self finger foods with close supervision. Per daughter, pt with "advanced dementia and needs help with all his self care."        OT Problem List: Decreased cognition      OT Treatment/Interventions:      OT Goals(Current goals can be found in the care plan section) Acute Rehab OT Goals Patient Stated Goal: Pt's spouse and daughter agreed for OT to sign off  OT Frequency:     Barriers to D/C:            Co-evaluation              AM-PAC OT "6 Clicks" Daily Activity     Outcome Measure Help from another person eating meals?:  (Pt has been refusing per  spouse. SLP on case) Help from another person taking care of personal grooming?: Total Help from another person toileting, which includes using toliet, bedpan, or urinal?: Total Help from another person bathing (including washing, rinsing, drying)?: Total Help from another person to put on and taking off regular upper body clothing?: A Lot Help from another person to put on and taking off regular lower body clothing?: Total 6 Click Score: 6   End of Session Equipment Utilized During Treatment:  (HAnd mittends re-donned at end of session) Nurse Communication: Other (comment) (RN assisted OT with pulling pt up in bed. RN notified that OT to sign off.\)  Activity Tolerance: Patient tolerated treatment well Patient left: in bed;with bed alarm set;with nursing/sitter in room;with family/visitor present  OT Visit Diagnosis: Other symptoms and signs involving cognitive function;Cognitive communication deficit (R41.841);Unsteadiness on feet (R26.81)                Time: 5397-6734 OT  Time Calculation (min): 24 min Charges:  OT General Charges $OT Visit: 1 Visit OT Evaluation $OT Eval Low Complexity: [place], [place] Office: [phone] 03/16/2021  Julien Girt 03/16/2021, 1:17 PM

## 2021-03-16 NOTE — Progress Notes (Signed)
Manufacturing engineer [facility])  Hospital Liaison: RN note      Received a request to discuss hospice in facility versus hospice at [facility]. Spoke with wife, [name] to answer questions and provided education. Family plans to meet and discuss with Dr. Posey Pronto again later today before making a choice.    Hospital liaison will follow up with [name] after the family meeting.  Please do not hesitate to call with questions.   Thank you,   Farrel Gordon, RN, [place] Hospital Liaison   [phone]

## 2021-03-17 DIAGNOSIS — Z7189 Other specified counseling: Secondary | ICD-10-CM

## 2021-03-17 DIAGNOSIS — Z515 Encounter for palliative care: Secondary | ICD-10-CM

## 2021-03-17 DIAGNOSIS — G253 Myoclonus: Secondary | ICD-10-CM | POA: Diagnosis present

## 2021-03-17 LAB — ECHOCARDIOGRAM COMPLETE
AR max vel: 0.51 cm2
AV Area VTI: 0.49 cm2
AV Area mean vel: 0.45 cm2
AV Mean grad: 46 mmHg
AV Peak grad: 74.3 mmHg
Ao pk vel: 4.31 m/s
Area-P 1/2: 3.21 cm2
Height: 68 in
MV VTI: 0.88 cm2
S' Lateral: 3 cm
Weight: 2726.65 oz

## 2021-03-17 MED ORDER — GUAIFENESIN-DM 100-10 MG/5ML PO SYRP
5.0000 mL | ORAL_SOLUTION | ORAL | Status: DC | PRN
  Administered 2021-03-17 – 2021-03-19 (×3): 5 mL via ORAL
  Filled 2021-03-17 (×2): qty 10

## 2021-03-17 MED ORDER — LORAZEPAM 2 MG/ML PO CONC: 1.0000 mg | ORAL | Status: DC | PRN

## 2021-03-17 MED ORDER — DEXTROMETHORPHAN POLISTIREX ER 30 MG/5ML PO SUER: 15.0000 mg | Freq: Two times a day (BID) | ORAL | Status: DC | PRN

## 2021-03-17 MED ORDER — ONDANSETRON 4 MG PO TBDP: 4.0000 mg | ORAL_TABLET | Freq: Four times a day (QID) | ORAL | Status: DC | PRN

## 2021-03-17 MED ORDER — BISACODYL 10 MG RE SUPP: 10.0000 mg | Freq: Every day | RECTAL | Status: DC | PRN

## 2021-03-17 MED ORDER — LEVALBUTEROL HCL 0.63 MG/3ML IN NEBU
0.6300 mg | INHALATION_SOLUTION | RESPIRATORY_TRACT | Status: DC | PRN
  Administered 2021-03-19 – 2021-03-20 (×2): 0.63 mg via RESPIRATORY_TRACT
  Filled 2021-03-17 (×4): qty 3

## 2021-03-17 MED ORDER — GLYCOPYRROLATE 0.2 MG/ML IJ SOLN: 0.2000 mg | INTRAMUSCULAR | Status: DC | PRN

## 2021-03-17 MED ORDER — LORAZEPAM 2 MG/ML IJ SOLN
1.0000 mg | INTRAMUSCULAR | Status: DC | PRN
  Administered 2021-03-18: 01:00:00 1 mg via INTRAVENOUS
  Filled 2021-03-17 (×2): qty 1

## 2021-03-17 MED ORDER — LOSARTAN POTASSIUM 50 MG PO TABS
50.0000 mg | ORAL_TABLET | Freq: Every day | ORAL | Status: DC
  Administered 2021-03-18 – 2021-03-22 (×5): 50 mg via ORAL
  Filled 2021-03-17 (×5): qty 1

## 2021-03-17 MED ORDER — GLYCOPYRROLATE 1 MG PO TABS: 1.0000 mg | ORAL_TABLET | ORAL | Status: DC | PRN

## 2021-03-17 MED ORDER — LORAZEPAM 1 MG PO TABS
1.0000 mg | ORAL_TABLET | ORAL | Status: DC | PRN
  Administered 2021-03-18 – 2021-03-20 (×3): 1 mg via ORAL
  Filled 2021-03-17 (×3): qty 1

## 2021-03-17 MED ORDER — DOXYCYCLINE HYCLATE 100 MG PO TABS
100.0000 mg | ORAL_TABLET | Freq: Two times a day (BID) | ORAL | Status: AC
  Administered 2021-03-17: 100 mg via ORAL
  Filled 2021-03-17: qty 1

## 2021-03-17 MED ORDER — ACETAMINOPHEN 650 MG RE SUPP: 650.0000 mg | Freq: Four times a day (QID) | RECTAL | Status: DC | PRN

## 2021-03-17 MED ORDER — HALOPERIDOL 2 MG PO TABS: 2.0000 mg | ORAL_TABLET | ORAL | Status: DC | PRN

## 2021-03-17 MED ORDER — DIPHENHYDRAMINE HCL 50 MG/ML IJ SOLN: 12.5000 mg | INTRAMUSCULAR | Status: DC | PRN

## 2021-03-17 MED ORDER — LORAZEPAM 2 MG/ML IJ SOLN
1.0000 mg | INTRAMUSCULAR | Status: DC | PRN
  Administered 2021-03-17: 1 mg via INTRAVENOUS
  Filled 2021-03-17: qty 1

## 2021-03-17 MED ORDER — MORPHINE SULFATE (CONCENTRATE) 10 MG/0.5ML PO SOLN: 5.0000 mg | ORAL | Status: DC | PRN

## 2021-03-17 MED ORDER — HALOPERIDOL LACTATE 5 MG/ML IJ SOLN: 2.0000 mg | INTRAMUSCULAR | Status: DC | PRN

## 2021-03-17 MED ORDER — ONDANSETRON HCL 4 MG/2ML IJ SOLN: 4.0000 mg | Freq: Four times a day (QID) | INTRAMUSCULAR | Status: DC | PRN

## 2021-03-17 MED ORDER — HALOPERIDOL LACTATE 2 MG/ML PO CONC: 2.0000 mg | ORAL | Status: DC | PRN

## 2021-03-17 MED ORDER — BENZONATATE 100 MG PO CAPS
100.0000 mg | ORAL_CAPSULE | Freq: Three times a day (TID) | ORAL | Status: DC
  Administered 2021-03-17: 100 mg via ORAL
  Filled 2021-03-17 (×3): qty 1

## 2021-03-17 MED ORDER — ESCITALOPRAM OXALATE 20 MG PO TABS
20.0000 mg | ORAL_TABLET | Freq: Every day | ORAL | Status: DC
  Administered 2021-03-17 – 2021-03-22 (×6): 20 mg via ORAL
  Filled 2021-03-17 (×6): qty 1

## 2021-03-17 MED ORDER — MORPHINE SULFATE (PF) 2 MG/ML IV SOLN: 2.0000 mg | INTRAVENOUS | Status: DC | PRN

## 2021-03-17 MED ORDER — ACETAMINOPHEN 325 MG PO TABS: 650.0000 mg | ORAL_TABLET | Freq: Four times a day (QID) | ORAL | Status: DC | PRN

## 2021-03-17 NOTE — Assessment & Plan Note (Addendum)
Blood pressure stable.  Continue losartan.

## 2021-03-17 NOTE — Assessment & Plan Note (Addendum)
-   Hold statin 

## 2021-03-17 NOTE — Assessment & Plan Note (Addendum)
Presents with fall.  Found to have worsening confusion.  Also has fever tachycardia. Met SIRS criteria on admission.  With worsening mentation organ damage. Fever improving with scheduled Toradol. Remains tachypneic.  Upper airway wheezing.  With poor secretion clearance. Chest x-ray negative.  CT abdomen negative for any acute abnormality. Blood cultures so far negative. Rhinovirus test is positive.  Influenza and COVID-negative.

## 2021-03-17 NOTE — Assessment & Plan Note (Signed)
Replaced. °

## 2021-03-17 NOTE — Assessment & Plan Note (Addendum)
Multiple discussion with the family.  Palliative care consulted. Goal is to focus on comfort.  Transition to comfort care for now with transition to [name] with [facility] hospice.  Monitor for patient progression at [name] once back in familiar environment.

## 2021-03-17 NOTE — Assessment & Plan Note (Addendum)
Treated with IV vancomycin and Fortaz.  Transition to doxycycline.  Completed 5-day treatment therapy. So far cultures are negative.  Cellulitis has resolved.

## 2021-03-17 NOTE — Assessment & Plan Note (Addendum)
Continue with respiratory distress.  Continue with nebulizer therapy with Xopenex due to poor compliance with inhaler therapy.

## 2021-03-17 NOTE — Assessment & Plan Note (Addendum)
Possibly delirium secondary to sepsis. Currently mentation improving but still not back to baseline. At baseline the patient is able to ambulate, feed himself.  Concern for serotonin syndrome therefore Lexapro was on hold, ruled out, thus medication resumed on 12/3. Continue Ativan as needed. Metabolic work-up including TSH, ammonia level normal.  VBG did not show any hypercarbia.  LFTs normal.  EKG unremarkable.  CT head negative for any acute intracranial abnormality.  Blood culture and urine culture also negative.

## 2021-03-17 NOTE — Progress Notes (Addendum)
[facility] Progress Note  Patient: [name]    WGY:[id number]  DOA: 03/13/2021    Date of Service: the patient was seen and examined on 03/17/2021  Brief hospital course: 11/29, admitted with worsening mentation, fever and sepsis. 11/30 RVP positive for rhinovirus. 12/1 mentation improving, not able to follow commands.  Speech evaluation still pending. 12/3, mentation waxing and waning.  Palliative care consulted.  Transition to complete comfort.  Assessment and Plan: * Severe sepsis ([place]) due to rhinovirus infection Presents with fall.  Found to have worsening confusion.  Also has fever tachycardia. Met SIRS criteria on admission.  With worsening mentation organ damage. Fever improving with scheduled Toradol. Remains tachypneic.  Upper airway wheezing.  With poor secretion clearance. Chest x-ray negative.  CT abdomen negative for any acute abnormality. Blood cultures so far negative. Rhinovirus test is positive.  Influenza and COVID-negative.  Acute metabolic encephalopathy Secondary to sepsis. Currently mentation still not back to baseline. At baseline the patient is able to ambulate, feed himself.  Intermittently also communicates in his home language. Concern for serotonin syndrome therefore Lexapro was on hold. Resuming it Continue Ativan as needed. Metabolic work-up including TSH, ammonia level normal.  VBG did not show any hypercarbia.  LFTs normal.  EKG unremarkable.  CT head negative for any acute intracranial abnormality.  Blood culture and urine culture also negative.  Cellulitis of right lower leg Treated with IV vancomycin and Fortaz.  So far cultures are negative. Will complete 5-day treatment course with doxycycline, last day 12/3.  Moderate dementia with behavioral disturbance ([place]) Along with delirium. On Exelon patch will discontinue due to ongoing agitation. Speech therapy evaluated the patient.  Patient unable to pass a swallowing  evaluation. Diet advanced to D1 diet.  Patient was able to tolerate it well with intermittent coughing.  Currently on comfort care due to ongoing agitation and encephalopathy. Palliative care consulted.  Asthma, moderate persistent, poorly-controlled Continue with respiratory distress.  Continue with nebulizer therapy with Xopenex.  Goals of care, counseling/discussion Multiple discussion with the family on 12/3.  Palliative care consulted. Goal is to focus on comfort.  Transition to comfort care for now. Awaiting final plan with palliative care.  Hip pain Patient reports any pain while moving his right leg. X-ray had some concerns regarding hip fracture which was ruled out on CT scan.  Discussed with radiology personally.  HLD (hyperlipidemia) Hold statin  HTN (hypertension) Blood pressure stable.  Holding home regimen.  Hypomagnesemia Replaced  Constipation Currently resolved  CAD (coronary artery disease) No evidence of acute ischemia.  Monitor.  Hypophosphatemia Replaced   Myoclonic jerking Associated with confusion. EEG negative for any seizure or epileptogenic foci. Intermittent in nature likely associated with encephalopathy. There was some initial concern for serotonin syndrome although appears that the patient is suffering from rhinovirus infection which might be responsible for delirium. Currently comfort care.  Continue Ativan as needed.    Body mass index is 25.91 kg/m.  Nutrition Problem: Inadequate oral intake Etiology: decreased appetite, inability to eat     Subjective: Continues to have agitation in the morning.  Severe respiratory distress.  No nausea no vomiting.  No other acute events overnight.  Had a bowel movement.  Later in the afternoon more lucid.  Objective: Blood pressure elevated.  Exam: General: Appear in mild distress, no Rash; Oral Mucosa Clear, moist. no Abnormal Neck Mass Or lumps, Conjunctiva normal  Cardiovascular: S1 and S2  Present, no Murmur, Respiratory: increased respiratory effort, Bilateral Air  entry present and faint crackles, upper airway wheezes Abdomen: Bowel Sound present, Soft and no tenderness Extremities: trace Pedal edema Neurology: alert and not oriented to time, place, and person affect agitated. no new focal deficit Gait not checked due to patient safety concerns    Data Reviewed: There are no new results to review at this time.  Disposition:  Status is: Inpatient  Remains inpatient appropriate because: Unsafe discharge.  Transition to complete comfort.  Severe symptom burden.   Family Communication: All family at bedside.  Multiple conversation with the family today.  DVT Prophylaxis: SCDs Start: 03/14/21 0134   Time spent: Time in 11 AM, time out 12 PM Time in 3:30 PM time out 4 PM  Author: Berle Mull  03/17/2021 5:01 PM  To reach On-call, see care teams to locate the attending and reach out via [email or web address]. Between 7PM-7AM, please contact night-coverage If you still have difficulty reaching the attending provider, please page the [facility] (Director on Call) for [facility] on amion for assistance.

## 2021-03-17 NOTE — Assessment & Plan Note (Signed)
No evidence of acute ischemia.  Monitor.

## 2021-03-17 NOTE — Assessment & Plan Note (Addendum)
On admission patient reports any pain while moving his right leg. X-ray had some concerns regarding hip fracture which was ruled out on CT scan.  Discussed with radiology personally.

## 2021-03-17 NOTE — Assessment & Plan Note (Signed)
Currently resolved. °

## 2021-03-17 NOTE — Assessment & Plan Note (Addendum)
Along with delirium. On Exelon patch, continue per family request. If he required medication for agitation Zyprexa would be a better choice.

## 2021-03-17 NOTE — Progress Notes (Signed)
[facility] Progress Note  Patient: [name]    OIZ:[id number]  DOA: 03/13/2021    Date of Service: the patient was seen and examined on 03/16/2021  Brief hospital course: 11/29, admitted with worsening mentation, fever and sepsis. 11/30 RVP positive for rhinovirus. 12/1 mentation improving, not able to follow commands.  Speech evaluation still pending. 12/1 diet advanced to d1 nectar thick, mentation still no better in morning, but later in the day improved.    Assessment and Plan: * Severe sepsis ([place]) due to rhinovirus infection Presents with fall.  Found to have worsening confusion.  Also has fever tachycardia. Met SIRS criteria on admission.  With worsening mentation organ damage. Fever improving with scheduled Toradol. Tachypneic as well. Chest x-ray negative.  CT abdomen negative for any acute abnormality. Blood cultures so far negative. Rhinovirus test is positive.  Influenza and COVID-negative. Continue close monitoring.   Acute metabolic encephalopathy Secondary to sepsis. Currently mentation still not back to baseline.  At baseline the patient is able to ambulate, feed himself.  Intermittently also communicates in his home language. Concern for serotonin syndrome therefore avoiding all serotonergic medications.Holding Lexapro. Continue Ativan as needed.   Cellulitis of right lower leg Treated with IV vancomycin and Fortaz.  So far cultures are negative.  Resolution of doxycycline for a few more days.   Moderate dementia with behavioral disturbance ([facility])  dysphagia  Along with delirium. Currently monitoring.  On Exelon patch continue. Discussed with speech therapy, with regards to diet. pt's primary issue is mentation, he has good cough and strength is adequate. Advanced to D1 nectar thick diet,. Saw the pt in evening and appears that the pt is tolerating it well.    Asthma, moderate persistent, poorly-controlled Mild respiratory stress.  Continue with  nebulizer therapy with Xopenex.   Goals of care, counseling/discussion Discussed with patient's wife at bedside.  Patient's condition is critical although appears to be stable and going in the right direction. At this juncture should the patient's condition worsens further, family would like to transition to comfort and hospice.  If the patient improves, continue current care plan of treat what is treatable. Regardless of the outcome of the hospital stay, recommended family to consider hospice on discharge.    Hip pain Patient reports any pain while moving his right leg. X-ray had some concerns regarding hip fracture which was ruled out on CT scan.  Discussed with radiology personally.   HLD (hyperlipidemia) Continue statin once mentation improves.   HTN (hypertension) Blood pressure stable.  Holding home regimen.   Hypomagnesemia Replaced   Constipation Currently resolved with a bowel movement.  Monitor.   CAD (coronary artery disease) No evidence of acute ischemia.  Monitor.   Hypophosphatemia Replaced    Subjective: more alert in the afternoon. Tolerating diet.   Objective: Tachypneic and blood pressure high  Exam: General: Appear in mild distress, no Rash; Oral Mucosa Clear, moist. no Abnormal Neck Mass Or lumps, Conjunctiva normal  Cardiovascular: S1 and S2 Present, no Murmur, Respiratory: increased respiratory effort, Bilateral Air entry present and bilateral  Crackles, no wheezes Abdomen: Bowel Sound present, Soft and no tenderness Extremities: trace Pedal edema Neurology: alert and not oriented to time, place, and person affect appropriate. no new focal deficit Gait not checked due to patient safety concerns   Data Reviewed: My review of labs, imaging, notes and other tests shows no new significant findings.   Disposition:  Status is: Inpatient  Remains inpatient appropriate because: Ongoing mentation.  Poor p.o. intake.  Family Communication: Family at  bedside.  Discussed with daughter as well.  DVT Prophylaxis: SCDs Start: 03/14/21 0134   Time spent: 35 minutes.   Author: Berle Mull  03/16/2021 7:37 AM  To reach On-call, see care teams to locate the attending and reach out via [email or web address]. Between 7PM-7AM, please contact night-coverage If you still have difficulty reaching the attending provider, please page the [facility] (Director on Call) for [facility] on amion for assistance.

## 2021-03-17 NOTE — Progress Notes (Addendum)
Palliative Care Consult Note  Reason for consult: Goals of care  Palliative care consult received.  Chart reviewed including personal review of pertinent labs and imaging.  I discussed [name]'s case with Dr. Posey Pronto, his bedside RN, and SLP.  Briefly, Mr. Brindisi is an 80 year old male with past medical history of asthma, CAD, prostate cancer, GERD, hypertension, TIA, and memory impairment who is a long-term resident at [name].  He is currently admitted with sepsis related to rhinovirus infection, cellulitis to lower extremity, and encephalopathy.  His intake has remained poor and his mental status waxes and wanes.  Previous conversation had been for transition to comfort care.  Family has been discussing further and requesting conversation regarding his options and palliative requested to see today.   I met today with [name] and his family.  This included his wife, 2 daughters, and son in law.    I introduced palliative care as specialized medical care for people living with serious illness. It focuses on providing relief from the symptoms and stress of a serious illness. The goal is to improve quality of life for both the patient and the family.  Family is familiar with palliative care services as he is followed by [facility] palliative care as an outpatient.  We discussed clinical course as well as wishes moving forward in regard to advanced directives.  We discussed his previous level of function and the fact that he has had a quick decline but is very impaired with his ability to swallow and his desire for intake at this time.  We had a long discussion regarding the changes they have seen in his nutrition, cognition, and functional status both over the past several years but more acutely with his hospitalization.  Concepts specific to care plan and rehospitalization discussed.  We discussed difference between a aggressive medical intervention path and a palliative, comfort  focused care path.  Values and goals of care important to patient and family were attempted to be elicited.   Family reports that they had been in agreement with plan for comfort care this morning whenever he was acutely agitated.  This afternoon, however, he appears to be closer to his prior baseline before this admission and they are struggling with concern that he may position for comfort care "too soon."  Dr. Posey Pronto is addressed with family and they are working to restart some of his chronic medications.  We discussed that my major concern with his nutrition and hydration.  Discussed that even if he resumes his previous medications (which I am certainly not opposed to), if he is unable to maintain his nutrition, we are approaching end-of-life regardless of interventions.  Discussed that the other consideration if he continues to be unable to meet his basic nutrition and hydration fluid before artificial nutrition hydration via feeding tube.  Family clearly stated he would not want this and if it is my medical opinion that this would not provide additional quality time to his life.  Concept of Hospice and Palliative Care were discussed per family request and we specifically discussed residential versus hospice at long-term care.   Questions and concerns addressed.   PMT will continue to support holistically.   - DNR/DNI - Continue current care with primary focus on comfort, but we are also restarting some medications and planning to closely follow his status, particularly his nutrition and hydration, very closely over the next 24 hours.  If he is unable to maintain his nutrition and hydration, family may want  to work toward residential hospice for end-of-life care.  Otherwise, plan will likely be back to [facility] and I would certainly recommend hospice follow-up as well.  If he would transition back to [facility] and improve, he would still benefit from additional support of hospice and this  could always be revoked in the future if his nutritional status improves. - Plan for follow-up meeting tomorrow at 1330.  Start time: 1530 End time: 1650 Total time: 80 minutes  Greater than 50%  of this time was spent counseling and coordinating care related to the above assessment and plan.  Micheline Rough, MD [place] Team [phone]

## 2021-03-17 NOTE — Assessment & Plan Note (Addendum)
Associated with confusion. EEG negative for any seizure or epileptogenic foci. Intermittent in nature likely associated with encephalopathy. There was some initial concern for serotonin syndrome although appears that the patient is suffering from rhinovirus infection which might be responsible for delirium. Currently comfort care.  Continue Ativan as needed.

## 2021-03-17 NOTE — Progress Notes (Signed)
Palliative Care Brief Note  Discussed with Dr. Posey Pronto.  He has been discussing with family and goals are clear but will depend on his clinical course over the next day or two.  Will hold on palliative consult at this point.  Please call or reconsult if we can be of further assistance in the care of [name] moving forward.  Micheline Rough, MD [place] Team [phone]   No Charge Note

## 2021-03-18 DIAGNOSIS — E639 Nutritional deficiency, unspecified: Secondary | ICD-10-CM | POA: Diagnosis present

## 2021-03-18 LAB — CULTURE, BLOOD (ROUTINE X 2)
Culture: NO GROWTH
Culture: NO GROWTH
Special Requests: ADEQUATE
Special Requests: ADEQUATE

## 2021-03-18 MED ORDER — BENZONATATE 100 MG PO CAPS: 100.0000 mg | ORAL_CAPSULE | Freq: Three times a day (TID) | ORAL | Status: DC | PRN

## 2021-03-18 MED ORDER — ENSURE ENLIVE PO LIQD
237.0000 mL | Freq: Two times a day (BID) | ORAL | Status: DC
  Administered 2021-03-18 – 2021-03-22 (×6): 237 mL via ORAL

## 2021-03-18 NOTE — Progress Notes (Signed)
[facility] Progress Note  Patient: [name]    GTX:[id number]  DOA: 03/13/2021    Date of Service: the patient was seen and examined on 03/18/2021  Brief hospital course: 11/29, admitted with worsening mentation, fever and sepsis. 11/30 RVP positive for rhinovirus. 12/1 mentation improving, not able to follow commands.  Speech evaluation still pending. 12/3, mentation waxing and waning.  Palliative care consulted.  Transition to complete comfort. 12/4 family meeting with palliative care and myself. Plan discussed is to go back to [name] with hospice support.  Assessment and Plan: * Severe sepsis ([place]) due to rhinovirus infection Presents with fall.  Found to have worsening confusion.  Also has fever tachycardia. Met SIRS criteria on admission.  With worsening mentation organ damage. Fever improving with scheduled Toradol. Remains tachypneic.  Upper airway wheezing.  With poor secretion clearance. Chest x-ray negative.  CT abdomen negative for any acute abnormality. Blood cultures so far negative. Rhinovirus test is positive.  Influenza and COVID-negative.  Acute metabolic encephalopathy Possibly delirium secondary to sepsis. Currently mentation still not back to baseline. At baseline the patient is able to ambulate, feed himself.  Concern for serotonin syndrome therefore Lexapro was on hold, resumed on 12/3. Continue Ativan as needed. Metabolic work-up including TSH, ammonia level normal.  VBG did not show any hypercarbia.  LFTs normal.  EKG unremarkable.  CT head negative for any acute intracranial abnormality.  Blood culture and urine culture also negative.  Cellulitis of right lower leg Treated with IV vancomycin and Fortaz.  Transition to doxycycline.  Completed 5-day treatment therapy. So far cultures are negative.  Moderate dementia with behavioral disturbance ([place]) Along with delirium. On Exelon patch will discontinue due to ongoing  agitation. Progressive dementia delirium versus worsening of dementia. Palliative care consulted.  Asthma, chronic, unspecified asthma severity, uncomplicated Continue with respiratory distress.  Continue with nebulizer therapy with Xopenex.  Inadequate oral nutritional intake Patient with ongoing delirium.  Progressive dementia.  Not hungry but when offered food will take a few bites.  Remains at risk for not meeting nutritional to monitor. Not a good candidate for feeding tube placement secondary to his dementia which is likely the cause of his anorexia as well as dysphagia. If agitation remains an issue though, Zyprexa can be added which might help with agitation and improve appetite  Goals of care, counseling/discussion Multiple discussion with the family.  Palliative care consulted. Goal is to focus on comfort.  Transition to comfort care for now with transition to [name] with [facility] hospice.  Monitor for patient progression at [name] once back in familiar environment.  Hip pain Patient reports any pain while moving his right leg. X-ray had some concerns regarding hip fracture which was ruled out on CT scan.  Discussed with radiology personally.  HLD (hyperlipidemia) Hold statin  HTN (hypertension) Blood pressure stable.  Continue losartan.  Hypomagnesemia Replaced  Constipation Currently resolved  CAD (coronary artery disease) No evidence of acute ischemia.  Monitor.  Hypophosphatemia Replaced   Myoclonic jerking Associated with confusion. EEG negative for any seizure or epileptogenic foci. Intermittent in nature likely associated with encephalopathy. There was some initial concern for serotonin syndrome although appears that the patient is suffering from rhinovirus infection which might be responsible for delirium. Currently comfort care.  Continue Ativan as needed.   Body mass index is 26.85 kg/m.  Nutrition Problem: Inadequate oral  intake Etiology: decreased appetite, inability to eat     Subjective: Continues to have respiratory  distress with upper airway wheezing as well as cough.  No nausea no vomiting.  Oral intake still remains inadequate.  More interactive.  Objective: General: Appear in mild distress, no Rash; no Abnormal Neck Mass Or lumps, Conjunctiva normal  Cardiovascular: S1 and S2 Present, no Murmur Respiratory: increased respiratory effort, Bilateral Air entry present and no Crackles, bilateral upper airway expiratory wheezes Abdomen: Bowel Sound present, Soft and no tenderness Extremities: trace Pedal edema Neurology: alert and not oriented to time, place, and person affect appropriate. no new focal deficit Gait not checked due to patient safety concerns      Data Reviewed: There are no new results to review at this time.  Disposition:  Status is: Inpatient  Remains inpatient appropriate because: Ongoing discussion with regards to disposition.  Unsafe discharge right now.  Plan is to go back to ALF with hospice.  Family Communication: Family at bedside.  All questions answered satisfactorily.  DVT Prophylaxis: SCDs Start: 03/14/21 0134   Time spent: 35 minutes.   Author: Berle Mull  03/18/2021 3:15 PM  To reach On-call, see care teams to locate the attending and reach out via [email or web address]. Between 7PM-7AM, please contact night-coverage If you still have difficulty reaching the attending provider, please page the [facility] (Director on Call) for [facility] on amion for assistance.

## 2021-03-18 NOTE — Progress Notes (Signed)
Daily Progress Note   Patient Name: [name]       Date: 03/18/2021 DOB: February 07, 1941  Age: 80 y.o. MRN#: [id number] Attending Physician: Lavina Hamman, MD Primary Care Physician: Sueanne Margarita, DO Admit Date: 03/13/2021  Reason for Consultation/Follow-up: Establishing goals of care  Subjective: I met today with [name] family in conjunction with Dr. Posey Pronto.   [name] is still very compromised in regards to his nutrition and hydration with minimal intake, however, family reports that his mental status has been better today and he is awake and alert throughout our encounter (although he remains confused and has no insight into his situation).  Dr. Posey Pronto and I discussed options for his care moving forward and we reviewed their goal of supporting him as much as possible with the understanding he is likely going to continue to decline due to lack of nutrition and hydration.  We also discussed the multifactorial nature of delirium and that being in an unfamiliar environment can exacerbate this.  We discussed potential options of residential hospice and of him transitioning back to [name] with hospice following him.  We discussed how hospice support could be beneficial at [facility] and we also discussed that, if he continues to decline and does not take in enough nutrition and hydration to sustain himself, he could potentially be transition from [name] to residential hospice.  Length of Stay: 5  Current Medications: Scheduled Meds:  . chlorhexidine  15 mL Mouth Rinse BID  . escitalopram  20 mg Oral Daily  . feeding supplement  237 mL Oral BID BM  . ketorolac  15 mg Intravenous Q6H  . levalbuterol  0.63 mg Nebulization TID  . losartan  50 mg Oral Daily  . mouth  rinse  15 mL Mouth Rinse q12n4p    Continuous Infusions:   PRN Meds: acetaminophen **OR** acetaminophen, benzonatate, bisacodyl, diphenhydrAMINE, food thickener, glycopyrrolate **OR** glycopyrrolate **OR** glycopyrrolate, guaiFENesin-dextromethorphan, haloperidol **OR** haloperidol **OR** haloperidol lactate, levalbuterol, LORazepam **OR** LORazepam **OR** LORazepam, LORazepam, morphine injection, morphine CONCENTRATE **OR** morphine CONCENTRATE, naphazoline-glycerin, ondansetron **OR** ondansetron (ZOFRAN) IV  Physical Exam         General: Alert, awake, in no acute distress.  Confused. HEENT: No bruits, no goiter, no JVD Lungs: No increased work of breathing.  Good air movement. Abdomen:  nondistended Skin:  Warm and dry  Vital Signs: BP (!) 157/96 (BP Location: Left Arm)   Pulse 93   Temp 97.7 F (36.5 C) (Axillary)   Resp 20   Ht 5' 8"  (1.727 m)   Wt 80.1 kg   SpO2 97%   BMI 26.85 kg/m  SpO2: SpO2: 97 % O2 Device: O2 Device: Nasal Cannula O2 Flow Rate: O2 Flow Rate (L/min): 2 L/min  Intake/output summary: No intake or output data in the 24 hours ending 03/18/21 2141 LBM: Last BM Date: 03/18/21 Baseline Weight: Weight: 77 kg Most recent weight: Weight: 80.1 kg       Palliative Assessment/Data:      Patient Active Problem List   Diagnosis Date Noted  . Inadequate oral nutritional intake 03/18/2021  . Myoclonic jerking 03/17/2021  . Cellulitis of right lower leg 03/14/2021  . Constipation 03/14/2021  . Hypomagnesemia 03/14/2021  . Hypophosphatemia 03/14/2021  . Acute metabolic encephalopathy 81/18/8677  . Hip pain 03/14/2021  . Goals of care, counseling/discussion 03/14/2021  . Severe sepsis ([place]) due to rhinovirus infection 03/13/2021  . Discoloration of nail 06/21/2019  . Hammer toe, acquired 10/27/2018  . Syncope 03/28/2018  . Genetic testing 10/30/2017  . Family history of ovarian cancer   . History of prostate cancer   . History of melanoma   .  Moderate dementia with behavioral disturbance ([place]) 03/17/2017  . Hoarseness of voice 02/26/2017  . 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  . 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 Sleep behavior disorder, REM 06/11/2016  . TIA due to embolism ([place]) 06/11/2016  . History of heart artery stent 06/11/2016  . Subungual malignant melanoma ([place]) 06/19/2015  . Nonrheumatic aortic valve insufficiency 06/15/2015  . Malignant melanoma of finger ([place]) 06/02/2015  . Gastroesophageal reflux disease 01/10/2015  . Mild persistent asthma 01/10/2015  . HLD (hyperlipidemia) 01/10/2015  . ED (erectile dysfunction) 11/13/2014  . Lower urinary tract symptoms (LUTS) 11/13/2014  . Cancer of prostate with low recurrence risk (stage T1-2a and Gleason < 7 and PSA < 10) ([place]) 11/03/2014  . Elevated prostate specific antigen (PSA) 09/30/2014  . Asthma, moderate persistent, well-controlled 08/08/2014  . Dyspnea 01/27/2014  . Asthma, chronic, unspecified asthma severity, uncomplicated 37/36/6815  . Hypotension, unspecified 07/15/2013  . Hypotension 07/15/2013  . Dyspnea and respiratory abnormality 07/13/2013  . Dysphonia 05/08/2011  . S/P MVR (mitral valve repair) 01/14/2011  . HTN (hypertension)   . Osteoporosis   . CAD (coronary artery disease)   . Mitral valve prolapse with severe mitral regurgitation   . Diaphragm paralysis 02/09/2010  . Hypertension, essential 02/05/2010  . OSTEOPOROSIS 02/05/2010  . CARDIAC MURMUR, SYSTOLIC 94/70/7615  . COUGH 02/05/2010    Palliative Care Assessment & Plan   Patient Profile: [name] is an 80 year old male with past medical history of asthma, CAD, prostate cancer, GERD, hypertension, TIA, and memory impairment who is a long-term resident at [name].  He is currently admitted with sepsis related to rhinovirus infection, cellulitis to lower extremity, and encephalopathy.  His intake has remained poor and his mental status waxes and  wanes.  Recommendations/Plan: DNR/DNI Plan for transition back to [name] with hospice support.  Discussed with hospice liaison as well as TOC.  TOC will reach out to discuss further with [name] tomorrow.  Goals of Care and Additional Recommendations: Limitations on Scope of Treatment: Full Comfort Care  Code Status:    Code Status Orders  (From admission, onward)  Start     Ordered   03/17/21 1212  Do not attempt resuscitation (DNR)  Continuous       Question Answer Comment  In the event of cardiac or respiratory ARREST Do not call a "code blue"   In the event of cardiac or respiratory ARREST Do not perform Intubation, CPR, defibrillation or ACLS   In the event of cardiac or respiratory ARREST Use medication by any route, position, wound care, and other measures to relive pain and suffering. May use oxygen, suction and manual treatment of airway obstruction as needed for comfort.      03/17/21 1214           Code Status History     Date Active Date Inactive Code Status Order ID Comments User Context   03/14/2021 0135 03/17/2021 1214 DNR [id number]  Toy Baker, MD Inpatient   03/13/2021 2119 03/14/2021 0135 DNR [id number]  Toy Baker, MD ED   03/28/2018 1349 03/29/2018 1500 Full Code [id number]  Caren Griffins, MD ED   07/15/2013 1629 07/18/2013 1809 Full Code [id number]  [name], MD Inpatient      Advance Directive Documentation    Flowsheet Row Most Recent Value  Type of Advance Directive Out of facility DNR (pink MOST or yellow form)  Pre-existing out of facility DNR order (yellow form or pink MOST form) Yellow form placed in chart (order not valid for inpatient use)  "MOST" Form in Place? --       Prognosis:  < 6 months  Discharge Planning: Assisted living facility with hospice support  Care plan was discussed with family including patient's wife, 2 daughters, and son-in-law  Thank you for allowing the Palliative  Medicine Team to assist in the care of this patient.   Time In: 1330 Time Out: 1425 Total Time 55 Prolonged Time Billed No      Greater than 50%  of this time was spent counseling and coordinating care related to the above assessment and plan.  Micheline Rough, MD  Please contact Palliative Medicine Team phone at [phone] for questions and concerns.

## 2021-03-18 NOTE — Assessment & Plan Note (Addendum)
Patient with ongoing delirium.  Progressive dementia.  Not hungry but when offered food will take a few bites.  Remains at risk for not meeting nutritional demand. Not a good candidate for feeding tube placement secondary to his dementia which is likely the cause of his anorexia as well as dysphagia.  Family also not keen on feeding tube or prolonging his suffering.  If agitation remains an issue though, Zyprexa can be added which might help with agitation and improve appetite

## 2021-03-18 NOTE — TOC Progression Note (Signed)
Transition of Care [facility]) - Progression Note    Patient Details  Name: [name] MRN: [id number] Date of Birth: 1940-12-25  [facility]) CM/SW Contact  Creta Dorame, [place] Broom, [place] Phone Number: 03/18/2021, 2:49 PM  Clinical Narrative:     CSW spoke to palliative and family is considering having patient return to [name] with hospice services in place with [facility].  Patient is currently being followed by [facility] for outpatient palliative, however they would like to switch to hospice services at [facility].  CSW to follow up with [name] tomorrow to see if they can accept patient back with hospice services.  CSW to continue to follow patient's progress throughout discharge planning.   Expected Discharge Plan: Assisted Living Barriers to Discharge: Continued Medical Work up  Expected Discharge Plan and Services Expected Discharge Plan: Assisted Living   Discharge Planning Services: CM Consult   Living arrangements for the past 2 months: Assisted Living Facility                                       Social Determinants of Health (SDOH) Interventions    Readmission Risk Interventions No flowsheet data found.

## 2021-03-19 ENCOUNTER — Telehealth: Payer: Self-pay

## 2021-03-19 DIAGNOSIS — Z515 Encounter for palliative care: Secondary | ICD-10-CM

## 2021-03-19 MED ORDER — RIVASTIGMINE 4.6 MG/24HR TD PT24
4.6000 mg | MEDICATED_PATCH | Freq: Every day | TRANSDERMAL | Status: DC
  Administered 2021-03-19 – 2021-03-22 (×4): 4.6 mg via TRANSDERMAL
  Filled 2021-03-19 (×4): qty 1

## 2021-03-19 NOTE — Progress Notes (Signed)
[facility] Progress Note  Patient: [name]    ZTI:[id number]  DOA: 03/13/2021    Date of Service: the patient was seen and examined on 03/19/2021  Brief hospital course: 11/29, admitted with worsening mentation, fever and sepsis. 11/30 RVP positive for rhinovirus. 12/1 mentation improving, not able to follow commands.  Speech evaluation still pending. 12/3, mentation waxing and waning.  Palliative care consulted.  Transition to complete comfort. 12/4 family meeting with palliative care and myself. Plan discussed is to go back to [name] with hospice support.  Assessment and Plan: * Severe sepsis ([place]) due to rhinovirus infection Presents with fall.  Found to have worsening confusion.  Also has fever tachycardia. Met SIRS criteria on admission.  With worsening mentation organ damage. Fever improving with scheduled Toradol. Remains tachypneic.  Upper airway wheezing.  With poor secretion clearance. Chest x-ray negative.  CT abdomen negative for any acute abnormality. Blood cultures so far negative. Rhinovirus test is positive.  Influenza and COVID-negative.  Acute metabolic encephalopathy Possibly delirium secondary to sepsis. Currently mentation improving but still not back to baseline. At baseline the patient is able to ambulate, feed himself.  Concern for serotonin syndrome therefore Lexapro was on hold, ruled out, thus medication resumed on 12/3. Continue Ativan as needed. Metabolic work-up including TSH, ammonia level normal.  VBG did not show any hypercarbia.  LFTs normal.  EKG unremarkable.  CT head negative for any acute intracranial abnormality.  Blood culture and urine culture also negative.  Cellulitis of right lower leg Treated with IV vancomycin and Fortaz.  Transition to doxycycline.  Completed 5-day treatment therapy. So far cultures are negative.  Cellulitis has resolved.  Moderate dementia with behavioral disturbance ([place]) Along with  delirium. On Exelon patch, continue per family request. If he required medication for agitation Zyprexa would be a better choice.  Asthma, chronic, unspecified asthma severity, uncomplicated Continue with respiratory distress.  Continue with nebulizer therapy with Xopenex due to poor compliance with inhaler therapy.  Inadequate oral nutritional intake Patient with ongoing delirium.  Progressive dementia.  Not hungry but when offered food will take a few bites.  Remains at risk for not meeting nutritional demand. Not a good candidate for feeding tube placement secondary to his dementia which is likely the cause of his anorexia as well as dysphagia.  Family also not keen on feeding tube or prolonging his suffering.  If agitation remains an issue though, Zyprexa can be added which might help with agitation and improve appetite  Goals of care, counseling/discussion Multiple discussion with the family.  Palliative care consulted. Goal is to focus on comfort.  Transition to comfort care for now with transition to [name] with [facility] hospice.  Monitor for patient progression at [name] once back in familiar environment.  Hip pain On admission patient reports any pain while moving his right leg. X-ray had some concerns regarding hip fracture which was ruled out on CT scan.  Discussed with radiology personally.  HLD (hyperlipidemia) Hold statin  HTN (hypertension) Blood pressure stable.  Continue losartan.  Hypomagnesemia Replaced  Constipation Currently resolved  CAD (coronary artery disease) No evidence of acute ischemia.  Monitor.  Hypophosphatemia Replaced   Myoclonic jerking Associated with confusion. EEG negative for any seizure or epileptogenic foci. Intermittent in nature likely associated with encephalopathy. There was some initial concern for serotonin syndrome although appears that the patient is suffering from rhinovirus infection which might be  responsible for delirium. Currently comfort care.  Continue Ativan  as needed.    Body mass index is 26.85 kg/m.  Nutrition Problem: Inadequate oral intake Etiology: decreased appetite, inability to eat     Subjective: Remains confused, in respiratory distress.  Intermittently agitated.  Unable to carry out conversation in a meaningful way.  Objective: Hypertensive.  Exam: General: Appear in mild distress, no Rash; Oral Mucosa clear. no Abnormal Neck Mass Or lumps, Conjunctiva normal  Cardiovascular: S1 and S2 Present, no Murmur Respiratory: increased respiratory effort, Bilateral Air entry present and no Crackles, bilateral expiratory wheezes Abdomen: Bowel Sound present, Soft and no tenderness Extremities: trace Pedal edema Neurology: alert and oriented to time, place, and person affect appropriate. no new focal deficit Gait not checked due to patient safety concerns      Data Reviewed: There are no new results to review at this time.  Disposition:  Status is: Inpatient  Remains inpatient appropriate because: Unsafe discharge.  Discharge to [name] with hospice  Family Communication: Wife at bedside.  DVT Prophylaxis: SCDs Start: 03/14/21 0134   Time spent: 35 minutes.   Author: Berle Mull  03/19/2021 7:42 PM  To reach On-call, see care teams to locate the attending and reach out via [email or web address]. Between 7PM-7AM, please contact night-coverage If you still have difficulty reaching the attending provider, please page the [facility] (Director on Call) for [facility] on amion for assistance.

## 2021-03-19 NOTE — Telephone Encounter (Signed)
(  1:25 pm) SW returned call to patient's daughter. She advised that patient is still in the hospital following a fall on 03/13/21. She advised that the facility advised her to reach out about a hospice order. SW advised her that patient is being followed by the hospital liaison team who will facilitate getting the hospice order. SW advised her that the hospice or hospital team will be following up with her soon.  No other concerns noted. Palliative care RN updated.

## 2021-03-19 NOTE — Progress Notes (Signed)
Chaplain engaged in an initial visit with [name] and his wife, [name].  Chaplain was able to learn about Jay's healthcare journey as well as about his family and life.  [name] shared that they have been married since she was 80 years old.  Culturally, they had an arranged marriage.  They share two daughters together and moved to the [place] in the early 70's.  [name] went to school and lived in [place] for some time too.  [name] stated that she was able to be a stay at home mom and take care of the home while [name] provided for them all.    [name] and her daughters have currently been weighing their options concerning Jay's care.  They know that they do not want him to suffer or be uncomfortable.  They are waiting to see when he will be released back to his memory care facility and what will happen from there.  [name] expressed being thankful that [name] could come to the hospital and get the necessary blood tests and work done to see what is happening to his body.    Chaplain offered listening, presence, and support.      03/19/21 1200  Clinical Encounter Type  Visited With Patient and family together  Visit Type Initial;Spiritual support  Spiritual Encounters  Spiritual Needs Prayer

## 2021-03-19 NOTE — Progress Notes (Addendum)
[facility] [address] [facility]) Hospital Liaison Note:  Received request from [facility] for hospice services at [facility] after discharge.  Chart and patient information under review by [facility] physician. Hospice eligibility pending at this time.   Spoke with [name] (patient wife) at bedside to initiate education related to hospice philosophy, services and team approach to care. [name] verbalized understanding of information given. Per discussion, the plan is for discharge to [name] tomorrow via EMS.   DME needs discussed and patient family requests a hospital bed and wheelchair to be delivered to [name]. Contact is wife [name] at [phone].  Please send signed and completed DNR with patient upon discharge.  Please provide prescriptions at discharge as needed to ensure ongoing symptom management.   [facility] information and contact number given to patient wife during visit.   Please call for any hospice related needs,  Thank you for the opportunity to participate in this patient's care,  Gar Ponto, RN [facility] HLT (on [place]) [phone]   UPDATE: PATIENT IS HOSPICE ELIGIBLE PER DR. [facility]

## 2021-03-19 NOTE — Telephone Encounter (Signed)
Left message for Barnett Applebaum RN and for Michael/RCC at facility to follow up with message left.

## 2021-03-19 NOTE — TOC Progression Note (Addendum)
Transition of Care [facility]) - Progression Note    Patient Details  Name: [name] MRN: [id number] Date of Birth: 25-Aug-1940  Transition [facility]) CM/SW Contact  Ross Ludwig, [place] Phone Number: 03/19/2021, 1:08 PM  Clinical Narrative:     CSW spoke to patient's wife and daughter.  Patient is currently followed by [facility] for outpatient palliative and would like [facility] for hospice at home at [facility].  CSW spoke to [facility] and gave referral for hospice at [facility].  Per patient's wife they will need a hospital bed and wheelchair.  CSW updated [facility], they will meet with family to complete paperwork.  Patient's family is requesting equipment to be delivered today and plan for discharge tomorrow as long as he is medically ready for discharge.  CSW updated attending physician and palliative medicine, patient will need EMS transport back home.  Per [name], they will need updated FL2 with discharge medications and discharge summary faxed to [place] at [phone].  Expected Discharge Plan: Assisted Living Barriers to Discharge: Continued Medical Work up  Expected Discharge Plan and Services Expected Discharge Plan: Assisted Living   Discharge Planning Services: CM Consult   Living arrangements for the past 2 months: Assisted Living Facility                                       Social Determinants of Health (SDOH) Interventions    Readmission Risk Interventions No flowsheet data found.

## 2021-03-19 NOTE — Telephone Encounter (Signed)
Phone call placed to daughter, Mearl Latin, to follow up on patient. Per Mearl Latin, patient is ready to transition to hospice services at [name]. Patient will need a hospital bed and wheelchair prior to d/c back to [name]. Will follow up with liaison team and [name].

## 2021-03-19 NOTE — Progress Notes (Signed)
Daily Progress Note   Patient Name: [name]       Date: 03/19/2021 DOB: [birth year]  Age: 80 y.o. MRN#: [id number] Attending Physician: Lavina Hamman, MD Primary Care Physician: Sueanne Margarita, DO Admit Date: 03/13/2021  Reason for Consultation/Follow-up: Establishing goals of care  Subjective: I saw and examined [name] and talked briefly with his wife.  We discussed hope that he can return to [name] with hospice support.  LCSW is reaching out to them to discuss.  Denies any other needs at this time.   Length of Stay: 6  Current Medications: Scheduled Meds:  . chlorhexidine  15 mL Mouth Rinse BID  . escitalopram  20 mg Oral Daily  . feeding supplement  237 mL Oral BID BM  . levalbuterol  0.63 mg Nebulization TID  . losartan  50 mg Oral Daily  . mouth rinse  15 mL Mouth Rinse q12n4p  . rivastigmine  4.6 mg Transdermal Daily    Continuous Infusions:   PRN Meds: acetaminophen **OR** acetaminophen, benzonatate, bisacodyl, diphenhydrAMINE, food thickener, glycopyrrolate **OR** glycopyrrolate **OR** glycopyrrolate, guaiFENesin-dextromethorphan, haloperidol **OR** haloperidol **OR** haloperidol lactate, levalbuterol, LORazepam **OR** LORazepam **OR** LORazepam, LORazepam, morphine injection, morphine CONCENTRATE **OR** morphine CONCENTRATE, naphazoline-glycerin, ondansetron **OR** ondansetron (ZOFRAN) IV  Physical Exam         General: Alert, awake, in no acute distress.  Confused. HEENT: No bruits, no goiter, no JVD Lungs: No increased work of breathing.  Good air movement. Abdomen:  nondistended Skin: Warm and dry  Vital Signs: BP (!) 168/139 (BP Location: Other (Comment)) Comment (BP Location): L leg  Pulse 84   Temp 97.9 F (36.6 C) (Axillary)   Resp  20   Ht 5\' 8"  (1.727 m)   Wt 80.1 kg   SpO2 90%   BMI 26.85 kg/m  SpO2: SpO2: 90 % O2 Device: O2 Device: Room Air O2 Flow Rate: O2 Flow Rate (L/min): 2 L/min  Intake/output summary: No intake or output data in the 24 hours ending 03/19/21 2112 LBM: Last BM Date: 03/18/21 Baseline Weight: Weight: 77 kg Most recent weight: Weight: 80.1 kg       Palliative Assessment/Data:      Patient Active Problem List   Diagnosis Date Noted  . Inadequate oral nutritional intake 03/18/2021  . Myoclonic jerking 03/17/2021  . Cellulitis  of right lower leg 03/14/2021  . Constipation 03/14/2021  . Hypomagnesemia 03/14/2021  . Hypophosphatemia 03/14/2021  . Acute metabolic encephalopathy 11/91/4782  . Hip pain 03/14/2021  . Goals of care, counseling/discussion 03/14/2021  . Severe sepsis ([place]) due to rhinovirus infection 03/13/2021  . Discoloration of nail 06/21/2019  . Hammer toe, acquired 10/27/2018  . Syncope 03/28/2018  . Genetic testing 10/30/2017  . Family history of ovarian cancer   . History of prostate cancer   . History of melanoma   . Moderate dementia with behavioral disturbance ([place]) 03/17/2017  . Hoarseness of voice 02/26/2017  . 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  . 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 Sleep behavior disorder, REM 06/11/2016  . TIA due to embolism ([place]) 06/11/2016  . History of heart artery stent 06/11/2016  . Subungual malignant melanoma ([place]) 06/19/2015  . Nonrheumatic aortic valve insufficiency 06/15/2015  . Malignant melanoma of finger ([place]) 06/02/2015  . Gastroesophageal reflux disease 01/10/2015  . Mild persistent asthma 01/10/2015  . HLD (hyperlipidemia) 01/10/2015  . ED (erectile dysfunction) 11/13/2014  . Lower urinary tract symptoms (LUTS) 11/13/2014  . Cancer of prostate with low recurrence risk (stage T1-2a and Gleason < 7 and PSA < 10) ([place]) 11/03/2014  . Elevated prostate specific antigen (PSA) 09/30/2014  .  Asthma, moderate persistent, well-controlled 08/08/2014  . Dyspnea 01/27/2014  . Asthma, chronic, unspecified asthma severity, uncomplicated 95/62/1308  . Hypotension, unspecified 07/15/2013  . Hypotension 07/15/2013  . Dyspnea and respiratory abnormality 07/13/2013  . Dysphonia 05/08/2011  . S/P MVR (mitral valve repair) 01/14/2011  . HTN (hypertension)   . Osteoporosis   . CAD (coronary artery disease)   . Mitral valve prolapse with severe mitral regurgitation   . Diaphragm paralysis 02/09/2010  . Hypertension, essential 02/05/2010  . OSTEOPOROSIS 02/05/2010  . CARDIAC MURMUR, SYSTOLIC 65/78/4696  . COUGH 02/05/2010    Palliative Care Assessment & Plan   Patient Profile: [name] is an 80 year old male with past medical history of asthma, CAD, prostate cancer, GERD, hypertension, TIA, and memory impairment who is a long-term resident at [name].  He is currently admitted with sepsis related to rhinovirus infection, cellulitis to lower extremity, and encephalopathy.  His intake has remained poor and his mental status waxes and wanes.  Recommendations/Plan: DNR/DNI [facility].  Appreciate TOC assistance appreciated.  Goals of Care and Additional Recommendations: Limitations on Scope of Treatment: Full Comfort Care  Code Status:    Code Status Orders  (From admission, onward)           Start     Ordered   03/17/21 1212  Do not attempt resuscitation (DNR)  Continuous       Question Answer Comment  In the event of cardiac or respiratory ARREST Do not call a "code blue"   In the event of cardiac or respiratory ARREST Do not perform Intubation, CPR, defibrillation or ACLS   In the event of cardiac or respiratory ARREST Use medication by any route, position, wound care, and other measures to relive pain and suffering. May use oxygen, suction and manual treatment of airway obstruction as needed for comfort.      03/17/21 1214            Code Status History     Date Active Date Inactive Code Status Order ID Comments User Context   03/14/2021 0135 03/17/2021 1214 DNR [id number]  Toy Baker, MD Inpatient   03/13/2021 2119 03/14/2021  7530 DNR [id number]  Toy Baker, MD ED   03/28/2018 1349 03/29/2018 1500 Full Code [id number]  Caren Griffins, MD ED   07/15/2013 1629 07/18/2013 1809 Full Code [id number]  [name], MD Inpatient      Advance Directive Documentation    Flowsheet Row Most Recent Value  Type of Advance Directive Out of facility DNR (pink MOST or yellow form)  Pre-existing out of facility DNR order (yellow form or pink MOST form) Yellow form placed in chart (order not valid for inpatient use)  "MOST" Form in Place? --       Prognosis:  < 6 months  Discharge Planning: Assisted living facility with hospice support  Care plan was discussed with family including patient's wife  Thank you for allowing the Palliative Medicine Team to assist in the care of this patient.   Total Time 20 Prolonged Time Billed No   Greater than 50%  of this time was spent counseling and coordinating care related to the above assessment and plan.  Micheline Rough, MD  Please contact Palliative Medicine Team phone at [phone] for questions and concerns.

## 2021-03-20 DIAGNOSIS — Z515 Encounter for palliative care: Secondary | ICD-10-CM

## 2021-03-20 HISTORY — DX: Encounter for palliative care: Z51.5

## 2021-03-20 LAB — SARS CORONAVIRUS 2 BY RT PCR (HOSPITAL ORDER, PERFORMED IN ~~LOC~~ HOSPITAL LAB): SARS Coronavirus 2: NEGATIVE

## 2021-03-20 MED ORDER — CEFDINIR 300 MG PO CAPS
300.0000 mg | ORAL_CAPSULE | Freq: Two times a day (BID) | ORAL | 0 refills | Status: AC
Start: 2021-03-20 — End: 2021-03-25

## 2021-03-20 MED ORDER — LEVALBUTEROL HCL 0.63 MG/3ML IN NEBU: 0.6300 mg | INHALATION_SOLUTION | Freq: Four times a day (QID) | RESPIRATORY_TRACT | 0 refills | Status: DC

## 2021-03-20 MED ORDER — BENZONATATE 100 MG PO CAPS: 100.0000 mg | ORAL_CAPSULE | Freq: Three times a day (TID) | ORAL | 0 refills | Status: DC | PRN

## 2021-03-20 MED ORDER — OLANZAPINE 2.5 MG PO TABS: 2.5000 mg | ORAL_TABLET | Freq: Every day | ORAL | 0 refills | Status: DC

## 2021-03-20 MED ORDER — OLANZAPINE 5 MG PO TABS
2.5000 mg | ORAL_TABLET | Freq: Every day | ORAL | Status: DC
  Administered 2021-03-20 – 2021-03-22 (×3): 2.5 mg via ORAL
  Filled 2021-03-20 (×4): qty 1

## 2021-03-20 MED ORDER — ENSURE ENLIVE PO LIQD: 237.0000 mL | Freq: Two times a day (BID) | ORAL | 0 refills | Status: AC

## 2021-03-20 MED ORDER — CEFDINIR 300 MG PO CAPS
300.0000 mg | ORAL_CAPSULE | Freq: Two times a day (BID) | ORAL | Status: DC
  Administered 2021-03-20 – 2021-03-22 (×5): 300 mg via ORAL
  Filled 2021-03-20 (×5): qty 1

## 2021-03-20 MED ORDER — LEVALBUTEROL HCL 0.63 MG/3ML IN NEBU
0.6300 mg | INHALATION_SOLUTION | Freq: Four times a day (QID) | RESPIRATORY_TRACT | Status: DC
  Administered 2021-03-20 – 2021-03-22 (×10): 0.63 mg via RESPIRATORY_TRACT
  Filled 2021-03-20 (×10): qty 3

## 2021-03-20 MED ORDER — HALOPERIDOL 2 MG PO TABS: 2.0000 mg | ORAL_TABLET | ORAL | 0 refills | Status: DC | PRN

## 2021-03-20 MED ORDER — GUAIFENESIN-DM 100-10 MG/5ML PO SYRP
5.0000 mL | ORAL_SOLUTION | ORAL | 0 refills | Status: DC | PRN
Start: 2021-03-20 — End: 2021-08-31

## 2021-03-20 MED ORDER — ATROPINE SULFATE 1 % OP SOLN: 1.0000 [drp] | Freq: Three times a day (TID) | OPHTHALMIC | 0 refills | Status: DC | PRN

## 2021-03-20 MED ORDER — BISACODYL 10 MG RE SUPP: 10.0000 mg | Freq: Every day | RECTAL | 0 refills | Status: DC | PRN

## 2021-03-20 MED ORDER — MORPHINE SULFATE (CONCENTRATE) 10 MG/0.5ML PO SOLN: 5.0000 mg | ORAL | 0 refills | Status: DC | PRN

## 2021-03-20 MED ORDER — METRONIDAZOLE 500 MG PO TABS
500.0000 mg | ORAL_TABLET | Freq: Two times a day (BID) | ORAL | Status: DC
  Administered 2021-03-20 – 2021-03-22 (×5): 500 mg via ORAL
  Filled 2021-03-20 (×5): qty 1

## 2021-03-20 MED ORDER — IPRATROPIUM BROMIDE 0.02 % IN SOLN
0.5000 mg | Freq: Four times a day (QID) | RESPIRATORY_TRACT | Status: DC
  Administered 2021-03-20 – 2021-03-22 (×10): 0.5 mg via RESPIRATORY_TRACT
  Filled 2021-03-20 (×10): qty 2.5

## 2021-03-20 MED ORDER — LORAZEPAM 1 MG PO TABS
0.5000 mg | ORAL_TABLET | ORAL | 0 refills | Status: DC | PRN
Start: 2021-03-20 — End: 2021-08-31

## 2021-03-20 MED ORDER — METRONIDAZOLE 500 MG PO TABS: 500.0000 mg | ORAL_TABLET | Freq: Two times a day (BID) | ORAL | 0 refills | Status: AC

## 2021-03-20 NOTE — Progress Notes (Addendum)
[facility] Goals of care conversation.  Patient: [name] JQB:[id number]   PCP: Sueanne Margarita, DO DOB: 26-Apr-1940   DOA: 03/13/2021   DOS: 03/20/2021    Multiple conversations with family today with regards to goal of care. Also discussed care plan with PCP. Discussed care plan with palliative care as well as RN TOC. With paperwork was ready to transfer the patient to [name] due to worsening mentation discharge was held for another day.  Summary:  -Maintain comfort as priority -Treat agitation and possible aspiration. -Range of motion exercises as tolerated with ancillary staff. -Continue current diet despite aspiration risk. -Transition to [name] as soon as possible with hospice with hope that patient's delirium will do better in familiar environment and nutrition may pick up as well. -Continue hospice on discharge at [name].  Received a call at the family's concern regarding patient's mentation and wheezing. Palliative care was evaluated the patient at the bedside.  Family hopes for some degree of stabilization and recovery. I evaluated the patient at bedside had discussion with both daughters on the phone. Patient remains agitated, audibly wheezing intermittent tremors without any myoclonic jerks.  Patient was lethargic at the time of my evaluation but mentation improved after my evaluation.  Hallucinating, talking to people on the phone as if he is at work.  Xopenex and ipratropium therapy was given without any improvement in wheezing.  Options were discussed with regards to care pathways.  Family was primary goal remains comfort.  But family thinks that the patient has some more life in him and would like to see if there is any hope for recovery.  Family recalled that prior to the admission patient was ambulating, not agitated on Exelon patch and without any hallucination.  Family was wondering if IV fluids will be helpful on IV nutrition will be  helpful.  I explained that the patient is not a candidate for IV TPN or permanent feeding tube placement.  Temporary feeding tube is an option that can be considered but most likely will end up with restraints due to discomfort that the tube does create. Family wanted to continue with current diet.  Family was hoping for some range of motion activities to keep his strength.  They were hoping that if upright posture will help with secretion clearance.  Offered options for chest vest versus Robinul for secretion management.  Patient with significant disequilibrium and lack of coordination.  Remains at high fall risk and thus until his agitation is better not a good candidate for unsupervised activity.  Given that he is unable to follow commands, not a good candidate for physical therapy right now as well. Family agreeable to pursue range of motion activity with ancillary staff.  Discussed the option to treat what is treatable within the limitation of DNR and DNI to stabilize the patient which would include lab work, chest x-ray, potentially antibiotic, steroids and returning to restricted diet status.  May also involve neurology consultation.  Family decided to maintain comfort care.  Discussed the option of Zyprexa or any other atypical antipsychotic with the expectation of improvement in delirium and agitation as well as minor improvement in appetite on that medication.  Explained to the patient's family that on comfort care pathway ideally patient should receive some Robinul as well as morphine for his respiratory distress and may require aggressive symptom control otherwise.  There is high chance that the patient will qualify for residential hospice.  Family requested to discuss with patient's  other provider Dr. Delice Lesch and Dr. Francesco Sor.  I left a message to both office.  Appreciate assistance of Dr. Francesco Sor.  Who has discussed potential side effects of Zyprexa with the family.  There was some concern  that the patient may be having rivastigmine withdrawal.  But this is less likely given that the only day the patient did not receive rivastigmine was on the day of admission and 12/4.  Patient also was delirious prior to presentation when he would be receiving his rivastigmine patch and remained delirious throughout the hospital stay.  Shared decision making was done between PCP, family and myself.  Plan is to see if respiratory distress improves with addition of antibiotics.  If needed use Robinul for secretion management and add 2.5 mg Zyprexa nightly for agitation, hallucination, delirium control.  Family is aware with regards to Exelon patch side effects which includes agitation, decreased appetite, nausea, diarrhea and could potentially counteract anticholinergic medications like Robinul.  And thus eventually Exelon patch will require discontinuation.  Palliative care team was also updated with the care plan.  Author: Berle Mull, MD [facility] 03/20/2021 6:44 PM   If 7PM-7AM, please contact night-coverage at [email or web address]

## 2021-03-20 NOTE — Assessment & Plan Note (Signed)
Palliative care consulted, pt will transition to hospice on discharge at [facility] with [facility].

## 2021-03-20 NOTE — NC FL2 (Signed)
[place] LEVEL OF CARE SCREENING TOOL     IDENTIFICATION  Patient Name: [name] Birthdate: 26-Jul-1940 Sex: male Admission Date (Current Location): 03/13/2021  [facility] and [place] Number:  Herbalist and Address:  [facility],  [place], [place]      Provider Number: [id number]  Attending Physician Name and Address:  Lavina Hamman, MD  Relative Name and Phone Number:  Aryn, [place] [phone]  [place] Daughter [phone]  [phone]    Current Level of Care: Hospital Recommended Level of Care: [place], [facility]) Prior Approval Number:    Date Approved/Denied:   PASRR Number:    Discharge Plan: Domiciliary (Rest home) ([name])    Current Diagnoses: Patient Active Problem List   Diagnosis Date Noted   Hospice care patient 03/20/2021   Inadequate oral nutritional intake 03/18/2021   Myoclonic jerking 03/17/2021   Cellulitis of right lower leg 03/14/2021   Constipation 03/14/2021   Hypomagnesemia 03/14/2021   Hypophosphatemia 51/88/4166   Acute metabolic encephalopathy 10/13/1599   Hip pain 03/14/2021   Goals of care, counseling/discussion 03/14/2021   Severe sepsis ([place]) due to rhinovirus infection 03/13/2021   Discoloration of nail 06/21/2019   Hammer toe, acquired 10/27/2018   Syncope 03/28/2018   Genetic testing 10/30/2017   Family history of ovarian cancer    History of prostate cancer    History of melanoma    Moderate dementia with behavioral disturbance ([place]) 03/17/2017   Hoarseness of voice 02/26/2017   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   Amnestic MCI (mild cognitive impairment with memory loss) 06/11/2016   Sleep behavior disorder, REM 06/11/2016   TIA due to embolism ([place]) 06/11/2016   History of heart artery stent 06/11/2016   Subungual malignant melanoma ([place]) 06/19/2015   Nonrheumatic aortic valve  insufficiency 06/15/2015   Malignant melanoma of finger ([name]) 06/02/2015   Gastroesophageal reflux disease 01/10/2015   Mild persistent asthma 01/10/2015   HLD (hyperlipidemia) 01/10/2015   ED (erectile dysfunction) 11/13/2014   Lower urinary tract symptoms (LUTS) 11/13/2014   Cancer of prostate with low recurrence risk (stage T1-2a and Gleason < 7 and PSA < 10) ([place]) 11/03/2014   Elevated prostate specific antigen (PSA) 09/30/2014   Asthma, moderate persistent, well-controlled 08/08/2014   Dyspnea 01/27/2014   Asthma, chronic, unspecified asthma severity, uncomplicated 09/32/3557   Hypotension, unspecified 07/15/2013   Hypotension 07/15/2013   Dyspnea and respiratory abnormality 07/13/2013   Dysphonia 05/08/2011   S/P MVR (mitral valve repair) 01/14/2011   HTN (hypertension)    Osteoporosis    CAD (coronary artery disease)    Mitral valve prolapse with severe mitral regurgitation    Diaphragm paralysis 02/09/2010   Hypertension, essential 02/05/2010   OSTEOPOROSIS 02/05/2010   CARDIAC MURMUR, SYSTOLIC 32/20/2542   COUGH 02/05/2010    Orientation RESPIRATION BLADDER Height & Weight     Self  Normal Incontinent Weight: 176 lb 9.4 oz (80.1 kg) Height:  5\' 8"  (172.7 cm)  BEHAVIORAL SYMPTOMS/MOOD NEUROLOGICAL BOWEL NUTRITION STATUS      Incontinent Diet  AMBULATORY STATUS COMMUNICATION OF NEEDS Skin   Extensive Assist Verbally Normal                       Personal Care Assistance Level of Assistance  Bathing, Dressing, Feeding Bathing Assistance: Maximum assistance Feeding assistance: Maximum assistance Dressing Assistance: Maximum assistance     Functional Limitations Info  Sight, Hearing, Speech  Sight Info: Adequate Hearing Info: Adequate Speech Info: Adequate    SPECIAL CARE FACTORS FREQUENCY                       Contractures Contractures Info: Not present    Additional Factors Info  Code Status, Allergies, Psychotropic Code Status Info:  DNR Allergies Info: Aricept (Donepezil)   Depakote (Divalproex Sodium)   Penicillins Psychotropic Info: escitalopram (LEXAPRO) tablet 20 mg,         Current Medications (03/20/2021):  This is the current hospital active medication list Current Facility-Administered Medications  Medication Dose Route Frequency Provider Last Rate Last Admin   acetaminophen (TYLENOL) tablet 650 mg  650 mg Oral Q6H PRN Lavina Hamman, MD       Or   acetaminophen (TYLENOL) suppository 650 mg  650 mg Rectal Q6H PRN Lavina Hamman, MD       benzonatate (TESSALON) capsule 100 mg  100 mg Oral TID PRN Lavina Hamman, MD       bisacodyl (DULCOLAX) suppository 10 mg  10 mg Rectal Daily PRN Lavina Hamman, MD       chlorhexidine (PERIDEX) 0.12 % solution 15 mL  15 mL Mouth Rinse BID Lavina Hamman, MD   15 mL at 03/19/21 2231   diphenhydrAMINE (BENADRYL) injection 12.5 mg  12.5 mg Intravenous Q4H PRN Lavina Hamman, MD       escitalopram (LEXAPRO) tablet 20 mg  20 mg Oral Daily Lavina Hamman, MD   20 mg at 03/20/21 0849   feeding supplement (ENSURE ENLIVE / ENSURE PLUS) liquid 237 mL  237 mL Oral BID BM Lavina Hamman, MD   237 mL at 03/20/21 0850   food thickener (SIMPLYTHICK (NECTAR/LEVEL 2/MILDLY THICK)) 1 packet  1 packet Oral PRN Lavina Hamman, MD       glycopyrrolate (ROBINUL) tablet 1 mg  1 mg Oral Q4H PRN Lavina Hamman, MD       Or   glycopyrrolate (ROBINUL) injection 0.2 mg  0.2 mg Subcutaneous Q4H PRN Lavina Hamman, MD       Or   glycopyrrolate (ROBINUL) injection 0.2 mg  0.2 mg Intravenous Q4H PRN Lavina Hamman, MD       guaiFENesin-dextromethorphan (ROBITUSSIN DM) 100-10 MG/5ML syrup 5 mL  5 mL Oral Q4H PRN Lavina Hamman, MD   5 mL at 03/19/21 2238   haloperidol (HALDOL) tablet 2 mg  2 mg Oral Q4H PRN Lavina Hamman, MD       Or   haloperidol (HALDOL) 2 MG/ML solution 2 mg  2 mg Sublingual Q4H PRN Lavina Hamman, MD       Or   haloperidol lactate (HALDOL) injection 2 mg  2 mg  Intravenous Q4H PRN Lavina Hamman, MD       levalbuterol Penne Lash) nebulizer solution 0.63 mg  0.63 mg Nebulization TID Lavina Hamman, MD   0.63 mg at 03/20/21 0758   levalbuterol (XOPENEX) nebulizer solution 0.63 mg  0.63 mg Nebulization Q3H PRN Lavina Hamman, MD   0.63 mg at 03/20/21 9735   LORazepam (ATIVAN) tablet 1 mg  1 mg Oral Q4H PRN Lavina Hamman, MD   1 mg at 03/20/21 3299   Or   LORazepam (ATIVAN) 2 MG/ML concentrated solution 1 mg  1 mg Sublingual Q4H PRN Lavina Hamman, MD       Or   LORazepam (ATIVAN) injection 1 mg  1 mg Intravenous Q4H PRN Lavina Hamman, MD   1 mg at 03/18/21 0030   LORazepam (ATIVAN) injection 1 mg  1 mg Intravenous Q4H PRN Lavina Hamman, MD   1 mg at 03/17/21 1842   losartan (COZAAR) tablet 50 mg  50 mg Oral Daily Lavina Hamman, MD   50 mg at 03/20/21 0849   MEDLINE mouth rinse  15 mL Mouth Rinse q12n4p Lavina Hamman, MD   15 mL at 03/19/21 1336   morphine 2 MG/ML injection 2-4 mg  2-4 mg Intravenous Q2H PRN Lavina Hamman, MD       morphine CONCENTRATE 10 MG/0.5ML oral solution 5 mg  5 mg Oral Q2H PRN Lavina Hamman, MD       Or   morphine CONCENTRATE 10 MG/0.5ML oral solution 5 mg  5 mg Sublingual Q2H PRN Lavina Hamman, MD       naphazoline-glycerin (CLEAR EYES REDNESS) ophth solution 1-2 drop  1-2 drop Both Eyes QID PRN Lavina Hamman, MD   1 drop at 03/14/21 1845   ondansetron (ZOFRAN-ODT) disintegrating tablet 4 mg  4 mg Oral Q6H PRN Lavina Hamman, MD       Or   ondansetron [facility]) injection 4 mg  4 mg Intravenous Q6H PRN Lavina Hamman, MD       rivastigmine (EXELON) 4.6 mg/24hr 4.6 mg  4.6 mg Transdermal Daily Lavina Hamman, MD   4.6 mg at 03/20/21 1937     Discharge Medications: STOP taking these medications     atorvastatin 10 MG tablet Commonly known as: LIPITOR           TAKE these medications     aspirin 81 MG tablet Take 81 mg by mouth daily.    atropine 1 % ophthalmic solution Place 1 drop under the  tongue 3 (three) times daily as needed (excessive secretion).    benzonatate 100 MG capsule Commonly known as: TESSALON Take 1 capsule (100 mg total) by mouth 3 (three) times daily as needed for cough.    bisacodyl 10 MG suppository Commonly known as: DULCOLAX Place 1 suppository (10 mg total) rectally daily as needed for moderate constipation.    calcium carbonate 500 MG chewable tablet Commonly known as: TUMS - dosed in mg elemental calcium Chew 1 tablet by mouth daily.    EQ MULTIVITAMINS ADULT GUMMY PO Take 1 tablet by mouth daily.    escitalopram 20 MG tablet Commonly known as: Lexapro Take 1 tablet (20 mg total) by mouth daily.    feeding supplement Liqd Take 237 mLs by mouth 2 (two) times daily between meals.    guaiFENesin-dextromethorphan 100-10 MG/5ML syrup Commonly known as: ROBITUSSIN DM Take 5 mLs by mouth every 4 (four) hours as needed for cough.    haloperidol 2 MG tablet Commonly known as: HALDOL Take 1 tablet (2 mg total) by mouth every 4 (four) hours as needed for agitation (or delirium).    LORazepam 1 MG tablet Commonly known as: ATIVAN Take 0.5-1 tablets (0.5-1 mg total) by mouth every 4 (four) hours as needed for anxiety.    losartan 50 MG tablet Commonly known as: COZAAR Take 50 mg by mouth daily.    MELATIN PO Take 3 mg by mouth every evening. Takes usually around at 6pm    morphine CONCENTRATE 10 MG/0.5ML Soln concentrated solution Take 0.25 mLs (5 mg total) by mouth every 2 (two) hours as needed for moderate pain, severe pain or  shortness of breath (or dyspnea).    NON FORMULARY Take 5 mLs by mouth 2 (two) times daily. Medication CBD oil BID    polyethylene glycol powder 17 GM/SCOOP powder Commonly known as: GLYCOLAX/MIRALAX Take 17 g by mouth daily.    REFRESH EYE ITCH RELIEF OP Apply to eye.    rivastigmine 4.6 mg/24hr Commonly known as: EXELON Place 1 patch (4.6 mg total) onto the skin daily.    VitaJoy Daily D Gummies 25 MCG  (1000 UT) Chew Generic drug: Cholecalciferol Chew 2,000 Units by mouth.    vitamin C 1000 MG tablet Take 500 mg by mouth daily.    Relevant Imaging Results:  Relevant Lab Results:   Additional Information SSN [id number]  Ross Ludwig, LCSW

## 2021-03-20 NOTE — Progress Notes (Signed)
Daily Progress Note   Patient Name: [name]       Date: 03/20/2021 DOB: [birth year]  Age: 80 y.o. MRN#: [id number] Attending Physician: Lavina Hamman, MD Primary Care Physician: Sueanne Margarita, DO Admit Date: 03/13/2021  Reason for Consultation/Follow-up: Establishing goals of care  Subjective:  Patient noted to be not alert, has wheezing.     Length of Stay: 7  Current Medications: Scheduled Meds:  . chlorhexidine  15 mL Mouth Rinse BID  . escitalopram  20 mg Oral Daily  . feeding supplement  237 mL Oral BID BM  . levalbuterol  0.63 mg Nebulization TID  . losartan  50 mg Oral Daily  . mouth rinse  15 mL Mouth Rinse q12n4p  . rivastigmine  4.6 mg Transdermal Daily    Continuous Infusions:   PRN Meds: acetaminophen **OR** acetaminophen, benzonatate, bisacodyl, diphenhydrAMINE, food thickener, glycopyrrolate **OR** glycopyrrolate **OR** glycopyrrolate, guaiFENesin-dextromethorphan, haloperidol **OR** haloperidol **OR** haloperidol lactate, levalbuterol, LORazepam **OR** LORazepam **OR** LORazepam, LORazepam, morphine injection, morphine CONCENTRATE **OR** morphine CONCENTRATE, naphazoline-glycerin, ondansetron **OR** ondansetron (ZOFRAN) IV  Physical Exam         General: not alert, has wheezing. Confused. HEENT: No bruits, no goiter, no JVD Lungs: wheezing.  Abdomen:  nondistended Skin: Warm and dry  Vital Signs: BP (!) 168/139 (BP Location: Other (Comment)) Comment (BP Location): L leg  Pulse 84   Temp 97.9 F (36.6 C) (Axillary)   Resp 20   Ht 5\' 8"  (1.727 m)   Wt 80.1 kg   SpO2 95%   BMI 26.85 kg/m  SpO2: SpO2: 95 % O2 Device: O2 Device: Room Air O2 Flow Rate: O2 Flow Rate (L/min): 2 L/min  Intake/output summary: No intake or output data in the 24  hours ending 03/20/21 1227 LBM: Last BM Date: 03/18/21 Baseline Weight: Weight: 77 kg Most recent weight: Weight: 80.1 kg       Palliative Assessment/Data:      Patient Active Problem List   Diagnosis Date Noted  . Hospice care patient 03/20/2021  . Inadequate oral nutritional intake 03/18/2021  . Myoclonic jerking 03/17/2021  . Cellulitis of right lower leg 03/14/2021  . Constipation 03/14/2021  . Hypomagnesemia 03/14/2021  . Hypophosphatemia 03/14/2021  . Acute metabolic encephalopathy 16/60/6004  . Hip pain 03/14/2021  . Goals of care, counseling/discussion 03/14/2021  .  Severe sepsis ([place]) due to rhinovirus infection 03/13/2021  . Discoloration of nail 06/21/2019  . Hammer toe, acquired 10/27/2018  . Syncope 03/28/2018  . Genetic testing 10/30/2017  . Family history of ovarian cancer   . History of prostate cancer   . History of melanoma   . Moderate dementia with behavioral disturbance ([place]) 03/17/2017  . Hoarseness of voice 02/26/2017  . 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  . 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 Sleep behavior disorder, REM 06/11/2016  . TIA due to embolism ([place]) 06/11/2016  . History of heart artery stent 06/11/2016  . Subungual malignant melanoma ([place]) 06/19/2015  . Nonrheumatic aortic valve insufficiency 06/15/2015  . Malignant melanoma of finger ([place]) 06/02/2015  . Gastroesophageal reflux disease 01/10/2015  . Mild persistent asthma 01/10/2015  . HLD (hyperlipidemia) 01/10/2015  . ED (erectile dysfunction) 11/13/2014  . Lower urinary tract symptoms (LUTS) 11/13/2014  . Cancer of prostate with low recurrence risk (stage T1-2a and Gleason < 7 and PSA < 10) ([place]) 11/03/2014  . Elevated prostate specific antigen (PSA) 09/30/2014  . Asthma, moderate persistent, well-controlled 08/08/2014  . Dyspnea 01/27/2014  . Asthma, chronic, unspecified asthma severity, uncomplicated 16/01/9603  . Hypotension, unspecified 07/15/2013   . Hypotension 07/15/2013  . Dyspnea and respiratory abnormality 07/13/2013  . Dysphonia 05/08/2011  . S/P MVR (mitral valve repair) 01/14/2011  . HTN (hypertension)   . Osteoporosis   . CAD (coronary artery disease)   . Mitral valve prolapse with severe mitral regurgitation   . Diaphragm paralysis 02/09/2010  . Hypertension, essential 02/05/2010  . OSTEOPOROSIS 02/05/2010  . CARDIAC MURMUR, SYSTOLIC 54/12/8117  . COUGH 02/05/2010    Palliative Care Assessment & Plan   Patient Profile: Mr. Tobler is an 80 year old male with past medical history of asthma, CAD, prostate cancer, GERD, hypertension, TIA, and memory impairment who is a long-term resident at [name].  He is currently admitted with sepsis related to rhinovirus infection, cellulitis to lower extremity, and encephalopathy.  His intake has remained poor and his mental status waxes and wanes.  Recommendations/Plan: DNR/DNI Received a call request from patient's daughter, call placed and discussed on the phone with wife and daughters. We reviewed patient's current medication list and current goals of care. Family is concerned about the patient's mental status today, they are asking about PT involvement and the patient's current medications. Medication list discussed. Patient changed to regular diet fro vegetarian diet as per family's request. Overall, patient's family hopes for some degree of ongoing stabilization/recovery and then for the patient to go back to [name].    Goals of Care and Additional Recommendations: Limitations on Scope of Treatment: Full Comfort Care  Code Status:    Code Status Orders  (From admission, onward)           Start     Ordered   03/17/21 1212  Do not attempt resuscitation (DNR)  Continuous       Question Answer Comment  In the event of cardiac or respiratory ARREST Do not call a "code blue"   In the event of cardiac or respiratory ARREST Do not perform Intubation, CPR,  defibrillation or ACLS   In the event of cardiac or respiratory ARREST Use medication by any route, position, wound care, and other measures to relive pain and suffering. May use oxygen, suction and manual treatment of airway obstruction as needed for comfort.      03/17/21 1214  Code Status History     Date Active Date Inactive Code Status Order ID Comments User Context   03/14/2021 0135 03/17/2021 1214 DNR [id number]  Toy Baker, MD Inpatient   03/13/2021 2119 03/14/2021 0135 DNR [id number]  Toy Baker, MD ED   03/28/2018 1349 03/29/2018 1500 Full Code [id number]  Caren Griffins, MD ED   07/15/2013 1629 07/18/2013 1809 Full Code [id number]  [name], MD Inpatient      Advance Directive Documentation    Flowsheet Row Most Recent Value  Type of Advance Directive Out of facility DNR (pink MOST or yellow form)  Pre-existing out of facility DNR order (yellow form or pink MOST form) Yellow form placed in chart (order not valid for inpatient use)  "MOST" Form in Place? --       Prognosis:  < 6 months  Discharge Planning: Assisted living facility with hospice support  Care plan was discussed with family including patient's wife and daughters.   Thank you for allowing the Palliative Medicine Team to assist in the care of this patient.   Total Time 35 Prolonged Time Billed No   Greater than 50%  of this time was spent counseling and coordinating care related to the above assessment and plan.  Loistine Chance, MD  Please contact Palliative Medicine Team phone at [phone] for questions and concerns from 7 AM to 7 PM. After 7 PM, please call primary service.

## 2021-03-20 NOTE — TOC Transition Note (Signed)
Transition of Care [facility]) - CM/SW Discharge Note   Patient Details  Name: [name] MRN: [id number] Date of Birth: 04/30/1940  Transition of Care [facility] CM/SW Contact:  Leeroy Cha, RN Phone Number: 03/20/2021, 12:06 PM   Clinical Narrative:    Dc summary and fl2 sent to [name] via the hub.  Tct-authrocare/message left for Lenna Sciara that patient is return to [facility] today.     Barriers to Discharge: Continued Medical Work up   Patient Goals and CMS Choice Patient states their goals for this hospitalization and ongoing recovery are:: to go home CMS Medicare.gov Compare Post Acute Care list provided to:: Patient    Discharge Placement                       Discharge Plan and Services   Discharge Planning Services: CM Consult                                 Social Determinants of Health (SDOH) Interventions     Readmission Risk Interventions No flowsheet data found.

## 2021-03-20 NOTE — Care Management Important Message (Signed)
Important Message  Patient Details IM Letter placed in Patients room. Name: [name] MRN: [id number] Date of Birth: 05-Feb-1941   Medicare Important Message Given:  Yes     Kerin Salen 03/20/2021, 11:53 AM

## 2021-03-20 NOTE — Discharge Summary (Signed)
Physician Discharge Summary   Patient name: [name]  Admit date:     03/13/2021  Discharge date: 03/20/2021  Discharge Physician: Berle Mull   PCP: Sueanne Margarita, DO   Recommendations at discharge: establish care with hospice  Discharge Diagnoses Principal Problem:   Severe sepsis [facility]) due to rhinovirus infection Active Problems:   Acute metabolic encephalopathy   Asthma, chronic, unspecified asthma severity, uncomplicated   Moderate dementia with behavioral disturbance (HCC)   Cellulitis of right lower leg   Hospice care patient   Hip pain   Goals of care, counseling/discussion   Inadequate oral nutritional intake   HTN (hypertension)   HLD (hyperlipidemia)   CAD (coronary artery disease)   Constipation   Hypomagnesemia   Hypophosphatemia   Myoclonic jerking  Hospital Course   11/29, admitted with worsening mentation, fever and sepsis. 11/30 RVP positive for rhinovirus. 12/1 mentation improving, not able to follow commands.  Speech evaluation still pending. 12/3, mentation waxing and waning.  Palliative care consulted.  Transition to complete comfort. 12/4 family meeting with palliative care and myself. Plan discussed is to go back to [name] with hospice support.   * Severe sepsis ([place]) due to rhinovirus infection Presents with fall.  Found to have worsening confusion.  Also has fever tachycardia. Met SIRS criteria on admission.  With worsening mentation organ damage. Fever improving with scheduled Toradol. Remains tachypneic.  Upper airway wheezing.  With poor secretion clearance. Chest x-ray negative.  CT abdomen negative for any acute abnormality. Blood cultures so far negative. Rhinovirus test is positive.  Influenza and COVID-negative.  Acute metabolic encephalopathy Possibly delirium secondary to sepsis. Currently mentation improving but still not back to baseline. At baseline the patient is able to ambulate, feed himself.  Concern for  serotonin syndrome therefore Lexapro was on hold, ruled out, thus medication resumed on 12/3. Continue Ativan as needed. Metabolic work-up including TSH, ammonia level normal.  VBG did not show any hypercarbia.  LFTs normal.  EKG unremarkable.  CT head negative for any acute intracranial abnormality.  Blood culture and urine culture also negative.  Hospice care patient Palliative care consulted, pt will transition to hospice on discharge at [facility] with [facility].   Cellulitis of right lower leg Treated with IV vancomycin and Fortaz.  Transition to doxycycline.  Completed 5-day treatment therapy. So far cultures are negative.  Cellulitis has resolved.  Moderate dementia with behavioral disturbance ([place]) Along with delirium. On Exelon patch, continue per family request. If he required medication for agitation Zyprexa would be a better choice.  Asthma, chronic, unspecified asthma severity, uncomplicated Continue with respiratory distress.  Continue with nebulizer therapy with Xopenex due to poor compliance with inhaler therapy.  Inadequate oral nutritional intake Patient with ongoing delirium.  Progressive dementia.  Not hungry but when offered food will take a few bites.  Remains at risk for not meeting nutritional demand. Not a good candidate for feeding tube placement secondary to his dementia which is likely the cause of his anorexia as well as dysphagia.  Family also not keen on feeding tube or prolonging his suffering.  If agitation remains an issue though, Zyprexa can be added which might help with agitation and improve appetite  Goals of care, counseling/discussion Multiple discussion with the family.  Palliative care consulted. Goal is to focus on comfort.  Transition to comfort care for now with transition to [name] with [facility] hospice.  Monitor for patient progression at [name] once back in familiar  environment.  Hip pain On admission patient reports  any pain while moving his right leg. X-ray had some concerns regarding hip fracture which was ruled out on CT scan.  Discussed with radiology personally.  HLD (hyperlipidemia) Hold statin  HTN (hypertension) Blood pressure stable.  Continue losartan.  Hypomagnesemia Replaced  Constipation Currently resolved  CAD (coronary artery disease) No evidence of acute ischemia.  Monitor.  Hypophosphatemia Replaced   Myoclonic jerking Associated with confusion. EEG negative for any seizure or epileptogenic foci. Intermittent in nature likely associated with encephalopathy. There was some initial concern for serotonin syndrome although appears that the patient is suffering from rhinovirus infection which might be responsible for delirium. Currently comfort care.  Continue Ativan as needed.      Procedures performed: none   Condition at discharge: stable  Exam General: Appear in mild distress, no Rash; Oral Mucosa clear. no Abnormal Neck Mass Or lumps, Conjunctiva normal  Cardiovascular: S1 and S2 Present, no Murmur Respiratory: increased respiratory effort, Bilateral Air entry present and no Crackles, bilateral expiratory wheezes Abdomen: Bowel Sound present, Soft and no tenderness Extremities: trace Pedal edema Neurology: alert and oriented to time, place, and person affect appropriate. no new focal deficit Gait not checked due to patient safety concerns      Disposition: Hospice care at ALF  Discharge time: greater than 30 minutes.  Follow-up Information     Sueanne Margarita, DO. Schedule an appointment as soon as possible for a visit in 1 week(s).   Specialty: Internal Medicine Contact information: [place] [address] [phone]                 Allergies as of 03/20/2021       Reactions   Aricept [donepezil]    Upset stomach   Depakote [divalproex Sodium]    Made me feel like a different person   Penicillins Itching, Rash   Has patient  had a PCN reaction causing immediate rash, facial/tongue/throat swelling, SOB or lightheadedness with hypotension: YES Has patient had a PCN reaction causing severe rash involving mucus membranes or skin necrosis: NO Has patient had a PCN reaction that required hospitalization: NO Has patient had a PCN reaction occurring within the last 10 years: NO If all of the above answers are "NO", then may proceed with Cephalosporin use.        Medication List     STOP taking these medications    atorvastatin 10 MG tablet Commonly known as: LIPITOR       TAKE these medications    aspirin 81 MG tablet Take 81 mg by mouth daily.   atropine 1 % ophthalmic solution Place 1 drop under the tongue 3 (three) times daily as needed (excessive secretion).   benzonatate 100 MG capsule Commonly known as: TESSALON Take 1 capsule (100 mg total) by mouth 3 (three) times daily as needed for cough.   bisacodyl 10 MG suppository Commonly known as: DULCOLAX Place 1 suppository (10 mg total) rectally daily as needed for moderate constipation.   calcium carbonate 500 MG chewable tablet Commonly known as: TUMS - dosed in mg elemental calcium Chew 1 tablet by mouth daily.   EQ MULTIVITAMINS ADULT GUMMY PO Take 1 tablet by mouth daily.   escitalopram 20 MG tablet Commonly known as: Lexapro Take 1 tablet (20 mg total) by mouth daily.   feeding supplement Liqd Take 237 mLs by mouth 2 (two) times daily between meals.   guaiFENesin-dextromethorphan 100-10 MG/5ML syrup Commonly known as:  ROBITUSSIN DM Take 5 mLs by mouth every 4 (four) hours as needed for cough.   haloperidol 2 MG tablet Commonly known as: HALDOL Take 1 tablet (2 mg total) by mouth every 4 (four) hours as needed for agitation (or delirium).   LORazepam 1 MG tablet Commonly known as: ATIVAN Take 0.5-1 tablets (0.5-1 mg total) by mouth every 4 (four) hours as needed for anxiety.   losartan 50 MG tablet Commonly known as:  COZAAR Take 50 mg by mouth daily.   MELATIN PO Take 3 mg by mouth every evening. Takes usually around at 6pm   morphine CONCENTRATE 10 MG/0.5ML Soln concentrated solution Take 0.25 mLs (5 mg total) by mouth every 2 (two) hours as needed for moderate pain, severe pain or shortness of breath (or dyspnea).   NON FORMULARY Take 5 mLs by mouth 2 (two) times daily. Medication CBD oil BID   polyethylene glycol powder 17 GM/SCOOP powder Commonly known as: GLYCOLAX/MIRALAX Take 17 g by mouth daily.   REFRESH EYE ITCH RELIEF OP Apply to eye.   rivastigmine 4.6 mg/24hr Commonly known as: EXELON Place 1 patch (4.6 mg total) onto the skin daily.   VitaJoy Daily D Gummies 25 MCG (1000 UT) Chew Generic drug: Cholecalciferol Chew 2,000 Units by mouth.   vitamin C 1000 MG tablet Take 500 mg by mouth daily.        CT Abdomen Pelvis Wo Contrast  Result Date: 03/13/2021 CLINICAL DATA:  Increased agitation, leukocytosis, suspected abscess/infection, history prostate cancer, coronary artery disease EXAM: CT ABDOMEN AND PELVIS WITHOUT CONTRAST TECHNIQUE: Multidetector CT imaging of the abdomen and pelvis was performed following the standard protocol without IV contrast. Scattered respiratory motion artifacts. COMPARISON:  01/05/2018 FINDINGS: Lower chest: Subsegmental atelectasis RIGHT lower lobe. Hepatobiliary: Gallbladder and liver normal appearance Pancreas: Normal appearance Spleen: Normal appearance Adrenals/Urinary Tract: Adrenal glands, kidneys, ureters, and bladder normal appearance Stomach/Bowel: Bowel interposition between liver and diaphragm. Increased stool in rectum and distal colon. Stomach decompressed. Remaining bowel loops normal. Appendix surgically absent by history. Vascular/Lymphatic: Atherosclerotic calcifications aorta and coronary arteries. Aorta normal caliber. No adenopathy. Reproductive: Prostate gland and seminal vesicles surgically absent Other: Small umbilical and  infraumbilical ventral hernias containing fat. LEFT inguinal hernia containing fat. No free air or free fluid. Musculoskeletal: Osseous demineralization. IMPRESSION: Increased stool in rectum and distal colon. Small umbilical and infraumbilical ventral hernias containing fat. LEFT inguinal hernia containing fat. No acute intra-abdominal or intrapelvic abnormalities. Aortic Atherosclerosis (ICD10-I70.0). Electronically Signed   By: Lavonia Dana M.D.   On: 03/13/2021 19:45   DG Chest 1 View  Result Date: 03/13/2021 CLINICAL DATA:  Fall. EXAM: CHEST  1 VIEW COMPARISON:  Chest x-ray 08/16/2019. FINDINGS: There is stable moderate elevation of the right hemidiaphragm. There is no lung consolidation, pleural effusion or pneumothorax. Cardiomediastinal silhouette is stable, the heart is enlarged. Prosthetic heart valve again noted. No acute fractures are seen. IMPRESSION: 1. No acute cardiopulmonary process. 2. Stable cardiomegaly and elevation of the right hemidiaphragm. Electronically Signed   By: Ronney Asters M.D.   On: 03/13/2021 16:58   DG Lumbar Spine Complete  Result Date: 03/13/2021 CLINICAL DATA:  Fall EXAM: LUMBAR SPINE - COMPLETE 4+ VIEW COMPARISON:  CT abdomen/pelvis 01/05/2018 FINDINGS: There are 5 non-rib-bearing lumbar type vertebral bodies. Mild compression deformity of the T11 vertebral body is similar in appearance to the prior CT from 2019. Vertebral body heights otherwise appear preserved, without definite evidence of acute fracture. Alignment is normal, with no significant antero or  retrolisthesis. A right-sided pars defect is seen at L5-S1, as seen on prior CT. There is no definite evidence of spondylolysis at the other levels. There is mild multilevel degenerative endplate change and disc space narrowing. There is multilevel facet arthropathy, most advanced at L4-L5 and L5-S1. There is mild gaseous distention of the bowel throughout the abdomen. IMPRESSION: 1. No definite evidence of acute  traumatic injury in the lumbar spine. 2. Multilevel degenerative changes as above. 3. Mild gaseous distention of the bowel throughout the abdomen without evidence of mechanical obstruction, nonspecific. Electronically Signed   By: Valetta Mole M.D.   On: 03/13/2021 16:57   DG Pelvis 1-2 Views  Result Date: 03/13/2021 CLINICAL DATA:  Fall. EXAM: PELVIS - 1-2 VIEW COMPARISON:  None. FINDINGS: There is foreshortening of the right femoral neck. Subcapital fracture can not be excluded. There is no dislocation. Joint spaces are maintained. Soft tissues are within normal limits. Surgical clips overlie the right inguinal region. IMPRESSION: 1. Can not exclude subcapital right femoral neck fracture. Recommend dedicated views of the right hip for further evaluation. Electronically Signed   By: Ronney Asters M.D.   On: 03/13/2021 16:57   DG Wrist Complete Right  Result Date: 03/14/2021 CLINICAL DATA:  Fall EXAM: RIGHT WRIST - COMPLETE 3+ VIEW COMPARISON:  None. FINDINGS: There is no evidence of fracture or dislocation. There is no evidence of arthropathy or other focal bone abnormality. Soft tissues are unremarkable. IMPRESSION: Negative. Electronically Signed   By: Franchot Gallo M.D.   On: 03/14/2021 12:58   CT HEAD WO CONTRAST (5MM)  Result Date: 03/13/2021 CLINICAL DATA:  Status post fall. EXAM: CT HEAD WITHOUT CONTRAST TECHNIQUE: Contiguous axial images were obtained from the base of the skull through the vertex without intravenous contrast. COMPARISON:  April 23, 2019 FINDINGS: Brain: There is mild to moderate severity cerebral atrophy with widening of the extra-axial spaces and ventricular dilatation. There are areas of decreased attenuation within the white matter tracts of the supratentorial brain, consistent with microvascular disease changes. Vascular: Calcification of the right cavernous carotid arteries noted. Skull: Normal. Negative for fracture or focal lesion. Sinuses/Orbits: No acute finding.  Other: None. IMPRESSION: 1. Generalized cerebral atrophy. 2. No acute intracranial abnormality. Electronically Signed   By: Virgina Norfolk M.D.   On: 03/13/2021 22:12   DG CHEST PORT 1 VIEW  Result Date: 03/14/2021 CLINICAL DATA:  Shortness of breath EXAM: PORTABLE CHEST 1 VIEW COMPARISON:  Chest radiograph dated March 13, 2021 FINDINGS: The heart is enlarged. Chronic moderate elevation of the right hemidiaphragm is unchanged. Lungs are otherwise clear. IMPRESSION: No active disease.  Stable chronic findings. Electronically Signed   By: Keane Police D.O.   On: 03/14/2021 10:48   DG Foot 2 Views Right  Result Date: 03/13/2021 CLINICAL DATA:  Cellulitis. EXAM: RIGHT FOOT - 2 VIEW COMPARISON:  None. FINDINGS: There is no evidence of fracture or dislocation. There is no evidence of arthropathy or other focal bone abnormality. Soft tissues are unremarkable. IMPRESSION: Negative. Electronically Signed   By: Ronney Asters M.D.   On: 03/13/2021 21:49   EEG adult  Result Date: 03/14/2021 Lora Havens, MD     03/14/2021  5:50 PM Patient Name: [name] MRN: [id number] Epilepsy Attending: Lora Havens Referring Physician/Provider: Dr Berle Mull Date: 03/14/2021 Duration: 22.24 mins Patient history: 80yo M with ams. EEG to evaluate for seizure Level of alertness:  lethargic AEDs during EEG study: None Technical aspects: This EEG study was done  with scalp electrodes positioned according to the 10-20 International system of electrode placement. Electrical activity was acquired at a sampling rate of $Remov'500Hz'YwuZdq$  and reviewed with a high frequency filter of $RemoveB'70Hz'lyGplkGl$  and a low frequency filter of $RemoveB'1Hz'JdYzXGPb$ . EEG data were recorded continuously and digitally stored. Description: EEG showed continuous generalized 3 to 6 Hz theta-delta slowing, at times with triphasic morphology.  Hyperventilation and photic stimulation were not performed.   ABNORMALITY - Continuous slow, generalized IMPRESSION: This study is  suggestive of moderate diffuse encephalopathy, nonspecific etiology but could be secondary toxic-metabolic etiology, anoxic/hypoxic brain injury. No seizures or epileptiform discharges were seen throughout the recording. Lora Havens   ECHOCARDIOGRAM COMPLETE  Result Date: 03/17/2021    ECHOCARDIOGRAM REPORT   Patient Name:   [name] Date of Exam: 03/15/2021 Medical Rec #:  [id number]          Height:       68.0 in Accession #:    [id number]         Weight:       170.4 lb Date of Birth:  1941-01-07         BSA:          1.909 m Patient Age:    62 years           BP:           152/82 mmHg Patient Gender: M                  HR:           85 bpm. Exam Location:  Inpatient Procedure: 2D Echo Indications:     Fever  History:         Patient has prior history of Echocardiogram examinations, most                  recent 02/27/2018. CAD, Sepsis.; Risk Factors:Hypertension and                  Dyslipidemia.                   Mitral Valve: prosthetic annuloplasty ring valve is present in                  the mitral position.  Sonographer:     Johny Chess RDCS Referring Phys:  [place] Diagnosing Phys: Adrian Prows MD  Sonographer Comments: Image acquisition challenging due to uncooperative patient. IMPRESSIONS  1. LV filling is indeterminate due to MV repair. Left ventricular ejection fraction, by estimation, is 60 to 65%. The left ventricle has normal function. The left ventricle has no regional wall motion abnormalities. Left ventricular diastolic parameters  are indeterminate.  2. Right ventricular systolic function is normal. The right ventricular size is normal.  3. Left atrial size was moderately dilated.  4. Appears grossly normal.  5. The mitral valve has been repaired/replaced. Trivial mitral valve regurgitation. Mild mitral stenosis. There is a prosthetic annuloplasty ring present in the mitral position.  6. The aortic valve was not well visualized. Aortic valve regurgitation is  not visualized. Severe aortic valve stenosis. Aortic valve area, by VTI measures 0.49 cm. Aortic valve mean gradient measures 46.0 mmHg. Aortic valve Vmax measures 4.31 m/s. FINDINGS  Left Ventricle: LV filling is indeterminate due to MV repair. Left ventricular ejection fraction, by estimation, is 60 to 65%. The left ventricle has normal function. The left ventricle has no regional wall motion abnormalities. The left ventricular internal  cavity size was normal in size. There is no left ventricular hypertrophy. Left ventricular diastolic parameters are indeterminate. Right Ventricle: The right ventricular size is normal. No increase in right ventricular wall thickness. Right ventricular systolic function is normal. Left Atrium: Left atrial size was moderately dilated. Right Atrium: Right atrial size was normal in size. Pericardium: Appears grossly normal. The pericardium was not well visualized. Mitral Valve: The mitral valve has been repaired/replaced. Trivial mitral valve regurgitation. There is a prosthetic annuloplasty ring present in the mitral position. Mild mitral valve stenosis. MV peak gradient, 17.1 mmHg. The mean mitral valve gradient  is 6.0 mmHg. Tricuspid Valve: The tricuspid valve is normal in structure. Tricuspid valve regurgitation is not demonstrated. No evidence of tricuspid stenosis. Aortic Valve: The aortic valve was not well visualized. Aortic valve regurgitation is not visualized. Severe aortic stenosis is present. Aortic valve mean gradient measures 46.0 mmHg. Aortic valve peak gradient measures 74.3 mmHg. Aortic valve area, by VTI measures 0.49 cm. Pulmonic Valve: The pulmonic valve was normal in structure. Pulmonic valve regurgitation is not visualized. No evidence of pulmonic stenosis. Aorta: The aortic root is normal in size and structure. IAS/Shunts: No atrial level shunt detected by color flow Doppler.  LEFT VENTRICLE PLAX 2D LVIDd:         4.20 cm   Diastology LVIDs:         3.00 cm    LV e' medial:   7.62 cm/s LV PW:         1.20 cm   LV E/e' medial: 24.0 LV IVS:        1.10 cm LVOT diam:     1.90 cm LV SV:         38 LV SV Index:   20 LVOT Area:     2.84 cm  RIGHT VENTRICLE RV S prime:     10.40 cm/s TAPSE (M-mode): 2.1 cm LEFT ATRIUM             Index        RIGHT ATRIUM           Index LA diam:        4.70 cm 2.46 cm/m   RA Area:     10.60 cm LA Vol (A2C):   63.5 ml 33.26 ml/m  RA Volume:   22.20 ml  11.63 ml/m LA Vol (A4C):   48.1 ml 25.19 ml/m LA Biplane Vol: 55.9 ml 29.28 ml/m  AORTIC VALVE AV Area (Vmax):    0.51 cm AV Area (Vmean):   0.45 cm AV Area (VTI):     0.49 cm AV Vmax:           431.00 cm/s AV Vmean:          321.500 cm/s AV VTI:            0.774 m AV Peak Grad:      74.3 mmHg AV Mean Grad:      46.0 mmHg LVOT Vmax:         77.70 cm/s LVOT Vmean:        51.000 cm/s LVOT VTI:          0.135 m LVOT/AV VTI ratio: 0.17  AORTA Ao Root diam: 2.70 cm MITRAL VALVE                TRICUSPID VALVE MV Area (PHT): 3.21 cm     TR Peak grad:   44.4 mmHg MV Area VTI:   0.88 cm  TR Vmax:        333.00 cm/s MV Peak grad:  17.1 mmHg MV Mean grad:  6.0 mmHg     SHUNTS MV Vmax:       2.07 m/s     Systemic VTI:  0.14 m MV Vmean:      109.0 cm/s   Systemic Diam: 1.90 cm MV Decel Time: 236 msec MV E velocity: 183.00 cm/s MV A velocity: 148.00 cm/s MV E/A ratio:  1.24 Adrian Prows MD Electronically signed by Adrian Prows MD Signature Date/Time: 03/17/2021/2:54:08 PM    Final    DG Hip Unilat W or Wo Pelvis 2-3 Views Right  Result Date: 03/13/2021 CLINICAL DATA:  Fall EXAM: DG HIP (WITH OR WITHOUT PELVIS) 2-3V RIGHT COMPARISON:  None. FINDINGS: SI joints are non widened. Pubic symphysis and rami appear intact. Clips in the right groin. No fracture or malalignment. IMPRESSION: No acute osseous abnormality Electronically Signed   By: Donavan Foil M.D.   On: 03/13/2021 19:05   Results for orders placed or performed during the hospital encounter of 03/13/21  Blood Culture (routine x 2)      Status: None   Collection Time: 03/13/21  5:00 PM   Specimen: BLOOD  Result Value Ref Range Status   Specimen Description   Final    BLOOD RIGHT ANTECUBITAL Performed at [facility] [address], [place], [place] [address]    Special Requests   Final    BOTTLES DRAWN AEROBIC AND ANAEROBIC Blood Culture adequate volume Performed at [facility] [address], [place], [place] [address]    Culture   Final    NO GROWTH 5 DAYS Performed at [facility], [place] [address], [place], [place] [address]    Report Status 03/18/2021 FINAL  Final  Resp Panel by RT-PCR (Flu A&B, Covid)     Status: None   Collection Time: 03/13/21  5:00 PM   Specimen: Nasopharyngeal(NP) swabs in vial transport medium  Result Value Ref Range Status   SARS Coronavirus 2 by RT PCR NEGATIVE NEGATIVE Final    Comment: (NOTE) SARS-CoV-2 target nucleic acids are NOT DETECTED.  The SARS-CoV-2 RNA is generally detectable in upper respiratory specimens during the acute phase of infection. The lowest concentration of SARS-CoV-2 viral copies this assay can detect is 138 copies/mL. A negative result does not preclude SARS-Cov-2 infection and should not be used as the sole basis for treatment or other patient management decisions. A negative result may occur with  improper specimen collection/handling, submission of specimen other than nasopharyngeal swab, presence of viral mutation(s) within the areas targeted by this assay, and inadequate number of viral copies(<138 copies/mL). A negative result must be combined with clinical observations, patient history, and epidemiological information. The expected result is Negative.  Fact Sheet for Patients:  EntrepreneurPulse.com.au  Fact Sheet for Healthcare Providers:  IncredibleEmployment.be  This test is no t yet approved or cleared by the [place] FDA and  has been authorized for  detection and/or diagnosis of SARS-CoV-2 by FDA under an Emergency Use Authorization (EUA). This EUA will remain  in effect (meaning this test can be used) for the duration of the COVID-19 declaration under Section 564(b)(1) of the Act, 21 U.S.C.section 360bbb-3(b)(1), unless the authorization is terminated  or revoked sooner.       Influenza A by PCR NEGATIVE NEGATIVE Final   Influenza B by PCR NEGATIVE NEGATIVE Final    Comment: (NOTE) The Xpert Xpress SARS-CoV-2/FLU/RSV  plus assay is intended as an aid in the diagnosis of influenza from Nasopharyngeal swab specimens and should not be used as a sole basis for treatment. Nasal washings and aspirates are unacceptable for Xpert Xpress SARS-CoV-2/FLU/RSV testing.  Fact Sheet for Patients: EntrepreneurPulse.com.au  Fact Sheet for Healthcare Providers: IncredibleEmployment.be  This test is not yet approved or cleared by the [place] FDA and has been authorized for detection and/or diagnosis of SARS-CoV-2 by FDA under an Emergency Use Authorization (EUA). This EUA will remain in effect (meaning this test can be used) for the duration of the COVID-19 declaration under Section 564(b)(1) of the Act, 21 U.S.C. section 360bbb-3(b)(1), unless the authorization is terminated or revoked.  Performed at [facility], [place] [address] [place], [place] [address]   Blood Culture (routine x 2)     Status: None   Collection Time: 03/13/21  5:15 PM   Specimen: BLOOD  Result Value Ref Range Status   Specimen Description   Final    BLOOD RIGHT ANTECUBITAL Performed at [place] [address], [place], [place] [address]    Special Requests   Final    BOTTLES DRAWN AEROBIC AND ANAEROBIC Blood Culture adequate volume Performed at [facility] [address], [place], [place] [address]    Culture   Final    NO GROWTH 5 DAYS Performed at [facility], [place] [address] [place], [place] [address]    Report Status 03/18/2021 FINAL  Final  Urine Culture     Status: None   Collection Time: 03/13/21  5:30 PM   Specimen: In/Out Cath Urine  Result Value Ref Range Status   Specimen Description   Final    IN/OUT CATH URINE Performed at [place] [address] [place], [place] [address]    Special Requests   Final    NONE Performed at [facility], [place] [address] [place], [place] [address]    Culture   Final    NO GROWTH Performed at [facility], [place] [address] [place], [place] [address]    Report Status 03/14/2021 FINAL  Final  Respiratory (~20 pathogens) panel by PCR     Status: Abnormal   Collection Time: 03/14/21 10:43 AM   Specimen: Nasopharyngeal Swab; Respiratory  Result Value Ref Range Status   Adenovirus NOT DETECTED NOT DETECTED Final   Coronavirus 229E NOT DETECTED NOT DETECTED Final    Comment: (NOTE) The Coronavirus on the Respiratory Panel, DOES NOT test for the novel  Coronavirus (2019 nCoV)    Coronavirus HKU1 NOT DETECTED NOT DETECTED Final   Coronavirus NL63 NOT DETECTED NOT DETECTED Final   Coronavirus OC43 NOT DETECTED NOT DETECTED Final   Metapneumovirus NOT DETECTED NOT DETECTED Final   Rhinovirus / Enterovirus DETECTED (A) NOT DETECTED Final   Influenza A NOT DETECTED NOT DETECTED Final   Influenza B NOT DETECTED NOT DETECTED Final   Parainfluenza Virus 1 NOT DETECTED NOT DETECTED Final   Parainfluenza Virus 2 NOT DETECTED NOT DETECTED Final   Parainfluenza Virus 3 NOT DETECTED NOT DETECTED Final   Parainfluenza Virus 4 NOT DETECTED NOT DETECTED Final   Respiratory Syncytial Virus NOT DETECTED NOT DETECTED Final   Bordetella pertussis NOT DETECTED NOT DETECTED Final   Bordetella Parapertussis NOT DETECTED NOT DETECTED Final   Chlamydophila pneumoniae NOT DETECTED NOT DETECTED Final   Mycoplasma pneumoniae NOT DETECTED NOT DETECTED Final     Comment: Performed  at [facility], [place] [address] [place], [place] [address]    Signed:  Berle Mull MD.  [facility] 03/20/2021, 8:30 AM

## 2021-03-21 DIAGNOSIS — E639 Nutritional deficiency, unspecified: Secondary | ICD-10-CM

## 2021-03-21 DIAGNOSIS — J45909 Unspecified asthma, uncomplicated: Secondary | ICD-10-CM

## 2021-03-21 NOTE — Progress Notes (Signed)
Daily Progress Note   Patient Name: [name]       Date: 03/21/2021 DOB: May 08, 1940  Age: 80 y.o. MRN#: [id number] Attending Physician: Alma Friendly, MD Primary Care Physician: Sueanne Margarita, DO Admit Date: 03/13/2021  Reason for Consultation/Follow-up: Establishing goals of care  Subjective:  Patient noted to be not alert, is restless in bed, wife and daughter at bedside.     Length of Stay: 8  Current Medications: Scheduled Meds:  . cefdinir  300 mg Oral Q12H  . chlorhexidine  15 mL Mouth Rinse BID  . escitalopram  20 mg Oral Daily  . feeding supplement  237 mL Oral BID BM  . ipratropium  0.5 mg Nebulization Q6H  . levalbuterol  0.63 mg Nebulization Q6H  . losartan  50 mg Oral Daily  . mouth rinse  15 mL Mouth Rinse q12n4p  . metroNIDAZOLE  500 mg Oral Q12H  . OLANZapine  2.5 mg Oral QHS  . rivastigmine  4.6 mg Transdermal Daily    Continuous Infusions:   PRN Meds: acetaminophen **OR** acetaminophen, benzonatate, bisacodyl, diphenhydrAMINE, food thickener, glycopyrrolate **OR** glycopyrrolate **OR** glycopyrrolate, guaiFENesin-dextromethorphan, haloperidol **OR** haloperidol **OR** haloperidol lactate, levalbuterol, LORazepam **OR** LORazepam **OR** LORazepam, LORazepam, morphine injection, morphine CONCENTRATE **OR** morphine CONCENTRATE, naphazoline-glycerin, ondansetron **OR** ondansetron (ZOFRAN) IV  Physical Exam         General: not alert, has wheezing. Confused. HEENT: No bruits, no goiter, no JVD Lungs: wheezing.  Abdomen:  nondistended Skin: Warm and dry  Vital Signs: BP (!) 168/139 (BP Location: Other (Comment)) Comment (BP Location): L leg  Pulse 84   Temp 97.9 F (36.6 C) (Axillary)   Resp 20   Ht 5\' 8"  (1.727 m)   Wt 80.1 kg   SpO2  93%   BMI 26.85 kg/m  SpO2: SpO2: 93 % O2 Device: O2 Device: Room Air O2 Flow Rate: O2 Flow Rate (L/min): 2 L/min  Intake/output summary: No intake or output data in the 24 hours ending 03/21/21 1254 LBM: Last BM Date: 03/18/21 Baseline Weight: Weight: 77 kg Most recent weight: Weight: 80.1 kg       Palliative Assessment/Data:      Patient Active Problem List   Diagnosis Date Noted  . Hospice care patient 03/20/2021  . Inadequate oral nutritional intake 03/18/2021  . Myoclonic jerking 03/17/2021  .  Cellulitis of right lower leg 03/14/2021  . Constipation 03/14/2021  . Hypomagnesemia 03/14/2021  . Hypophosphatemia 03/14/2021  . Acute metabolic encephalopathy 57/26/2035  . Hip pain 03/14/2021  . Goals of care, counseling/discussion 03/14/2021  . Severe sepsis ([place]) due to rhinovirus infection 03/13/2021  . Discoloration of nail 06/21/2019  . Hammer toe, acquired 10/27/2018  . Syncope 03/28/2018  . Genetic testing 10/30/2017  . Family history of ovarian cancer   . History of prostate cancer   . History of melanoma   . Moderate dementia with behavioral disturbance ([place]) 03/17/2017  . Hoarseness of voice 02/26/2017  . 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  . 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 Sleep behavior disorder, REM 06/11/2016  . TIA due to embolism ([place]) 06/11/2016  . History of heart artery stent 06/11/2016  . Subungual malignant melanoma ([place]) 06/19/2015  . Nonrheumatic aortic valve insufficiency 06/15/2015  . Malignant melanoma of finger ([place]) 06/02/2015  . Gastroesophageal reflux disease 01/10/2015  . Mild persistent asthma 01/10/2015  . HLD (hyperlipidemia) 01/10/2015  . ED (erectile dysfunction) 11/13/2014  . Lower urinary tract symptoms (LUTS) 11/13/2014  . Cancer of prostate with low recurrence risk (stage T1-2a and Gleason < 7 and PSA < 10) ([place]) 11/03/2014  . Elevated prostate specific antigen (PSA) 09/30/2014  . Asthma,  moderate persistent, well-controlled 08/08/2014  . Dyspnea 01/27/2014  . Asthma, chronic, unspecified asthma severity, uncomplicated 59/74/1638  . Hypotension, unspecified 07/15/2013  . Hypotension 07/15/2013  . Dyspnea and respiratory abnormality 07/13/2013  . Dysphonia 05/08/2011  . S/P MVR (mitral valve repair) 01/14/2011  . HTN (hypertension)   . Osteoporosis   . CAD (coronary artery disease)   . Mitral valve prolapse with severe mitral regurgitation   . Diaphragm paralysis 02/09/2010  . Hypertension, essential 02/05/2010  . OSTEOPOROSIS 02/05/2010  . CARDIAC MURMUR, SYSTOLIC 45/36/4680  . COUGH 02/05/2010    Palliative Care Assessment & Plan   Patient Profile: [name] is an 80 year old male with past medical history of asthma, CAD, prostate cancer, GERD, hypertension, TIA, and memory impairment who is a long-term resident at [name].  He is currently admitted with sepsis related to rhinovirus infection, cellulitis to lower extremity, and encephalopathy.  His intake has remained poor and his mental status waxes and wanes.  Recommendations/Plan: DNR/DNI Goals of care discussions with wife and daughter at bedside, we re discussed broad goals of care , differences between residential hospice versus home with hospice were explored in detail.  Home with hospice once arrangements are completed.     Goals of Care and Additional Recommendations: Limitations on Scope of Treatment: Full Comfort Care  Code Status:    Code Status Orders  (From admission, onward)           Start     Ordered   03/17/21 1212  Do not attempt resuscitation (DNR)  Continuous       Question Answer Comment  In the event of cardiac or respiratory ARREST Do not call a "code blue"   In the event of cardiac or respiratory ARREST Do not perform Intubation, CPR, defibrillation or ACLS   In the event of cardiac or respiratory ARREST Use medication by any route, position, wound care, and other  measures to relive pain and suffering. May use oxygen, suction and manual treatment of airway obstruction as needed for comfort.      03/17/21 1214           Code Status History  Date Active Date Inactive Code Status Order ID Comments User Context   03/14/2021 0135 03/17/2021 1214 DNR [id number]  Toy Baker, MD Inpatient   03/13/2021 2119 03/14/2021 0135 DNR [id number]  Toy Baker, MD ED   03/28/2018 1349 03/29/2018 1500 Full Code [id number]  Caren Griffins, MD ED   07/15/2013 1629 07/18/2013 1809 Full Code [id number]  [name], MD Inpatient      Advance Directive Documentation    Flowsheet Row Most Recent Value  Type of Advance Directive Out of facility DNR (pink MOST or yellow form)  Pre-existing out of facility DNR order (yellow form or pink MOST form) Yellow form placed in chart (order not valid for inpatient use)  "MOST" Form in Place? --       Prognosis:  Likely markedly limited, days to weeks.   Discharge Planning: Home with hospice.   Care plan was discussed with family including patient's wife and daughters.   Thank you for allowing the Palliative Medicine Team to assist in the care of this patient.   Total Time 35 Prolonged Time Billed No   Greater than 50%  of this time was spent counseling and coordinating care related to the above assessment and plan.  Loistine Chance, MD  Please contact Palliative Medicine Team phone at [phone] for questions and concerns from 7 AM to 7 PM. After 7 PM, please call primary service.

## 2021-03-21 NOTE — Progress Notes (Signed)
[facility] Progress Note  Patient: [name]    GXQ:[id number]  DOA: 03/13/2021    Date of Service: the patient was seen and examined on 03/21/2021  Brief hospital course: 11/29, admitted with worsening mentation, fever and sepsis. 11/30 RVP positive for rhinovirus. 12/1 mentation improving, not able to follow commands 12/3, mentation waxing and waning.  Palliative care consulted.  Transition to complete comfort   Assessment and Plan:  Severe sepsis ([place]) due to rhinovirus infection Met SIRS criteria on admission.  With worsening mentation organ damage Remains tachypneic.  Upper airway wheezing.  With poor secretion clearance. Chest x-ray negative Blood cultures so far negative Rhinovirus test is positive.  Influenza and COVID-negative.   Acute metabolic encephalopathy Moderate dementia with behavioral disturbance Possibly delirium secondary to sepsis Vs ongoing progressive dementia Currently mentation not improving, not back to baseline. At baseline the patient is able to ambulate, feed himself Metabolic work-up including TSH, ammonia level normal.  VBG did not show any hypercarbia.  LFTs normal.  EKG unremarkable.  CT head negative for any acute intracranial abnormality.  Blood culture and urine culture also negative Zyprexa for agitation   Cellulitis of right lower leg Treated with IV vancomycin and Fortaz.  Transition to doxycycline.  Completed 5-day treatment therapy. So far cultures are negative.  Cellulitis has resolved   Asthma, chronic, unspecified asthma severity, uncomplicated Continue with respiratory distress.  Continue with nebulizer therapy with Xopenex due to poor compliance with inhaler therapy.   Poor oral nutritional intake Patient with ongoing delirium.  Progressive dementia. Remains at risk for not meeting nutritional demand. Not a good candidate for feeding tube placement secondary to his dementia which is likely the cause of his anorexia as  well as dysphagia.  Family also not keen on feeding tube or prolonging his suffering   Goals of care, counseling/discussion Multiple discussion with the family.  Palliative care consulted. Goal is to focus on comfort.  Transition to comfort care for now with transition to home or residential hospice   R Hip pain On admission patient reports any pain while moving his right leg. X-ray had some concerns regarding hip fracture which was ruled out on CT scan.  Discussed with radiology personally   HTN Blood pressure stable.  Continue losartan   Myoclonic jerking Associated with confusion. EEG negative for any seizure or epileptogenic foci. Intermittent in nature likely associated with encephalopathy. There was some initial concern for serotonin syndrome although appears that the patient is suffering from rhinovirus infection which might be responsible for delirium. Currently comfort care     Body mass index is 26.85 kg/m.  Nutrition Problem: Inadequate oral intake Etiology: decreased appetite, inability to eat     Subjective:  Remains confused, with significant delirium, intermittently agitated.  Unable to have any meaningful conversation.  Not alert or oriented.  Discussed extensively with daughter and wife at bedside, agreeable to home versus residential hospice   Exam: General: Confused, delirious, intermittently agitated, not alert or oriented Cardiovascular: S1, S2 present Respiratory: Noted some bilateral wheezing, coarse breath sounds Abdomen: Soft, nontender, nondistended, bowel sounds present Musculoskeletal: No bilateral pedal edema noted Skin: Normal, noted groin rash Psychiatry: Unable to assess   Disposition:  Status is: Inpatient  Remains inpatient appropriate because: Unsafe discharge.  Discharge to home versus residential hospice  Family Communication: Daughter and wife at bedside, discussed extensively  DVT Prophylaxis: SCDs Start: 03/14/21  0134     [facility]  Alma Friendly  03/21/2021 6:08  PM  To reach On-call, see care teams to locate the attending and reach out via [email or web address]. Between 7PM-7AM, please contact night-coverage If you still have difficulty reaching the attending provider, please page the [facility] (Director on Call) for [facility] on amion for assistance.

## 2021-03-21 NOTE — Progress Notes (Signed)
PT Cancellation Note  Patient Details Name: [name] MRN: [id number] DOB: 1940-08-06   Cancelled Treatment:    Reason Eval/Treat Not Completed: PT screened, no needs identified, will sign off, Noted many discussions related to [place] and disposition. Patient not a candidate for skilled PT  for mobility at this time.   Claretha Cooper 03/21/2021, 2:36 PM [place] Pager [phone] Office [phone]

## 2021-03-21 NOTE — NC FL2 (Signed)
[place] LEVEL OF CARE SCREENING TOOL     IDENTIFICATION  Patient Name: [name] Birthdate: 1940-06-06 Sex: male Admission Date (Current Location): 03/13/2021  [facility] and [place] Number:  Herbalist and Address:  [facility],  [place], [place]      Provider Number: [id number]  Attending Physician Name and Address:  Alma Friendly, MD  Relative Name and Phone Number:  [place], [place] [phone]  [place] Daughter [phone]  [phone]    Current Level of Care: Hospital Recommended Level of Care: [place] Prior Approval Number:    Date Approved/Denied:   PASRR Number: [id number]  Discharge Plan: SNF    Current Diagnoses: Patient Active Problem List   Diagnosis Date Noted   Hospice care patient 03/20/2021   Inadequate oral nutritional intake 03/18/2021   Myoclonic jerking 03/17/2021   Cellulitis of right lower leg 03/14/2021   Constipation 03/14/2021   Hypomagnesemia 03/14/2021   Hypophosphatemia 70/78/6754   Acute metabolic encephalopathy 49/20/1007   Hip pain 03/14/2021   Goals of care, counseling/discussion 03/14/2021   Severe sepsis ([place]) due to rhinovirus infection 03/13/2021   Discoloration of nail 06/21/2019   Hammer toe, acquired 10/27/2018   Syncope 03/28/2018   Genetic testing 10/30/2017   Family history of ovarian cancer    History of prostate cancer    History of melanoma    Moderate dementia with behavioral disturbance ([place]) 03/17/2017   Hoarseness of voice 02/26/2017   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   Amnestic MCI (mild cognitive impairment with memory loss) 06/11/2016   Sleep behavior disorder, REM 06/11/2016   TIA due to embolism ([place]) 06/11/2016   History of heart artery stent 06/11/2016   Subungual malignant melanoma ([place]) 06/19/2015   Nonrheumatic aortic valve insufficiency 06/15/2015   Malignant melanoma of finger ([name])  06/02/2015   Gastroesophageal reflux disease 01/10/2015   Mild persistent asthma 01/10/2015   HLD (hyperlipidemia) 01/10/2015   ED (erectile dysfunction) 11/13/2014   Lower urinary tract symptoms (LUTS) 11/13/2014   Cancer of prostate with low recurrence risk (stage T1-2a and Gleason < 7 and PSA < 10) ([place]) 11/03/2014   Elevated prostate specific antigen (PSA) 09/30/2014   Asthma, moderate persistent, well-controlled 08/08/2014   Dyspnea 01/27/2014   Asthma, chronic, unspecified asthma severity, uncomplicated 04/03/7587   Hypotension, unspecified 07/15/2013   Hypotension 07/15/2013   Dyspnea and respiratory abnormality 07/13/2013   Dysphonia 05/08/2011   S/P MVR (mitral valve repair) 01/14/2011   HTN (hypertension)    Osteoporosis    CAD (coronary artery disease)    Mitral valve prolapse with severe mitral regurgitation    Diaphragm paralysis 02/09/2010   Hypertension, essential 02/05/2010   OSTEOPOROSIS 02/05/2010   CARDIAC MURMUR, SYSTOLIC 32/54/9826   COUGH 02/05/2010    Orientation RESPIRATION BLADDER Height & Weight     Self  Normal Incontinent Weight: 80.1 kg Height:  5\' 8"  (172.7 cm)  BEHAVIORAL SYMPTOMS/MOOD NEUROLOGICAL BOWEL NUTRITION STATUS      Incontinent Diet (regular)  AMBULATORY STATUS COMMUNICATION OF NEEDS Skin   Extensive Assist Verbally Normal                       Personal Care Assistance Level of Assistance  Bathing, Feeding, Dressing Bathing Assistance: Maximum assistance Feeding assistance: Maximum assistance Dressing Assistance: Maximum assistance     Functional Limitations Info  Sight, Hearing, Speech Sight Info: Adequate Hearing Info: Adequate Speech Info: Impaired    SPECIAL CARE  FACTORS FREQUENCY  PT (By licensed PT), OT (By licensed OT)     PT Frequency: 5 x weekly OT Frequency: 5 x weekly            Contractures Contractures Info: Not present    Additional Factors Info  Code Status, Allergies, Psychotropic Code  Status Info: DNR Allergies Info: aricept depakote pencillins Psychotropic Info: lexapro         Current Medications (03/21/2021):  This is the current hospital active medication list Current Facility-Administered Medications  Medication Dose Route Frequency Provider Last Rate Last Admin   acetaminophen (TYLENOL) tablet 650 mg  650 mg Oral Q6H PRN Lavina Hamman, MD       Or   acetaminophen (TYLENOL) suppository 650 mg  650 mg Rectal Q6H PRN Lavina Hamman, MD       benzonatate (TESSALON) capsule 100 mg  100 mg Oral TID PRN Lavina Hamman, MD       bisacodyl (DULCOLAX) suppository 10 mg  10 mg Rectal Daily PRN Lavina Hamman, MD       cefdinir (OMNICEF) capsule 300 mg  300 mg Oral Q12H Lavina Hamman, MD   300 mg at 03/20/21 2213   chlorhexidine (PERIDEX) 0.12 % solution 15 mL  15 mL Mouth Rinse BID Lavina Hamman, MD   15 mL at 03/20/21 2215   diphenhydrAMINE (BENADRYL) injection 12.5 mg  12.5 mg Intravenous Q4H PRN Lavina Hamman, MD       escitalopram (LEXAPRO) tablet 20 mg  20 mg Oral Daily Lavina Hamman, MD   20 mg at 03/20/21 0849   feeding supplement (ENSURE ENLIVE / ENSURE PLUS) liquid 237 mL  237 mL Oral BID BM Lavina Hamman, MD   237 mL at 03/20/21 1630   food thickener (SIMPLYTHICK (NECTAR/LEVEL 2/MILDLY THICK)) 1 packet  1 packet Oral PRN Lavina Hamman, MD       glycopyrrolate (ROBINUL) tablet 1 mg  1 mg Oral Q4H PRN Lavina Hamman, MD       Or   glycopyrrolate (ROBINUL) injection 0.2 mg  0.2 mg Subcutaneous Q4H PRN Lavina Hamman, MD       Or   glycopyrrolate (ROBINUL) injection 0.2 mg  0.2 mg Intravenous Q4H PRN Lavina Hamman, MD       guaiFENesin-dextromethorphan (ROBITUSSIN DM) 100-10 MG/5ML syrup 5 mL  5 mL Oral Q4H PRN Lavina Hamman, MD   5 mL at 03/19/21 2238   haloperidol (HALDOL) tablet 2 mg  2 mg Oral Q4H PRN Lavina Hamman, MD       Or   haloperidol (HALDOL) 2 MG/ML solution 2 mg  2 mg Sublingual Q4H PRN Lavina Hamman, MD       Or    haloperidol lactate (HALDOL) injection 2 mg  2 mg Intravenous Q4H PRN Lavina Hamman, MD       ipratropium (ATROVENT) nebulizer solution 0.5 mg  0.5 mg Nebulization Q6H Lavina Hamman, MD   0.5 mg at 03/21/21 0911   levalbuterol (XOPENEX) nebulizer solution 0.63 mg  0.63 mg Nebulization Q3H PRN Lavina Hamman, MD   0.63 mg at 03/20/21 1610   levalbuterol (XOPENEX) nebulizer solution 0.63 mg  0.63 mg Nebulization Q6H Lavina Hamman, MD   0.63 mg at 03/21/21 0910   LORazepam (ATIVAN) tablet 1 mg  1 mg Oral Q4H PRN Lavina Hamman, MD   1 mg at 03/20/21 9604   Or  LORazepam (ATIVAN) 2 MG/ML concentrated solution 1 mg  1 mg Sublingual Q4H PRN Lavina Hamman, MD       Or   LORazepam (ATIVAN) injection 1 mg  1 mg Intravenous Q4H PRN Lavina Hamman, MD   1 mg at 03/18/21 0030   LORazepam (ATIVAN) injection 1 mg  1 mg Intravenous Q4H PRN Lavina Hamman, MD   1 mg at 03/17/21 1842   losartan (COZAAR) tablet 50 mg  50 mg Oral Daily Lavina Hamman, MD   50 mg at 03/20/21 0849   MEDLINE mouth rinse  15 mL Mouth Rinse q12n4p Lavina Hamman, MD   15 mL at 03/20/21 1743   metroNIDAZOLE (FLAGYL) tablet 500 mg  500 mg Oral Q12H Lavina Hamman, MD   500 mg at 03/20/21 2213   morphine 2 MG/ML injection 2-4 mg  2-4 mg Intravenous Q2H PRN Lavina Hamman, MD       morphine CONCENTRATE 10 MG/0.5ML oral solution 5 mg  5 mg Oral Q2H PRN Lavina Hamman, MD       Or   morphine CONCENTRATE 10 MG/0.5ML oral solution 5 mg  5 mg Sublingual Q2H PRN Lavina Hamman, MD       naphazoline-glycerin (CLEAR EYES REDNESS) ophth solution 1-2 drop  1-2 drop Both Eyes QID PRN Lavina Hamman, MD   1 drop at 03/14/21 1845   OLANZapine (ZYPREXA) tablet 2.5 mg  2.5 mg Oral QHS Lavina Hamman, MD   2.5 mg at 03/20/21 2213   ondansetron (ZOFRAN-ODT) disintegrating tablet 4 mg  4 mg Oral Q6H PRN Lavina Hamman, MD       Or   ondansetron [facility]) injection 4 mg  4 mg Intravenous Q6H PRN Lavina Hamman, MD       rivastigmine  (EXELON) 4.6 mg/24hr 4.6 mg  4.6 mg Transdermal Daily Lavina Hamman, MD   4.6 mg at 03/20/21 2376     Discharge Medications: Please see discharge summary for a list of discharge medications.  Relevant Imaging Results:  Relevant Lab Results:   Additional Information SSN [id number]  Leeroy Cha, RN

## 2021-03-21 NOTE — Progress Notes (Signed)
Manufacturing engineer [facility]) Hospital Liaison note.    Spoke with pt's daughter by phone to review options for care after discharge.  Family shares interest in [facility]. Shared process for admission to [facility] including need to have [facility] MD assess for eligibility. Discussed comfort care approach at [facility]; family verbalized understanding but not clarity about goals of care.  Pt's daughter and pt's spouse would like to tour the facility.  Plan made for family to tour between 1:30p-4:30p today. [place] liaison will follow up with family afterwards to discuss next steps.  Thank you for the opportunity to participate  in this patient's care.  Domenic Moras, BSN, RN [facility] Liaison (listed on [place] under Hospice[facility])    [phone] (24h on call)

## 2021-03-21 NOTE — Progress Notes (Signed)
Nutrition Brief Note  Chart reviewed. Patient assessed by this RD on 12/1. Patient now transitioning to comfort care and plan, per Palliative Care's note today, is for home with hospice once arrangements are made.  No further nutrition interventions planned at this time. Please re-consult as needed.      Jarome Matin, MS, RD, LDN, CNSC Inpatient Clinical Dietitian RD pager # available in [place]  After hours/weekend pager # available in [facility]

## 2021-03-21 NOTE — TOC Progression Note (Addendum)
Transition of Care [facility]) - Progression Note    Patient Details  Name: [name] MRN: [id number] Date of Birth: July 09, 1940  Transition of Care [facility]) CM/SW Contact  Leeroy Cha, RN Phone Number: 03/21/2021, 9:10 AM  Clinical Narrative:    Spoke with the wife and daughterd/not ready for patient to go home with palliative care or to [facility].  Wants patient to go to [facility].  Will start auth and call Claiborne Billings at [facility]. Tct-kelly Veverly Fells will look at [facility] and call back. Family has decided to take patient home with hospice care.  Tct-krislyn king message left on voice mail. Expected Discharge Plan: Assisted Living Barriers to Discharge: Continued Medical Work up  Expected Discharge Plan and Services Expected Discharge Plan: Assisted Living   Discharge Planning Services: CM Consult   Living arrangements for the past 2 months: Assisted Living Facility                                       Social Determinants of Health (SDOH) Interventions    Readmission Risk Interventions No flowsheet data found.

## 2021-03-22 NOTE — Progress Notes (Signed)
[facility] ([facility])  Once [facility] picks the patient up can someone please call Reena to let her know. Thank you so much  Clementeen Hoof, BSN, RN

## 2021-03-22 NOTE — Progress Notes (Signed)
Manufacturing engineer [facility]) Hospital Liaison note.     Spoke with spouse and daughter at bedside. Family would now like to take the patient home to provide care for end of life. This liaison has ordered a new bed with railing to the home. Address verified by spouse. Dr [name] is contact and is aware of updates. Daughter is going to get the [facility] from [name] and transport it to the home. Plan is to try to get DME to the home today so we can discharge this patient. This liaison will keep the team posted.   If MD can send signed DNR form home with the patient as well as any comfort medications.    Thank you for the opportunity to participate  in this patient's care.   Clementeen Hoof, BSN, RN [facility] Liaison (listed on [facility] under Hospice/[facility])    [phone] (24h on call)

## 2021-03-22 NOTE — Progress Notes (Signed)
Daily Progress Note   Patient Name: [name]       Date: 03/22/2021 DOB: [birth year]  Age: 80 y.o. MRN#: [id number] Attending Physician: Alma Friendly, MD Primary Care Physician: Sueanne Margarita, DO Admit Date: 03/13/2021  Reason for Consultation/Follow-up: Establishing goals of care  Subjective:  Patient noted to be not alert, is restless in bed, wife at bedside.   Spiritual music is playing on the wife's Ipad, the patient appears calm, he is not awake or alert, not restless.     Length of Stay: 9  Current Medications: Scheduled Meds:  . cefdinir  300 mg Oral Q12H  . chlorhexidine  15 mL Mouth Rinse BID  . escitalopram  20 mg Oral Daily  . feeding supplement  237 mL Oral BID BM  . ipratropium  0.5 mg Nebulization Q6H  . levalbuterol  0.63 mg Nebulization Q6H  . losartan  50 mg Oral Daily  . mouth rinse  15 mL Mouth Rinse q12n4p  . metroNIDAZOLE  500 mg Oral Q12H  . OLANZapine  2.5 mg Oral QHS  . rivastigmine  4.6 mg Transdermal Daily    Continuous Infusions:   PRN Meds: acetaminophen **OR** acetaminophen, benzonatate, bisacodyl, diphenhydrAMINE, food thickener, glycopyrrolate **OR** glycopyrrolate **OR** glycopyrrolate, guaiFENesin-dextromethorphan, haloperidol **OR** haloperidol **OR** haloperidol lactate, levalbuterol, LORazepam **OR** LORazepam **OR** LORazepam, LORazepam, morphine injection, morphine CONCENTRATE **OR** morphine CONCENTRATE, naphazoline-glycerin, ondansetron **OR** ondansetron (ZOFRAN) IV  Physical Exam         General: not alert,  calm HEENT: No bruits, no goiter, no JVD Lungs: diminished breath sounds Abdomen:  nondistended Skin: Warm and dry  Vital Signs: BP (!) 168/139 (BP Location: Other (Comment)) Comment (BP Location): L leg   Pulse 84   Temp 97.9 F (36.6 C) (Axillary)   Resp 20   Ht 5\' 8"  (1.727 m)   Wt 80.1 kg   SpO2 98%   BMI 26.85 kg/m  SpO2: SpO2: 98 % O2 Device: O2 Device: Room Air O2 Flow Rate: O2 Flow Rate (L/min): 2 L/min  Intake/output summary: No intake or output data in the 24 hours ending 03/22/21 0953 LBM: Last BM Date: 03/18/21 Baseline Weight: Weight: 77 kg Most recent weight: Weight: 80.1 kg       Palliative Assessment/Data:      Patient Active Problem List   Diagnosis  Date Noted  . Hospice care patient 03/20/2021  . Inadequate oral nutritional intake 03/18/2021  . Myoclonic jerking 03/17/2021  . Cellulitis of right lower leg 03/14/2021  . Constipation 03/14/2021  . Hypomagnesemia 03/14/2021  . Hypophosphatemia 03/14/2021  . Acute metabolic encephalopathy 46/56/8127  . Hip pain 03/14/2021  . Goals of care, counseling/discussion 03/14/2021  . Severe sepsis ([place]) due to rhinovirus infection 03/13/2021  . Discoloration of nail 06/21/2019  . Hammer toe, acquired 10/27/2018  . Syncope 03/28/2018  . Genetic testing 10/30/2017  . Family history of ovarian cancer   . History of prostate cancer   . History of melanoma   . Moderate dementia with behavioral disturbance ([place]) 03/17/2017  . Hoarseness of voice 02/26/2017  . 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  . 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 Sleep behavior disorder, REM 06/11/2016  . TIA due to embolism ([place]) 06/11/2016  . History of heart artery stent 06/11/2016  . Subungual malignant melanoma ([place]) 06/19/2015  . Nonrheumatic aortic valve insufficiency 06/15/2015  . Malignant melanoma of finger ([place]) 06/02/2015  . Gastroesophageal reflux disease 01/10/2015  . Mild persistent asthma 01/10/2015  . HLD (hyperlipidemia) 01/10/2015  . ED (erectile dysfunction) 11/13/2014  . Lower urinary tract symptoms (LUTS) 11/13/2014  . Cancer of prostate with low recurrence risk (stage T1-2a and Gleason < 7 and  PSA < 10) ([place]) 11/03/2014  . Elevated prostate specific antigen (PSA) 09/30/2014  . Asthma, moderate persistent, well-controlled 08/08/2014  . Dyspnea 01/27/2014  . Asthma, chronic, unspecified asthma severity, uncomplicated 51/70/0174  . Hypotension, unspecified 07/15/2013  . Hypotension 07/15/2013  . Dyspnea and respiratory abnormality 07/13/2013  . Dysphonia 05/08/2011  . S/P MVR (mitral valve repair) 01/14/2011  . HTN (hypertension)   . Osteoporosis   . CAD (coronary artery disease)   . Mitral valve prolapse with severe mitral regurgitation   . Diaphragm paralysis 02/09/2010  . Hypertension, essential 02/05/2010  . OSTEOPOROSIS 02/05/2010  . CARDIAC MURMUR, SYSTOLIC 94/49/6759  . COUGH 02/05/2010    Palliative Care Assessment & Plan   Patient Profile: Mr. Kabler is an 80 year old male with past medical history of asthma, CAD, prostate cancer, GERD, hypertension, TIA, and memory impairment who is a long-term resident at [name].  He is currently admitted with sepsis related to rhinovirus infection, cellulitis to lower extremity, and encephalopathy.  His intake has remained poor and his mental status waxes and wanes.  Recommendations/Plan: DNR/DNI Goals of care discussions with wife and daughter at bedside, we re discussed broad goals of care , differences between residential hospice versus home with hospice were explored in detail.  Home with hospice once arrangements are completed.     Goals of Care and Additional Recommendations: Limitations on Scope of Treatment: Full Comfort Care  Code Status:    Code Status Orders  (From admission, onward)           Start     Ordered   03/17/21 1212  Do not attempt resuscitation (DNR)  Continuous       Question Answer Comment  In the event of cardiac or respiratory ARREST Do not call a "code blue"   In the event of cardiac or respiratory ARREST Do not perform Intubation, CPR, defibrillation or ACLS   In the event of  cardiac or respiratory ARREST Use medication by any route, position, wound care, and other measures to relive pain and suffering. May use oxygen, suction and manual treatment of airway obstruction as needed for comfort.  03/17/21 1214           Code Status History     Date Active Date Inactive Code Status Order ID Comments User Context   03/14/2021 0135 03/17/2021 1214 DNR [id number]  Toy Baker, MD Inpatient   03/13/2021 2119 03/14/2021 0135 DNR [id number]  Toy Baker, MD ED   03/28/2018 1349 03/29/2018 1500 Full Code [id number]  Caren Griffins, MD ED   07/15/2013 1629 07/18/2013 1809 Full Code [id number]  [name], MD Inpatient      Advance Directive Documentation    Flowsheet Row Most Recent Value  Type of Advance Directive Out of facility DNR (pink MOST or yellow form)  Pre-existing out of facility DNR order (yellow form or pink MOST form) Yellow form placed in chart (order not valid for inpatient use)  "MOST" Form in Place? --       Prognosis:  Likely markedly limited, days to weeks.   Discharge Planning: Home with hospice.   Care plan was discussed with family including patient's wife and IDT   Thank you for allowing the Palliative Medicine Team to assist in the care of this patient.   Total Time 25 Prolonged Time Billed No   Greater than 50%  of this time was spent counseling and coordinating care related to the above assessment and plan.  Loistine Chance, MD  Please contact Palliative Medicine Team phone at [phone] for questions and concerns from 7 AM to 7 PM. After 7 PM, please call primary service.

## 2021-03-22 NOTE — Progress Notes (Signed)
[facility] transported pt from 4W to home.

## 2021-03-22 NOTE — Progress Notes (Signed)
Pt wife has decided to take pt home with hospice. Please relay to [place]. Also hospital bed and other medical equipment was arranged to be delivered to [name], that needs to be changed to pt's home per wife. Will relay info to AM nurse and attempt to contact CM.

## 2021-03-22 NOTE — TOC Transition Note (Addendum)
Transition of Care [facility]) - CM/SW Discharge Note   Patient Details  Name: [name] MRN: [id number] Date of Birth: 1941-01-20  [facility] Care [facility]) CM/SW Contact:  Leeroy Cha, RN Phone Number: 03/22/2021, 12:41 PM   Clinical Narrative:    Patient transitioned to hospice care through [facility] at home.  To be transported by [facility].  Med necessity form and required information given to floor rn. Tct-[facility] pick up and transport arranged for 1630 today for transport to home address.  Rn is aware.  Final next level of care: Home w Hospice Care Barriers to Discharge: Barriers Resolved   Patient Goals and CMS Choice Patient states their goals for this hospitalization and ongoing recovery are:: to go home CMS Medicare.gov Compare Post Acute Care list provided to:: Patient    Discharge Placement                       Discharge Plan and Services   Discharge Planning Services: CM Consult                                 Social Determinants of Health (SDOH) Interventions     Readmission Risk Interventions No flowsheet data found.

## 2021-03-22 NOTE — Discharge Summary (Signed)
Discharge Summary  [name] QMV:[id number] DOB: [birth year]  PCP: Sueanne Margarita, DO  Admit date: 03/13/2021 Discharge date: 03/22/2021  Time spent: 40 mins  Recommendations for Outpatient Follow-up:  Home hospice  Discharge Diagnoses:  Active Hospital Problems   Diagnosis Date Noted   Severe sepsis ([facility]) due to rhinovirus infection 03/13/2021   Hospice care patient 03/20/2021   Inadequate oral nutritional intake 03/18/2021   Myoclonic jerking 03/17/2021   Cellulitis of right lower leg 03/14/2021   Constipation 03/14/2021   Hypomagnesemia 03/14/2021   Hypophosphatemia 28/41/3244   Acute metabolic encephalopathy 04/17/7251   Hip pain 03/14/2021   Goals of care, counseling/discussion 03/14/2021   Moderate dementia with behavioral disturbance ([facility]) 03/17/2017   HLD (hyperlipidemia) 01/10/2015   Asthma, chronic, unspecified asthma severity, uncomplicated 66/44/0347   CAD (coronary artery disease)    HTN (hypertension)     Resolved Hospital Problems  No resolved problems to display.    Discharge Condition: Fair  Diet recommendation: Comfort feeds  Vitals:   03/22/21 0845 03/22/21 1422  BP:    Pulse:    Resp:    Temp:    SpO2: 98% 98%    History of present illness:  11/29, admitted with worsening mentation, fever and sepsis. 11/30 RVP positive for rhinovirus. 12/1 mentation improving, not able to follow commands 12/3, mentation waxing and waning.  Palliative care consulted.  Transition to complete comfort     Today, pt unable to engage in any meaningful conversation. Wife at bedside    Hospital Course:  Principal Problem:   Severe sepsis ([facility]) due to rhinovirus infection Active Problems:   HTN (hypertension)   CAD (coronary artery disease)   Asthma, chronic, unspecified asthma severity, uncomplicated   Moderate dementia with behavioral disturbance ([facility])   HLD (hyperlipidemia)   Cellulitis of right lower leg   Constipation    Hypomagnesemia   Hypophosphatemia   Acute metabolic encephalopathy   Hip pain   Goals of care, counseling/discussion   Myoclonic jerking   Inadequate oral nutritional intake   Hospice care patient   Severe sepsis ([facility]) due to rhinovirus infection Met SIRS criteria on admission.  With worsening mentation organ damage Remains tachypneic.  Upper airway wheezing.  With poor secretion clearance. Chest x-ray negative Blood cultures so far negative Rhinovirus test is positive.  Influenza and COVID-negative Home hospice for comfort measures   Acute metabolic encephalopathy Moderate dementia with behavioral disturbance Possibly delirium secondary to sepsis Vs ongoing progressive dementia Currently mentation not improving, not back to baseline. At baseline the patient is able to ambulate, feed himself Metabolic work-up including TSH, ammonia level normal.  VBG did not show any hypercarbia.  LFTs normal.  EKG unremarkable.  CT head negative for any acute intracranial abnormality.  Blood culture and urine culture also negative Zyprexa for agitation   Cellulitis of right lower leg Treated with IV vancomycin and Fortaz.  Transition to doxycycline.  Completed 5-day treatment therapy. So far cultures are negative.  Cellulitis has resolved   Asthma, chronic, unspecified asthma severity, uncomplicated Continue with respiratory distress.  Continue with nebulizer therapy with Xopenex due to poor compliance with inhaler therapy   Poor oral nutritional intake Patient with ongoing delirium.  Progressive dementia. Remains at risk for not meeting nutritional demand. Not a good candidate for feeding tube placement secondary to his dementia which is likely the cause of his anorexia as well as dysphagia.  Family also not keen on feeding tube or prolonging his suffering Comfort feeds  Goals of care, counseling/discussion Multiple discussion with the family.  Palliative care consulted Goal is to focus on  comfort.  Transition to comfort care for now with transition to home hospice   R Hip pain On admission patient reports any pain while moving his right leg. X-ray had some concerns regarding hip fracture which was ruled out on CT scan.  Discussed with radiology personally   HTN Blood pressure stable   Myoclonic jerking Associated with confusion EEG negative for any seizure or epileptogenic foci. Intermittent in nature likely associated with encephalopathy. There was some initial concern for serotonin syndrome although appears that the patient is suffering from rhinovirus infection which might be responsible for delirium. Currently comfort care      Malnutrition Type:  Nutrition Problem: Inadequate oral intake Etiology: decreased appetite, inability to eat   Malnutrition Characteristics:  Signs/Symptoms: per patient/family report, NPO status   Nutrition Interventions:  Interventions: Refer to RD note for recommendations   Estimated body mass index is 26.85 kg/m as calculated from the following:   Height as of this encounter: _0  (1.727 m).   Weight as of this encounter: 80.1 kg.    Procedures: None  Consultations: Palliative care  Discharge Exam: BP (!) 168/139 (BP Location: Other (Comment)) Comment (BP Location): L leg  Pulse 84   Temp 97.9 F (36.6 C) (Axillary)   Resp 20   Ht _1  (1.727 m)   Wt 80.1 kg   SpO2 98%   BMI 26.85 kg/m   General: Not able to engage in any meaningful conversation, confused, not alert or oriented Cardiovascular: S1, S2 present Respiratory: Coarse breath sounds Abdomen: Soft, nontender, nondistended, bowel sounds present Musculoskeletal: No bilateral pedal edema noted Skin: Normal Psychiatry: Unable to assess   Discharge Instructions You were cared for by a hospitalist during your hospital stay. If you have any questions about your discharge medications or the care you received while you were in the hospital after you  are discharged, you can call the unit and asked to speak with the hospitalist on call if the hospitalist that took care of you is not available. Once you are discharged, your primary care physician will handle any further medical issues. Please note that NO REFILLS for any discharge medications will be authorized once you are discharged, as it is imperative that you return to your primary care physician (or establish a relationship with a primary care physician if you do not have one) for your aftercare needs so that they can reassess your need for medications and monitor your lab values.  Discharge Instructions     Diet - low sodium heart healthy   Complete by: As directed    Diet general   Complete by: As directed    Increase activity slowly   Complete by: As directed    Increase activity slowly   Complete by: As directed       Allergies as of 03/22/2021       Reactions   Aricept [donepezil]    Upset stomach   Depakote [divalproex Sodium]    Made me feel like a different person   Penicillins Itching, Rash   Has patient had a PCN reaction causing immediate rash, facial/tongue/throat swelling, SOB or lightheadedness with hypotension: YES Has patient had a PCN reaction causing severe rash involving mucus membranes or skin necrosis: NO Has patient had a PCN reaction that required hospitalization: NO Has patient had a PCN reaction occurring within the last 10 years: NO If  all of the above answers are "NO", then may proceed with Cephalosporin use.        Medication List     STOP taking these medications    aspirin 81 MG tablet   atorvastatin 10 MG tablet Commonly known as: LIPITOR   calcium carbonate 500 MG chewable tablet Commonly known as: TUMS - dosed in mg elemental calcium   EQ MULTIVITAMINS ADULT GUMMY PO   MELATIN PO   VitaJoy Daily D Gummies 25 MCG (1000 UT) Chew Generic drug: Cholecalciferol   vitamin C 1000 MG tablet       TAKE these medications     atropine 1 % ophthalmic solution Place 1 drop under the tongue 3 (three) times daily as needed (excessive secretion).   benzonatate 100 MG capsule Commonly known as: TESSALON Take 1 capsule (100 mg total) by mouth 3 (three) times daily as needed for cough.   bisacodyl 10 MG suppository Commonly known as: DULCOLAX Place 1 suppository (10 mg total) rectally daily as needed for moderate constipation.   cefdinir 300 MG capsule Commonly known as: OMNICEF Take 1 capsule (300 mg total) by mouth 2 (two) times daily for 5 days.   escitalopram 20 MG tablet Commonly known as: Lexapro Take 1 tablet (20 mg total) by mouth daily.   feeding supplement Liqd Take 237 mLs by mouth 2 (two) times daily between meals.   guaiFENesin-dextromethorphan 100-10 MG/5ML syrup Commonly known as: ROBITUSSIN DM Take 5 mLs by mouth every 4 (four) hours as needed for cough.   haloperidol 2 MG tablet Commonly known as: HALDOL Take 1 tablet (2 mg total) by mouth every 4 (four) hours as needed for agitation (or delirium).   levalbuterol 0.63 MG/3ML nebulizer solution Commonly known as: XOPENEX Take 3 mLs (0.63 mg total) by nebulization every 6 (six) hours.   LORazepam 1 MG tablet Commonly known as: ATIVAN Take 0.5-1 tablets (0.5-1 mg total) by mouth every 4 (four) hours as needed for anxiety.   losartan 50 MG tablet Commonly known as: COZAAR Take 50 mg by mouth daily.   metroNIDAZOLE 500 MG tablet Commonly known as: FLAGYL Take 1 tablet (500 mg total) by mouth 2 (two) times daily for 5 days.   morphine CONCENTRATE 10 MG/0.5ML Soln concentrated solution Take 0.25 mLs (5 mg total) by mouth every 2 (two) hours as needed for moderate pain, severe pain or shortness of breath (or dyspnea).   NON FORMULARY Take 5 mLs by mouth 2 (two) times daily. Medication CBD oil BID   OLANZapine 2.5 MG tablet Commonly known as: ZYPREXA Take 1 tablet (2.5 mg total) by mouth at bedtime.   polyethylene glycol powder  17 GM/SCOOP powder Commonly known as: GLYCOLAX/MIRALAX Take 17 g by mouth daily.   REFRESH EYE ITCH RELIEF OP Apply to eye.   rivastigmine 4.6 mg/24hr Commonly known as: EXELON Place 1 patch (4.6 mg total) onto the skin daily.       Allergies  Allergen Reactions   Aricept [Donepezil]     Upset stomach   Depakote [Divalproex Sodium]     Made me feel like a different person   Penicillins Itching and Rash    Has patient had a PCN reaction causing immediate rash, facial/tongue/throat swelling, SOB or lightheadedness with hypotension: YES Has patient had a PCN reaction causing severe rash involving mucus membranes or skin necrosis: NO Has patient had a PCN reaction that required hospitalization: NO Has patient had a PCN reaction occurring within the last 10 years: NO  If all of the above answers are "NO", then may proceed with Cephalosporin use.      The results of significant diagnostics from this hospitalization (including imaging, microbiology, ancillary and laboratory) are listed below for reference.    Significant Diagnostic Studies: CT Abdomen Pelvis Wo Contrast  Result Date: 03/13/2021 CLINICAL DATA:  Increased agitation, leukocytosis, suspected abscess/infection, history prostate cancer, coronary artery disease EXAM: CT ABDOMEN AND PELVIS WITHOUT CONTRAST TECHNIQUE: Multidetector CT imaging of the abdomen and pelvis was performed following the standard protocol without IV contrast. Scattered respiratory motion artifacts. COMPARISON:  01/05/2018 FINDINGS: Lower chest: Subsegmental atelectasis RIGHT lower lobe. Hepatobiliary: Gallbladder and liver normal appearance Pancreas: Normal appearance Spleen: Normal appearance Adrenals/Urinary Tract: Adrenal glands, kidneys, ureters, and bladder normal appearance Stomach/Bowel: Bowel interposition between liver and diaphragm. Increased stool in rectum and distal colon. Stomach decompressed. Remaining bowel loops normal. Appendix surgically  absent by history. Vascular/Lymphatic: Atherosclerotic calcifications aorta and coronary arteries. Aorta normal caliber. No adenopathy. Reproductive: Prostate gland and seminal vesicles surgically absent Other: Small umbilical and infraumbilical ventral hernias containing fat. LEFT inguinal hernia containing fat. No free air or free fluid. Musculoskeletal: Osseous demineralization. IMPRESSION: Increased stool in rectum and distal colon. Small umbilical and infraumbilical ventral hernias containing fat. LEFT inguinal hernia containing fat. No acute intra-abdominal or intrapelvic abnormalities. Aortic Atherosclerosis (ICD10-I70.0). Electronically Signed   By: Lavonia Dana M.D.   On: 03/13/2021 19:45   DG Chest 1 View  Result Date: 03/13/2021 CLINICAL DATA:  Fall. EXAM: CHEST  1 VIEW COMPARISON:  Chest x-ray 08/16/2019. FINDINGS: There is stable moderate elevation of the right hemidiaphragm. There is no lung consolidation, pleural effusion or pneumothorax. Cardiomediastinal silhouette is stable, the heart is enlarged. Prosthetic heart valve again noted. No acute fractures are seen. IMPRESSION: 1. No acute cardiopulmonary process. 2. Stable cardiomegaly and elevation of the right hemidiaphragm. Electronically Signed   By: Ronney Asters M.D.   On: 03/13/2021 16:58   DG Lumbar Spine Complete  Result Date: 03/13/2021 CLINICAL DATA:  Fall EXAM: LUMBAR SPINE - COMPLETE 4+ VIEW COMPARISON:  CT abdomen/pelvis 01/05/2018 FINDINGS: There are 5 non-rib-bearing lumbar type vertebral bodies. Mild compression deformity of the T11 vertebral body is similar in appearance to the prior CT from 2019. Vertebral body heights otherwise appear preserved, without definite evidence of acute fracture. Alignment is normal, with no significant antero or retrolisthesis. A right-sided pars defect is seen at L5-S1, as seen on prior CT. There is no definite evidence of spondylolysis at the other levels. There is mild multilevel degenerative  endplate change and disc space narrowing. There is multilevel facet arthropathy, most advanced at L4-L5 and L5-S1. There is mild gaseous distention of the bowel throughout the abdomen. IMPRESSION: 1. No definite evidence of acute traumatic injury in the lumbar spine. 2. Multilevel degenerative changes as above. 3. Mild gaseous distention of the bowel throughout the abdomen without evidence of mechanical obstruction, nonspecific. Electronically Signed   By: Valetta Mole M.D.   On: 03/13/2021 16:57   DG Pelvis 1-2 Views  Result Date: 03/13/2021 CLINICAL DATA:  Fall. EXAM: PELVIS - 1-2 VIEW COMPARISON:  None. FINDINGS: There is foreshortening of the right femoral neck. Subcapital fracture can not be excluded. There is no dislocation. Joint spaces are maintained. Soft tissues are within normal limits. Surgical clips overlie the right inguinal region. IMPRESSION: 1. Can not exclude subcapital right femoral neck fracture. Recommend dedicated views of the right hip for further evaluation. Electronically Signed   By: Tina Griffiths.D.  On: 03/13/2021 16:57   DG Wrist Complete Right  Result Date: 03/14/2021 CLINICAL DATA:  Fall EXAM: RIGHT WRIST - COMPLETE 3+ VIEW COMPARISON:  None. FINDINGS: There is no evidence of fracture or dislocation. There is no evidence of arthropathy or other focal bone abnormality. Soft tissues are unremarkable. IMPRESSION: Negative. Electronically Signed   By: Franchot Gallo M.D.   On: 03/14/2021 12:58   CT HEAD WO CONTRAST (5MM)  Result Date: 03/13/2021 CLINICAL DATA:  Status post fall. EXAM: CT HEAD WITHOUT CONTRAST TECHNIQUE: Contiguous axial images were obtained from the base of the skull through the vertex without intravenous contrast. COMPARISON:  April 23, 2019 FINDINGS: Brain: There is mild to moderate severity cerebral atrophy with widening of the extra-axial spaces and ventricular dilatation. There are areas of decreased attenuation within the white matter tracts of the  supratentorial brain, consistent with microvascular disease changes. Vascular: Calcification of the right cavernous carotid arteries noted. Skull: Normal. Negative for fracture or focal lesion. Sinuses/Orbits: No acute finding. Other: None. IMPRESSION: 1. Generalized cerebral atrophy. 2. No acute intracranial abnormality. Electronically Signed   By: Virgina Norfolk M.D.   On: 03/13/2021 22:12   DG CHEST PORT 1 VIEW  Result Date: 03/14/2021 CLINICAL DATA:  Shortness of breath EXAM: PORTABLE CHEST 1 VIEW COMPARISON:  Chest radiograph dated March 13, 2021 FINDINGS: The heart is enlarged. Chronic moderate elevation of the right hemidiaphragm is unchanged. Lungs are otherwise clear. IMPRESSION: No active disease.  Stable chronic findings. Electronically Signed   By: Keane Police D.O.   On: 03/14/2021 10:48   DG Foot 2 Views Right  Result Date: 03/13/2021 CLINICAL DATA:  Cellulitis. EXAM: RIGHT FOOT - 2 VIEW COMPARISON:  None. FINDINGS: There is no evidence of fracture or dislocation. There is no evidence of arthropathy or other focal bone abnormality. Soft tissues are unremarkable. IMPRESSION: Negative. Electronically Signed   By: Ronney Asters M.D.   On: 03/13/2021 21:49   EEG adult  Result Date: 03/14/2021 Lora Havens, MD     03/14/2021  5:50 PM Patient Name: AIREN STIEHL MRN: [id number] Epilepsy Attending: Lora Havens Referring Physician/Provider: Dr Berle Mull Date: 03/14/2021 Duration: 22.24 mins Patient history: 80yo M with ams. EEG to evaluate for seizure Level of alertness:  lethargic AEDs during EEG study: None Technical aspects: This EEG study was done with scalp electrodes positioned according to the 10-20 International system of electrode placement. Electrical activity was acquired at a sampling rate of 500Hz and reviewed with a high frequency filter of 70Hz and a low frequency filter of 1Hz. EEG data were recorded continuously and digitally stored. Description: EEG  showed continuous generalized 3 to 6 Hz theta-delta slowing, at times with triphasic morphology.  Hyperventilation and photic stimulation were not performed.   ABNORMALITY - Continuous slow, generalized IMPRESSION: This study is suggestive of moderate diffuse encephalopathy, nonspecific etiology but could be secondary toxic-metabolic etiology, anoxic/hypoxic brain injury. No seizures or epileptiform discharges were seen throughout the recording. Lora Havens   ECHOCARDIOGRAM COMPLETE  Result Date: 03/17/2021    ECHOCARDIOGRAM REPORT   Patient Name:   ZIMRI BRENNEN Date of Exam: 03/15/2021 Medical Rec #:  [id number]          Height:       68.0 in Accession #:    [id number]         Weight:       170.4 lb Date of Birth:  1940/05/30  BSA:          1.909 m Patient Age:    43 years           BP:           152/82 mmHg Patient Gender: M                  HR:           85 bpm. Exam Location:  Inpatient Procedure: 2D Echo Indications:     Fever  History:         Patient has prior history of Echocardiogram examinations, most                  recent 02/27/2018. CAD, Sepsis.; Risk Factors:Hypertension and                  Dyslipidemia.                   Mitral Valve: prosthetic annuloplasty ring valve is present in                  the mitral position.  Sonographer:     Johny Chess RDCS Referring Phys:  [place] Diagnosing Phys: Adrian Prows MD  Sonographer Comments: Image acquisition challenging due to uncooperative patient. IMPRESSIONS  1. LV filling is indeterminate due to MV repair. Left ventricular ejection fraction, by estimation, is 60 to 65%. The left ventricle has normal function. The left ventricle has no regional wall motion abnormalities. Left ventricular diastolic parameters  are indeterminate.  2. Right ventricular systolic function is normal. The right ventricular size is normal.  3. Left atrial size was moderately dilated.  4. Appears grossly normal.  5. The mitral valve has  been repaired/replaced. Trivial mitral valve regurgitation. Mild mitral stenosis. There is a prosthetic annuloplasty ring present in the mitral position.  6. The aortic valve was not well visualized. Aortic valve regurgitation is not visualized. Severe aortic valve stenosis. Aortic valve area, by VTI measures 0.49 cm. Aortic valve mean gradient measures 46.0 mmHg. Aortic valve Vmax measures 4.31 m/s. FINDINGS  Left Ventricle: LV filling is indeterminate due to MV repair. Left ventricular ejection fraction, by estimation, is 60 to 65%. The left ventricle has normal function. The left ventricle has no regional wall motion abnormalities. The left ventricular internal cavity size was normal in size. There is no left ventricular hypertrophy. Left ventricular diastolic parameters are indeterminate. Right Ventricle: The right ventricular size is normal. No increase in right ventricular wall thickness. Right ventricular systolic function is normal. Left Atrium: Left atrial size was moderately dilated. Right Atrium: Right atrial size was normal in size. Pericardium: Appears grossly normal. The pericardium was not well visualized. Mitral Valve: The mitral valve has been repaired/replaced. Trivial mitral valve regurgitation. There is a prosthetic annuloplasty ring present in the mitral position. Mild mitral valve stenosis. MV peak gradient, 17.1 mmHg. The mean mitral valve gradient  is 6.0 mmHg. Tricuspid Valve: The tricuspid valve is normal in structure. Tricuspid valve regurgitation is not demonstrated. No evidence of tricuspid stenosis. Aortic Valve: The aortic valve was not well visualized. Aortic valve regurgitation is not visualized. Severe aortic stenosis is present. Aortic valve mean gradient measures 46.0 mmHg. Aortic valve peak gradient measures 74.3 mmHg. Aortic valve area, by VTI measures 0.49 cm. Pulmonic Valve: The pulmonic valve was normal in structure. Pulmonic valve regurgitation is not visualized. No  evidence of pulmonic stenosis. Aorta: The aortic root is normal in  size and structure. IAS/Shunts: No atrial level shunt detected by color flow Doppler.  LEFT VENTRICLE PLAX 2D LVIDd:         4.20 cm   Diastology LVIDs:         3.00 cm   LV e' medial:   7.62 cm/s LV PW:         1.20 cm   LV E/e' medial: 24.0 LV IVS:        1.10 cm LVOT diam:     1.90 cm LV SV:         38 LV SV Index:   20 LVOT Area:     2.84 cm  RIGHT VENTRICLE RV S prime:     10.40 cm/s TAPSE (M-mode): 2.1 cm LEFT ATRIUM             Index        RIGHT ATRIUM           Index LA diam:        4.70 cm 2.46 cm/m   RA Area:     10.60 cm LA Vol (A2C):   63.5 ml 33.26 ml/m  RA Volume:   22.20 ml  11.63 ml/m LA Vol (A4C):   48.1 ml 25.19 ml/m LA Biplane Vol: 55.9 ml 29.28 ml/m  AORTIC VALVE AV Area (Vmax):    0.51 cm AV Area (Vmean):   0.45 cm AV Area (VTI):     0.49 cm AV Vmax:           431.00 cm/s AV Vmean:          321.500 cm/s AV VTI:            0.774 m AV Peak Grad:      74.3 mmHg AV Mean Grad:      46.0 mmHg LVOT Vmax:         77.70 cm/s LVOT Vmean:        51.000 cm/s LVOT VTI:          0.135 m LVOT/AV VTI ratio: 0.17  AORTA Ao Root diam: 2.70 cm MITRAL VALVE                TRICUSPID VALVE MV Area (PHT): 3.21 cm     TR Peak grad:   44.4 mmHg MV Area VTI:   0.88 cm     TR Vmax:        333.00 cm/s MV Peak grad:  17.1 mmHg MV Mean grad:  6.0 mmHg     SHUNTS MV Vmax:       2.07 m/s     Systemic VTI:  0.14 m MV Vmean:      109.0 cm/s   Systemic Diam: 1.90 cm MV Decel Time: 236 msec MV E velocity: 183.00 cm/s MV A velocity: 148.00 cm/s MV E/A ratio:  1.24 Adrian Prows MD Electronically signed by Adrian Prows MD Signature Date/Time: 03/17/2021/2:54:08 PM    Final    DG Hip Unilat W or Wo Pelvis 2-3 Views Right  Result Date: 03/13/2021 CLINICAL DATA:  Fall EXAM: DG HIP (WITH OR WITHOUT PELVIS) 2-3V RIGHT COMPARISON:  None. FINDINGS: SI joints are non widened. Pubic symphysis and rami appear intact. Clips in the right groin. No fracture or  malalignment. IMPRESSION: No acute osseous abnormality Electronically Signed   By: Donavan Foil M.D.   On: 03/13/2021 19:05    Microbiology: Recent Results (from the past 240 hour(s))  Blood Culture (routine x 2)     Status:  None   Collection Time: 03/13/21  5:00 PM   Specimen: BLOOD  Result Value Ref Range Status   Specimen Description   Final    BLOOD RIGHT ANTECUBITAL Performed at [facility] [address] [place], [place] [address]    Special Requests   Final    BOTTLES DRAWN AEROBIC AND ANAEROBIC Blood Culture adequate volume Performed at [facility] [address], [place], [place] [address]    Culture   Final    NO GROWTH 5 DAYS Performed at [facility], [place] [address] [place], [place] [address]    Report Status 03/18/2021 FINAL  Final  Resp Panel by RT-PCR (Flu A&B, Covid)     Status: None   Collection Time: 03/13/21  5:00 PM   Specimen: Nasopharyngeal(NP) swabs in vial transport medium  Result Value Ref Range Status   SARS Coronavirus 2 by RT PCR NEGATIVE NEGATIVE Final    Comment: (NOTE) SARS-CoV-2 target nucleic acids are NOT DETECTED.  The SARS-CoV-2 RNA is generally detectable in upper respiratory specimens during the acute phase of infection. The lowest concentration of SARS-CoV-2 viral copies this assay can detect is 138 copies/mL. A negative result does not preclude SARS-Cov-2 infection and should not be used as the sole basis for treatment or other patient management decisions. A negative result may occur with  improper specimen collection/handling, submission of specimen other than nasopharyngeal swab, presence of viral mutation(s) within the areas targeted by this assay, and inadequate number of viral copies(<138 copies/mL). A negative result must be combined with clinical observations, patient history, and epidemiological information. The expected result is Negative.  Fact Sheet for Patients:   EntrepreneurPulse.com.au  Fact Sheet for Healthcare Providers:  IncredibleEmployment.be  This test is no t yet approved or cleared by the [place] FDA and  has been authorized for detection and/or diagnosis of SARS-CoV-2 by FDA under an Emergency Use Authorization (EUA). This EUA will remain  in effect (meaning this test can be used) for the duration of the COVID-19 declaration under Section 564(b)(1) of the Act, 21 U.S.C.section 360bbb-3(b)(1), unless the authorization is terminated  or revoked sooner.       Influenza A by PCR NEGATIVE NEGATIVE Final   Influenza B by PCR NEGATIVE NEGATIVE Final    Comment: (NOTE) The Xpert Xpress SARS-CoV-2/FLU/RSV plus assay is intended as an aid in the diagnosis of influenza from Nasopharyngeal swab specimens and should not be used as a sole basis for treatment. Nasal washings and aspirates are unacceptable for Xpert Xpress SARS-CoV-2/FLU/RSV testing.  Fact Sheet for Patients: EntrepreneurPulse.com.au  Fact Sheet for Healthcare Providers: IncredibleEmployment.be  This test is not yet approved or cleared by the [place] FDA and has been authorized for detection and/or diagnosis of SARS-CoV-2 by FDA under an Emergency Use Authorization (EUA). This EUA will remain in effect (meaning this test can be used) for the duration of the COVID-19 declaration under Section 564(b)(1) of the Act, 21 U.S.C. section 360bbb-3(b)(1), unless the authorization is terminated or revoked.  Performed at [facility], [place] [address] [place], [place] [address]   Blood Culture (routine x 2)     Status: None   Collection Time: 03/13/21  5:15 PM   Specimen: BLOOD  Result Value Ref Range Status   Specimen Description   Final    BLOOD RIGHT ANTECUBITAL Performed at [facility] [address] [place], [place] [address]    Special Requests  Final    BOTTLES DRAWN AEROBIC AND ANAEROBIC Blood Culture adequate volume Performed at [facility] [address] [place], [place] [address]    Culture   Final    NO GROWTH 5 DAYS Performed at [facility], [place] [address], [place], [place] [address]    Report Status 03/18/2021 FINAL  Final  Urine Culture     Status: None   Collection Time: 03/13/21  5:30 PM   Specimen: In/Out Cath Urine  Result Value Ref Range Status   Specimen Description   Final    IN/OUT CATH URINE Performed at [facility] [address] [place], [place] [address]    Special Requests   Final    NONE Performed at [facility], [place] [address], [place], [place] [address]    Culture   Final    NO GROWTH Performed at [facility], [place] [address] [place], [place] [address]    Report Status 03/14/2021 FINAL  Final  Respiratory (~20 pathogens) panel by PCR     Status: Abnormal   Collection Time: 03/14/21 10:43 AM   Specimen: Nasopharyngeal Swab; Respiratory  Result Value Ref Range Status   Adenovirus NOT DETECTED NOT DETECTED Final   Coronavirus 229E NOT DETECTED NOT DETECTED Final    Comment: (NOTE) The Coronavirus on the Respiratory Panel, DOES NOT test for the novel  Coronavirus (2019 nCoV)    Coronavirus HKU1 NOT DETECTED NOT DETECTED Final   Coronavirus NL63 NOT DETECTED NOT DETECTED Final   Coronavirus OC43 NOT DETECTED NOT DETECTED Final   Metapneumovirus NOT DETECTED NOT DETECTED Final   Rhinovirus / Enterovirus DETECTED (A) NOT DETECTED Final   Influenza A NOT DETECTED NOT DETECTED Final   Influenza B NOT DETECTED NOT DETECTED Final   Parainfluenza Virus 1 NOT DETECTED NOT DETECTED Final   Parainfluenza Virus 2 NOT DETECTED NOT DETECTED Final   Parainfluenza Virus 3 NOT DETECTED NOT DETECTED Final   Parainfluenza Virus 4 NOT DETECTED NOT DETECTED Final   Respiratory Syncytial Virus NOT DETECTED NOT DETECTED Final    Bordetella pertussis NOT DETECTED NOT DETECTED Final   Bordetella Parapertussis NOT DETECTED NOT DETECTED Final   Chlamydophila pneumoniae NOT DETECTED NOT DETECTED Final   Mycoplasma pneumoniae NOT DETECTED NOT DETECTED Final    Comment: Performed at [facility], [place]. [address], [place], [place] [address]  SARS Coronavirus 2 by RT PCR (hospital order, performed in [facility] hospital lab)     Status: None   Collection Time: 03/19/21  1:04 PM  Result Value Ref Range Status   SARS Coronavirus 2 NEGATIVE NEGATIVE Final    Comment: (NOTE) SARS-CoV-2 target nucleic acids are NOT DETECTED.  The SARS-CoV-2 RNA is generally detectable in upper and lower respiratory specimens during the acute phase of infection. The lowest concentration of SARS-CoV-2 viral copies this assay can detect is 250 copies / mL. A negative result does not preclude SARS-CoV-2 infection and should not be used as the sole basis for treatment or other patient management decisions.  A negative result may occur with improper specimen collection / handling, submission of specimen other than nasopharyngeal swab, presence of viral mutation(s) within the areas targeted by this assay, and inadequate number of viral copies (<250 copies / mL). A negative result must be combined with clinical observations, patient history, and epidemiological information.  Fact Sheet for Patients:   StrictlyIdeas.no  Fact Sheet for Healthcare Providers: BankingDealers.co.za  This test  is not yet approved or  cleared by the [place] and has been authorized for detection and/or diagnosis of SARS-CoV-2 by FDA under an Emergency Use Authorization (EUA).  This EUA will remain in effect (meaning this test can be used) for the duration of the COVID-19 declaration under Section 564(b)(1) of the Act, 21 U.S.C. section 360bbb-3(b)(1), unless the authorization is terminated or revoked  sooner.  Performed at [facility], [place] [address] [place], [place] [address]      Labs: Basic Metabolic Panel: Recent Labs  Lab 03/16/21 0414  NA 137  K 4.2  CL 107  CO2 26  GLUCOSE 131*  BUN 21  CREATININE 1.09  CALCIUM 8.3*  MG 2.1   Liver Function Tests: Recent Labs  Lab 03/16/21 0414  AST 47*  ALT 27  ALKPHOS 59  BILITOT 0.9  PROT 6.2*  ALBUMIN 3.4*   No results for input(s): LIPASE, AMYLASE in the last 168 hours. No results for input(s): AMMONIA in the last 168 hours. CBC: Recent Labs  Lab 03/16/21 0414  WBC 8.1  NEUTROABS 6.1  HGB 13.9  HCT 41.6  MCV 95.0  PLT 123*   Cardiac Enzymes: No results for input(s): CKTOTAL, CKMB, CKMBINDEX, TROPONINI in the last 168 hours. BNP: BNP (last 3 results) Recent Labs    03/13/21 2314  BNP 155.1*    ProBNP (last 3 results) No results for input(s): PROBNP in the last 8760 hours.  CBG: No results for input(s): GLUCAP in the last 168 hours.     Signed:  Alma Friendly, MD [facility] 03/22/2021, 3:10 PM

## 2021-03-27 ENCOUNTER — Other Ambulatory Visit: Payer: Self-pay | Admitting: Internal Medicine

## 2021-04-02 ENCOUNTER — Telehealth: Payer: Self-pay | Admitting: Physician Assistant

## 2021-04-02 NOTE — Telephone Encounter (Signed)
Patient's daughter [name] called to get some advice. The patient was in the hospital but is out now.   His daughter [name] is needing a call back to discuss the medications offered as the family is not comfortable with the recommendations from the hospital wanting to add in ativan and halidol and zyprexa.

## 2021-04-02 NOTE — Telephone Encounter (Signed)
Pls let her know that he was likely having delirium due to being in the hospital so they added those medications. Since he is doing fine, okay to continue on his current management without those meds. Thanks

## 2021-04-02 NOTE — Telephone Encounter (Signed)
Pt daughter states the patient doing well now. He is eating solid foods now, his bowl movement and urinating as well.  More alert now then when he was in the hospital.  Daughter feels like he do not need add the Haldol and Ativan, Zyprexa but she just wants Dr.Aqunio opinion. He is doing well on his current management. Pt taking the Melatin 3 mg daily but she is unable to find it liquid form or a form she can crush.  Please advise

## 2021-04-02 NOTE — Telephone Encounter (Signed)
Pt daughter advised. Of Dr.Aqunio, Pls let her know that he was likely having delirium due to being in the hospital so they added those medications. Since he is doing fine, okay to continue on his current management without those meds. Thanks.    Patient daughter wanted to know Melatin 5 mg should she crush or cut in half to make 3 mg. Or is there a liquid form of the 3 mg.   She know she advised that less is best.

## 2021-04-02 NOTE — Telephone Encounter (Signed)
She can try cutting in half, but it hard to cut, she can just get a 3mg  tablet or there is a liquid formulation (used in kids) that she can also get. thanks

## 2021-04-04 NOTE — Telephone Encounter (Signed)
Pt daughter advised, She can try cutting in half, but it hard to cut, she can just get a 3mg  tablet or there is a liquid formulation (used in kids) that she can also get. Thanks.   Per daughter pt doing well on patch, CBD, Melatin.

## 2021-04-11 ENCOUNTER — Telehealth: Payer: Self-pay | Admitting: *Deleted

## 2021-04-11 NOTE — Telephone Encounter (Signed)
Patient's wife, Dolores Hoose. [facility]) called regarding questions about the nebulizer medications prescribed to him.  She states he is not coughing, wheezing or having SOB.  He has dementia and is on palliative care now.  I advised that the medications that he is prescribed to be put in the nebulizer are for the above symptoms and if he is not having them, she does not have to use them.  She wanted to know if there were any side effects from using them without the symptoms.  I advised her that Pulmicort is to be used daily and the levo albuterol is every 6 hours as needed.  Advised that the levo albuterol increased the heart rate and can cause nervousness and jitteriness.  She verbalized understanding.  Nothing further needed.

## 2021-05-18 ENCOUNTER — Other Ambulatory Visit: Payer: Self-pay

## 2021-05-18 ENCOUNTER — Telehealth (INDEPENDENT_AMBULATORY_CARE_PROVIDER_SITE_OTHER): Payer: Medicare Other | Admitting: Physician Assistant

## 2021-05-18 ENCOUNTER — Other Ambulatory Visit: Payer: Self-pay | Admitting: Physician Assistant

## 2021-05-18 ENCOUNTER — Encounter: Payer: Self-pay | Admitting: Physician Assistant

## 2021-05-18 VITALS — Ht 69.0 in | Wt 160.0 lb

## 2021-05-18 DIAGNOSIS — F03B18 Unspecified dementia, moderate, with other behavioral disturbance: Secondary | ICD-10-CM

## 2021-05-18 DIAGNOSIS — F03C18 Unspecified dementia, severe, with other behavioral disturbance: Secondary | ICD-10-CM

## 2021-05-18 MED ORDER — ESCITALOPRAM OXALATE 20 MG PO TABS: 20.0000 mg | ORAL_TABLET | Freq: Every day | ORAL | 3 refills | Status: DC

## 2021-05-18 MED ORDER — RIVASTIGMINE 4.6 MG/24HR TD PT24: 4.6000 mg | MEDICATED_PATCH | Freq: Every day | TRANSDERMAL | 11 refills | Status: DC

## 2021-05-18 NOTE — Progress Notes (Signed)
Virtual Visit via Video Note The purpose of this virtual visit is to provide medical care while limiting exposure to the novel coronavirus.    Consent from POA was obtained for video visit:  Yes.   Answered questions that patient had about telehealth interaction:  Yes.   I discussed the limitations, risks, security and privacy concerns of performing an evaluation and management service by telemedicine. I also discussed with the patient that there may be a patient responsible charge related to this service. The patient expressed understanding and agreed to proceed.  Pt location: Home Physician Location: office Name of referring provider:  Sueanne Margarita, DO I connected with [name] at patients initiation/request on 05/18/2021 at  3:30 PM EST by video enabled telemedicine application and verified that I am speaking with the correct person using two identifiers. Pt MRN:  [id number] Pt DOB:  Mar 14, 1941 Video Participants:  [name];  [name] (spouse)   History of Present Illness:  The patient had a virtual video visit on 02/14/2021. He was last evaluated in the Neurology clinic 6 months ago for  severe Alzheimer's disease. He is now in memory care at [facility] and has been pretty stable now that he is more familiar with staff and environment. He lets staff/caregivers dress and bathe him without issues. He o longer ambulates he is wheelchair bound.  He is non-verbal for today's visit, does not follow commands. He is no longer able to feed himself, caregivers do  Family is in the process of trying to find an night caregiver.   [name] thinks he is sleeping well. His has been discharged recently from the hospital with acute mental status changes.  Apparently in the hospital, the patient received medication such as Ativan, which did not agree with him, causing more delirium.  He returned to melatonin 3 mg, Lexapro, his mental status is improved.  She states that at 1 point,  they were going to call become place, but then "he woke up, he was eating better ".  He is also on rivastigmine 4.6mg  patch/24 hr without side effects.  History on Initial Assessment 11/23/2018: This is a 81 year old left-handed man with a history of hypertension, prostate cancer, presenting to establish care for dementia. Family reports memory changes started 3-4 years ago where he would not remember names. Later on he did not recognize friends. His wife reports taking over finances in 2019, it appears he still did the taxes in 2019 (despite being diagnosed with dementia in 2018). He has been evaluated by neurologist Dr. Brett Fairy in 2018.  [place] 23/30 in March 2018, 17/30 in April 2019. He stopped driving a year ago when he would get lost or go past their house. He started going to the [place] [facility] in October 2019. He refused to do [facility] testing but was noted to have cognitive deficits in multiple cognitive domains, with affective and behavioral components of delusions, visual hallucinations, irritability. No gait or sleep component. He had side effects on Donepezil and would miss doses of Rivastigmine. He was switched to the transdermal patch 4.6mg  daily. He had a paraneoplastic panel done which was negative. His last visit at [facility] was 2 months ago, he has seen Psychiatry and done well with addition of Lexapro for anxiety. It was noted that dose of rivastigmine was not increased due to syncopal episode. Diagnosis of late onset dementia,probable Alzheimer's disease, cognitive deficits are moderate to severe.   Family initially felt that the rivastigmine  patch was "overacting," so they tried administering the patch every other day. They noticed worsening and put him back on daily patches. He remembers things better and helps around the house. He can be stubborn and very demanding, wanting to get dressed and shaved early in the morning. Wife reports he is very concerned and worried, he keeps  repeating shaving because it is not as good as he wants. He For the past few months, he has been rummaging a lot in the house. He does not realized ownership of things and would pick up things that are not his and start using it. He gets confused using his electric shaver. They report sleep and appetite are good. Family reports he craves sweets. Family denies any hallucinations since starting the patches. He does not read as much. They deny any paranoia, most of the time he is calm, once in a while he gets a burst and gets mad or upset. He was more emotional when they visited [place] 1.5 years ago, family has not noticed this recently but do wonder about depression.   He denies any headaches, dizziness, vision changes, dysarthria/dysphagia, neck/back pain, focal numbness/tingling/weakness, bowel/bladder dysfunction, anosmia, or tremors. He denies any falls then his wife reminds him she fell and he states he was carrying a lot of things then. He states he "remembers everything."   He had an MRI brain in 03/2018 which I personally reviewed showing moderate diffuse atrophy and mild chronic microvascular disease.   Current Outpatient Medications on File Prior to Visit  Medication Sig Dispense Refill   atropine 1 % ophthalmic solution Place 1 drop under the tongue 3 (three) times daily as needed (excessive secretion). 2 mL 0   escitalopram (LEXAPRO) 20 MG tablet Take 1 tablet (20 mg total) by mouth daily. 90 tablet 3   feeding supplement (ENSURE ENLIVE / ENSURE PLUS) LIQD Take 237 mLs by mouth 2 (two) times daily between meals. 10000 mL 0   Ketotifen Fumarate (REFRESH EYE ITCH RELIEF OP) Apply to eye.     losartan (COZAAR) 50 MG tablet Take 50 mg by mouth daily.     NON FORMULARY Take 5 mLs by mouth 2 (two) times daily. Medication CBD oil BID     rivastigmine (EXELON) 4.6 mg/24hr Place 1 patch (4.6 mg total) onto the skin daily. 30 patch 11   benzonatate (TESSALON) 100 MG capsule Take 1 capsule (100 mg  total) by mouth 3 (three) times daily as needed for cough. (Patient not taking: Reported on 05/18/2021) 20 capsule 0   bisacodyl (DULCOLAX) 10 MG suppository Place 1 suppository (10 mg total) rectally daily as needed for moderate constipation. 12 suppository 0   guaiFENesin-dextromethorphan (ROBITUSSIN DM) 100-10 MG/5ML syrup Take 5 mLs by mouth every 4 (four) hours as needed for cough. (Patient not taking: Reported on 05/18/2021) 118 mL 0   haloperidol (HALDOL) 2 MG tablet Take 1 tablet (2 mg total) by mouth every 4 (four) hours as needed for agitation (or delirium). (Patient not taking: Reported on 05/18/2021) 20 tablet 0   levalbuterol (XOPENEX) 0.63 MG/3ML nebulizer solution Take 3 mLs (0.63 mg total) by nebulization every 6 (six) hours. 300 mL 0   LORazepam (ATIVAN) 1 MG tablet Take 0.5-1 tablets (0.5-1 mg total) by mouth every 4 (four) hours as needed for anxiety. (Patient not taking: Reported on 05/18/2021) 20 tablet 0   Morphine Sulfate (MORPHINE CONCENTRATE) 10 MG/0.5ML SOLN concentrated solution Take 0.25 mLs (5 mg total) by mouth every 2 (two) hours as  needed for moderate pain, severe pain or shortness of breath (or dyspnea). (Patient not taking: Reported on 05/18/2021) 118 mL 0   OLANZapine (ZYPREXA) 2.5 MG tablet Take 1 tablet (2.5 mg total) by mouth at bedtime. 30 tablet 0   polyethylene glycol powder (GLYCOLAX/MIRALAX) 17 GM/SCOOP powder Take 17 g by mouth daily. (Patient not taking: Reported on 05/18/2021)     No current facility-administered medications on file prior to visit.     Observations/Objective:   Vitals:   05/18/21 1022  Weight: 160 lb (72.6 kg)  Height: 5\' 9"  (1.753 m)    GEN:  The patient appears stated age and is in NAD.  Neurological examination: Patient is awake, alert. No verbal output during today's visit. He does not follow commands. Wife repeatedly asked him to lift his arms. Extraocular movements intact with no nystagmus. No facial asymmetry. Motor: cannot assess as  patient is in wheelchair, and does not follow commands.  Assessment and Plan:   This is a 81 yo RH man with a history of hypertension, prostate cancer, with moderate to severe dementia, likely due to Alzheimer's disease. He is now in memory care requiring assistance with all ADLs. He does not follow commands and is non-verbal on today's visit   Continue Rivastigmine and Lexapro Continue 85-month follow-up or sooner if necessary.  Follow Up Instructions:  Time spent in visit 24 mins     -I discussed the assessment and treatment plan with the patient. The patient was provided an opportunity to ask questions and all were answered. The patient agreed with the plan and demonstrated an understanding of the instructions.   The patient was advised to call back or seek an in-person evaluation if the symptoms worsen or if the condition fails to improve as anticipated.    Sharene Butters, PA-C

## 2021-06-08 ENCOUNTER — Telehealth: Payer: Self-pay | Admitting: Family Medicine

## 2021-06-08 NOTE — Telephone Encounter (Signed)
Scheduled [facility] Palliative visit for 06-18-21 at 1:30.

## 2021-06-12 ENCOUNTER — Telehealth: Payer: Self-pay | Admitting: Family Medicine

## 2021-06-12 NOTE — Telephone Encounter (Signed)
Daughter [name] called to reschedule [facility] Palliative visit for in person 06-28-21 at 11:00

## 2021-06-14 ENCOUNTER — Telehealth: Payer: Self-pay

## 2021-06-17 ENCOUNTER — Telehealth: Payer: Self-pay

## 2021-06-17 NOTE — Telephone Encounter (Signed)
Received message that daughter concerned that DME will stay in place since patient's discharge from Hospice services. Phone call placed to [facility] to inquire if they had everything they needed to have billing changed and DME to remain in patient's home. Transferred to Ivin Booty, message left. FU e-mail also sent.  ?

## 2021-06-17 NOTE — Telephone Encounter (Signed)
Reached out to [facility], Hospice RN re: DME. Ria Comment shared that Dr. Francesco Sor notified and requested to have script sent for DME. ?

## 2021-06-18 ENCOUNTER — Telehealth: Payer: Medicare Other | Admitting: Family Medicine

## 2021-06-25 ENCOUNTER — Other Ambulatory Visit: Payer: Self-pay

## 2021-06-25 ENCOUNTER — Other Ambulatory Visit: Payer: Medicare Other

## 2021-06-25 DIAGNOSIS — Z515 Encounter for palliative care: Secondary | ICD-10-CM

## 2021-06-25 NOTE — Progress Notes (Signed)
COMMUNITY PALLIATIVE CARE SW NOTE ? ?PATIENT NAME: [name] ?DOB: April 20, 1940 ?MRN: [id number] ? ?PRIMARY CARE PROVIDER: Sueanne Margarita, DO ? ?RESPONSIBLE PARTY:  ?Acct ID - Guarantor Home Phone Work Phone Relationship Acct Type  ?[id number] Seth [phone] Self P/F  ?   [place], [place], [place] [address]  ? ?Due to the COVID-19 crisis, this virtual check-in visit was done via telephone from my office and it was initiated and consent by this patient and or family. ? ?SW completed a telephonic visit with patient's wife. She advised that patient was discharged from hospice. Patient has DME that still need to picked up and she was worried that they will be billed. SW advised that the patient/family should not be responsible for the bill. His wife advised that patient has received a new bed that will be billed through medicare and they have a small co-pay. SW advised that she will discuss with the RN and request that equipment be picked up. Patietn's wife also requested assistance with a respite placement for patient. SW advised patient that the family will be responsible for patient. She verbalized understanding. SW advised her that she will begin to search for respite care.  ?No other concerns noted. ?*palliative RN notified for follow-up. ? ?Katheren Puller, LCSW ? ?

## 2021-06-28 ENCOUNTER — Other Ambulatory Visit: Payer: Medicare Other | Admitting: Family Medicine

## 2021-07-10 ENCOUNTER — Other Ambulatory Visit: Payer: Self-pay

## 2021-07-10 ENCOUNTER — Encounter: Payer: Self-pay | Admitting: Family Medicine

## 2021-07-10 ENCOUNTER — Other Ambulatory Visit: Payer: Medicare Other | Admitting: Family Medicine

## 2021-07-10 VITALS — BP 118/84 | HR 75 | Temp 97.3°F | Resp 14

## 2021-07-10 DIAGNOSIS — F03B18 Unspecified dementia, moderate, with other behavioral disturbance: Secondary | ICD-10-CM

## 2021-07-10 DIAGNOSIS — L821 Other seborrheic keratosis: Secondary | ICD-10-CM | POA: Insufficient documentation

## 2021-07-10 DIAGNOSIS — Z515 Encounter for palliative care: Secondary | ICD-10-CM

## 2021-07-10 NOTE — Progress Notes (Signed)
? ? ?Manufacturing engineer ?Community Palliative Care Consult Note ?Telephone: [phone]  ?Fax: [phone]  ? ? ?Date of encounter: 07/10/21 ?11:19 PM ?PATIENT NAME: [name] ?[address] ?[place] [address]   ?[phone] (home) [phone] (work) ?DOB: January 07, 1941 ?MRN: [id number] ?PRIMARY CARE PROVIDER:    ?Louis Margarita, DO,  ?[address] ?[place] [address] ?[phone] ? ?REFERRING PROVIDER:   ?Louis Margarita, DO ?[address] ?[place],  [place] [address] ?[phone] ? ?RESPONSIBLE PARTY:    ?Contact Information   ? ? Name Relation Home Work Mobile  ? [name], [name] Spouse [phone]  [phone]  ? [name], [name] Daughter [phone]  [phone]  ? ?  ? ? ? ?I met face to face with patient and family in his home. Palliative Care was asked to follow this patient by consultation request of  Louis Margarita, DO to address advance care planning and complex medical decision making following his discharge from Hospice. This is a follow up visit. ? ?                                 ASSESSMENT, SYMPTOM MANAGEMENT AND PLAN / RECOMMENDATIONS:  ? Palliative Care Encounter ?D/c'd from Hospice.  Admitted to Palliative Care ?Continue to follow closely, at risk for deterioration ?Daughter requesting in-home respite with male care provider. ?SW has previously given Futures trader for paid caregivers in home but will speak with pt's family again. ? ?2.  Moderate Dementia with behavioral disturbance ?Continue Exelon and Lexapro. ?Try redirection and then medications-Ativan and Haldol. ? ?3.  Seborrheic keratosis ?Has lesion on left lower inner leg ?Monitor for changes in growth, color, healing. ?Would recommend conservative approach as pt unlikely to heal well with altered protein intake. ? ? ? ?Advance Care Planning/Goals of Care: Goals include to maximize quality of life and symptom management.  ?CODE STATUS: ?MOST as of 02/08/20: ?DNR with full scope of medical intervention ?Antibiotics if  indicated ?IV fluids and feeding tube for a defined trial period ? ? ? ?Follow up Palliative Care Visit: Palliative care will continue to follow for complex medical decision making, advance care planning, and clarification of goals. Return 4-6 weeks or prn. ? ? ?This visit was coded based on medical decision making (MDM). ? ?PPS: 40% ? ?HOSPICE ELIGIBILITY/DIAGNOSIS: TBD ? ?Chief Complaint:  ?Palliative Care is taking over patient's care as he has been discharged from Hospice due to significant improvement for medical management of his dementia. ? ?HISTORY OF PRESENT ILLNESS:  [name] is a 81 y.o. year old male with moderate dementia, HTN, hx of TIA, CAD. MVP with severe MR s/p MV repair, nonrheumatic aortic valve insufficiency, asthma, presbylarynx, GERD, osteoporosis, hx of subungual malignant melanoma and prostate cancer, constipation.  He has a paid male caregiver to help about 4 hours per day with bathing, dressing and feeding patient.  Daughter [name] was present for visit and advised of concern for lesion on left lower inner leg.  Caregiver states after hospitalization 4 months ago for sepsis that pt stopped eating and drinking, was not getting OOB.  He states he has been working with patient gradually and that he is doing well with being fed, that he is max assist for ambulation but he does have him up and walking. Family and caregiver denies fall.  Pt drowsy and falling back to sleep unless continuously being stimulated.  He does not make eye contact and does not respond. Given hx  of melanoma daughter is concerned about lesion on inner medial left lower leg and how she would get it treated if he did have cancer how they would get someone to treat him at home. Advised that lesion consistent with either actinic or seborrheic keratosis. ? ?History obtained from review of EMR, discussion with family, paid staff and/or [name].  ?I reviewed available labs, medications, imaging, studies and related  documents from the EMR.  Records reviewed and summarized above.  ? ?ROS ? ?Given advanced dementia and lack of verbal response am unable to get hx ? ?Physical Exam: ?Current and past weights: 160 lbs as of 05/18/21 ?Constitutional: NAD ?General: frail and chronically ill appearing ?EYES: anicteric sclera, lids intact, no discharge. Small hemorrhage noted center and to left of iris in left eye ?ENMT: does not open mouth ?CV: S1S2, RRR with holosystolic Grade 3 murmur, no LE edema ?Pulmonary: CTAB, no increased work of breathing, no cough, room air ?Abdomen: normo-active BS + 4 quadrants, soft and non tender, no ascites ?GU: deferred ?MSK: noted sarcopenia of BLE, moves all extremities ?Skin: warm and dry, dark/elevated rough plaque like hyperpigmented lesion roughly just under a quarter size ?Neuro:  noted generalized weakness,   ?Psych: non-anxious affect, drowsy, arousable, non-verbal ?Hem/lymph/immuno: no widespread bruising ? ? ?Thank you for the opportunity to participate in the care of [name].  The palliative care team will continue to follow. Please call our office at [phone] if we can be of additional assistance.  ? ?Louis Conception, FNP-C ? ?COVID-19 PATIENT SCREENING TOOL ?Asked and negative response unless otherwise noted:  ? ?Have you had symptoms of covid, tested positive or been in contact with someone with symptoms/positive test in the past 5-10 days?  No ?

## 2021-08-21 ENCOUNTER — Other Ambulatory Visit: Payer: Medicare Other

## 2021-08-21 DIAGNOSIS — Z515 Encounter for palliative care: Secondary | ICD-10-CM

## 2021-08-30 ENCOUNTER — Other Ambulatory Visit: Payer: Medicare Other | Admitting: Family Medicine

## 2021-08-30 ENCOUNTER — Encounter: Payer: Self-pay | Admitting: Family Medicine

## 2021-08-30 VITALS — BP 120/68 | HR 79 | Resp 20

## 2021-08-30 DIAGNOSIS — Z515 Encounter for palliative care: Secondary | ICD-10-CM

## 2021-08-30 DIAGNOSIS — F03B18 Unspecified dementia, moderate, with other behavioral disturbance: Secondary | ICD-10-CM

## 2021-08-30 NOTE — Progress Notes (Signed)
Designer, [facility]ellery Palliative Care Consult Note Telephone: [phone]  Fax: [phone]    Date of encounter: 08/30/21 11:52 AM PATIENT NAME: [name] [address] [place] [address]   [phone] (home) [phone] (work) DOB: 1940-10-07 MRN: [id number] PRIMARY CARE PROVIDER:    Lorn [name]  [name] [address] [phone]  REFERRING PROVIDER:   Sueanne [name], [place] [address] [place],  [place] [address] [phone]  RESPONSIBLE PARTY:    Contact Information     Name Relation Home Work Mobile   [name] Spouse [phone]  [phone]   [name], [name] [phone]  [phone]        I met face to face with patient, family and paid caregiver in his home. Palliative Care was asked to follow this patient by consultation request of  Louis Margarita, DO to address advance care planning and complex medical decision making following his discharge from Hospice. This is a follow up visit.                                   ASSESSMENT, SYMPTOM MANAGEMENT AND PLAN / RECOMMENDATIONS:   Palliative Care Encounter Stable and doing well currently Walking with paid caregiver  2.  Moderate Dementia with behavioral disturbance Stable and easily redirected at present Continue small, frequent meals     Advance Care Planning/Goals of Care: Goals include to maximize quality of life and symptom management.  CODE STATUS: MOST as of 02/08/20: DNR with full scope of medical intervention Antibiotics if indicated IV fluids and feeding tube for a defined trial period    Follow up Palliative Care Visit: Palliative care will continue to follow for complex medical decision making, advance care planning, and clarification of goals. Return 4-6 weeks or prn.   This visit was coded based on medical decision making (MDM).  PPS: 40%  HOSPICE ELIGIBILITY/DIAGNOSIS: TBD  Chief Complaint:  Palliative Care is  following for medical management of his dementia.  HISTORY OF PRESENT ILLNESS:  [name] is a 81 y.o. year old male with moderate dementia, HTN, hx of TIA, CAD. MVP with severe MR s/p MV repair, nonrheumatic aortic valve insufficiency, asthma, presbylarynx, GERD, osteoporosis, hx of subungual malignant melanoma and prostate cancer, constipation.  He has a paid male caregiver to help about 4 hours per day with bathing, dressing and feeding patient.  Requests wc. He has been walking with his caregiver, appetite is good and he is sleeping well.  Family wants in home male caregiver for the evening shift to help with toileting and getting patient ready for bed.  History obtained from review of EMR, discussion with family, paid staff and/or [name].  I reviewed available labs, medications, imaging, studies and related documents from the EMR.  Records reviewed and summarized above.   ROS  Given advanced dementia and lack of verbal response am unable to get hx  Physical Exam: Current and past weights: 160 lbs as of 05/18/21 Constitutional: NAD General: WN, WD EYES: anicteric sclera, lids intact, no discharge.  ENMT: does not open mouth CV: S1S2, RRR with LUSB murmur, no LE edema Pulmonary: CTAB, no increased work of breathing, no cough, room air Abdomen: normo-active BS + 4 quadrants, soft and non tender, no ascites GU: deferred MSK: noted sarcopenia of BLE, moves all extremities Skin: warm and dry. Neuro:  improving strength,   Psych: non-anxious affect, smiles, makes eye contact,  verbalizes a few words per day Hem/lymph/immuno: no widespread bruising   Thank you for the opportunity to participate in the care of [name].  The palliative care team will continue to follow. Please call our office at [phone] if we can be of additional assistance.   Louis Conception, FNP-C  COVID-19 PATIENT SCREENING TOOL Asked and negative response unless otherwise noted:   Have you  had symptoms of covid, tested positive or been in contact with someone with symptoms/positive test in the past 5-10 days?  No

## 2021-08-31 ENCOUNTER — Encounter: Payer: Self-pay | Admitting: Neurology

## 2021-08-31 ENCOUNTER — Telehealth (INDEPENDENT_AMBULATORY_CARE_PROVIDER_SITE_OTHER): Payer: Medicare Other | Admitting: Neurology

## 2021-08-31 VITALS — Ht 68.0 in | Wt 160.0 lb

## 2021-08-31 DIAGNOSIS — F03C18 Unspecified dementia, severe, with other behavioral disturbance: Secondary | ICD-10-CM

## 2021-08-31 MED ORDER — RIVASTIGMINE 4.6 MG/24HR TD PT24: 4.6000 mg | MEDICATED_PATCH | Freq: Every day | TRANSDERMAL | 11 refills | Status: DC

## 2021-08-31 MED ORDER — ESCITALOPRAM OXALATE 20 MG PO TABS: 20.0000 mg | ORAL_TABLET | Freq: Every day | ORAL | 3 refills | Status: DC

## 2021-08-31 NOTE — Progress Notes (Signed)
Virtual Visit via Video Note The purpose of this virtual visit is to provide medical care while limiting exposure to the novel coronavirus.    Consent was obtained for video visit:  Yes.   Answered questions that patient had about telehealth interaction:  Yes.   I discussed the limitations, risks, security and privacy concerns of performing an evaluation and management service by telemedicine. I also discussed with the patient that there may be a patient responsible charge related to this service. The patient expressed understanding and agreed to proceed.  Pt location: Home Physician Location: office Name of referring provider:  Sueanne Margarita, DO I connected with [name] at patient's family's initiation/request on 08/31/2021 at  3:00 PM EDT by video enabled telemedicine application and verified that I am speaking with the correct person using two identifiers. Pt MRN:  [id number] Pt DOB:  1940-11-07 Video Participants:  [name];  [name] (spouse); [name] (daughter)   History of Present Illness:  The patient had a virtual video visit on 09/03/2021. He was last seen in our office 3 months ago by Memory Disorders PA Sharene Butters. Since his last visit, family decided to bring him back home, he did not do well at [name]. He is doing much better in his home environment with male caregivers. He uses a wheelchair but family and caregivers assist him with walking. He likes to play with his granddaughter and responds to her, more than before. Sleep is good. No behavioral issues. He is able to pick up apple slices but needs assistance with eating otherwise. Pills are crushed, he chews the gummies with no swallowing difficulties. They have a very good routine at home. He is on Rivastigmine 4.'6mg'$ /24 hr patch daily, with noticeable difference when off the patch. He is also on Lexapro '20mg'$  daily and takes melatonin '3mg'$  qhs at night.    History on Initial Assessment  11/23/2018: This is a 81 year old left-handed man with a history of hypertension, prostate cancer, presenting to establish care for dementia. Family reports memory changes started 3-4 years ago where he would not remember names. Later on he did not recognize friends. His wife reports taking over finances in 2019, it appears he still did the taxes in 2019 (despite being diagnosed with dementia in 2018). He has been evaluated by neurologist Dr. Brett Fairy in 2018.  [place] 23/30 in March 2018, 17/30 in April 2019. He stopped driving a year ago when he would get lost or go past their house. He started going to the [facility] clinic in October 2019. He refused to do [facility] testing but was noted to have cognitive deficits in multiple cognitive domains, with affective and behavioral components of delusions, visual hallucinations, irritability. No gait or sleep component. He had side effects on Donepezil and would miss doses of Rivastigmine. He was switched to the transdermal patch 4.'6mg'$  daily. He had a paraneoplastic panel done which was negative. His last visit at [facility] was 2 months ago, he has seen Psychiatry and done well with addition of Lexapro for anxiety. It was noted that dose of rivastigmine was not increased due to syncopal episode. Diagnosis of late onset dementia,probable Alzheimer's disease, cognitive deficits are moderate to severe.   Family initially felt that the rivastigmine patch was "overacting," so they tried administering the patch every other day. They noticed worsening and put him back on daily patches. He remembers things better and helps around the house. He can be stubborn and very  demanding, wanting to get dressed and shaved early in the morning. Wife reports he is very concerned and worried, he keeps repeating shaving because it is not as good as he wants. He For the past few months, he has been rummaging a lot in the house. He does not realized ownership of things and would pick up things  that are not his and start using it. He gets confused using his electric shaver. They report sleep and appetite are good. Family reports he craves sweets. Family denies any hallucinations since starting the patches. He does not read as much. They deny any paranoia, most of the time he is calm, once in a while he gets a burst and gets mad or upset. He was more emotional when they visited [place] 1.5 years ago, family has not noticed this recently but do wonder about depression.   He denies any headaches, dizziness, vision changes, dysarthria/dysphagia, neck/back pain, focal numbness/tingling/weakness, bowel/bladder dysfunction, anosmia, or tremors. He denies any falls then his wife reminds him she fell and he states he was carrying a lot of things then. He states he "remembers everything."   He had an MRI brain in 03/2018 which I personally reviewed showing moderate diffuse atrophy and mild chronic microvascular disease.  Outpatient Encounter Medications as of 08/31/2021  Medication Sig   Ascorbic Acid (VITAMIN C) 500 MG CAPS as directed Orally   Aspirin 81 MG CAPS Aspirin 81 mg   b complex vitamins capsule Take 1 capsule by mouth daily.   Cholecalciferol 25 MCG (1000 UT) CHEW 1 tablet Orally Once a day   feeding supplement (ENSURE ENLIVE / ENSURE PLUS) LIQD Take 237 mLs by mouth 2 (two) times daily between meals.   Ketotifen Fumarate (REFRESH EYE ITCH RELIEF OP) Apply to eye.   losartan (COZAAR) 50 MG tablet Take 50 mg by mouth daily.   melatonin 3 MG TABS tablet Take 3 mg by mouth at bedtime.   Multiple Vitamin (MULTI-VITAMIN DAILY PO) Take 1 tablet by mouth.   NON FORMULARY Take 5 mLs by mouth 2 (two) times daily. Medication CBD oil BID           escitalopram (LEXAPRO) 20 MG tablet Take 1 tablet (20 mg total) by mouth daily.   rivastigmine (EXELON) 4.6 mg/24hr Place 1 patch (4.6 mg total) onto the skin daily.   No facility-administered encounter medications on file as of 08/31/2021.      Observations/Objective:   Vitals:   08/31/21 1238  Weight: 160 lb (72.6 kg)  Height: '5\' 8"'$  (1.727 m)   GEN:  The patient appears stated age and is in NAD.  Neurological examination: Patient is awake, alert, lying on bed. He focuses on family but is non-verbal and does not follow instructions. Cranial nerves: Extraocular movements intact. No facial asymmetry. Motor: moves all extremities symmetrically, at least anti-gravity x 4.    Assessment and Plan:   This is an 81 yo RH man with a history of hypertension, prostate cancer, with moderate to severe dementia, likely due to Alzheimer's disease. He is doing better back at home with good routine and food he likes. Continue 24/7 care. Refills sent for Rivastigmine patch and Lexapro. Discussed family concerns regarding changing environment when family travels, they will try their best to keep him at home with round the clock caregivers. Follow-up with Sharene Butters in 3 months, call for any changes.    Follow Up Instructions:   -I discussed the assessment and treatment plan with the patient's family.  The patient's family was provided an opportunity to ask questions and all were answered. The patient's family agreed with the plan and demonstrated an understanding of the instructions.   The patient's family was advised to call back or seek an in-person evaluation if the symptoms worsen or if the condition fails to improve as anticipated.   Cameron Sprang, MD

## 2021-09-03 DIAGNOSIS — Z515 Encounter for palliative care: Secondary | ICD-10-CM | POA: Insufficient documentation

## 2021-09-03 NOTE — Patient Instructions (Signed)
Always good to see you. Continue all medications, continue 24/7 care. Follow-up with Clarise Cruz in 3 months and with me in 6 months. Call for any changes.

## 2021-09-06 ENCOUNTER — Telehealth: Payer: Self-pay

## 2021-09-06 NOTE — Telephone Encounter (Signed)
(  4:03 pm) PC SW left a message for patient's wife-[name] explaining that palliative care does not have used DME. SW advised that she could check with the hospice store to see if there was a wheelchair there that could be purchased. SW extended ongoing support and advised to call back with any additional questions.

## 2021-09-14 NOTE — Progress Notes (Signed)
COMMUNITY PALLIATIVE CARE SW NOTE  PATIENT NAME: [name] DOB: 1941/01/25 MRN: [id number]  PRIMARY CARE PROVIDER: Sueanne Margarita, DO  RESPONSIBLE PARTY:  Acct ID - Guarantor Home Phone Work Phone Relationship Acct Type  [id number] Seth [phone] Self P/F     [address], [place], [place] [address]   SOCIAL WORK TELEPHONIC ENCOUNTER  PC SW completed a telephonic encounter with patient's daughter-Reena.  Reena advised that patient was discharged from hospice on 06/20/21. She expressed interest in possibly placing patient in a facility. SW provided education regarding levels of care (SNF vs. AL). She advised that they are looking for a male caregiver for patient. SW advised that she was unaware of any private caregivers, but provided a list of agencies that she could contact. She also requested that assistance with obtaining a donated wheelchair. SW explained that palliative care do not manage DME and that all donated DME is taken to the hospice store. SW explained to her that she could call the store or go by there to see if there is a donated wheelchair  available for purchase.  SW extended support to Reena and encouraged her to call back with additional questions/concerns.   [address] [place], [place]

## 2021-10-03 ENCOUNTER — Other Ambulatory Visit: Payer: Medicare Other | Admitting: Family Medicine

## 2021-10-04 ENCOUNTER — Other Ambulatory Visit: Payer: Medicare Other | Admitting: Family Medicine

## 2021-10-04 ENCOUNTER — Encounter: Payer: Self-pay | Admitting: Family Medicine

## 2021-10-04 VITALS — BP 102/56 | HR 74 | Resp 18

## 2021-10-04 DIAGNOSIS — J4530 Mild persistent asthma, uncomplicated: Secondary | ICD-10-CM

## 2021-10-04 DIAGNOSIS — F03B18 Unspecified dementia, moderate, with other behavioral disturbance: Secondary | ICD-10-CM

## 2021-10-04 DIAGNOSIS — Z636 Dependent relative needing care at home: Secondary | ICD-10-CM | POA: Insufficient documentation

## 2021-12-07 ENCOUNTER — Telehealth: Payer: Medicare Other | Admitting: Physician Assistant

## 2021-12-13 ENCOUNTER — Telehealth (INDEPENDENT_AMBULATORY_CARE_PROVIDER_SITE_OTHER): Payer: Medicare Other | Admitting: Physician Assistant

## 2021-12-13 DIAGNOSIS — F03C18 Unspecified dementia, severe, with other behavioral disturbance: Secondary | ICD-10-CM

## 2021-12-13 MED ORDER — ESCITALOPRAM OXALATE 20 MG PO TABS: 20.0000 mg | ORAL_TABLET | Freq: Every day | ORAL | 3 refills | Status: DC

## 2021-12-13 MED ORDER — RIVASTIGMINE 4.6 MG/24HR TD PT24: 4.6000 mg | MEDICATED_PATCH | Freq: Every day | TRANSDERMAL | 11 refills | Status: DC

## 2021-12-13 NOTE — Progress Notes (Signed)
Virtual Visit via Video Note The purpose of this virtual visit is to provide medical care in a patient that is unable to be seen in person due to physical or health limitations   Consent was obtained for video visit:  yes  Answered questions that patient had about telehealth interaction:  yes I discussed the limitations, risks, security and privacy concerns of performing an evaluation and management service by telemedicine. I also discussed with the patient that there may be a patient responsible charge related to this service. The patient expressed understanding and agreed to proceed.  Pt location: Home Physician Location: office Name of referring provider:  Sueanne Margarita, DO I connected with [name] at patients initiation/request on 12/13/2021 at  3:00 PM EDT by video enabled telemedicine application and verified that I am speaking with the correct person using two identifiers. Pt MRN:  [id number] Pt DOB:  February 03, 1941 Video Participants:  [name];  [name] ( daughter)   Assessment and plan  [name]  is a 81 y.o.  left-handed man with a history of hypertension, prostate cancer, with moderate to severe dementia, likely due to Alzheimer's disease.  He is at home, with a good routine, care by caregivers 24/7.  Family is in the process of obtaining even more care for him for comfort.  They are also entertaining the possibility of [facility] memory care for him.  He is on rivastigmine patch and Lexapro, tolerating well.  No new issues are present at this time. Follow-up in 3 months with Dr. Delice Lesch.  Family prefers to alternate` appointments between Dr. Delice Lesch 1 time, and then myself the following time    History of Present Illness:    The patient had a virtual video visit on 12/13/21  He was last seen in our office 3 months ago.   Patient is now under palliative care, at home, as he did not do well in [name].  He is doing well with his family male caregivers.   He is in a wheelchair with the caregivers assisting with walking.  He lives with his granddaughter and responds to her more than usual.  Sleep is good with no behavioral issues.  He is able to tolerate a variety of foods including pizza, but needs assistance with eating otherwise.  Pills are crushed, he chews the gummies without difficulties.  They have a very good routine at home, with caregivers from 9 to 1 PM, and then 4 to 7 PM, they are also looking into having extra help in the morning and in the evening.  They are also entertaining the possibility of placing him in [facility] memory care, given that his wife has mental health issues, that requires assistance as well, and it might become overwhelming at some point.Marland Kitchen  He is on rivastigmine 4.6 mg / 24-hour patch daily, where he is a noticeable difference when he is off the patch.  He is also on Lexapro 20 mg daily and takes melatonin 3 mg nightly at night.  Sometimes uses a recliner to sleep, and he is pretty comfortable.  No urine complaints.  If he does not have a bowel movement in 2 days, he would be taking an agent to help with evacuation. Patient was under hospice, but according to the family he was dropped off of it.  Family is in the process of communicating with them, for further details, to clarify this issue.   History on Initial Assessment 11/23/2018: This is a 81 year old  left-handed man with a history of hypertension, prostate cancer, presenting to establish care for dementia. Family reports memory changes started 3-4 years ago where he would not remember names. Later on he did not recognize friends. His wife reports taking over finances in 2019, it appears he still did the taxes in 2019 (despite being diagnosed with dementia in 2018). He has been evaluated by neurologist Dr. Brett Fairy in 2018.  [place] 23/30 in March 2018, 17/30 in April 2019. He stopped driving a year ago when he would get lost or go past their house. He started going to the [facility] in October 2019. He refused to do [facility] testing but was noted to have cognitive deficits in multiple cognitive domains, with affective and behavioral components of delusions, visual hallucinations, irritability. No gait or sleep component. He had side effects on Donepezil and would miss doses of Rivastigmine. He was switched to the transdermal patch 4.'6mg'$  daily. He had a paraneoplastic panel done which was negative. His [facility] was 2 months ago, he has seen Psychiatry and done well with addition of Lexapro for anxiety. It was noted that dose of rivastigmine was not increased due to syncopal episode. Diagnosis of late onset dementia,probable Alzheimer's disease, cognitive deficits are moderate to severe.    Family initially felt that the rivastigmine patch was "overacting," so they tried administering the patch every other day. They noticed worsening and put him back on daily patches. He remembers things better and helps around the house. He can be stubborn and very demanding, wanting to get dressed and shaved early in the morning. Wife reports he is very concerned and worried, he keeps repeating shaving because it is not as good as he wants. He For the past few months, he has been rummaging a lot in the house. He does not realized ownership of things and would pick up things that are not his and start using it. He gets confused using his electric shaver. They report sleep and appetite are good. Family reports he craves sweets. Family denies any hallucinations since starting the patches. He does not read as much. They deny any paranoia, most of the time he is calm, once in a while he gets a burst and gets mad or upset. He was more emotional when they visited [place] 1.5 years ago, family has not noticed this recently but do wonder about depression.    He denies any headaches, dizziness, vision changes, dysarthria/dysphagia, neck/back pain, focal numbness/tingling/weakness,  bowel/bladder dysfunction, anosmia, or tremors. He denies any falls then his wife reminds him she fell and he states he was carrying a lot of things then. He states he "remembers everything."    He had an MRI brain in 03/2018 which I personally reviewed showing moderate diffuse atrophy and mild chronic microvascular disease.     Current Outpatient Medications on File Prior to Visit  Medication Sig Dispense Refill   Ascorbic Acid (VITAMIN C) 500 MG CAPS as directed Orally     Aspirin 81 MG CAPS Aspirin 81 mg     b complex vitamins capsule Take 1 capsule by mouth daily.     Cholecalciferol 25 MCG (1000 UT) CHEW 1 tablet Orally Once a day     escitalopram (LEXAPRO) 20 MG tablet Take 1 tablet (20 mg total) by mouth daily. 90 tablet 3   feeding supplement (ENSURE ENLIVE / ENSURE PLUS) LIQD Take 237 mLs by mouth 2 (two) times daily between meals. 10000 mL 0  Ketotifen Fumarate (REFRESH EYE ITCH RELIEF OP) Apply to eye.     losartan (COZAAR) 50 MG tablet Take 50 mg by mouth daily.     melatonin 3 MG TABS tablet Take 3 mg by mouth at bedtime.     Multiple Vitamin (MULTI-VITAMIN DAILY PO) Take 1 tablet by mouth.     NON FORMULARY Take 5 mLs by mouth 2 (two) times daily. Medication CBD oil BID     rivastigmine (EXELON) 4.6 mg/24hr Place 1 patch (4.6 mg total) onto the skin daily. 30 patch 11   No current facility-administered medications on file prior to visit.     Observations/Objective:   There were no vitals filed for this visit. GEN:  The patient appears stated age and is in NAD.  Neurological examination: Patient is awake, alert, lying on his bed.  He focuses on family but is nonverbal and does not follow instructions.  Cranial nerves extraocular movements intact.  No facial asymmetry.  Motor: Moves all extremities symmetrically, at least 3 gravity x4.        Follow Up Instructions:    -I discussed the assessment and treatment plan with the patient. The patient family was provided an  opportunity to ask questions and all were answered. The patient agreed with the plan and demonstrated an understanding of the instructions.   The patient was advised to call back or seek an in-person evaluation if the symptoms worsen or if the condition fails to improve as anticipated.    Total time spent on today's visit was 30.02 minutes, including both face-to-face time and nonface-to-face time.  Time included that spent on review of records (prior notes available to me/labs/imaging if pertinent), discussing treatment and goals, answering patient's questions and coordinating care.   Sharene Butters, PA-C

## 2021-12-13 NOTE — Patient Instructions (Signed)
Always good to see you. Continue all medications, continue 24/7 care. Follow-up with Dr. Delice Lesch 3 months and with me in 6 months. Call for any changes.

## 2022-01-03 ENCOUNTER — Encounter: Payer: Self-pay | Admitting: Family Medicine

## 2022-01-03 ENCOUNTER — Other Ambulatory Visit: Payer: Medicare Other | Admitting: Family Medicine

## 2022-01-03 VITALS — BP 112/68 | HR 71 | Resp 16

## 2022-01-03 DIAGNOSIS — F03B18 Unspecified dementia, moderate, with other behavioral disturbance: Secondary | ICD-10-CM

## 2022-01-03 DIAGNOSIS — J4530 Mild persistent asthma, uncomplicated: Secondary | ICD-10-CM

## 2022-01-03 NOTE — Progress Notes (Signed)
[facility] Consult Note Telephone: [phone]  Fax: [phone]    Date of encounter: 01/03/22 11:58 AM PATIENT NAME: [name] [address] [place] [address]   [phone] (home) [phone] (work) DOB: [birth year] MRN: [id number] PRIMARY CARE PROVIDER:    Lorn Junes  [place] [address] [place] PROVIDER:   Sueanne Margarita, [place],  [place] [address] [phone]  RESPONSIBLE PARTY:    Contact Information     Name Relation Home Work Mobile   [place] Spouse [phone]  [phone]   Kesean, Serviss [phone]  [phone]        I met face to face with patient, wife and daughter in his home. Palliative Care was asked to follow this patient by consultation request of  Sueanne Margarita, DO to address advance care planning and complex medical decision making following his discharge from Hospice. This is a follow up visit.                                   ASSESSMENT, SYMPTOM MANAGEMENT AND PLAN / RECOMMENDATIONS:   1.  Moderate Dementia with behavioral disturbance Stable, eating well and no problematic behaviors Continues on Rivastigmine and Lexapro. FAST 7 score 7b  2.   Mild persistent asthma Stable with no evidence of exacerbation today. Treating with daily Albuterol neb Not currently on maintenance meds    Advance Care Planning/Goals of Care: Goals include to maximize quality of life and symptom management.  CODE STATUS: MOST as of 02/08/20: DNR with full scope of medical intervention Antibiotics if indicated IV fluids and feeding tube for a defined trial period    Follow up Palliative Care Visit: Palliative care will continue to follow for complex medical decision making, advance care planning, and clarification of goals. Return 4 weeks or prn.   This visit was coded based on medical decision making (MDM).  PPS:  50%  HOSPICE ELIGIBILITY/DIAGNOSIS: TBD  Chief Complaint:  Palliative Care is following for chronic medical management in setting of dementia.  HISTORY OF PRESENT ILLNESS:  [name] is a 81 y.o. year old male with moderate dementia, HTN, hx of TIA, CAD. MVP with severe MR s/p MV repair, nonrheumatic aortic valve insufficiency, asthma, presbylarynx, GERD, osteoporosis, hx of subungual malignant melanoma and prostate cancer, constipation.    He has had no falls, constipation.  He is eating well.  He has to have assistance with bathing and dressing.  He is ambulating part of the time and using wc part of the time. Has home health PT through [facility]. He has had some recent wheezing and no cough. He continues to get Ensure Promax.  History obtained from review of EMR, discussion with family and/or Mr. Bossler.  I reviewed available labs, medications, imaging, studies and related documents from the EMR with nothing new since last Palliative visit.   ROS  Given advanced dementia and lack of verbal response am unable to get hx  Physical Exam: Current and past weights: 160 lbs as of 08/31/21 Constitutional: NAD General: WN, WD CV: S1S2, RRR  more audible at RLSB, no LE edema Pulmonary: CTAB, no increased work of breathing, no cough, room air Abdomen: normo-active BS + 4 quadrants, soft and non tender MSK: increasing muscle mass of BLE, moves all extremities, ambulatory (working with PT on arrival) Skin: warm and dry. Neuro:  improving strength,  A & O to self, largely non-verbal Psych: non-anxious affect,  makes eye contact Hem/lymph/immuno: no widespread bruising   Thank you for the opportunity to participate in the care of [name].  The palliative care team will continue to follow. Please call our office at [phone] if we can be of additional assistance.   Marijo Conception, FNP-C  COVID-19 PATIENT SCREENING TOOL Asked and negative response unless otherwise noted:    Have you had symptoms of covid, tested positive or been in contact with someone with symptoms/positive test in the past 5-10 days?  No

## 2022-01-31 ENCOUNTER — Encounter: Payer: Self-pay | Admitting: Family Medicine

## 2022-01-31 ENCOUNTER — Other Ambulatory Visit: Payer: Medicare Other | Admitting: Family Medicine

## 2022-01-31 VITALS — BP 94/58 | HR 80 | Resp 20

## 2022-01-31 DIAGNOSIS — J4541 Moderate persistent asthma with (acute) exacerbation: Secondary | ICD-10-CM

## 2022-01-31 NOTE — Progress Notes (Signed)
[facility] Consult Note Telephone: [phone]  Fax: [phone]    Date of encounter: 01/31/22 12:18 PM PATIENT NAME: [name] [address] [place] [address]   [phone] (home) [phone] (work) DOB: November 30, 1940 MRN: [id number] PRIMARY CARE PROVIDER:    Lorn Junes  [place] [address] [place] PROVIDER:   Sueanne Margarita, [place],   [address] [phone]  RESPONSIBLE PARTY:    Contact Information     Name Relation Home Work Mobile   [place] Spouse [phone]  [phone]   Jessey, Stehlin [phone]  [phone]        I met face to face with patient, wife and daughter in his home. Palliative Care was asked to follow this patient by consultation request of  Sueanne Margarita, DO to address advance care planning and complex medical decision making following his discharge from Hospice. This is an acute care visit.                                   ASSESSMENT, SYMPTOM MANAGEMENT AND PLAN / RECOMMENDATIONS:   1.   Moderate persistent asthma with acute exacerbation/likely pneumonia Start Levofloxacin solution 500 mg daily x 5 days.  Continue Mucinex with increased fluid intake. Albuterol nebs Q 6 hrs prn SOB, wheezing. Can give Tylenol 650 mg Q 6 hours prn fever/body aches. If increased dyspnea or continued fever, worsening intake, notify PCP and [facility] NP.     Advance Care Planning/Goals of Care: Goals include to maximize quality of life and symptom management.  CODE STATUS: MOST as of 02/08/20: DNR with full scope of medical intervention Antibiotics if indicated IV fluids and feeding tube for a defined trial period    Follow up Palliative Care Visit: Palliative care will continue to follow for complex medical decision making, advance care planning, and clarification of goals. Return 2 weeks or prn.   This visit  was coded based on medical decision making (MDM).  PPS: 50%  HOSPICE ELIGIBILITY/DIAGNOSIS: TBD  Chief Complaint:  Family requested acute visit for cough and congestion symptoms with home Covid test negative.  HISTORY OF PRESENT ILLNESS:  [name] is a 81 y.o. year old male with moderate dementia, HTN, hx of TIA, CAD. MVP with severe MR s/p MV repair, nonrheumatic aortic valve insufficiency, asthma, presbylarynx, GERD, osteoporosis, hx of subungual malignant melanoma and prostate cancer, constipation.   He is eating well.  He has a productive cough but can't expectorate.  He had temp last night 100.4 and sweats.  He is getting Mucinex po and Albuterol nebs.  He has PCN allergy with breathing problems. Family is unaware if he has had Cephalosporins in the past but he does better with liquid formulations currently due to congestion. Family has tested him twice with home Covid tests, both of which were negative.  History obtained from review of EMR, discussion with family and/or Mr. Lottman.   I reviewed available labs, medications, imaging, studies and related documents from the EMR with nothing new since last Palliative visit.   ROS  Given advanced dementia and lack of verbal response am unable to get hx  Physical Exam: Current and past weights: 160 lbs as of 08/31/21 Constitutional: NAD General: WN, WD CV: S1S2, RRR  more audible  murmur at RLSB, no LE edema Pulmonary: Bibasilar crackles with LLL expiratory wheeze, no increased work  of breathing, cough is mildly productive but pt is unable to expectorate, room air Abdomen: normo-active BS + 4 quadrants, soft and non tender MSK: sitting up in chair on arrival Skin: warm and dry. Neuro:  Lethargic, A & O to self, non-verbal Psych: non-anxious affect,  makes eye contact Hem/lymph/immuno: no widespread bruising   Thank you for the opportunity to participate in the care of Mr. Winger.  The palliative care team will continue to  follow. Please call our office at [phone] if we can be of additional assistance.   Marijo Conception, FNP-C  COVID-19 PATIENT SCREENING TOOL Asked and negative response unless otherwise noted:   Have you had symptoms of covid, tested positive or been in contact with someone with symptoms/positive test in the past 5-10 days?  No

## 2022-02-01 ENCOUNTER — Other Ambulatory Visit: Payer: Self-pay

## 2022-02-01 ENCOUNTER — Inpatient Hospital Stay (HOSPITAL_COMMUNITY): Payer: Medicare Other

## 2022-02-01 ENCOUNTER — Encounter (HOSPITAL_COMMUNITY): Payer: Self-pay | Admitting: Emergency Medicine

## 2022-02-01 ENCOUNTER — Encounter: Payer: Self-pay | Admitting: Family Medicine

## 2022-02-01 ENCOUNTER — Emergency Department (HOSPITAL_COMMUNITY): Payer: Medicare Other

## 2022-02-01 ENCOUNTER — Other Ambulatory Visit: Payer: Medicare Other | Admitting: Family Medicine

## 2022-02-01 ENCOUNTER — Inpatient Hospital Stay (HOSPITAL_COMMUNITY)
Admission: EM | Admit: 2022-02-01 | Discharge: 2022-02-03 | DRG: 871 | Disposition: A | Discharge status: Home Health | Payer: Medicare Other | Attending: Family Medicine | Admitting: Family Medicine

## 2022-02-01 ENCOUNTER — Telehealth: Payer: Self-pay | Admitting: Family Medicine

## 2022-02-01 VITALS — BP 148/88 | HR 114 | Resp 35

## 2022-02-01 DIAGNOSIS — R6520 Severe sepsis without septic shock: Secondary | ICD-10-CM | POA: Diagnosis present

## 2022-02-01 DIAGNOSIS — Z66 Do not resuscitate: Secondary | ICD-10-CM | POA: Diagnosis present

## 2022-02-01 DIAGNOSIS — F03B18 Unspecified dementia, moderate, with other behavioral disturbance: Secondary | ICD-10-CM | POA: Diagnosis present

## 2022-02-01 DIAGNOSIS — R0609 Other forms of dyspnea: Secondary | ICD-10-CM | POA: Diagnosis not present

## 2022-02-01 DIAGNOSIS — A419 Sepsis, unspecified organism: Principal | ICD-10-CM | POA: Diagnosis present

## 2022-02-01 DIAGNOSIS — E785 Hyperlipidemia, unspecified: Secondary | ICD-10-CM | POA: Diagnosis present

## 2022-02-01 DIAGNOSIS — K219 Gastro-esophageal reflux disease without esophagitis: Secondary | ICD-10-CM | POA: Diagnosis present

## 2022-02-01 DIAGNOSIS — Z85820 Personal history of malignant melanoma of skin: Secondary | ICD-10-CM | POA: Diagnosis not present

## 2022-02-01 DIAGNOSIS — Z7982 Long term (current) use of aspirin: Secondary | ICD-10-CM

## 2022-02-01 DIAGNOSIS — I10 Essential (primary) hypertension: Secondary | ICD-10-CM | POA: Diagnosis present

## 2022-02-01 DIAGNOSIS — Z955 Presence of coronary angioplasty implant and graft: Secondary | ICD-10-CM | POA: Diagnosis not present

## 2022-02-01 DIAGNOSIS — G9341 Metabolic encephalopathy: Secondary | ICD-10-CM | POA: Diagnosis present

## 2022-02-01 DIAGNOSIS — J9601 Acute respiratory failure with hypoxia: Secondary | ICD-10-CM | POA: Diagnosis present

## 2022-02-01 DIAGNOSIS — J986 Disorders of diaphragm: Secondary | ICD-10-CM | POA: Diagnosis present

## 2022-02-01 DIAGNOSIS — Z8546 Personal history of malignant neoplasm of prostate: Secondary | ICD-10-CM

## 2022-02-01 DIAGNOSIS — J45909 Unspecified asthma, uncomplicated: Secondary | ICD-10-CM

## 2022-02-01 DIAGNOSIS — M810 Age-related osteoporosis without current pathological fracture: Secondary | ICD-10-CM | POA: Diagnosis present

## 2022-02-01 DIAGNOSIS — I679 Cerebrovascular disease, unspecified: Secondary | ICD-10-CM | POA: Diagnosis present

## 2022-02-01 DIAGNOSIS — Z8673 Personal history of transient ischemic attack (TIA), and cerebral infarction without residual deficits: Secondary | ICD-10-CM

## 2022-02-01 DIAGNOSIS — Z954 Presence of other heart-valve replacement: Secondary | ICD-10-CM | POA: Diagnosis not present

## 2022-02-01 DIAGNOSIS — Z8679 Personal history of other diseases of the circulatory system: Secondary | ICD-10-CM

## 2022-02-01 DIAGNOSIS — Z880 Allergy status to penicillin: Secondary | ICD-10-CM

## 2022-02-01 DIAGNOSIS — B9789 Other viral agents as the cause of diseases classified elsewhere: Secondary | ICD-10-CM | POA: Diagnosis present

## 2022-02-01 DIAGNOSIS — J690 Pneumonitis due to inhalation of food and vomit: Secondary | ICD-10-CM | POA: Diagnosis not present

## 2022-02-01 DIAGNOSIS — Z515 Encounter for palliative care: Secondary | ICD-10-CM

## 2022-02-01 DIAGNOSIS — Z8249 Family history of ischemic heart disease and other diseases of the circulatory system: Secondary | ICD-10-CM

## 2022-02-01 DIAGNOSIS — Z888 Allergy status to other drugs, medicaments and biological substances status: Secondary | ICD-10-CM

## 2022-02-01 DIAGNOSIS — J45901 Unspecified asthma with (acute) exacerbation: Secondary | ICD-10-CM | POA: Diagnosis present

## 2022-02-01 DIAGNOSIS — I2510 Atherosclerotic heart disease of native coronary artery without angina pectoris: Secondary | ICD-10-CM | POA: Diagnosis present

## 2022-02-01 DIAGNOSIS — Z79899 Other long term (current) drug therapy: Secondary | ICD-10-CM

## 2022-02-01 DIAGNOSIS — Z87440 Personal history of urinary (tract) infections: Secondary | ICD-10-CM

## 2022-02-01 DIAGNOSIS — Z1152 Encounter for screening for COVID-19: Secondary | ICD-10-CM

## 2022-02-01 LAB — RESPIRATORY PANEL BY PCR

## 2022-02-01 LAB — RESP PANEL BY RT-PCR (FLU A&B, COVID) ARPGX2
Influenza A by PCR: NEGATIVE
Influenza B by PCR: NEGATIVE
SARS Coronavirus 2 by RT PCR: NEGATIVE

## 2022-02-01 LAB — CBC WITH DIFFERENTIAL/PLATELET
Abs Immature Granulocytes: 0.05 10*3/uL (ref 0.00–0.07)
Basophils Absolute: 0 10*3/uL (ref 0.0–0.1)
Basophils Relative: 0 %
Eosinophils Absolute: 0 10*3/uL (ref 0.0–0.5)
Eosinophils Relative: 0 %
HCT: 42.1 % (ref 39.0–52.0)
Hemoglobin: 13.9 g/dL (ref 13.0–17.0)
Immature Granulocytes: 0 %
Lymphocytes Relative: 6 %
Lymphs Abs: 0.8 10*3/uL (ref 0.7–4.0)
MCH: 32.1 pg (ref 26.0–34.0)
MCHC: 33 g/dL (ref 30.0–36.0)
MCV: 97.2 fL (ref 80.0–100.0)
Monocytes Absolute: 1.9 10*3/uL — ABNORMAL HIGH (ref 0.1–1.0)
Monocytes Relative: 15 %
Neutro Abs: 9.7 10*3/uL — ABNORMAL HIGH (ref 1.7–7.7)
Neutrophils Relative %: 79 %
Platelets: 183 10*3/uL (ref 150–400)
RBC: 4.33 MIL/uL (ref 4.22–5.81)
RDW: 13.2 % (ref 11.5–15.5)
WBC: 12.5 10*3/uL — ABNORMAL HIGH (ref 4.0–10.5)
nRBC: 0 % (ref 0.0–0.2)

## 2022-02-01 LAB — BRAIN NATRIURETIC PEPTIDE: B Natriuretic Peptide: 659 pg/mL — ABNORMAL HIGH (ref 0.0–100.0)

## 2022-02-01 LAB — COMPREHENSIVE METABOLIC PANEL
ALT: 35 U/L (ref 0–44)
AST: 57 U/L — ABNORMAL HIGH (ref 15–41)
Albumin: 3.6 g/dL (ref 3.5–5.0)
Alkaline Phosphatase: 66 U/L (ref 38–126)
Anion gap: 7 (ref 5–15)
BUN: 23 mg/dL (ref 8–23)
CO2: 27 mmol/L (ref 22–32)
Calcium: 8.5 mg/dL — ABNORMAL LOW (ref 8.9–10.3)
Chloride: 102 mmol/L (ref 98–111)
Creatinine, Ser: 1.05 mg/dL (ref 0.61–1.24)
GFR, Estimated: >60 mL/min (ref >60)
Glucose, Bld: 145 mg/dL — ABNORMAL HIGH (ref 70–99)
Potassium: 4.6 mmol/L (ref 3.5–5.1)
Sodium: 136 mmol/L (ref 135–145)
Total Bilirubin: 0.8 mg/dL (ref 0.3–1.2)
Total Protein: 6.9 g/dL (ref 6.5–8.1)

## 2022-02-01 LAB — LACTIC ACID, PLASMA
Lactic Acid, Venous: 2.1 mmol/L (ref 0.5–1.9)
Lactic Acid, Venous: 1.7 mmol/L (ref 0.5–1.9)

## 2022-02-01 LAB — APTT: aPTT: 30 seconds (ref 24–36)

## 2022-02-01 LAB — PROTIME-INR
INR: 1.2 (ref 0.8–1.2)
Prothrombin Time: 14.8 seconds (ref 11.4–15.2)

## 2022-02-01 LAB — PROCALCITONIN: Procalcitonin: 0.54 ng/mL

## 2022-02-01 LAB — MAGNESIUM: Magnesium: 2 mg/dL (ref 1.7–2.4)

## 2022-02-01 LAB — PHOSPHORUS: Phosphorus: 2 mg/dL — ABNORMAL LOW (ref 2.5–4.6)

## 2022-02-01 MED ORDER — ACETAMINOPHEN 325 MG PO TABS
650.0000 mg | ORAL_TABLET | Freq: Once | ORAL | Status: DC
  Filled 2022-02-01: qty 2

## 2022-02-01 MED ORDER — IPRATROPIUM-ALBUTEROL 0.5-2.5 (3) MG/3ML IN SOLN
3.0000 mL | Freq: Four times a day (QID) | RESPIRATORY_TRACT | Status: DC
  Administered 2022-02-01 – 2022-02-02 (×3): 3 mL via RESPIRATORY_TRACT
  Filled 2022-02-01 (×3): qty 3

## 2022-02-01 MED ORDER — POTASSIUM PHOSPHATES 15 MMOLE/5ML IV SOLN
15.0000 mmol | Freq: Once | INTRAVENOUS | Status: AC
  Administered 2022-02-01: 15 mmol via INTRAVENOUS
  Filled 2022-02-01: qty 5

## 2022-02-01 MED ORDER — ACETAMINOPHEN 650 MG RE SUPP
650.0000 mg | Freq: Four times a day (QID) | RECTAL | Status: DC | PRN
  Filled 2022-02-01: qty 1

## 2022-02-01 MED ORDER — ALBUTEROL SULFATE (2.5 MG/3ML) 0.083% IN NEBU: 2.5000 mg | INHALATION_SOLUTION | Freq: Four times a day (QID) | RESPIRATORY_TRACT | Status: DC | PRN

## 2022-02-01 MED ORDER — MAGNESIUM SULFATE 2 GM/50ML IV SOLN
2.0000 g | Freq: Once | INTRAVENOUS | Status: AC
  Administered 2022-02-01: 2 g via INTRAVENOUS
  Filled 2022-02-01: qty 50

## 2022-02-01 MED ORDER — ACETAMINOPHEN 325 MG PO TABS: 650.0000 mg | ORAL_TABLET | Freq: Four times a day (QID) | ORAL | Status: DC | PRN

## 2022-02-01 MED ORDER — ALBUTEROL SULFATE (2.5 MG/3ML) 0.083% IN NEBU
2.5000 mg | INHALATION_SOLUTION | Freq: Once | RESPIRATORY_TRACT | Status: AC
  Administered 2022-02-01: 2.5 mg via RESPIRATORY_TRACT
  Filled 2022-02-01: qty 3

## 2022-02-01 MED ORDER — ACETAMINOPHEN 650 MG RE SUPP
650.0000 mg | Freq: Once | RECTAL | Status: AC
  Administered 2022-02-01: 650 mg via RECTAL
  Filled 2022-02-01: qty 1

## 2022-02-01 MED ORDER — IPRATROPIUM-ALBUTEROL 0.5-2.5 (3) MG/3ML IN SOLN
3.0000 mL | Freq: Once | RESPIRATORY_TRACT | Status: AC
  Administered 2022-02-01: 3 mL via RESPIRATORY_TRACT
  Filled 2022-02-01: qty 3

## 2022-02-01 MED ORDER — ONDANSETRON HCL 4 MG PO TABS: 4.0000 mg | ORAL_TABLET | Freq: Four times a day (QID) | ORAL | Status: DC | PRN

## 2022-02-01 MED ORDER — ONDANSETRON HCL 4 MG/2ML IJ SOLN: 4.0000 mg | Freq: Four times a day (QID) | INTRAMUSCULAR | Status: DC | PRN

## 2022-02-01 MED ORDER — PANTOPRAZOLE SODIUM 40 MG IV SOLR
40.0000 mg | Freq: Every day | INTRAVENOUS | Status: DC
  Administered 2022-02-01: 40 mg via INTRAVENOUS
  Filled 2022-02-01: qty 10

## 2022-02-01 MED ORDER — SODIUM CHLORIDE 0.9 % IV SOLN
2.0000 g | INTRAVENOUS | Status: DC
  Administered 2022-02-01 – 2022-02-02 (×2): 2 g via INTRAVENOUS
  Filled 2022-02-01 (×2): qty 20

## 2022-02-01 MED ORDER — IOHEXOL 300 MG/ML  SOLN
100.0000 mL | Freq: Once | INTRAMUSCULAR | Status: AC | PRN
  Administered 2022-02-01: 100 mL via INTRAVENOUS

## 2022-02-01 MED ORDER — ENOXAPARIN SODIUM 40 MG/0.4ML IJ SOSY
40.0000 mg | PREFILLED_SYRINGE | INTRAMUSCULAR | Status: DC
  Administered 2022-02-01: 40 mg via SUBCUTANEOUS
  Filled 2022-02-01: qty 0.4

## 2022-02-01 MED ORDER — METHYLPREDNISOLONE SODIUM SUCC 125 MG IJ SOLR
125.0000 mg | Freq: Once | INTRAMUSCULAR | Status: AC
  Administered 2022-02-01: 125 mg via INTRAVENOUS
  Filled 2022-02-01: qty 2

## 2022-02-01 MED ORDER — SODIUM CHLORIDE 0.9 % IV BOLUS
1000.0000 mL | Freq: Once | INTRAVENOUS | Status: AC
  Administered 2022-02-01: 1000 mL via INTRAVENOUS

## 2022-02-01 MED ORDER — SODIUM CHLORIDE 0.9 % IV SOLN
500.0000 mg | INTRAVENOUS | Status: DC
  Administered 2022-02-01 – 2022-02-02 (×2): 500 mg via INTRAVENOUS
  Filled 2022-02-01 (×2): qty 5

## 2022-02-01 NOTE — ED Provider Notes (Signed)
[facility] DEPT Provider Note   CSN: [id number] Arrival date & time: 02/01/22  1410     History {Add pertinent medical, surgical, social history, OB history to HPI:1} Chief Complaint  Patient presents with   Shortness of Breath    [name] is a 81 y.o. male.  Patient has a history of dementia along with coronary artery disease.  He has had fevers chills and cough   Shortness of Breath      Home Medications Prior to Admission medications   Medication Sig Start Date End Date Taking? Authorizing Provider  Ascorbic Acid (VITAMIN C) 500 MG CAPS as directed Orally    [provider]  Aspirin 81 MG CAPS Aspirin 81 mg    [provider]  b complex vitamins capsule Take 1 capsule by mouth daily.    [provider]  Cholecalciferol 25 MCG (1000 UT) CHEW 1 tablet Orally Once a day    [provider]  escitalopram (LEXAPRO) 20 MG tablet Take 1 tablet (20 mg total) by mouth daily. 12/13/21   Rondel Jumbo, PA-C  feeding supplement (ENSURE ENLIVE / ENSURE PLUS) LIQD Take 237 mLs by mouth 2 (two) times daily between meals. 03/20/21   Lavina Hamman, MD  Ketotifen Fumarate (REFRESH EYE [place] OP) Apply to eye.    [provider]  losartan (COZAAR) 50 MG tablet Take 50 mg by mouth daily.    [provider]  melatonin 3 MG TABS tablet Take 3 mg by mouth at bedtime.    [provider]  Multiple Vitamin (MULTI-VITAMIN DAILY PO) Take 1 tablet by mouth.    [provider]  NON FORMULARY Take 5 mLs by mouth 2 (two) times daily. Medication CBD oil BID    [provider]  rivastigmine (EXELON) 4.6 mg/24hr Place 1 patch (4.6 mg total) onto the skin daily. 12/13/21   Rondel Jumbo, PA-C      Allergies    Aricept [donepezil], Ativan [lorazepam], Depakote [divalproex sodium], and Penicillins    Review of Systems   Review of Systems  Respiratory:  Positive for shortness  of breath.     Physical Exam Updated Vital Signs BP 117/78   Pulse 62   Temp 100.3 F (37.9 C) (Rectal)   Resp (!) 36   Ht '5\' 8"'$  (1.727 m)   Wt 72 kg   SpO2 96%   BMI 24.14 kg/m  Physical Exam  ED Results / Procedures / Treatments   Labs (all labs ordered are listed, but only abnormal results are displayed) Labs Reviewed  LACTIC ACID, PLASMA - Abnormal; Notable for the following components:      Result Value   Lactic Acid, Venous 2.1 (*)    All other components within normal limits  COMPREHENSIVE METABOLIC PANEL - Abnormal; Notable for the following components:   Glucose, Bld 145 (*)    Calcium 8.5 (*)    AST 57 (*)    All other components within normal limits  CBC WITH DIFFERENTIAL/PLATELET - Abnormal; Notable for the following components:   WBC 12.5 (*)    Neutro Abs 9.7 (*)    Monocytes Absolute 1.9 (*)    All other components within normal limits  BRAIN NATRIURETIC PEPTIDE - Abnormal; Notable for the following components:   B Natriuretic Peptide 659.0 (*)    All other components within normal limits  RESP PANEL BY RT-PCR (FLU A&B, COVID) ARPGX2  CULTURE, BLOOD (ROUTINE X 2)  CULTURE, BLOOD (ROUTINE X 2)  URINE CULTURE  PROTIME-INR  APTT  LACTIC ACID, PLASMA  URINALYSIS, ROUTINE W REFLEX MICROSCOPIC    EKG None  Radiology DG Chest Port 1 View  Result Date: 02/01/2022 CLINICAL DATA:  Possible sepsis, shortness of breath EXAM: PORTABLE CHEST 1 VIEW COMPARISON:  Previous studies including the chest radiograph done on 03/14/2021 FINDINGS: Transverse diameter of heart is increased. There is possible prosthetic cardiac valve. Central pulmonary vessels are prominent. There is marked elevation of right hemidiaphragm with no significant change. There are no signs of alveolar pulmonary edema or focal pulmonary consolidation. There is no significant pleural effusion or pneumothorax. IMPRESSION: Cardiomegaly. There are no signs of pulmonary edema or new focal  infiltrates. There is marked elevation of right hemidiaphragm with no significant interval change. Electronically Signed   By: Elmer Picker M.D.   On: 02/01/2022 14:59    Procedures Procedures  {Document cardiac monitor, telemetry assessment procedure when appropriate:1}  Medications Ordered in ED Medications  cefTRIAXone (ROCEPHIN) 2 g in sodium chloride 0.9 % 100 mL IVPB (0 g Intravenous Stopped 02/01/22 1531)  azithromycin (ZITHROMAX) 500 mg in sodium chloride 0.9 % 250 mL IVPB (0 mg Intravenous Stopped 02/01/22 1656)  acetaminophen (TYLENOL) tablet 650 mg (650 mg Oral Not Given 02/01/22 1653)  sodium chloride 0.9 % bolus 1,000 mL (0 mLs Intravenous Stopped 02/01/22 1657)  ipratropium-albuterol (DUONEB) 0.5-2.5 (3) MG/3ML nebulizer solution 3 mL (3 mLs Nebulization Given 02/01/22 1522)  albuterol (PROVENTIL) (2.5 MG/3ML) 0.083% nebulizer solution 2.5 mg (2.5 mg Nebulization Given 02/01/22 1522)  acetaminophen (TYLENOL) suppository 650 mg (650 mg Rectal Given 02/01/22 1653)    ED Course/ Medical Decision Making/ A&P                           Medical Decision Making Amount and/or Complexity of Data Reviewed Labs: ordered. Radiology: ordered. ECG/medicine tests: ordered.  Risk OTC drugs. Prescription drug management. Decision regarding hospitalization.   Patient with fever and chills most likely related to pneumonia.  He will be admitted to medicine  {Document critical care time when appropriate:1} {Document review of labs and clinical decision tools ie heart score, Chads2Vasc2 etc:1}  {Document your independent review of radiology images, and any outside records:1} {Document your discussion with family members, caretakers, and with consultants:1} {Document social determinants of health affecting pt's care:1} {Document your decision making why or why not admission, treatments were needed:1} Final Clinical Impression(s) / ED Diagnoses Final diagnoses:  Acute sepsis  ([place])    Rx / DC Orders ED Discharge Orders     None

## 2022-02-01 NOTE — Progress Notes (Signed)
[facility] Palliative Care Consult Note Telephone: [phone]  Fax: [phone]    Date of encounter: 02/01/22 1:34 PM PATIENT NAME: [name] [address] [place] [address]   [phone] (home) [phone] (work) DOB: March 30, 1941 MRN: [id number] PRIMARY CARE PROVIDER:    Lorn Junes  [employer] [place] [address] [place] PROVIDER:   Sueanne Margarita, [place],  [place] [address] [phone]  RESPONSIBLE PARTY:    Contact Information     Name Relation Home Work Mobile   Pierson Spouse [phone]  [phone]   Bodey, Frizell [phone]  [phone]        I met face to face with patient, wife and daughter in his home. Palliative Care was asked to follow this patient by consultation request of  Sueanne Margarita, DO to address advance care planning and complex medical decision making following his discharge from Hospice. This is a follow up visit.                                   ASSESSMENT, SYMPTOM MANAGEMENT AND PLAN / RECOMMENDATIONS:   1.   Acute hypoxic respiratory failure Given Albuterol nebulizer at 10 am and 1 pm. Appears toxic Tachypneic, tachycardic, hypoxic Had 1 dose of Levaquin 500 mg po crushed last night, given liquid dose today. Covid negative  2.   Chronic asthma Expiratory wheezing-Albuterol neb, sent in by EMS      Advance Care Planning/Goals of Care: Goals include to maximize quality of life and symptom management.  CODE STATUS: MOST as of 02/08/20: DNR with full scope of medical intervention Antibiotics if indicated IV fluids and feeding tube for a defined trial period    Follow up Palliative Care Visit: Sent to ED.   This visit was coded based on medical decision making (MDM).  PPS: 50%  HOSPICE ELIGIBILITY/DIAGNOSIS: TBD  Chief Complaint:  Acute visit for breathing difficulty.  HISTORY OF PRESENT ILLNESS:  [name] is a 81 y.o. year old male with moderate dementia, HTN, hx of TIA, CAD. MVP with severe MR s/p MV repair, nonrheumatic aortic valve insufficiency, asthma, presbylarynx, GERD, osteoporosis, hx of subungual malignant melanoma and prostate cancer, constipation.  He was started last night on Crushed  Levofloxacin 500 mg.  He has been receiving Mucinex. He attempts to jerkily sit up and cough but cannot expectorate. Albuterol neb given at 10 am, gave repeat Albuterol neb at 1 pm.  After Albuterol neb his O2 sat improved to 94%, HR 100, RR 29.  Covid PCR negative today. Dr Jillyn Hidden and Dr Francesco Sor are aware. His Levofloxacin liquid 500 mg was given today.   History obtained from review of EMR, discussion with family and/or Mr. Goyal.  I reviewed available labs, medications, imaging, studies and related documents from the EMR with nothing new since last Palliative visit.   ROS  Given advanced dementia and lack of verbal response am unable to get hx  Physical Exam: Current and past weights: 160 lbs as of 08/31/21 Constitutional: Noted toxic in appearance and lethargic, intermittent rigors General: WN, WD CV: S1S2, RRR  more audible at RLSB, no LE edema Pulmonary: Productive cough but unable to expectorate, pursed lip breathing and use of accessory muscles. Coarse rales throughout and expiratory wheeze.  Occasionally tripods. Abdomen: normo-active BS + 4 quadrants, soft and non tender MSK: increasing muscle mass of BLE, moves  all extremities, ambulatory weak Skin: warm and dry. Neuro:  improving strength, A & O to self, largely non-verbal Psych: non-anxious affect,  makes eye contact Hem/lymph/immuno: no widespread bruising   Thank you for the opportunity to participate in the care of Mr. Goetting.  The palliative care team will continue to follow. Please call our office at [phone] if we can be of additional assistance.   Marijo Conception, FNP-C  COVID-19 PATIENT SCREENING  TOOL Asked and negative response unless otherwise noted:   Have you had symptoms of covid, tested positive or been in contact with someone with symptoms/positive test in the past 5-10 days?  No

## 2022-02-01 NOTE — Sepsis Progress Note (Signed)
eLink is following this Code Sepsis. °

## 2022-02-01 NOTE — Telephone Encounter (Signed)
Returned call to pt's daughter, [name] as preferred pharmacy yesterday did not have liquid Levofloxacin.  Reena asked that RX be called to [facility].  Prescription sent to [facility] and followed up with phone call this am to ensure they had liquid Levofloxacin which was available.  Daughter picked up tablet RX last night and planned to crush so he received a dose yesterday.  Advised that they do have the liquid formulation at [facility] and told her to continue prescription for full course.  She states that patient is having more difficulty breathing today.  Will attempt follow up visit today.   Reinforced instructions given yesterday that if pt is having increased work of breathing or unbreakable or persistent fever that he will likely have to go to the ER to have further work up and likely need IV antibiotics. Pt had negative Covid home test yesterday morning per family and had negative one the night prior. Damaris Hippo FNP-C

## 2022-02-01 NOTE — H&P (Addendum)
History and Physical    Patient: [name] MVE:[id number] DOB: 04-24-1940 DOA: 02/01/2022 DOS: the patient was seen and examined on 02/01/2022 PCP: Sueanne Margarita, DO  Patient coming from: Home  Chief Complaint:  Chief Complaint  Patient presents with   Shortness of Breath   HPI: [name] is a 81 y.o. male with medical history significant of asthma, CAD, prostate cancer, postop hypercapnia, GERD, melanoma, hypertension, mitral valve prolapse, osteoporosis, history of TIA, history UTI, history of severe sepsis due to rhinovirus infection who is coming to the emergency department with complaints of 2 days of fever, dyspnea, decreased oral intake, malaise and delirium.  He has a history of advanced dementia and is unable to provide information.  HPI is given by his wife.  He was seen early in the afternoon by his palliative care nurse who found him to be tachypneic and hypoxic.  The patient was subsequently brought to the hospital.  ED course: Initial vital signs were temperature 100.3 F, pulse 92, respirations 39, BP 133/100 mmHg O2 sat 96% on room air.  The patient received 1000 mL of normal saline bolus, DuoNeb, an albuterol neb, ceftriaxone/azithromycin IVPB and 650 mg of acetaminophen rectally.  Lab work: CBC is her white count 12.5, hemoglobin 13.9 g/dL and platelets 183.  Normal PT, INR and PTT.  Lactic acid 2.1 then 1.7 mmol/L.  CMP showed a glucose of 145, calcium of 8.5 mg deciliter and AST of 57 units/L.  The rest of the CMP measurements were normal.  Magnesium was 2.0 and phosphorus 2.0 mg/dL.  Procalcitonin 0.54 ng/mL.  Imaging: One-view portable chest radiograph showed cardiomegaly but no edema or infiltrate.   Review of Systems: As mentioned in the history of present illness. All other systems reviewed and are negative. Past Medical History:  Diagnosis Date   Asthma    CAD (coronary artery disease)    Cancer ([place]) 2016   prostate   Complication of  anesthesia    CO2 high according to patient    Family history of ovarian cancer    GERD (gastroesophageal reflux disease)    History of melanoma    History of prostate cancer    Hospice care patient 03/20/2021   HTN (hypertension)    Mitral valve prolapse    Osteoporosis    Severe sepsis ([place]) due to rhinovirus infection 03/13/2021   TIA (transient ischemic attack)    UTI (lower urinary tract infection)    Past Surgical History:  Procedure Laterality Date   APPENDECTOMY     CORONARY ANGIOPLASTY WITH STENT PLACEMENT  04/30/2010   PCI and stenting of proximal RCA   MITRAL VALVE REPAIR  06/13/2010   right mini thoracotomy for complex valvuloplasty with 84m Memo ring annuloplasty - Dr. ORoxy Manns  NAILBED REPAIR Right 05/02/2015   Procedure: BIOPSY NAIL BED RIGHT THUMB;  Surgeon: GDaryll Brod MD;  [facility]  Service: Orthopedics;  Laterality: Right;  ANESTHESIA:  IV REGIONAL FAB   PROSTATECTOMY  2016   Social History:  reports that he has never smoked. He has never used smokeless tobacco. He reports that he does not currently use alcohol. He reports that he does not use drugs.  Allergies  Allergen Reactions   Aricept [Donepezil]     Upset stomach   Ativan [Lorazepam]     aggigation   Depakote [Divalproex Sodium]     Made me feel like a different person   Penicillins Itching and Rash    Has  patient had a PCN reaction causing immediate rash, facial/tongue/throat swelling, SOB or lightheadedness with hypotension: YES Has patient had a PCN reaction causing severe rash involving mucus membranes or skin necrosis: NO Has patient had a PCN reaction that required hospitalization: NO Has patient had a PCN reaction occurring within the last 10 years: NO If all of the above answers are "NO", then may proceed with Cephalosporin use.    Family History  Problem Relation Age of Onset   Heart disease Father    Heart disease Mother    Cirrhosis Brother    Ulcers Brother     Ovarian cancer Other        d. 37    Prior to Admission medications   Medication Sig Start Date End Date Taking? Authorizing Provider  Ascorbic Acid (VITAMIN C) 500 MG CAPS as directed Orally    [provider]  Aspirin 81 MG CAPS Aspirin 81 mg    [provider]  b complex vitamins capsule Take 1 capsule by mouth daily.    [provider]  Cholecalciferol 25 MCG (1000 UT) CHEW 1 tablet Orally Once a day    [provider]  escitalopram (LEXAPRO) 20 MG tablet Take 1 tablet (20 mg total) by mouth daily. 12/13/21   Rondel Jumbo, PA-C  feeding supplement (ENSURE ENLIVE / ENSURE PLUS) LIQD Take 237 mLs by mouth 2 (two) times daily between meals. 03/20/21   Lavina Hamman, MD  Ketotifen Fumarate (REFRESH EYE [place] OP) Apply to eye.    [provider]  losartan (COZAAR) 50 MG tablet Take 50 mg by mouth daily.    [provider]  melatonin 3 MG TABS tablet Take 3 mg by mouth at bedtime.    [provider]  Multiple Vitamin (MULTI-VITAMIN DAILY PO) Take 1 tablet by mouth.    [provider]  NON FORMULARY Take 5 mLs by mouth 2 (two) times daily. Medication CBD oil BID    [provider]  rivastigmine (EXELON) 4.6 mg/24hr Place 1 patch (4.6 mg total) onto the skin daily. 12/13/21   Rondel Jumbo, PA-C    Physical Exam: Vitals:   02/01/22 1615 02/01/22 1630 02/01/22 1645 02/01/22 1700  BP: 113/76 105/74  117/78  Pulse: 94 100  62  Resp: (!) 32 (!) 30 17 (!) 36  Temp:      TempSrc:      SpO2: 97% 97%  96%  Weight:      Height:       Physical Exam Vitals and nursing note reviewed.  Constitutional:      General: He is awake. He is not in acute distress.    Appearance: He is well-developed and normal weight. He is ill-appearing.  HENT:     Head: Normocephalic.     Nose: No rhinorrhea.     Mouth/Throat:     Mouth: Mucous membranes are moist.  Eyes:     General: No scleral icterus.    Pupils:  Pupils are equal, round, and reactive to light.  Neck:     Vascular: No JVD.  Cardiovascular:     Rate and Rhythm: Normal rate and regular rhythm.     Heart sounds: S1 normal and S2 normal.  Pulmonary:     Effort: Tachypnea present.     Breath sounds: Examination of the right-lower field reveals decreased breath sounds. Decreased breath sounds, wheezing and rhonchi present. No rales.  Abdominal:     General: Bowel sounds  are normal.     Palpations: Abdomen is soft.     Tenderness: There is no abdominal tenderness.  Musculoskeletal:     Cervical back: Neck supple.     Right lower leg: No edema.     Left lower leg: No edema.  Skin:    General: Skin is warm and dry.  Neurological:     General: No focal deficit present.     Mental Status: He is alert. Mental status is at baseline. He is disoriented.  Psychiatric:        Mood and Affect: Mood is anxious.        Behavior: Behavior is cooperative.   Data Reviewed:  Results are pending, will review when available.  Assessment and Plan: Principal Problem:   Acute respiratory failure with hypoxia ([place]) Due to:   Asthma exacerbation Presenting with:   Sepsis due to undetermined organism [facility]) Suspect respiratory process. The patient also has history of:   Diaphragm paralysis Admit to PCU/inpatient. Continue supplemental oxygen. Scheduled and as needed bronchodilators. BiPAP as needed and nightly for now. Continue ceftriaxone 1 g IVPB daily. Continue azithromycin 500 mg IVPB daily. Check strep pneumoniae urinary antigen. Check sputum Gram stain, culture and sensitivity. Follow-up blood culture and sensitivity. Follow-up CBC and chemistry in the morning. Check CTA chest to rule out PE and better characterize. Check echocardiogram in the morning.  Active Problems:   HTN (hypertension) Monitor blood pressure. Resume losartan once tolerating oral intake. Parenteral antihypertensives as needed.    Gastroesophageal reflux  disease Pantoprazole for GI prophylaxis    CAD (coronary artery disease) Echocardiogram in AM.    Moderate dementia with behavioral disturbance ([place]) Supportive care. Reorient as needed.    HLD (hyperlipidemia) Currently not on therapy.    Hypophosphatemia Replacing.    Advance Care Planning:   Code Status: DNR   Consults:   Family Communication: His wife was at bedside and provided information.  Severity of Illness: The appropriate patient status for this patient is INPATIENT. Inpatient status is judged to be reasonable and necessary in order to provide the required intensity of service to ensure the patient's safety. The patient's presenting symptoms, physical exam findings, and initial radiographic and laboratory data in the context of their chronic comorbidities is felt to place them at high risk for further clinical deterioration. Furthermore, it is not anticipated that the patient will be medically stable for discharge from the hospital within 2 midnights of admission.   * I certify that at the point of admission it is my clinical judgment that the patient will require inpatient hospital care spanning beyond 2 midnights from the point of admission due to high intensity of service, high risk for further deterioration and high frequency of surveillance required.*  Author: Reubin Milan, MD 02/01/2022 5:22 PM  For on call review [email or web address].   This document was prepared using Dragon voice recognition software and may contain some unintended transcription errors.

## 2022-02-01 NOTE — ED Triage Notes (Signed)
BIBA Per EMS: Pt comes from home w/ c/o SHOB x a "few days." Pt resp 32. Pt nonverbal at baseline

## 2022-02-02 ENCOUNTER — Inpatient Hospital Stay (HOSPITAL_COMMUNITY): Payer: Medicare Other

## 2022-02-02 DIAGNOSIS — I679 Cerebrovascular disease, unspecified: Secondary | ICD-10-CM

## 2022-02-02 DIAGNOSIS — R0609 Other forms of dyspnea: Secondary | ICD-10-CM | POA: Diagnosis not present

## 2022-02-02 DIAGNOSIS — J690 Pneumonitis due to inhalation of food and vomit: Secondary | ICD-10-CM

## 2022-02-02 DIAGNOSIS — A419 Sepsis, unspecified organism: Secondary | ICD-10-CM | POA: Diagnosis not present

## 2022-02-02 LAB — CBC WITH DIFFERENTIAL/PLATELET
Abs Immature Granulocytes: 0.06 10*3/uL (ref 0.00–0.07)
Basophils Absolute: 0 10*3/uL (ref 0.0–0.1)
Basophils Relative: 0 %
Eosinophils Absolute: 0 10*3/uL (ref 0.0–0.5)
Eosinophils Relative: 0 %
HCT: 40 % (ref 39.0–52.0)
Hemoglobin: 13.1 g/dL (ref 13.0–17.0)
Immature Granulocytes: 1 %
Lymphocytes Relative: 6 %
Lymphs Abs: 0.4 10*3/uL — ABNORMAL LOW (ref 0.7–4.0)
MCH: 32.2 pg (ref 26.0–34.0)
MCHC: 32.8 g/dL (ref 30.0–36.0)
MCV: 98.3 fL (ref 80.0–100.0)
Monocytes Absolute: 0.4 10*3/uL (ref 0.1–1.0)
Monocytes Relative: 5 %
Neutro Abs: 6.4 10*3/uL (ref 1.7–7.7)
Neutrophils Relative %: 88 %
Platelets: 164 10*3/uL (ref 150–400)
RBC: 4.07 MIL/uL — ABNORMAL LOW (ref 4.22–5.81)
RDW: 13.2 % (ref 11.5–15.5)
WBC: 7.2 10*3/uL (ref 4.0–10.5)
nRBC: 0 % (ref 0.0–0.2)

## 2022-02-02 LAB — ECHOCARDIOGRAM COMPLETE
AV Mean grad: 41.1 mmHg
AV Peak grad: 32.8 mmHg
Ao pk vel: 2.87 m/s
Area-P 1/2: 2.8 cm2
Height: 68 in
MV M vel: 4.41 m/s
MV Peak grad: 77.8 mmHg
S' Lateral: 2.4 cm
Weight: 2539.7 oz

## 2022-02-02 LAB — URINALYSIS, ROUTINE W REFLEX MICROSCOPIC
Bilirubin Urine: NEGATIVE
Glucose, UA: NEGATIVE mg/dL
Hgb urine dipstick: NEGATIVE
Ketones, ur: NEGATIVE mg/dL
Leukocytes,Ua: NEGATIVE
Nitrite: NEGATIVE
Protein, ur: 30 mg/dL — AB
Specific Gravity, Urine: 1.02 (ref 1.005–1.030)
pH: 6.5 (ref 5.0–8.0)

## 2022-02-02 LAB — COMPREHENSIVE METABOLIC PANEL
ALT: 32 U/L (ref 0–44)
AST: 44 U/L — ABNORMAL HIGH (ref 15–41)
Albumin: 3.2 g/dL — ABNORMAL LOW (ref 3.5–5.0)
Alkaline Phosphatase: 63 U/L (ref 38–126)
Anion gap: 4 — ABNORMAL LOW (ref 5–15)
BUN: 20 mg/dL (ref 8–23)
CO2: 27 mmol/L (ref 22–32)
Calcium: 8.4 mg/dL — ABNORMAL LOW (ref 8.9–10.3)
Chloride: 107 mmol/L (ref 98–111)
Creatinine, Ser: 0.96 mg/dL (ref 0.61–1.24)
GFR, Estimated: >60 mL/min (ref >60)
Glucose, Bld: 155 mg/dL — ABNORMAL HIGH (ref 70–99)
Potassium: 5.2 mmol/L — ABNORMAL HIGH (ref 3.5–5.1)
Sodium: 138 mmol/L (ref 135–145)
Total Bilirubin: 0.6 mg/dL (ref 0.3–1.2)
Total Protein: 6.6 g/dL (ref 6.5–8.1)

## 2022-02-02 LAB — URINALYSIS, MICROSCOPIC (REFLEX)
Bacteria, UA: NONE SEEN
RBC / HPF: NONE SEEN RBC/hpf (ref 0–5)
Squamous Epithelial / HPF: NONE SEEN (ref 0–5)
WBC, UA: NONE SEEN WBC/hpf (ref 0–5)

## 2022-02-02 LAB — STREP PNEUMONIAE URINARY ANTIGEN: Strep Pneumo Urinary Antigen: NEGATIVE

## 2022-02-02 MED ORDER — IPRATROPIUM-ALBUTEROL 0.5-2.5 (3) MG/3ML IN SOLN
3.0000 mL | Freq: Three times a day (TID) | RESPIRATORY_TRACT | Status: DC
  Administered 2022-02-02: 3 mL via RESPIRATORY_TRACT
  Filled 2022-02-02: qty 3

## 2022-02-02 MED ORDER — CEFDINIR 300 MG PO CAPS: 300.0000 mg | ORAL_CAPSULE | Freq: Two times a day (BID) | ORAL | 0 refills | Status: DC

## 2022-02-02 MED ORDER — AZITHROMYCIN 250 MG PO TABS: ORAL_TABLET | ORAL | 0 refills | Status: AC

## 2022-02-02 MED ORDER — PREDNISONE 20 MG PO TABS: 40.0000 mg | ORAL_TABLET | Freq: Every day | ORAL | 0 refills | Status: DC

## 2022-02-02 MED ORDER — GUAIFENESIN-DM 100-10 MG/5ML PO SYRP
10.0000 mL | ORAL_SOLUTION | ORAL | Status: DC | PRN
  Filled 2022-02-02: qty 10

## 2022-02-02 MED ORDER — PERFLUTREN LIPID MICROSPHERE
1.0000 mL | INTRAVENOUS | Status: AC | PRN
  Administered 2022-02-02: 2 mL via INTRAVENOUS

## 2022-02-02 MED ORDER — ALBUTEROL SULFATE (2.5 MG/3ML) 0.083% IN NEBU: 2.5000 mg | INHALATION_SOLUTION | Freq: Four times a day (QID) | RESPIRATORY_TRACT | 12 refills | Status: DC | PRN

## 2022-02-02 MED ORDER — METHYLPREDNISOLONE SODIUM SUCC 40 MG IJ SOLR
40.0000 mg | Freq: Every day | INTRAMUSCULAR | Status: DC
  Administered 2022-02-02: 40 mg via INTRAVENOUS
  Filled 2022-02-02: qty 1

## 2022-02-02 MED ORDER — PREDNISONE 20 MG PO TABS
40.0000 mg | ORAL_TABLET | Freq: Every day | ORAL | Status: DC
  Filled 2022-02-02: qty 2

## 2022-02-02 NOTE — Progress Notes (Addendum)
SATURATION QUALIFICATIONS: (This note is used to comply with regulatory documentation for home oxygen)  Patient Saturations on Room Air at Rest = 97%  Patient Saturations on Room Air while Ambulating = 84%  Patient saturations on 2L O2 while ambulating= 91%   Please briefly explain why patient needs home oxygen: to maintain appropriate SpO2 levels with activity.   Blondell Reveal Kistler PT 02/02/2022  Acute Rehabilitation Services  Office [phone]

## 2022-02-02 NOTE — Care Management (Addendum)
Reviewed oxygen qualifiers, spoke to PT and Nsg via message. Called [facility] for oxygen  DME. 1620 called [facility] as he may be active with them for [facility]. They will call back from intake. It is noted looking through records that the patient was on hospice then discharged to palliative services. [facility] is seeing him outpatient.  1640 Katina from [employer] confirmed active status. Orders in system. [facility] will call the fmaily for oxygen set up at the home, in the meantime tanks will be provided to the patient( sent to hospital room) prior to DC.

## 2022-02-02 NOTE — ED Notes (Signed)
Pt at bedside, pt states that pt accidentally pulled out the iv from his R hand, dressing placed.

## 2022-02-02 NOTE — ED Notes (Signed)
Oxygen tanks have been delivered, pt awaits transport home.

## 2022-02-02 NOTE — ED Notes (Signed)
Transport called.

## 2022-02-02 NOTE — ED Notes (Signed)
D/c bipap, duo neb given, pt then placed on 2L via [place], pt remains on O2 sat monitor.

## 2022-02-02 NOTE — Progress Notes (Signed)
TOC CSW updated family in regards to pts oxygen being delivered to the room and additional tanks will be scheduled for delivery to home on a later date.  HH in the past was with [facility].  HH is currently being arranged.  Pt currently has a private caregiver in the home.   Sahira Cataldi Tarpley-Carter, MSW, LCSW-A Pronouns:  She/Her/Hers [facility] HealthTransitions of Care Clinical Social Worker Direct Number:  [phone] [email or web address]

## 2022-02-02 NOTE — Hospital Course (Signed)
[name] is an 81 y.o. M with dementia, CAD s/p PCI >5 years, hx MV repair, hx TIA, asthma, prosCA, HTN and subungual melanoma who presented with shortness of breath, wheezing, hypoxia for 2 days.  Family called out Palliative Care team who felt the patient appeared toxic and referred him to the ER.  In the ER, CTA chest ruled out PE but showed debris in the trachea and RLL consolidation.  He was febrile, hypoxic, tachypneic to the high 30s and required BiPAP.    10/20: Admitted on antibiotics for sepsis from aspiration pneumonia

## 2022-02-02 NOTE — Progress Notes (Signed)
TOC CSW has contacted the following [facility] agencies with no current response.  [facility] [phone]  [facility] [phone]  [facility] [phone]  [facility] [phone]  [facility] [place] [phone]  [facility] [phone]  CSW left HIPPA compliant message with my contact information.  Zacharias Ridling Tarpley-Carter, MSW, LCSW-A Pronouns:  She/Her/Hers [facility] HealthTransitions of Care Clinical Social Worker Direct Number:  [phone] [email or web address]

## 2022-02-02 NOTE — ED Notes (Addendum)
Pt completed assessment of pt, per pt pt desats when ambulating, plan to get home oxygen and then dc.  Family and caregiver at bedside.  Pt now on 2L O2 via [place], pt satting 98% on 2L, pt had bm, cleaned pt up with care giver assistance.

## 2022-02-02 NOTE — ED Notes (Addendum)
Pt in bed, family at bedside, family states that they would like to take pt home and care for him at home, states that last time he was admitted he got much weaker, states that they have a caregiver at home that can also help, md notified, D/c O2, md at bedside, pt remains on O2 sat monitor, family at bedside.

## 2022-02-02 NOTE — Progress Notes (Signed)
  Echocardiogram 2D Echocardiogram has been performed.  [name] 02/02/2022, 1:53 PM

## 2022-02-02 NOTE — ED Notes (Signed)
RT at bedside.

## 2022-02-02 NOTE — Evaluation (Signed)
Physical Therapy Evaluation Patient Details Name: [name] MRN: [id number] DOB: 11/21/1940 Today's Date: 02/02/2022  History of Present Illness  81 y.o. male with medical history significant of asthma, CAD, prostate cancer, postop hypercapnia, GERD, melanoma, hypertension, mitral valve prolapse, osteoporosis, history of TIA, history UTI, history of severe sepsis due to rhinovirus infection who is coming to the emergency department with complaints of 2 days of fever, dyspnea, decreased oral intake, malaise and delirium.  He has a history of advanced dementia and is unable to provide information.  HPI is given by his wife.  He was seen early in the afternoon by his palliative care nurse who found him to be tachypneic and hypoxic.  Clinical Impression  Pt's spouse, private caregiver Tresa Moore, and daughter were present for PT evaluation. At baseline, pt requires hand held assist for ambulation (without an assistive device), has not had falls in the past 6 months, requires assist for ADLs. Family reports pt had a significant decline during a hospitalization in November 2022 and they prefer to care for him at home. Pt's caregiver Tresa Moore safely demonstrated his technique for assisting pt with bed mobility, transfers, and ambulation. Pt ambulated 78' with hand held assist, SaO2 84% on room air, HR 135 walking, SaO2 97% on room air at rest. From a mobility standpoint, pt is safe to DC home.      Recommendations for follow up therapy are one component of a multi-disciplinary discharge planning process, led by the attending physician.  Recommendations may be updated based on patient status, additional functional criteria and insurance authorization.  Follow Up Recommendations No PT follow up      Assistance Recommended at Discharge Frequent or constant Supervision/Assistance  Patient can return home with the following  Assist for transportation;Help with stairs or ramp for entrance;Assistance with  cooking/housework;Direct supervision/assist for medications management;Direct supervision/assist for financial management;Assistance with feeding;A lot of help with bathing/dressing/bathroom;A lot of help with walking and/or transfers    Equipment Recommendations None recommended by PT  Recommendations for Other Services       Functional Status Assessment Patient has not had a recent decline in their functional status     Precautions / Restrictions Precautions Precautions: Fall Precaution Comments: family denies h/o falls in past 6 months Restrictions Weight Bearing Restrictions: No      Mobility  Bed Mobility Overal bed mobility: Needs Assistance Bed Mobility: Supine to Sit     Supine to sit: Max assist     General bed mobility comments: pt's CG Manny assisted pt supine to sit with hand held assist    Transfers Overall transfer level: Needs assistance Equipment used: 1 person hand held assist Transfers: Sit to/from Stand Sit to Stand: Max assist           General transfer comment: assist to rise and translate weight anteriorly 2* posterior lean    Ambulation/Gait Ambulation/Gait assistance: Mod assist Gait Distance (Feet): 90 Feet Assistive device: 1 person hand held assist Gait Pattern/deviations: Decreased stride length Gait velocity: decr     General Gait Details: Pt's private CG Manny demonstrated how they ambulate at home, no loss of balance. SaO2 84% on room air walking, 3/4 dyspnea, posterior lean with walking  Stairs            Wheelchair Mobility    Modified Rankin (Stroke Patients Only)       Balance Overall balance assessment: Needs assistance Sitting-balance support: Feet supported, Bilateral upper extremity supported Sitting balance-Leahy Scale: Zero Sitting balance -  Comments: heavy posterior push Postural control: Posterior lean Standing balance support: Bilateral upper extremity supported Standing balance-Leahy Scale:  Poor Standing balance comment: posterior lean                             Pertinent Vitals/Pain Pain Assessment Pain Assessment: Faces Faces Pain Scale: No hurt Breathing: normal Negative Vocalization: none Facial Expression: smiling or inexpressive Body Language: relaxed Consolability: no need to console PAINAD Score: 0    Home Living Family/patient expects to be discharged to:: Private residence Living Arrangements: Spouse/significant other Available Help at Discharge: Family;Available 24 hours/day;Personal care attendant Type of Home: House Home Access: Stairs to enter   [employer] of Steps: 2   Home Layout: One level Home Equipment: Wheelchair - manual      Prior Function Prior Level of Function : Needs assist       Physical Assist : Mobility (physical);ADLs (physical) Mobility (physical): Bed mobility;Transfers;Gait;Stairs ADLs (physical): Feeding;Bathing;Grooming;Dressing;Toileting;IADLs Mobility Comments: private aide at home, [place], assists pt with walking with hand held assist, no assistive device, hand held assist for steps ADLs Comments: assist for sponge baths     Hand Dominance        Extremity/Trunk Assessment   Upper Extremity Assessment Upper Extremity Assessment: Generalized weakness;Difficult to assess due to impaired cognition    Lower Extremity Assessment Lower Extremity Assessment: Generalized weakness;Difficult to assess due to impaired cognition    Cervical / Trunk Assessment Cervical / Trunk Assessment: Kyphotic  Communication   Communication: Expressive difficulties;Prefers language other than English (generally non verbal per family)  Cognition Arousal/Alertness: Awake/alert Behavior During Therapy: WFL for tasks assessed/performed Overall Cognitive Status: History of cognitive impairments - at baseline                                 General Comments: advanced dementia, not able to follow  commands, generally non verbal        General Comments      Exercises     Assessment/Plan    PT Assessment Patient does not need any further PT services  PT Problem List         PT Treatment Interventions      PT Goals (Current goals can be found in the Care Plan section)  Acute Rehab PT Goals Patient Stated Goal: to care for pt at home PT Goal Formulation: With family    Frequency       Co-evaluation               AM-PAC PT "6 Clicks" Mobility  Outcome Measure Help needed turning from your back to your side while in a flat bed without using bedrails?: Total Help needed moving from lying on your back to sitting on the side of a flat bed without using bedrails?: Total Help needed moving to and from a bed to a chair (including a wheelchair)?: Total Help needed standing up from a chair using your arms (e.g., wheelchair or bedside chair)?: A Lot Help needed to walk in hospital room?: A Lot Help needed climbing 3-5 steps with a railing? : A Lot 6 Click Score: 9    End of Session Equipment Utilized During Treatment: Gait belt Activity Tolerance: Patient tolerated treatment well Patient left: in chair;with call bell/phone within reach;with family/visitor present;with nursing/sitter in room Nurse Communication: Mobility status      Time: 8115-7262 PT Time Calculation (  min) (ACUTE ONLY): 50 min   Charges:   PT Evaluation $PT Eval Moderate Complexity: 1 Mod PT Treatments $Gait Training: 8-22 mins $Therapeutic Activity: 8-22 mins       Blondell Reveal Kistler PT 02/02/2022  Acute Rehabilitation Services  Office [phone]

## 2022-02-02 NOTE — ED Notes (Signed)
Replaced 2L O2 via [place] secondary to low O2 sat.  Caregiver at bedside.
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
## 2022-02-02 NOTE — ED Notes (Signed)
Pt in bed, pt has bipap in place, pt satting 100% on bipap, resps even and unlabored.

## 2022-02-03 LAB — URINE CULTURE: Culture: NO GROWTH

## 2022-02-06 LAB — CULTURE, BLOOD (ROUTINE X 2)
Culture: NO GROWTH
Culture: NO GROWTH
Special Requests: ADEQUATE

## 2022-03-12 ENCOUNTER — Telehealth (INDEPENDENT_AMBULATORY_CARE_PROVIDER_SITE_OTHER): Payer: Medicare Other | Admitting: Neurology

## 2022-03-12 ENCOUNTER — Telehealth: Payer: Self-pay | Admitting: Neurology

## 2022-03-12 ENCOUNTER — Encounter: Payer: Self-pay | Admitting: Neurology

## 2022-03-12 DIAGNOSIS — F03C18 Unspecified dementia, severe, with other behavioral disturbance: Secondary | ICD-10-CM | POA: Diagnosis not present

## 2022-03-12 MED ORDER — RIVASTIGMINE 4.6 MG/24HR TD PT24: 4.6000 mg | MEDICATED_PATCH | Freq: Every day | TRANSDERMAL | 11 refills | Status: DC

## 2022-03-12 MED ORDER — ESCITALOPRAM OXALATE 20 MG PO TABS: 20.0000 mg | ORAL_TABLET | Freq: Every day | ORAL | 3 refills | Status: DC

## 2022-03-12 NOTE — Progress Notes (Signed)
Virtual Visit via Video Note The purpose of this virtual visit is to provide medical care while limiting exposure to the novel coronavirus.    Consent was obtained for video visit:  Yes.   Answered questions that patient had about telehealth interaction:  Yes.   I discussed the limitations, risks, security and privacy concerns of performing an evaluation and management service by telemedicine. I also discussed with the patient that there may be a patient responsible charge related to this service. The patient expressed understanding and agreed to proceed.  Pt location: Home Physician Location: office Name of referring provider:  Sueanne Margarita, DO I connected with [name] at patients initiation/request on 03/12/2022 at  2:30 PM EST by video enabled telemedicine application and verified that I am speaking with the correct person using two identifiers. Pt MRN:  [id number] Pt DOB:  09/27/1940 Video Participants:  [name];  [name] and [name] (spouse and daughter)   History of Present Illness:  The patient had a virtual video visit on 03/12/2022 for moderate to severe dementia. His wife and daughter provide the history, he is mostly non-verbal during the visit however family says he talks a little bit, greeting his caregiver, smiling. Family manages medications, finances, meals. He is lying on his bed however family reports he walks with assistance, they bring him to the bathroom. He gets out of bed a few times, watching sports games on his recliner or wheelchair. He is able to use his stationary bike. He has not had any behavioral issues at home. Sometimes he stares at the ceiling but would respond when called. Appetite is good, no difficulty swallowing. Family feeds him. Sleep is good with melatonin. They report overall he is a good patient. He was in the hospital last month febrile, hypoxic, tachypneic and was treated for pneumonia. He has recovered well and does not  use home O2, he has a prn nebulizer. He continues on Rivastigmine 4.'6mg'$  patch and Lexapro '20mg'$  daily.     History on Initial Assessment 11/23/2018: This is a 81 year old left-handed man with a history of hypertension, prostate cancer, presenting to establish care for dementia. Family reports memory changes started 3-4 years ago where he would not remember names. Later on he did not recognize friends. His wife reports taking over finances in 2019, it appears he still did the taxes in 2019 (despite being diagnosed with dementia in 2018). He has been evaluated by neurologist Dr. Brett Fairy in 2018.  [place] 23/30 in March 2018, 17/30 in April 2019. He stopped driving a year ago when he would get lost or go past their house. He started going to the [place] [facility] in October 2019. He refused to do [facility] testing but was noted to have cognitive deficits in multiple cognitive domains, with affective and behavioral components of delusions, visual hallucinations, irritability. No gait or sleep component. He had side effects on Donepezil and would miss doses of Rivastigmine. He was switched to the transdermal patch 4.'6mg'$  daily. He had a paraneoplastic panel done which was negative. His last visit at [facility] was 2 months ago, he has seen Psychiatry and done well with addition of Lexapro for anxiety. It was noted that dose of rivastigmine was not increased due to syncopal episode. Diagnosis of late onset dementia,probable Alzheimer's disease, cognitive deficits are moderate to severe.    Family initially felt that the rivastigmine patch was "overacting," so they tried administering the patch every other day. They noticed worsening  and put him back on daily patches. He remembers things better and helps around the house. He can be stubborn and very demanding, wanting to get dressed and shaved early in the morning. Wife reports he is very concerned and worried, he keeps repeating shaving because it is not as good as he  wants. He For the past few months, he has been rummaging a lot in the house. He does not realized ownership of things and would pick up things that are not his and start using it. He gets confused using his electric shaver. They report sleep and appetite are good. Family reports he craves sweets. Family denies any hallucinations since starting the patches. He does not read as much. They deny any paranoia, most of the time he is calm, once in a while he gets a burst and gets mad or upset. He was more emotional when they visited [place] 1.5 years ago, family has not noticed this recently but do wonder about depression.    He denies any headaches, dizziness, vision changes, dysarthria/dysphagia, neck/back pain, focal numbness/tingling/weakness, bowel/bladder dysfunction, anosmia, or tremors. He denies any falls then his wife reminds him she fell and he states he was carrying a lot of things then. He states he "remembers everything."    He had an MRI brain in 03/2018 which I personally reviewed showing moderate diffuse atrophy and mild chronic microvascular disease.     Current Outpatient Medications on File Prior to Visit  Medication Sig Dispense Refill   albuterol (PROVENTIL) (2.5 MG/3ML) 0.083% nebulizer solution Take 3 mLs (2.5 mg total) by nebulization every 6 (six) hours as needed for wheezing. 75 mL 12   Ascorbic Acid (VITAMIN C) 500 MG CAPS Take 1 tablet by mouth daily.     Aspirin 81 MG CAPS Take 1 tablet by mouth daily.     b complex vitamins capsule Take 1 capsule by mouth daily.     cefdinir (OMNICEF) 300 MG capsule Take 1 capsule (300 mg total) by mouth 2 (two) times daily. 10 capsule 0   Cholecalciferol 25 MCG (1000 UT) CHEW Chew 1 tablet by mouth daily at 6 (six) AM.     escitalopram (LEXAPRO) 20 MG tablet Take 1 tablet (20 mg total) by mouth daily. 90 tablet 3   feeding supplement (ENSURE ENLIVE / ENSURE PLUS) LIQD Take 237 mLs by mouth 2 (two) times daily between meals. (Patient taking  differently: Take 237 mLs by mouth daily.) 10000 mL 0   Ketotifen Fumarate (REFRESH EYE ITCH RELIEF OP) Apply 1 drop to eye daily as needed (For dry eyes).     losartan (COZAAR) 50 MG tablet Take 50 mg by mouth daily.     melatonin 3 MG TABS tablet Take 3 mg by mouth at bedtime.     Multiple Vitamin (MULTI-VITAMIN DAILY PO) Take 1 tablet by mouth.     NON FORMULARY Take 5 mLs by mouth 2 (two) times daily. Medication CBD oil BID     predniSONE (DELTASONE) 20 MG tablet Take 2 tablets (40 mg total) by mouth daily with breakfast. 8 tablet 0   rivastigmine (EXELON) 4.6 mg/24hr Place 1 patch (4.6 mg total) onto the skin daily. 30 patch 11   No current facility-administered medications on file prior to visit.     Observations/Objective:   GEN:  The patient appears stated age and is in NAD. He is lying on the bed, smiles and waves when prompted by wife but does not follow examiner's instructions. Extraocular movements  appear intact. He is moving all extremities symmetrically, at least anti-gravity x 4.    Assessment and Plan:   This is an 81 yo LH mand with a history of hypertension, prostate cancer, with moderate to severe dementia, likely due to Alzheimer's disease. He is on Rivastigmine patch 4.'6mg'$  daily and Lexapro '20mg'$  daily. He is well-cared for at home, continue 24/7 care. Family is looking into [facility] memory care, caregiver support provided. Follow-up with Memory Disorders PA Sharene Butters in 3 months, call for any changes.    Follow Up Instructions:   -I discussed the assessment and treatment plan with the patient. The patient was provided an opportunity to ask questions and all were answered. The patient agreed with the plan and demonstrated an understanding of the instructions.   The patient was advised to call back or seek an in-person evaluation if the symptoms worsen or if the condition fails to improve as anticipated.    Cameron Sprang, MD

## 2022-03-12 NOTE — Telephone Encounter (Signed)
Returning a call to update chart for appt  left message with after hour service

## 2022-03-19 NOTE — Patient Instructions (Signed)
Always good to see you. Continue all medications. Continue 24/7 care. Let us know if there is anything we can help with. Follow-up with Memory Disorder PA Sharene Butters in 3 months, call for any changes.

## 2022-04-02 ENCOUNTER — Telehealth: Payer: Self-pay | Admitting: Physician Assistant

## 2022-04-02 NOTE — Telephone Encounter (Signed)
Fax number to come over, then will send over to [facility]

## 2022-04-02 NOTE — Telephone Encounter (Signed)
Pt's daughter called in wanting to see if they can try stopping the rivastigmine patches and see if they were working? The cost is high with the patient being in skilled nursing and the facility keeps putting them in the incorrect places.

## 2022-04-04 NOTE — Telephone Encounter (Signed)
Faxed completed.

## 2022-04-04 NOTE — Telephone Encounter (Signed)
Will fax over

## 2022-04-04 NOTE — Telephone Encounter (Signed)
Pt's daughter called in stating they need a discharge order sent in for the rivastigmine patches. They have not received it. She asked to call Sydell Axon [phone].

## 2022-04-24 DIAGNOSIS — F0390 Unspecified dementia without behavioral disturbance: Secondary | ICD-10-CM

## 2022-04-24 DIAGNOSIS — R635 Abnormal weight gain: Secondary | ICD-10-CM

## 2022-04-30 DIAGNOSIS — U071 COVID-19: Secondary | ICD-10-CM | POA: Diagnosis not present

## 2022-06-06 DIAGNOSIS — R2689 Other abnormalities of gait and mobility: Secondary | ICD-10-CM

## 2022-06-06 DIAGNOSIS — F0390 Unspecified dementia without behavioral disturbance: Secondary | ICD-10-CM | POA: Diagnosis not present

## 2022-06-06 DIAGNOSIS — M6281 Muscle weakness (generalized): Secondary | ICD-10-CM

## 2022-06-18 ENCOUNTER — Telehealth: Payer: Medicare Other | Admitting: Physician Assistant

## 2022-06-27 ENCOUNTER — Telehealth: Payer: Medicare Other | Admitting: Physician Assistant

## 2022-07-01 ENCOUNTER — Encounter: Payer: Self-pay | Admitting: Physician Assistant

## 2022-07-01 ENCOUNTER — Telehealth: Payer: Medicare Other | Admitting: Physician Assistant

## 2022-07-01 VITALS — Ht 68.0 in | Wt 155.0 lb

## 2022-07-01 DIAGNOSIS — F03C18 Unspecified dementia, severe, with other behavioral disturbance: Secondary | ICD-10-CM | POA: Diagnosis not present

## 2022-07-01 NOTE — Progress Notes (Signed)
Virtual Visit via Video Note The purpose of this virtual visit is to provide medical care while limiting exposure to the novel coronavirus.    Consent was obtained for video visit:  Yes.   Answered questions that patient had about telehealth interaction:  Yes.   I discussed the limitations, risks, security and privacy concerns of performing an evaluation and management service by telemedicine. I also discussed with the patient that there may be a patient responsible charge related to this service. The patient expressed understanding and agreed to proceed.  Pt location: Home Physician Location: office Name of referring provider:  Sueanne Margarita, DO I connected with [name] at patients initiation/request on 07/01/2022 at 11:30 AM EDT by video enabled telemedicine application and verified that I am speaking with the correct person using two identifiers. Pt MRN:  [id number] Pt DOB:  [birth year] Video Participants:  [name];   [name] (spouse and daughter)   History of Present Illness:  The patient had a virtual video visit on 07/01/2022  for moderate to severe dementia. His  daughter provide the history, he is nonverbal during the visit.  The patient is now living at [facility], his wife would like to have him back home, but the patient needs 24/7 care, and daughter reports that is not safe for him to be at home with wife, as she may have memory issues herself.  Daughter reports that it has been very stressful for his wife to see him having memory decline.  Facility manages the medications, but there is supervision by family.  Family manages the finances.  Meals are provided by the staff.  He is unable to mobilize, he uses the up lifter at this time.   Daughter report that he is very calm.  He does not follow many commands.  Family feeds him.  He sleeps well.  No further hospitalizations but did have COVID in January 15 which did not require hospital visit.  He is no longer  on rivastigmine 4.6 mg patch;.  He is on Lexapro 20 mg daily.   History on Initial Assessment 11/23/2018: This is a 82 year old left-handed man with a history of hypertension, prostate cancer, presenting to establish care for dementia. Family reports memory changes started 3-4 years ago where he would not remember names. Later on he did not recognize friends. His wife reports taking over finances in 2019, it appears he still did the taxes in 2019 (despite being diagnosed with dementia in 2018). He has been evaluated by neurologist Dr. Brett Fairy in 2018.  [place] 23/30 in March 2018, 17/30 in April 2019. He stopped driving a year ago when he would get lost or go past their house. He started going to the [place] clinic in October 2019. He refused to do [facility] testing but was noted to have cognitive deficits in multiple cognitive domains, with affective and behavioral components of delusions, visual hallucinations, irritability. No gait or sleep component. He had side effects on Donepezil and would miss doses of Rivastigmine. He was switched to the transdermal patch 4.6mg  daily. He had a paraneoplastic panel done which was negative. His last visit at [facility] was 2 months ago, he has seen Psychiatry and done well with addition of Lexapro for anxiety. It was noted that dose of rivastigmine was not increased due to syncopal episode. Diagnosis of late onset dementia,probable Alzheimer's disease, cognitive deficits are moderate to severe.    Family initially felt that the rivastigmine patch was "overacting,"  so they tried administering the patch every other day. They noticed worsening and put him back on daily patches. He remembers things better and helps around the house. He can be stubborn and very demanding, wanting to get dressed and shaved early in the morning. Wife reports he is very concerned and worried, he keeps repeating shaving because it is not as good as he wants. He For the past few months, he has  been rummaging a lot in the house. He does not realized ownership of things and would pick up things that are not his and start using it. He gets confused using his electric shaver. They report sleep and appetite are good. Family reports he craves sweets. Family denies any hallucinations since starting the patches. He does not read as much. They deny any paranoia, most of the time he is calm, once in a while he gets a burst and gets mad or upset. He was more emotional when they visited [place] 1.5 years ago, family has not noticed this recently but do wonder about depression.    He denies any headaches, dizziness, vision changes, dysarthria/dysphagia, neck/back pain, focal numbness/tingling/weakness, bowel/bladder dysfunction, anosmia, or tremors. He denies any falls then his wife reminds him she fell and he states he was carrying a lot of things then. He states he "remembers everything."    He had an MRI brain in 03/2018 which I personally reviewed showing moderate diffuse atrophy and mild chronic microvascular disease.     Current Outpatient Medications on File Prior to Visit  Medication Sig Dispense Refill   albuterol (PROVENTIL) (2.5 MG/3ML) 0.083% nebulizer solution Take 3 mLs (2.5 mg total) by nebulization every 6 (six) hours as needed for wheezing. 75 mL 12   Ascorbic Acid (VITAMIN C) 500 MG CAPS Take 1 tablet by mouth daily.     Aspirin 81 MG CAPS Take 1 tablet by mouth daily.     b complex vitamins capsule Take 1 capsule by mouth daily.     Cholecalciferol 25 MCG (1000 UT) CHEW Chew 1 tablet by mouth daily at 6 (six) AM.     escitalopram (LEXAPRO) 20 MG tablet Take 1 tablet (20 mg total) by mouth daily. 90 tablet 3   feeding supplement (ENSURE ENLIVE / ENSURE PLUS) LIQD Take 237 mLs by mouth 2 (two) times daily between meals. (Patient taking differently: Take 237 mLs by mouth daily.) 10000 mL 0   Ketotifen Fumarate (REFRESH EYE ITCH RELIEF OP) Apply 1 drop to eye daily as needed (For dry  eyes).     losartan (COZAAR) 50 MG tablet Take 50 mg by mouth daily.     melatonin 3 MG TABS tablet Take 3 mg by mouth at bedtime.     Multiple Vitamin (MULTI-VITAMIN DAILY PO) Take 1 tablet by mouth.     NON FORMULARY Take 5 mLs by mouth 2 (two) times daily. Medication CBD oil BID     rivastigmine (EXELON) 4.6 mg/24hr Place 1 patch (4.6 mg total) onto the skin daily. 30 patch 11   No current facility-administered medications on file prior to visit.     Observations/Objective:   GEN:  The patient appears stated age and is in NAD. He is lying on the bed, smiles and waves when prompted by wife but does not follow examiner's instructions. Extraocular movements appear intact. He is moving all extremities symmetrically, at least anti-gravity x 4.    Assessment and Plan:   This is an 82 yo [name] with a history  of hypertension, prostate cancer, with  severe dementia, likely due to Alzheimer's disease. He is no longer rivastigmine patch 4.6mg  daily.  He is on Lexapro 20mg  daily. He is well-cared for at [facility], continue 24/7 care.Caregiver support provided. Follow-up with Dr. Delice Lesch in  3 months, call for any changes.    Follow Up Instructions:   -I discussed the assessment and treatment plan with the patient. The patient was provided an opportunity to ask questions and all were answered. The patient agreed with the plan and demonstrated an understanding of the instructions.   The patient was advised to call back or seek an in-person evaluation if the symptoms worsen or if the condition fails to improve as anticipated. Visit time 24 minutes.   Sharene Butters, PA-C

## 2022-09-03 ENCOUNTER — Emergency Department (HOSPITAL_COMMUNITY)

## 2022-09-03 ENCOUNTER — Inpatient Hospital Stay (HOSPITAL_COMMUNITY)
Admission: EM | Admit: 2022-09-03 | Discharge: 2022-09-08 | DRG: 871 | Disposition: A | Discharge status: Skilled Nursing Facility | Source: Skilled Nursing Facility | Attending: Internal Medicine | Admitting: Internal Medicine

## 2022-09-03 ENCOUNTER — Encounter (HOSPITAL_COMMUNITY): Payer: Self-pay

## 2022-09-03 ENCOUNTER — Other Ambulatory Visit: Payer: Self-pay

## 2022-09-03 DIAGNOSIS — F419 Anxiety disorder, unspecified: Secondary | ICD-10-CM

## 2022-09-03 DIAGNOSIS — R7989 Other specified abnormal findings of blood chemistry: Secondary | ICD-10-CM | POA: Diagnosis not present

## 2022-09-03 DIAGNOSIS — J690 Pneumonitis due to inhalation of food and vomit: Secondary | ICD-10-CM | POA: Diagnosis present

## 2022-09-03 DIAGNOSIS — M810 Age-related osteoporosis without current pathological fracture: Secondary | ICD-10-CM | POA: Diagnosis present

## 2022-09-03 DIAGNOSIS — J9601 Acute respiratory failure with hypoxia: Secondary | ICD-10-CM | POA: Diagnosis not present

## 2022-09-03 DIAGNOSIS — A419 Sepsis, unspecified organism: Principal | ICD-10-CM | POA: Diagnosis present

## 2022-09-03 DIAGNOSIS — Z8673 Personal history of transient ischemic attack (TIA), and cerebral infarction without residual deficits: Secondary | ICD-10-CM

## 2022-09-03 DIAGNOSIS — Z888 Allergy status to other drugs, medicaments and biological substances status: Secondary | ICD-10-CM

## 2022-09-03 DIAGNOSIS — F32A Depression, unspecified: Secondary | ICD-10-CM | POA: Diagnosis present

## 2022-09-03 DIAGNOSIS — Z8249 Family history of ischemic heart disease and other diseases of the circulatory system: Secondary | ICD-10-CM

## 2022-09-03 DIAGNOSIS — G309 Alzheimer's disease, unspecified: Secondary | ICD-10-CM

## 2022-09-03 DIAGNOSIS — R0902 Hypoxemia: Secondary | ICD-10-CM | POA: Diagnosis not present

## 2022-09-03 DIAGNOSIS — Z8546 Personal history of malignant neoplasm of prostate: Secondary | ICD-10-CM

## 2022-09-03 DIAGNOSIS — Z66 Do not resuscitate: Secondary | ICD-10-CM | POA: Diagnosis present

## 2022-09-03 DIAGNOSIS — R64 Cachexia: Secondary | ICD-10-CM | POA: Diagnosis present

## 2022-09-03 DIAGNOSIS — K219 Gastro-esophageal reflux disease without esophagitis: Secondary | ICD-10-CM | POA: Diagnosis present

## 2022-09-03 DIAGNOSIS — T17908A Unspecified foreign body in respiratory tract, part unspecified causing other injury, initial encounter: Secondary | ICD-10-CM

## 2022-09-03 DIAGNOSIS — J45909 Unspecified asthma, uncomplicated: Secondary | ICD-10-CM | POA: Diagnosis present

## 2022-09-03 DIAGNOSIS — F02C11 Dementia in other diseases classified elsewhere, severe, with agitation: Secondary | ICD-10-CM | POA: Diagnosis present

## 2022-09-03 DIAGNOSIS — F02C3 Dementia in other diseases classified elsewhere, severe, with mood disturbance: Secondary | ICD-10-CM | POA: Diagnosis present

## 2022-09-03 DIAGNOSIS — Z7982 Long term (current) use of aspirin: Secondary | ICD-10-CM

## 2022-09-03 DIAGNOSIS — J189 Pneumonia, unspecified organism: Secondary | ICD-10-CM

## 2022-09-03 DIAGNOSIS — Z85820 Personal history of malignant melanoma of skin: Secondary | ICD-10-CM

## 2022-09-03 DIAGNOSIS — Z952 Presence of prosthetic heart valve: Secondary | ICD-10-CM

## 2022-09-03 DIAGNOSIS — I2510 Atherosclerotic heart disease of native coronary artery without angina pectoris: Secondary | ICD-10-CM | POA: Diagnosis present

## 2022-09-03 DIAGNOSIS — I10 Essential (primary) hypertension: Secondary | ICD-10-CM | POA: Diagnosis present

## 2022-09-03 DIAGNOSIS — Z7189 Other specified counseling: Secondary | ICD-10-CM

## 2022-09-03 DIAGNOSIS — Z515 Encounter for palliative care: Secondary | ICD-10-CM

## 2022-09-03 DIAGNOSIS — Z955 Presence of coronary angioplasty implant and graft: Secondary | ICD-10-CM

## 2022-09-03 DIAGNOSIS — Z79899 Other long term (current) drug therapy: Secondary | ICD-10-CM

## 2022-09-03 DIAGNOSIS — R4589 Other symptoms and signs involving emotional state: Secondary | ICD-10-CM

## 2022-09-03 DIAGNOSIS — R6520 Severe sepsis without septic shock: Secondary | ICD-10-CM | POA: Diagnosis present

## 2022-09-03 DIAGNOSIS — R011 Cardiac murmur, unspecified: Secondary | ICD-10-CM | POA: Diagnosis present

## 2022-09-03 DIAGNOSIS — Z880 Allergy status to penicillin: Secondary | ICD-10-CM

## 2022-09-03 DIAGNOSIS — Z7401 Bed confinement status: Secondary | ICD-10-CM

## 2022-09-03 DIAGNOSIS — F02C4 Dementia in other diseases classified elsewhere, severe, with anxiety: Secondary | ICD-10-CM | POA: Diagnosis present

## 2022-09-03 LAB — BLOOD GAS, VENOUS
Acid-Base Excess: 1.7 mmol/L (ref 0.0–2.0)
Bicarbonate: 27.7 mmol/L (ref 20.0–28.0)
O2 Saturation: 97.7 %
Patient temperature: 37
pCO2, Ven: 48 mmHg (ref 44–60)
pH, Ven: 7.37 (ref 7.25–7.43)
pO2, Ven: 77 mmHg — ABNORMAL HIGH (ref 32–45)

## 2022-09-03 LAB — CBC WITH DIFFERENTIAL/PLATELET
Abs Immature Granulocytes: 0.05 10*3/uL (ref 0.00–0.07)
Basophils Absolute: 0 10*3/uL (ref 0.0–0.1)
Basophils Relative: 0 %
Eosinophils Absolute: 0 10*3/uL (ref 0.0–0.5)
Eosinophils Relative: 0 %
HCT: 45 % (ref 39.0–52.0)
Hemoglobin: 14.9 g/dL (ref 13.0–17.0)
Immature Granulocytes: 0 %
Lymphocytes Relative: 10 %
Lymphs Abs: 1.2 10*3/uL (ref 0.7–4.0)
MCH: 32 pg (ref 26.0–34.0)
MCHC: 33.1 g/dL (ref 30.0–36.0)
MCV: 96.8 fL (ref 80.0–100.0)
Monocytes Absolute: 2.2 10*3/uL — ABNORMAL HIGH (ref 0.1–1.0)
Monocytes Relative: 17 %
Neutro Abs: 9 10*3/uL — ABNORMAL HIGH (ref 1.7–7.7)
Neutrophils Relative %: 73 %
Platelets: 167 10*3/uL (ref 150–400)
RBC: 4.65 MIL/uL (ref 4.22–5.81)
RDW: 13.1 % (ref 11.5–15.5)
WBC: 12.5 10*3/uL — ABNORMAL HIGH (ref 4.0–10.5)
nRBC: 0 % (ref 0.0–0.2)

## 2022-09-03 LAB — COMPREHENSIVE METABOLIC PANEL
ALT: 21 U/L (ref 0–44)
AST: 26 U/L (ref 15–41)
Albumin: 3.6 g/dL (ref 3.5–5.0)
Alkaline Phosphatase: 69 U/L (ref 38–126)
Anion gap: 6 (ref 5–15)
BUN: 30 mg/dL — ABNORMAL HIGH (ref 8–23)
CO2: 29 mmol/L (ref 22–32)
Calcium: 8.9 mg/dL (ref 8.9–10.3)
Chloride: 101 mmol/L (ref 98–111)
Creatinine, Ser: 1.02 mg/dL (ref 0.61–1.24)
GFR, Estimated: >60 mL/min (ref >60)
Glucose, Bld: 123 mg/dL — ABNORMAL HIGH (ref 70–99)
Potassium: 4.1 mmol/L (ref 3.5–5.1)
Sodium: 136 mmol/L (ref 135–145)
Total Bilirubin: 0.8 mg/dL (ref 0.3–1.2)
Total Protein: 7.3 g/dL (ref 6.5–8.1)

## 2022-09-03 LAB — I-STAT CHEM 8, ED
BUN: 42 mg/dL — ABNORMAL HIGH (ref 8–23)
Calcium, Ion: 1.17 mmol/L (ref 1.15–1.40)
Chloride: 102 mmol/L (ref 98–111)
Creatinine, Ser: 1 mg/dL (ref 0.61–1.24)
Glucose, Bld: 118 mg/dL — ABNORMAL HIGH (ref 70–99)
HCT: 44 % (ref 39.0–52.0)
Hemoglobin: 15 g/dL (ref 13.0–17.0)
Potassium: 5.3 mmol/L — ABNORMAL HIGH (ref 3.5–5.1)
Sodium: 136 mmol/L (ref 135–145)
TCO2: 30 mmol/L (ref 22–32)

## 2022-09-03 LAB — TROPONIN I (HIGH SENSITIVITY): Troponin I (High Sensitivity): 189 ng/L (ref <18)

## 2022-09-03 LAB — CULTURE, BLOOD (ROUTINE X 2)

## 2022-09-03 LAB — LACTIC ACID, PLASMA: Lactic Acid, Venous: 1.3 mmol/L (ref 0.5–1.9)

## 2022-09-03 LAB — BRAIN NATRIURETIC PEPTIDE: B Natriuretic Peptide: 444.2 pg/mL — ABNORMAL HIGH (ref 0.0–100.0)

## 2022-09-03 MED ORDER — SODIUM CHLORIDE 0.9 % IV SOLN
2.0000 g | Freq: Once | INTRAVENOUS | Status: AC
  Administered 2022-09-03: 2 g via INTRAVENOUS
  Filled 2022-09-03: qty 12.5

## 2022-09-03 MED ORDER — ACETAMINOPHEN 650 MG RE SUPP
RECTAL | Status: AC
  Filled 2022-09-03: qty 1

## 2022-09-03 MED ORDER — IPRATROPIUM BROMIDE 0.02 % IN SOLN
0.5000 mg | Freq: Once | RESPIRATORY_TRACT | Status: AC
  Administered 2022-09-03: 0.5 mg via RESPIRATORY_TRACT
  Filled 2022-09-03: qty 2.5

## 2022-09-03 MED ORDER — VANCOMYCIN HCL IN DEXTROSE 1-5 GM/200ML-% IV SOLN
1000.0000 mg | Freq: Once | INTRAVENOUS | Status: AC
  Administered 2022-09-03: 1000 mg via INTRAVENOUS
  Filled 2022-09-03: qty 200

## 2022-09-03 MED ORDER — ACETAMINOPHEN 650 MG RE SUPP
650.0000 mg | Freq: Once | RECTAL | Status: AC
  Administered 2022-09-03: 650 mg via RECTAL

## 2022-09-03 MED ORDER — FUROSEMIDE 10 MG/ML IJ SOLN
40.0000 mg | Freq: Once | INTRAMUSCULAR | Status: AC
  Administered 2022-09-03: 40 mg via INTRAVENOUS
  Filled 2022-09-03: qty 4

## 2022-09-03 MED ORDER — ALBUTEROL SULFATE (2.5 MG/3ML) 0.083% IN NEBU
5.0000 mg | INHALATION_SOLUTION | Freq: Once | RESPIRATORY_TRACT | Status: AC
  Administered 2022-09-03: 5 mg via RESPIRATORY_TRACT
  Filled 2022-09-03: qty 6

## 2022-09-03 MED ORDER — IOHEXOL 350 MG/ML SOLN
75.0000 mL | Freq: Once | INTRAVENOUS | Status: AC | PRN
  Administered 2022-09-03: 75 mL via INTRAVENOUS

## 2022-09-03 NOTE — H&P (Incomplete)
PCP:   Eloisa Northern, MD   Chief Complaint:  Acute respiratory failure  HPI: This is a 82 year old male with severe end-stage dementia, CAD s/p PCI >5 yrs, prostate cancer, asthma, HTN, h/o MVR, h/o TIA, and subungual melanoma.  Patient is a resident of a nursing home.  He is total care.  He does not speak much and when he does it is incomprehensible.  He is nonambulatory.  Patient does eat well when fed.  Today the patient was found by family having trouble breathing.  He was gasping for air, had a fever, wheezing.  Patient was hypoxic, unclear how low.  Patient is on hospice but has nursing home was having difficulty getting chest x-rays or starting antibiotics.  His wife asked for him to be transferred to the ER.  In the ER patient is poorly responsive.  His oxygen sats was in the 80s.  He was placed on a NR, satting 100%.    CT chest shows 2. Chronic elevation of the right diaphragm. Progressive right lower lobe consolidation with occluded appearance of right lower lobe bronchus. Correlation with bronchoscopy as indicated. Fluid and or debris within the trachea and mainstem bronchi potentially due to aspiration. There is marked bilateral bronchial wall thickening consistent with infectious or inflammatory process. 3. Minimal clustered nodularity in the left lower lobe, also suspect for respiratory infection  Patient troponin 132, patient DNR however wife at bedside wants everything to be done including BiPAP if needed, antibiotics, blood cultures.  Review of Systems:  Unable to obtain due to acuity of condition and patient's severe dementia. Per report distress, hypoxia  Past Medical History: Past Medical History:  Diagnosis Date   Asthma    CAD (coronary artery disease)    Cancer (HCC) 2016   prostate   Complication of anesthesia    CO2 high according to patient    Family history of ovarian cancer    GERD (gastroesophageal reflux disease)    History of melanoma    History of  prostate cancer    Hospice care patient 03/20/2021   HTN (hypertension)    Mitral valve prolapse    Osteoporosis    Severe sepsis (HCC) due to rhinovirus infection 03/13/2021   TIA (transient ischemic attack)    UTI (lower urinary tract infection)    Past Surgical History:  Procedure Laterality Date   APPENDECTOMY     CORONARY ANGIOPLASTY WITH STENT PLACEMENT  04/30/2010   PCI and stenting of proximal RCA   MITRAL VALVE REPAIR  06/13/2010   right mini thoracotomy for complex valvuloplasty with 28mm Memo ring annuloplasty - Dr. [name]   NAILBED REPAIR Right 05/02/2015   Procedure: BIOPSY NAIL BED RIGHT THUMB;  Surgeon: Cindee Salt, MD;  Location: [facility];  Service: Orthopedics;  Laterality: Right;  ANESTHESIA:  [facility]   PROSTATECTOMY  2016    Medications: Prior to Admission medications   Medication Sig Start Date End Date Taking? Authorizing Provider  albuterol (PROVENTIL) (2.5 MG/3ML) 0.083% nebulizer solution Take 3 mLs (2.5 mg total) by nebulization every 6 (six) hours as needed for wheezing. 02/02/22  Yes Danford, Earl Lites, MD  Aspirin 81 MG CAPS Take 1 tablet by mouth daily.   Yes [provider]  carboxymethylcellulose (REFRESH PLUS) 0.5 % SOLN Place 1 drop into both eyes at bedtime.   Yes [provider]  cefdinir (OMNICEF) 300 MG capsule Take 300 mg by mouth 2 (two) times daily.   Yes [provider]  escitalopram (LEXAPRO) 20 MG tablet Take 1 tablet (20 mg total) by mouth daily. 03/12/22  Yes Van Clines, MD  Ketotifen Fumarate (REFRESH EYE [facility] RELIEF OP) Apply 1 drop to eye daily as needed (For dry eyes).   Yes [provider]  LORazepam (ATIVAN) 0.5 MG tablet Take 0.5 mg by mouth every 4 (four) hours as needed for anxiety.   Yes [provider]  losartan (COZAAR) 50 MG tablet Take 50 mg by mouth daily.   Yes [provider]  melatonin 3 MG TABS tablet Take 3 mg by mouth at bedtime.   Yes  [provider]  Morphine Sulfate (MORPHINE CONCENTRATE) 10 mg / 0.5 ml concentrated solution Take 0.25 mLs by mouth every 4 (four) hours as needed for moderate pain. 09/03/22  Yes [provider]  Multiple Vitamin (MULTI-VITAMIN DAILY PO) Take 1 tablet by mouth.   Yes [provider]  feeding supplement (ENSURE ENLIVE / ENSURE PLUS) LIQD Take 237 mLs by mouth 2 (two) times daily between meals. Patient taking differently: Take 237 mLs by mouth daily. 03/20/21   Rolly Salter, MD  rivastigmine (EXELON) 4.6 mg/24hr Place 1 patch (4.6 mg total) onto the skin daily. Patient not taking: Reported on 07/01/2022 03/12/22   Van Clines, MD    Allergies:   Allergies  Allergen Reactions   Aricept [Donepezil]     Upset stomach   Ativan [Lorazepam]     aggigation   Depakote [Divalproex Sodium]     Made me feel like a different person   Penicillins Itching and Rash    Has patient had a PCN reaction causing immediate rash, facial/tongue/throat swelling, SOB or lightheadedness with hypotension: YES Has patient had a PCN reaction causing severe rash involving mucus membranes or skin necrosis: NO Has patient had a PCN reaction that required hospitalization: NO Has patient had a PCN reaction occurring within the last 10 years: NO If all of the above answers are "NO", then may proceed with Cephalosporin use.    Social History:  reports that he has never smoked. He has never used smokeless tobacco. He reports that he does not currently use alcohol. He reports that he does not use drugs.  Family History: Family History  Problem Relation Age of Onset   Heart disease Father    Heart disease Mother    Cirrhosis Brother    Ulcers Brother    Ovarian cancer Other        d. 44    Physical Exam: Vitals:   09/03/22 2003 09/03/22 2115 09/03/22 2211 09/03/22 2220  BP: 136/79 132/73  (!) 129/59  Pulse: 94 88  89  Resp: (!) 31 (!) 28  (!) 23  Temp: 98.9 F (37.2 C)  99.5 F  (37.5 C)   TempSrc: Axillary  Rectal   SpO2: 99% 99%  95%    General:  Alert and oriented times zero, withdraws to pain, moves around in bed to internal stimulus or discomfort.  On nonrebreather Eyes: PERRLA, pink conjunctiva, no scleral icterus ENT: Moist oral mucosa, neck supple, no thyromegaly Lungs: Quiet lungs, poor air movement, no use of accessory muscles Cardiovascular: regular rate and rhythm, no regurgitation, no gallops, no murmurs. No carotid bruits, no JVD Abdomen: soft, positive BS, non-tender, non-distended, no organomegaly, not an acute abdomen GU: not examined Neuro: Not evaluated due to patient's mentation Musculoskeletal: Not evaluated due to patient's mentation, no edema Skin: no rash, no subcutaneous crepitation, no decubitus Psych: Demented, ill  patient  Labs on Admission:  Recent Labs    09/03/22 2003 09/03/22 2125  NA 136 136  K 4.1 5.3*  CL 101 102  CO2 29  --   GLUCOSE 123* 118*  BUN 30* 42*  CREATININE 1.02 1.00  CALCIUM 8.9  --    Recent Labs    09/03/22 2003  AST 26  ALT 21  ALKPHOS 69  BILITOT 0.8  PROT 7.3  ALBUMIN 3.6    Recent Labs    09/03/22 2003 09/03/22 2125  WBC 12.5*  --   NEUTROABS 9.0*  --   HGB 14.9 15.0  HCT 45.0 44.0  MCV 96.8  --   PLT 167  --      Micro Results: Recent Results (from the past 240 hour(s))  Blood culture (routine x 2)     Status: None (Preliminary result)   Collection Time: 09/03/22  9:15 PM   Specimen: BLOOD LEFT ARM  Result Value Ref Range Status   Specimen Description   Final    BLOOD LEFT ARM Performed at [facility], [address], [place], [place] [address]    Special Requests   Final    BOTTLES DRAWN AEROBIC AND ANAEROBIC Blood Culture adequate volume Performed at [facility], [address] [place], [place] [address]    Culture PENDING  Incomplete   Report Status PENDING  Incomplete  Blood culture (routine x 2)     Status: None (Preliminary result)    Collection Time: 09/03/22  9:26 PM   Specimen: BLOOD RIGHT FOREARM  Result Value Ref Range Status   Specimen Description   Final    BLOOD RIGHT FOREARM Performed at [facility], [address], [place], [place] [address]    Special Requests   Final    BOTTLES DRAWN AEROBIC AND ANAEROBIC Blood Culture results may not be optimal due to an inadequate volume of blood received in culture bottles Performed at [facility], [address] [place], [place] [address]    Culture PENDING  Incomplete   Report Status PENDING  Incomplete     Radiological Exams on Admission: CT Angio Chest PE W and/or Wo Contrast  Result Date: 09/03/2022 CLINICAL DATA:  Dyspnea altered EXAM: CT ANGIOGRAPHY CHEST WITH CONTRAST TECHNIQUE: Multidetector CT imaging of the chest was performed using the standard protocol during bolus administration of intravenous contrast. Multiplanar CT image reconstructions and MIPs were obtained to evaluate the vascular anatomy. RADIATION DOSE REDUCTION: This exam was performed according to the departmental dose-optimization program which includes automated exposure control, adjustment of the mA and/or kV according to patient size and/or use of iterative reconstruction technique. CONTRAST:  75mL OMNIPAQUE IOHEXOL 350 MG/ML SOLN COMPARISON:  Chest x-ray 09/03/2022, CT chest 02/01/2022, 07/13/2013 FINDINGS: Cardiovascular: Satisfactory opacification of the pulmonary arteries to the segmental level. No evidence of pulmonary embolism. Moderate aortic atherosclerosis. No aneurysm. Coronary vascular calcification. Normal cardiac size. No pericardial effusion Mediastinum/Nodes: Midline trachea. No thyroid mass. No suspicious lymph nodes. Esophagus within normal limits. Lungs/Pleura: Chronic elevation of the right diaphragm. No pleural effusion or pneumothorax. Progressive right lower lobe consolidation with the occlusion of right lower lobe bronchus. Thickening and narrowing  of right upper lobe bronchi. Probable occlusion of right middle lobe bronchus. Fluid and or debris in the trachea and mainstem bronchi. Left upper and lower lobe bronchial thickening. Minimal clustered nodularity at the left lung base. Upper Abdomen: No acute finding Musculoskeletal: Chronic compression deformities at T3  and T6 and T11. Review of the MIP images confirms the above findings. IMPRESSION: 1. Negative for acute pulmonary embolus. 2. Chronic elevation of the right diaphragm. Progressive right lower lobe consolidation with occluded appearance of right lower lobe bronchus. Correlation with bronchoscopy as indicated. Fluid and or debris within the trachea and mainstem bronchi potentially due to aspiration. There is marked bilateral bronchial wall thickening consistent with infectious or inflammatory process. 3. Minimal clustered nodularity in the left lower lobe, also suspect for respiratory infection Aortic aneurysm NOS (ICD10-I71.9). Electronically Signed   By: Jasmine Pang M.D.   On: 09/03/2022 23:05   DG Chest Port 1 View  Result Date: 09/03/2022 CLINICAL DATA:  Dyspnea EXAM: PORTABLE CHEST 1 VIEW COMPARISON:  02/01/2022 FINDINGS: Lung volumes are small. Elevation of the right hemidiaphragm again noted. Superimposed right basilar consolidation is present. Chin overlies the lung apices, however, no definite pneumothorax. No pleural effusion. Cardiac size is within normal limits. Pulmonary vascularity is normal. No acute bone abnormality. IMPRESSION: 1. Pulmonary hypoinflation. 2. Elevation of the right hemidiaphragm with superimposed right basilar consolidation, atelectasis versus infiltrate. Electronically Signed   By: Helyn Numbers M.D.   On: 09/03/2022 20:21    Assessment/Plan Present on Admission:  End stage severe dementia  Aspiration pneumonia/ respiratory infection LLL -Pneumonia order set initiated -Blood cultures x 2 -Continue IV cefepime and vancomycin -Respiratory  consult -Patient with nonrebreather -Nebulizers as needed -N.p.o., barium swallow   Elevated troponin -Doubt MI, more likely due to demand and infection -Will repeat troponin -No further workup  Mildly elevated potassium -Treating with IV fluids -BMP in a.m.   CAD (coronary artery disease)  Depression -Patient n.p.o., all p.o. medications on hold   Dispo: Hospice  Keshanna Riso 09/03/2022, 11:56 PM

## 2022-09-03 NOTE — Progress Notes (Signed)
PHARMACY -  BRIEF ANTIBIOTIC NOTE   Pharmacy has received consult(s) for cefepime from an ED provider.  The patient's profile has been reviewed for ht/wt/allergies/indication/available labs.    One time order(s) placed for cefepime 2 g  Further antibiotics/pharmacy consults should be ordered by admitting physician if indicated.                       Thank you,  Pricilla Riffle, PharmD, BCPS Clinical Pharmacist 09/03/2022 8:46 PM

## 2022-09-03 NOTE — ED Provider Notes (Signed)
[place] [facility] EMERGENCY DEPARTMENT AT [facility] Provider Note   CSN: [id number] Arrival date & time: 09/03/22  1953     History  Chief Complaint  Patient presents with   Altered Mental Status    NIKE NEWHART is a 82 y.o. male history of dementia, here presenting with shortness of breath and hypoxia.  Patient has dementia and recurrent aspiration pneumonia and was admitted previously.  Patient is DNR but per family, patient was in hospice for about 6 months has been improving until today.  Apparently he is at the facility and he is eating and drinking by himself until this morning.  Family found him altered and hypoxic and having trouble breathing.  EMS was called and patient's oxygen level was 80% on room air.  Patient was put on nonrebreather.  Patient is unable to give any history.  I was able to talk to the daughter who is at bedside as well as wife who was on the phone.  They state that patient is DNR but they were okay with BiPAP and antibiotics and fluids and pressors if needed.  The history is provided by the EMS personnel and a relative.       Home Medications Prior to Admission medications   Medication Sig Start Date End Date Taking? Authorizing Provider  albuterol (PROVENTIL) (2.5 MG/3ML) 0.083% nebulizer solution Take 3 mLs (2.5 mg total) by nebulization every 6 (six) hours as needed for wheezing. 02/02/22   Danford, Earl Lites, MD  Ascorbic Acid (VITAMIN C) 500 MG CAPS Take 1 tablet by mouth daily. Patient not taking: Reported on 07/01/2022    [provider]  Aspirin 81 MG CAPS Take 1 tablet by mouth daily.    [provider]  b complex vitamins capsule Take 1 capsule by mouth daily. Patient not taking: Reported on 07/01/2022    [provider]  Cholecalciferol 25 MCG (1000 UT) CHEW Chew 1 tablet by mouth daily at 6 (six) AM. Patient not taking: Reported on 07/01/2022    [provider]  escitalopram (LEXAPRO) 20 MG  tablet Take 1 tablet (20 mg total) by mouth daily. 03/12/22   Van Clines, MD  feeding supplement (ENSURE ENLIVE / ENSURE PLUS) LIQD Take 237 mLs by mouth 2 (two) times daily between meals. Patient taking differently: Take 237 mLs by mouth daily. 03/20/21   Rolly Salter, MD  Ketotifen Fumarate (REFRESH EYE ITCH RELIEF OP) Apply 1 drop to eye daily as needed (For dry eyes).    [provider]  losartan (COZAAR) 50 MG tablet Take 50 mg by mouth daily.    [provider]  melatonin 3 MG TABS tablet Take 3 mg by mouth at bedtime.    [provider]  Multiple Vitamin (MULTI-VITAMIN DAILY PO) Take 1 tablet by mouth.    [provider]  NON FORMULARY Take 5 mLs by mouth 2 (two) times daily. Medication CBD oil BID    [provider]  rivastigmine (EXELON) 4.6 mg/24hr Place 1 patch (4.6 mg total) onto the skin daily. Patient not taking: Reported on 07/01/2022 03/12/22   Van Clines, MD      Allergies    Aricept [donepezil], Ativan [lorazepam], Depakote [divalproex sodium], and Penicillins    Review of Systems   Review of Systems  Respiratory:  Positive for shortness of breath.   All other systems reviewed and are negative.   Physical Exam Updated Vital Signs BP (!) 129/59   Pulse 89  Temp 99.5 F (37.5 C) (Rectal)   Resp (!) 23   SpO2 95%  Physical Exam Vitals and nursing note reviewed.  Constitutional:      Comments: Altered and confused and hypoxic  HENT:     Head: Normocephalic.     Nose: Nose normal.     Mouth/Throat:     Mouth: Mucous membranes are dry.  Eyes:     Extraocular Movements: Extraocular movements intact.     Pupils: Pupils are equal, round, and reactive to light.  Cardiovascular:     Rate and Rhythm: Normal rate and regular rhythm.     Pulses: Normal pulses.  Pulmonary:     Comments: Diminished right base.  Poor air movement but no obvious wheezing Musculoskeletal:        General: Normal range of motion.      Cervical back: Normal range of motion and neck supple.  Skin:    General: Skin is warm.     Capillary Refill: Capillary refill takes less than 2 seconds.  Neurological:     Comments: Moving all extremities  Psychiatric:     Comments: Unable      ED Results / Procedures / Treatments   Labs (all labs ordered are listed, but only abnormal results are displayed) Labs Reviewed  CBC WITH DIFFERENTIAL/PLATELET - Abnormal; Notable for the following components:      Result Value   WBC 12.5 (*)    Neutro Abs 9.0 (*)    Monocytes Absolute 2.2 (*)    All other components within normal limits  COMPREHENSIVE METABOLIC PANEL - Abnormal; Notable for the following components:   Glucose, Bld 123 (*)    BUN 30 (*)    All other components within normal limits  BLOOD GAS, VENOUS - Abnormal; Notable for the following components:   pO2, Ven 77 (*)    All other components within normal limits  BRAIN NATRIURETIC PEPTIDE - Abnormal; Notable for the following components:   B Natriuretic Peptide 444.2 (*)    All other components within normal limits  I-STAT CHEM 8, ED - Abnormal; Notable for the following components:   Potassium 5.3 (*)    BUN 42 (*)    Glucose, Bld 118 (*)    All other components within normal limits  TROPONIN I (HIGH SENSITIVITY) - Abnormal; Notable for the following components:   Troponin I (High Sensitivity) 189 (*)    All other components within normal limits  CULTURE, BLOOD (ROUTINE X 2)  CULTURE, BLOOD (ROUTINE X 2)  LACTIC ACID, PLASMA  LACTIC ACID, PLASMA  TROPONIN I (HIGH SENSITIVITY)    EKG EKG Interpretation  Date/Time:  Tuesday Sep 03 2022 20:02:29 EDT Ventricular Rate:  95 PR Interval:  137 QRS Duration: 93 QT Interval:  374 QTC Calculation: 471 R Axis:   -5 Text Interpretation: Sinus rhythm Baseline wander in lead(s) V6 No significant change since last tracing Confirmed by Richardean Canal [phone]) on 09/03/2022 8:30:50 PM  Radiology CT Angio Chest PE W  and/or Wo Contrast  Result Date: 09/03/2022 CLINICAL DATA:  Dyspnea altered EXAM: CT ANGIOGRAPHY CHEST WITH CONTRAST TECHNIQUE: Multidetector CT imaging of the chest was performed using the standard protocol during bolus administration of intravenous contrast. Multiplanar CT image reconstructions and MIPs were obtained to evaluate the vascular anatomy. RADIATION DOSE REDUCTION: This exam was performed according to the departmental dose-optimization program which includes automated exposure control, adjustment of the mA and/or kV according to patient size and/or use of iterative reconstruction  technique. CONTRAST:  75mL OMNIPAQUE IOHEXOL 350 MG/ML SOLN COMPARISON:  Chest x-ray 09/03/2022, CT chest 02/01/2022, 07/13/2013 FINDINGS: Cardiovascular: Satisfactory opacification of the pulmonary arteries to the segmental level. No evidence of pulmonary embolism. Moderate aortic atherosclerosis. No aneurysm. Coronary vascular calcification. Normal cardiac size. No pericardial effusion Mediastinum/Nodes: Midline trachea. No thyroid mass. No suspicious lymph nodes. Esophagus within normal limits. Lungs/Pleura: Chronic elevation of the right diaphragm. No pleural effusion or pneumothorax. Progressive right lower lobe consolidation with the occlusion of right lower lobe bronchus. Thickening and narrowing of right upper lobe bronchi. Probable occlusion of right middle lobe bronchus. Fluid and or debris in the trachea and mainstem bronchi. Left upper and lower lobe bronchial thickening. Minimal clustered nodularity at the left lung base. Upper Abdomen: No acute finding Musculoskeletal: Chronic compression deformities at T3 and T6 and T11. Review of the MIP images confirms the above findings. IMPRESSION: 1. Negative for acute pulmonary embolus. 2. Chronic elevation of the right diaphragm. Progressive right lower lobe consolidation with occluded appearance of right lower lobe bronchus. Correlation with bronchoscopy as indicated.  Fluid and or debris within the trachea and mainstem bronchi potentially due to aspiration. There is marked bilateral bronchial wall thickening consistent with infectious or inflammatory process. 3. Minimal clustered nodularity in the left lower lobe, also suspect for respiratory infection Aortic aneurysm NOS (ICD10-I71.9). Electronically Signed   By: Jasmine Pang M.D.   On: 09/03/2022 23:05   DG Chest Port 1 View  Result Date: 09/03/2022 CLINICAL DATA:  Dyspnea EXAM: PORTABLE CHEST 1 VIEW COMPARISON:  02/01/2022 FINDINGS: Lung volumes are small. Elevation of the right hemidiaphragm again noted. Superimposed right basilar consolidation is present. Chin overlies the lung apices, however, no definite pneumothorax. No pleural effusion. Cardiac size is within normal limits. Pulmonary vascularity is normal. No acute bone abnormality. IMPRESSION: 1. Pulmonary hypoinflation. 2. Elevation of the right hemidiaphragm with superimposed right basilar consolidation, atelectasis versus infiltrate. Electronically Signed   By: Helyn Numbers M.D.   On: 09/03/2022 20:21    Procedures Procedures    CRITICAL CARE Performed by: Richardean Canal   Total critical care time: 47 minutes  Critical care time was exclusive of separately billable procedures and treating other patients.  Critical care was necessary to treat or prevent imminent or life-threatening deterioration.  Critical care was time spent personally by me on the following activities: development of treatment plan with patient and/or surrogate as well as nursing, discussions with consultants, evaluation of patient's response to treatment, examination of patient, obtaining history from patient or surrogate, ordering and performing treatments and interventions, ordering and review of laboratory studies, ordering and review of radiographic studies, pulse oximetry and re-evaluation of patient's condition.   Medications Ordered in ED Medications  ceFEPIme  (MAXIPIME) 2 g in sodium chloride 0.9 % 100 mL IVPB (has no administration in time range)  furosemide (LASIX) injection 40 mg (has no administration in time range)  vancomycin (VANCOCIN) IVPB 1000 mg/200 mL premix (1,000 mg Intravenous New Bag/Given 09/03/22 2146)  albuterol (PROVENTIL) (2.5 MG/3ML) 0.083% nebulizer solution 5 mg (5 mg Nebulization Given 09/03/22 2154)  ipratropium (ATROVENT) nebulizer solution 0.5 mg (0.5 mg Nebulization Given 09/03/22 2154)  acetaminophen (TYLENOL) suppository 650 mg (650 mg Rectal Given 09/03/22 2210)  iohexol (OMNIPAQUE) 350 MG/ML injection 75 mL (75 mLs Intravenous Contrast Given 09/03/22 2241)    ED Course/ Medical Decision Making/ A&P  Medical Decision Making ARDARIUS HAASCH is a 81 y.o. male here presenting with hypoxia.  Patient is from a nursing facility and is in hospice but has lived for the last 6 months.  Patient has acute onset of altered mental status and shortness of breath and hypoxia.  Concern for possible PE versus pneumonia versus heart failure.  Plan to get CBC and CMP and lactate and cultures and BNP and CTA chest.  Patient will need admission.  I discussed CODE STATUS with family and they want him DNR but wants medical intervention and labs and pressors and BiPAP as needed  11:11 PM I reviewed patient's labs and independently interpreted imaging studies.  Patient's white blood count is 12.5.  Patient also has elevated troponin likely demand ischemia.  CTA did not show pulmonary embolus but he does have worsening pneumonia.  At this point, patient will need to be admitted for hypoxia from pneumonia.    Problems Addressed: Aspiration pneumonia of right lower lobe, unspecified aspiration pneumonia type [facility]): acute illness or injury Hypoxia: acute illness or injury  Amount and/or Complexity of Data Reviewed Labs: ordered. Decision-making details documented in ED Course. Radiology: ordered and independent  interpretation performed. Decision-making details documented in ED Course.  Risk OTC drugs. Prescription drug management. Decision regarding hospitalization.    Final Clinical Impression(s) / ED Diagnoses Final diagnoses:  None    Rx / DC Orders ED Discharge Orders     None         Charlynne Pander, MD 09/03/22 2317

## 2022-09-03 NOTE — ED Triage Notes (Signed)
EMS arrives with patient on hospice from the [facility]. Unknown if patient is altered at baseline per family. Today while at the facility he started having difficulty breathing. Family called hospice and was advised to have the patient stay at the retirement home. Patients family decided to have the patient reassessed at the hospital. When EMS arrived patients oxygen saturation was in the 80's. Patient was in tripod position on scene. Patient arrives on NRB. Family stated they wanted the patient to be "stable" to come home and be with his wife.

## 2022-09-04 DIAGNOSIS — Z8673 Personal history of transient ischemic attack (TIA), and cerebral infarction without residual deficits: Secondary | ICD-10-CM | POA: Diagnosis not present

## 2022-09-04 DIAGNOSIS — Z515 Encounter for palliative care: Secondary | ICD-10-CM | POA: Diagnosis not present

## 2022-09-04 DIAGNOSIS — F419 Anxiety disorder, unspecified: Secondary | ICD-10-CM | POA: Diagnosis not present

## 2022-09-04 DIAGNOSIS — G309 Alzheimer's disease, unspecified: Secondary | ICD-10-CM | POA: Diagnosis present

## 2022-09-04 DIAGNOSIS — J690 Pneumonitis due to inhalation of food and vomit: Secondary | ICD-10-CM | POA: Diagnosis present

## 2022-09-04 DIAGNOSIS — I10 Essential (primary) hypertension: Secondary | ICD-10-CM | POA: Diagnosis present

## 2022-09-04 DIAGNOSIS — Z880 Allergy status to penicillin: Secondary | ICD-10-CM | POA: Diagnosis not present

## 2022-09-04 DIAGNOSIS — J189 Pneumonia, unspecified organism: Secondary | ICD-10-CM | POA: Diagnosis present

## 2022-09-04 DIAGNOSIS — F02C3 Dementia in other diseases classified elsewhere, severe, with mood disturbance: Secondary | ICD-10-CM | POA: Diagnosis present

## 2022-09-04 DIAGNOSIS — F02C11 Dementia in other diseases classified elsewhere, severe, with agitation: Secondary | ICD-10-CM | POA: Diagnosis present

## 2022-09-04 DIAGNOSIS — R6520 Severe sepsis without septic shock: Secondary | ICD-10-CM | POA: Diagnosis present

## 2022-09-04 DIAGNOSIS — Z8546 Personal history of malignant neoplasm of prostate: Secondary | ICD-10-CM | POA: Diagnosis not present

## 2022-09-04 DIAGNOSIS — A419 Sepsis, unspecified organism: Secondary | ICD-10-CM | POA: Diagnosis present

## 2022-09-04 DIAGNOSIS — J9601 Acute respiratory failure with hypoxia: Secondary | ICD-10-CM | POA: Diagnosis present

## 2022-09-04 DIAGNOSIS — R0902 Hypoxemia: Secondary | ICD-10-CM | POA: Diagnosis present

## 2022-09-04 DIAGNOSIS — Z8249 Family history of ischemic heart disease and other diseases of the circulatory system: Secondary | ICD-10-CM | POA: Diagnosis not present

## 2022-09-04 DIAGNOSIS — R64 Cachexia: Secondary | ICD-10-CM | POA: Diagnosis present

## 2022-09-04 DIAGNOSIS — Z888 Allergy status to other drugs, medicaments and biological substances status: Secondary | ICD-10-CM | POA: Diagnosis not present

## 2022-09-04 DIAGNOSIS — F32A Depression, unspecified: Secondary | ICD-10-CM | POA: Diagnosis present

## 2022-09-04 DIAGNOSIS — Z7189 Other specified counseling: Secondary | ICD-10-CM | POA: Diagnosis not present

## 2022-09-04 DIAGNOSIS — Z952 Presence of prosthetic heart valve: Secondary | ICD-10-CM | POA: Diagnosis not present

## 2022-09-04 DIAGNOSIS — I2510 Atherosclerotic heart disease of native coronary artery without angina pectoris: Secondary | ICD-10-CM | POA: Diagnosis present

## 2022-09-04 DIAGNOSIS — F02C4 Dementia in other diseases classified elsewhere, severe, with anxiety: Secondary | ICD-10-CM | POA: Diagnosis present

## 2022-09-04 DIAGNOSIS — Z955 Presence of coronary angioplasty implant and graft: Secondary | ICD-10-CM | POA: Diagnosis not present

## 2022-09-04 DIAGNOSIS — J45909 Unspecified asthma, uncomplicated: Secondary | ICD-10-CM | POA: Diagnosis present

## 2022-09-04 DIAGNOSIS — Z85820 Personal history of malignant melanoma of skin: Secondary | ICD-10-CM | POA: Diagnosis not present

## 2022-09-04 DIAGNOSIS — Z79899 Other long term (current) drug therapy: Secondary | ICD-10-CM | POA: Diagnosis not present

## 2022-09-04 DIAGNOSIS — Z66 Do not resuscitate: Secondary | ICD-10-CM | POA: Diagnosis present

## 2022-09-04 LAB — BASIC METABOLIC PANEL
Anion gap: 6 (ref 5–15)
BUN: 26 mg/dL — ABNORMAL HIGH (ref 8–23)
CO2: 30 mmol/L (ref 22–32)
Calcium: 8.9 mg/dL (ref 8.9–10.3)
Chloride: 101 mmol/L (ref 98–111)
Creatinine, Ser: 1 mg/dL (ref 0.61–1.24)
GFR, Estimated: >60 mL/min (ref >60)
Glucose, Bld: 122 mg/dL — ABNORMAL HIGH (ref 70–99)
Potassium: 3.8 mmol/L (ref 3.5–5.1)
Sodium: 137 mmol/L (ref 135–145)

## 2022-09-04 LAB — CBC WITH DIFFERENTIAL/PLATELET
Abs Immature Granulocytes: 0.03 10*3/uL (ref 0.00–0.07)
Basophils Absolute: 0.1 10*3/uL (ref 0.0–0.1)
Basophils Relative: 1 %
Eosinophils Absolute: 0.1 10*3/uL (ref 0.0–0.5)
Eosinophils Relative: 1 %
HCT: 44.4 % (ref 39.0–52.0)
Hemoglobin: 14.8 g/dL (ref 13.0–17.0)
Immature Granulocytes: 0 %
Lymphocytes Relative: 10 %
Lymphs Abs: 1 10*3/uL (ref 0.7–4.0)
MCH: 32.3 pg (ref 26.0–34.0)
MCHC: 33.3 g/dL (ref 30.0–36.0)
MCV: 96.9 fL (ref 80.0–100.0)
Monocytes Absolute: 2.2 10*3/uL — ABNORMAL HIGH (ref 0.1–1.0)
Monocytes Relative: 20 %
Neutro Abs: 7.3 10*3/uL (ref 1.7–7.7)
Neutrophils Relative %: 68 %
Platelets: 172 10*3/uL (ref 150–400)
RBC: 4.58 MIL/uL (ref 4.22–5.81)
RDW: 13.1 % (ref 11.5–15.5)
WBC: 10.6 10*3/uL — ABNORMAL HIGH (ref 4.0–10.5)
nRBC: 0 % (ref 0.0–0.2)

## 2022-09-04 LAB — HIV ANTIBODY (ROUTINE TESTING W REFLEX): HIV Screen 4th Generation wRfx: NONREACTIVE

## 2022-09-04 LAB — MAGNESIUM: Magnesium: 1.8 mg/dL (ref 1.7–2.4)

## 2022-09-04 LAB — MRSA NEXT GEN BY PCR, NASAL: MRSA by PCR Next Gen: NOT DETECTED

## 2022-09-04 LAB — CULTURE, BLOOD (ROUTINE X 2): Culture: NO GROWTH

## 2022-09-04 LAB — TROPONIN I (HIGH SENSITIVITY)
Troponin I (High Sensitivity): 132 ng/L (ref <18)
Troponin I (High Sensitivity): 125 ng/L (ref <18)
Troponin I (High Sensitivity): 103 ng/L (ref <18)

## 2022-09-04 LAB — LACTIC ACID, PLASMA: Lactic Acid, Venous: 0.8 mmol/L (ref 0.5–1.9)

## 2022-09-04 LAB — STREP PNEUMONIAE URINARY ANTIGEN: Strep Pneumo Urinary Antigen: NEGATIVE

## 2022-09-04 MED ORDER — ENOXAPARIN SODIUM 40 MG/0.4ML IJ SOSY
40.0000 mg | PREFILLED_SYRINGE | INTRAMUSCULAR | Status: DC
  Administered 2022-09-04 – 2022-09-06 (×3): 40 mg via SUBCUTANEOUS
  Filled 2022-09-04 (×3): qty 0.4

## 2022-09-04 MED ORDER — IPRATROPIUM-ALBUTEROL 0.5-2.5 (3) MG/3ML IN SOLN
3.0000 mL | Freq: Three times a day (TID) | RESPIRATORY_TRACT | Status: DC
  Administered 2022-09-05: 3 mL via RESPIRATORY_TRACT
  Filled 2022-09-04: qty 3

## 2022-09-04 MED ORDER — BUDESONIDE 0.25 MG/2ML IN SUSP
0.2500 mg | Freq: Two times a day (BID) | RESPIRATORY_TRACT | Status: DC
  Administered 2022-09-04 – 2022-09-06 (×5): 0.25 mg via RESPIRATORY_TRACT
  Filled 2022-09-04 (×5): qty 2

## 2022-09-04 MED ORDER — IPRATROPIUM-ALBUTEROL 0.5-2.5 (3) MG/3ML IN SOLN
3.0000 mL | Freq: Four times a day (QID) | RESPIRATORY_TRACT | Status: DC
  Administered 2022-09-04 (×3): 3 mL via RESPIRATORY_TRACT
  Filled 2022-09-04 (×3): qty 3

## 2022-09-04 MED ORDER — ALBUTEROL SULFATE (2.5 MG/3ML) 0.083% IN NEBU: 2.5000 mg | INHALATION_SOLUTION | RESPIRATORY_TRACT | Status: DC | PRN

## 2022-09-04 MED ORDER — FUROSEMIDE 10 MG/ML IJ SOLN
40.0000 mg | Freq: Once | INTRAMUSCULAR | Status: AC
  Administered 2022-09-04: 40 mg via INTRAVENOUS
  Filled 2022-09-04: qty 4

## 2022-09-04 MED ORDER — METHYLPREDNISOLONE SODIUM SUCC 125 MG IJ SOLR
60.0000 mg | Freq: Two times a day (BID) | INTRAMUSCULAR | Status: DC
  Administered 2022-09-04 – 2022-09-06 (×4): 60 mg via INTRAVENOUS
  Filled 2022-09-04 (×4): qty 2

## 2022-09-04 MED ORDER — VANCOMYCIN HCL 1250 MG/250ML IV SOLN
1250.0000 mg | INTRAVENOUS | Status: DC
  Administered 2022-09-04: 1250 mg via INTRAVENOUS
  Filled 2022-09-04 (×2): qty 250

## 2022-09-04 MED ORDER — ARFORMOTEROL TARTRATE 15 MCG/2ML IN NEBU
15.0000 ug | INHALATION_SOLUTION | Freq: Two times a day (BID) | RESPIRATORY_TRACT | Status: DC
  Administered 2022-09-04 – 2022-09-06 (×5): 15 ug via RESPIRATORY_TRACT
  Filled 2022-09-04 (×5): qty 2

## 2022-09-04 MED ORDER — SODIUM CHLORIDE 0.9 % IV SOLN: INTRAVENOUS | Status: DC

## 2022-09-04 MED ORDER — METRONIDAZOLE 500 MG/100ML IV SOLN
500.0000 mg | Freq: Two times a day (BID) | INTRAVENOUS | Status: DC
  Administered 2022-09-04 – 2022-09-06 (×5): 500 mg via INTRAVENOUS
  Filled 2022-09-04 (×5): qty 100

## 2022-09-04 MED ORDER — SODIUM CHLORIDE 0.9 % IV SOLN
2.0000 g | Freq: Two times a day (BID) | INTRAVENOUS | Status: DC
  Administered 2022-09-05 (×3): 2 g via INTRAVENOUS
  Filled 2022-09-04 (×3): qty 12.5

## 2022-09-04 MED ORDER — CHLORHEXIDINE GLUCONATE CLOTH 2 % EX PADS
6.0000 | MEDICATED_PAD | Freq: Every day | CUTANEOUS | Status: DC
  Administered 2022-09-05 (×2): 6 via TOPICAL

## 2022-09-04 NOTE — Consult Note (Signed)
CARDIOLOGY CONSULT NOTE  Patient ID: [name] MRN: [id number] DOB/AGE: [birth year] 82 y.o.  Admit date: 09/03/2022 Referring Physician  Andreas Newport, MD Primary Physician:  Eloisa Northern, MD Reason for Consultation  Co-ordination of care and shortness of breath and positive troponin  Patient ID: [name], male    DOB: November 18, 1940, 82 y.o.   MRN: [id number]  Chief Complaint  Patient presents with   Altered Mental Status   HPI:    [name]  is a 82 y.o. with advanced dementia, patient presently a hospice patient, who is a resident at a nursing home, while one of his friends were visiting found him to be in respiratory distress and eventually patient's family was informed, they found to be to be in respiratory distress and brought him to the emergency room.  He was in hypoxemic respiratory failure with fairly large right lower lobe and middle lobe pneumonia and started on antibiotics and oxygen and stabilized.  Patient's family is known to me, he has been my patient for a long time, has history of coronary artery disease, mitral valve repair and remote TIA.  Patient is nonverbal but does not appear to be in any distress, patient's wife and daughter present at the bedside.  Past Medical History:  Diagnosis Date   Asthma    CAD (coronary artery disease)    Cancer (HCC) 2016   prostate   Complication of anesthesia    CO2 high according to patient    Family history of ovarian cancer    GERD (gastroesophageal reflux disease)    History of melanoma    History of prostate cancer    Hospice care patient 03/20/2021   HTN (hypertension)    Mitral valve prolapse    Osteoporosis    Severe sepsis (HCC) due to rhinovirus infection 03/13/2021   TIA (transient ischemic attack)    UTI (lower urinary tract infection)    Past Surgical History:  Procedure Laterality Date   APPENDECTOMY     CORONARY ANGIOPLASTY WITH STENT PLACEMENT  04/30/2010   PCI and stenting of  proximal RCA   MITRAL VALVE REPAIR  06/13/2010   right mini thoracotomy for complex valvuloplasty with 28mm Memo ring annuloplasty - Dr. [name]   NAILBED REPAIR Right 05/02/2015   Procedure: BIOPSY NAIL BED RIGHT THUMB;  Surgeon: Cindee Salt, MD;  Location: [facility];  Service: Orthopedics;  Laterality: Right;  ANESTHESIA:  [facility]   PROSTATECTOMY  2016   Social History   Tobacco Use   Smoking status: Never   Smokeless tobacco: Never  Substance Use Topics   Alcohol use: Not Currently    Comment: SOCIAL    Family History  Problem Relation Age of Onset   Heart disease Father    Heart disease Mother    Cirrhosis Brother    Ulcers Brother    Ovarian cancer Other        d. 25    Marital Status: Married  ROS  Review of Systems  Unable to perform ROS: Dementia   Objective      09/04/2022    9:00 PM 09/04/2022    8:00 PM 09/04/2022    5:00 PM  Vitals with BMI  Systolic 111 101 725  Diastolic 66 57 43  Pulse 76 64 74    Blood pressure 111/66, pulse 76, temperature 98.2 F (36.8 C), temperature source Axillary, resp. rate (!) 24, height 5\' 8"  (1.727 m), weight 71.8 kg, SpO2 97 %.  Physical Exam Neck:     Vascular: No carotid bruit or JVD.  Cardiovascular:     Rate and Rhythm: Normal rate and regular rhythm.     Pulses: Intact distal pulses.     Heart sounds: Normal heart sounds. No murmur heard.    No gallop.  Pulmonary:     Effort: Pulmonary effort is normal.     Breath sounds: Examination of the right-middle field reveals rales. Examination of the right-lower field reveals rales. Examination of the left-lower field reveals rales. Rales present.  Abdominal:     General: Bowel sounds are normal.     Palpations: Abdomen is soft.  Musculoskeletal:     Right lower leg: No edema.     Left lower leg: No edema.    Laboratory examination:   Recent Labs    02/02/22 0612 09/03/22 2003 09/03/22 2125 09/04/22 0318  NA 138 136 136 137  K 5.2* 4.1  5.3* 3.8  CL 107 101 102 101  CO2 27 29  --  30  GLUCOSE 155* 123* 118* 122*  BUN 20 30* 42* 26*  CREATININE 0.96 1.02 1.00 1.00  CALCIUM 8.4* 8.9  --  8.9  GFRNONAA >60 >60  --  >60   estimated creatinine clearance is 56.1 mL/min (by C-G formula based on SCr of 1 mg/dL).     Latest Ref Rng & Units 09/04/2022    3:18 AM 09/03/2022    9:25 PM 09/03/2022    8:03 PM  CMP  Glucose 70 - 99 mg/dL 161  096  045   BUN 8 - 23 mg/dL 26  42  30   Creatinine 0.61 - 1.24 mg/dL 4.09  8.11  9.14   Sodium 135 - 145 mmol/L 137  136  136   Potassium 3.5 - 5.1 mmol/L 3.8  5.3  4.1   Chloride 98 - 111 mmol/L 101  102  101   CO2 22 - 32 mmol/L 30   29   Calcium 8.9 - 10.3 mg/dL 8.9   8.9   Total Protein 6.5 - 8.1 g/dL   7.3   Total Bilirubin 0.3 - 1.2 mg/dL   0.8   Alkaline Phos 38 - 126 U/L   69   AST 15 - 41 U/L   26   ALT 0 - 44 U/L   21       Latest Ref Rng & Units 09/04/2022    3:18 AM 09/03/2022    9:25 PM 09/03/2022    8:03 PM  CBC  WBC 4.0 - 10.5 K/uL 10.6   12.5   Hemoglobin 13.0 - 17.0 g/dL 78.2  95.6  21.3   Hematocrit 39.0 - 52.0 % 44.4  44.0  45.0   Platelets 150 - 400 K/uL 172   167    Lipid Panel No results for input(s): "CHOL", "TRIG", "LDLCALC", "VLDL", "HDL", "CHOLHDL", "LDLDIRECT" in the last 8760 hours.  HEMOGLOBIN A1C Lab Results  Component Value Date   HGBA1C 5.3 07/17/2013   MPG 105 07/17/2013   TSH No results for input(s): "TSH" in the last 8760 hours. BNP (last 3 results) Recent Labs    02/01/22 1426 09/03/22 2003  BNP 659.0* 444.2*   Cardiac Panel (last 3 results) Recent Labs    09/04/22 0059 09/04/22 0318 09/04/22 0635  TROPONINIHS 132* 125* 103*     Medications and allergies   Allergies  Allergen Reactions   Aricept [Donepezil]     Upset stomach   Ativan [Lorazepam]  aggigation   Depakote [Divalproex Sodium]     Made me feel like a different person   Penicillins Itching and Rash    Has patient had a PCN reaction causing immediate rash,  facial/tongue/throat swelling, SOB or lightheadedness with hypotension: YES Has patient had a PCN reaction causing severe rash involving mucus membranes or skin necrosis: NO Has patient had a PCN reaction that required hospitalization: NO Has patient had a PCN reaction occurring within the last 10 years: NO If all of the above answers are "NO", then may proceed with Cephalosporin use.     Current Meds  Medication Sig   albuterol (PROVENTIL) (2.5 MG/3ML) 0.083% nebulizer solution Take 3 mLs (2.5 mg total) by nebulization every 6 (six) hours as needed for wheezing.   Aspirin 81 MG CAPS Take 1 tablet by mouth daily.   carboxymethylcellulose (REFRESH PLUS) 0.5 % SOLN Place 1 drop into both eyes at bedtime.   cefdinir (OMNICEF) 300 MG capsule Take 300 mg by mouth 2 (two) times daily.   escitalopram (LEXAPRO) 20 MG tablet Take 1 tablet (20 mg total) by mouth daily.   Ketotifen Fumarate (REFRESH EYE ITCH RELIEF OP) Apply 1 drop to eye daily as needed (For dry eyes).   LORazepam (ATIVAN) 0.5 MG tablet Take 0.5 mg by mouth every 4 (four) hours as needed for anxiety.   losartan (COZAAR) 50 MG tablet Take 50 mg by mouth daily.   melatonin 3 MG TABS tablet Take 3 mg by mouth at bedtime.   Morphine Sulfate (MORPHINE CONCENTRATE) 10 mg / 0.5 ml concentrated solution Take 0.25 mLs by mouth every 4 (four) hours as needed for moderate pain.   Multiple Vitamin (MULTI-VITAMIN DAILY PO) Take 1 tablet by mouth.    Scheduled Meds:  arformoterol  15 mcg Nebulization BID   budesonide (PULMICORT) nebulizer solution  0.25 mg Nebulization BID   Chlorhexidine Gluconate Cloth  6 each Topical Daily   enoxaparin (LOVENOX) injection  40 mg Subcutaneous Q24H   ipratropium-albuterol  3 mL Nebulization Q6H   methylPREDNISolone (SOLU-MEDROL) injection  60 mg Intravenous Q12H   Continuous Infusions:  ceFEPime (MAXIPIME) IV     metronidazole Stopped (09/04/22 2100)   vancomycin     PRN Meds:.albuterol   I/O last 3  completed shifts: In: 1134 [I.V.:1055; IV Piggyback:78.9] Out: 800 [Urine:800] No intake/output data recorded.  Net IO Since Admission: 333.95 mL [09/04/22 2150]   Radiology:   CT Angio Chest PE W and/or Wo Contrast  Result Date: 09/03/2022 CLINICAL DATA:  Dyspnea altered EXAM: CT ANGIOGRAPHY CHEST WITH CONTRAST TECHNIQUE: Multidetector CT imaging of the chest was performed using the standard protocol during bolus administration of intravenous contrast. Multiplanar CT image reconstructions and MIPs were obtained to evaluate the vascular anatomy. RADIATION DOSE REDUCTION: This exam was performed according to the departmental dose-optimization program which includes automated exposure control, adjustment of the mA and/or kV according to patient size and/or use of iterative reconstruction technique. CONTRAST:  75mL OMNIPAQUE IOHEXOL 350 MG/ML SOLN COMPARISON:  Chest x-ray 09/03/2022, CT chest 02/01/2022, 07/13/2013 FINDINGS: Cardiovascular: Satisfactory opacification of the pulmonary arteries to the segmental level. No evidence of pulmonary embolism. Moderate aortic atherosclerosis. No aneurysm. Coronary vascular calcification. Normal cardiac size. No pericardial effusion Mediastinum/Nodes: Midline trachea. No thyroid mass. No suspicious lymph nodes. Esophagus within normal limits. Lungs/Pleura: Chronic elevation of the right diaphragm. No pleural effusion or pneumothorax. Progressive right lower lobe consolidation with the occlusion of right lower lobe bronchus. Thickening and narrowing of right upper lobe  bronchi. Probable occlusion of right middle lobe bronchus. Fluid and or debris in the trachea and mainstem bronchi. Left upper and lower lobe bronchial thickening. Minimal clustered nodularity at the left lung base. Upper Abdomen: No acute finding Musculoskeletal: Chronic compression deformities at T3 and T6 and T11. Review of the MIP images confirms the above findings. IMPRESSION: 1. Negative for acute  pulmonary embolus. 2. Chronic elevation of the right diaphragm. Progressive right lower lobe consolidation with occluded appearance of right lower lobe bronchus. Correlation with bronchoscopy as indicated. Fluid and or debris within the trachea and mainstem bronchi potentially due to aspiration. There is marked bilateral bronchial wall thickening consistent with infectious or inflammatory process. 3. Minimal clustered nodularity in the left lower lobe, also suspect for respiratory infection Aortic aneurysm NOS (ICD10-I71.9). Electronically Signed   By: Jasmine Pang M.D.   On: 09/03/2022 23:05   DG Chest Port 1 View  Result Date: 09/03/2022 CLINICAL DATA:  Dyspnea EXAM: PORTABLE CHEST 1 VIEW COMPARISON:  02/01/2022 FINDINGS: Lung volumes are small. Elevation of the right hemidiaphragm again noted. Superimposed right basilar consolidation is present. Chin overlies the lung apices, however, no definite pneumothorax. No pleural effusion. Cardiac size is within normal limits. Pulmonary vascularity is normal. No acute bone abnormality. IMPRESSION: 1. Pulmonary hypoinflation. 2. Elevation of the right hemidiaphragm with superimposed right basilar consolidation, atelectasis versus infiltrate. Electronically Signed   By: Helyn Numbers M.D.   On: 09/03/2022 20:21    Cardiac Studies:   EKG:  EKG 09/03/2021: Normal sinus rhythm at rate of 75 bpm, normal EKG.  Assessment   1.  Extensive right middle and lower lobe pneumonia and occluded appearance of right lower lobe, fluid and debris within the trachea and main bronchi potentially due to aspiration. 2.  Type II MI 3.  Advanced presenile dementia  Recommendations:   I had a long discussion with the patient's family.  Patient is nonverbal, essentially in bed comfortable, we discussed goals of therapy, need for oxygen, IV fluids, antibiotics may not make a large difference in this gentleman who is essentially bedbound and or wheelchair-bound.  He appears very  comfortable, I offered him my services to transition him to complete comfort care and potentially transfer him to [facility] for comfort measures.  Patient's family are not ready for making him complete comfort care, they are to have a conference within themselves today and will let the care team know about the decision.  They are also wondering if they could donate the body for research and are studies.  I have communicated their wishes to the RN, I am available at any time to discuss options, I have left my cell phone number with the family and also the nurse.  I have discussed that if patient was to be transferred to [facility], he will not be on any IV fluids, antibiotics, oxygen therapy and goals will be towards comfort and pain management.  They are completely aware of this and appeared to be comfortable with the decision.   Yates Decamp, MD, [facility] 09/04/2022, 9:50 PM Office: [phone]   Cell: [phone]

## 2022-09-04 NOTE — Progress Notes (Signed)
[facility]) Hospitalized Hospice Patient Visit   Mr. Redder is a hospitalized hospice patient admitted to [facility] services in December of 2023 with a terminal hospice diagnosis of other symptoms and signs involving cognitive functions following other cerebrovascular disease and a secondary diagnosis of vascular dementia, unspecified severity, with mood disturbance. On 5.21.24, [facility] on-call team was called regarding patient agitation and possible aspiration pneumonia.  [facility] RN went to assess patient and family requested patient be transferred to [facility] for diagnosis and treatment. Patient was diagnosed with aspiration pneumonia and is currently admitted to [facility] at [facility]. Per Dr. Patric Dykes of [facility], this is a related hospital admission.    Rounded with patient in room, wife and daughter, Con Memos, at bedside.  Family states they feel patient is improved today from yesterday.  Per family, if patient improves they will likely seek revocation of hospice benefits to seek rehab options.  If patient continues to deteriorate they would look into taking home with hospice services.  Family has liaison contact information if needed during this hospitalization.   Patient is appropriate for inpatient level of care due ongoing medical evaluation for aspiration pneumonia and IV antibiotics.   V/S:  98.8, 81, 21, 130/74, 100% sats on 2L I&O: 353/0 Abnormal lab work:  WBC 10.6, BUN 26, Troponin 103, Glucose 122, B Natriuretic Peptide 444.2 Diagnostics:. Strep Pneumo Urinary Antigen negative, CT angio chest negative for PE and "progressive right lower lobe consolidation with occluded appearance of right lower lobe bronchus.  Fluid and or debris within the trachea and mainstem bronchi potentially due to aspiration.  There is marked bilateral bronchial wall thickening consistent with infectious or inflammatory process.  Minimal clustered nodularity in the left lower lobe, also suspect for  respiratory infection."  Blood cultures pending.   IVs/PRNs:  Vancomycin 1000mg /274mL x 2, Flagyl 500mg  x 1,  Maxipime 2g/0.9% NS x 1, 0.9% NS at 34mL/hr, methylprednisolone sodium succinate 125mg /74mL x 1, Lasix 40mg  x 2  Problem List per MD: Assessment/Plan Present on Admission:  End stage severe dementia  Aspiration pneumonia/ respiratory infection LLL -Pneumonia order set initiated -Blood cultures x 2 -Continue IV cefepime and vancomycin -Respiratory consult -Patient with nonrebreather -Nebulizers as needed -N.p.o., barium swallow    Elevated troponin -Doubt MI, more likely due to demand and infection -Will repeat troponin -No further workup   Mildly elevated potassium -Treating with IV fluids -BMP in a.m.    CAD (coronary artery disease)  Depression -Patient n.p.o., all p.o. medications on hold  D/C planning- Ongoing, family at bedside would like to see how patient progresses over next 1-2 days to determine D/C plan    Goals of Care: Patient current DNR.  Family would like treatment for aspiration and see how patient progresses.   Communication with IDT- [facility] team updated on patient condition.  Communication with PCG - Family updated at bedside.   Please call with any hospice related questions/concerns.  Doreatha Martin, RN, BSN [facility] Liaison [phone]

## 2022-09-04 NOTE — H&P (Incomplete)
PCP:   Eloisa Northern, MD   Chief Complaint:  ***  HPIMadelyn [name] prob w/ breathing. Fever. Distress, gasping for air@ NH,  Pt is total care. Wheezing  In hospice. Bedbound. Doesn't talk, if does nonsensical. Total care    Review of Systems:  The patient denies anorexia, fever, weight loss,, vision loss, decreased hearing, hoarseness, chest pain, syncope, dyspnea on exertion, peripheral edema, balance deficits, hemoptysis, abdominal pain, melena, hematochezia, severe indigestion/heartburn, hematuria, incontinence, genital sores, muscle weakness, suspicious skin lesions, transient blindness, difficulty walking, depression, unusual weight change, abnormal bleeding, enlarged lymph nodes, angioedema, and breast masses.  Past Medical History: Past Medical History:  Diagnosis Date  . Asthma   . CAD (coronary artery disease)   . Cancer [facility]) 2016   prostate  . Complication of anesthesia    CO2 high according to patient   . Family history of ovarian cancer   . GERD (gastroesophageal reflux disease)   . History of melanoma   . History of prostate cancer   . Hospice care patient 03/20/2021  . HTN (hypertension)   . Mitral valve prolapse   . Osteoporosis   . Severe sepsis (HCC) due to rhinovirus infection 03/13/2021  . TIA (transient ischemic attack)   . UTI (lower urinary tract infection)    Past Surgical History:  Procedure Laterality Date  . APPENDECTOMY    . CORONARY ANGIOPLASTY WITH STENT PLACEMENT  04/30/2010   PCI and stenting of proximal RCA  . MITRAL VALVE REPAIR  06/13/2010   right mini thoracotomy for complex valvuloplasty with 28mm Memo ring annuloplasty - Dr. [name]  . NAILBED REPAIR Right 05/02/2015   Procedure: BIOPSY NAIL BED RIGHT THUMB;  Surgeon: Cindee Salt, MD;  Location: [facility];  Service: Orthopedics;  Laterality: Right;  ANESTHESIA:  IV [facility] FAB  . PROSTATECTOMY  2016    Medications: Prior to Admission medications   Medication Sig Start  Date End Date Taking? Authorizing Provider  albuterol (PROVENTIL) (2.5 MG/3ML) 0.083% nebulizer solution Take 3 mLs (2.5 mg total) by nebulization every 6 (six) hours as needed for wheezing. 02/02/22  Yes Danford, Earl Lites, MD  Aspirin 81 MG CAPS Take 1 tablet by mouth daily.   Yes [provider]  carboxymethylcellulose (REFRESH PLUS) 0.5 % SOLN Place 1 drop into both eyes at bedtime.   Yes [provider]  cefdinir (OMNICEF) 300 MG capsule Take 300 mg by mouth 2 (two) times daily.   Yes [provider]  escitalopram (LEXAPRO) 20 MG tablet Take 1 tablet (20 mg total) by mouth daily. 03/12/22  Yes Van Clines, MD  Ketotifen Fumarate (REFRESH EYE [facility] RELIEF OP) Apply 1 drop to eye daily as needed (For dry eyes).   Yes [provider]  LORazepam (ATIVAN) 0.5 MG tablet Take 0.5 mg by mouth every 4 (four) hours as needed for anxiety.   Yes [provider]  losartan (COZAAR) 50 MG tablet Take 50 mg by mouth daily.   Yes [provider]  melatonin 3 MG TABS tablet Take 3 mg by mouth at bedtime.   Yes [provider]  Morphine Sulfate (MORPHINE CONCENTRATE) 10 mg / 0.5 ml concentrated solution Take 0.25 mLs by mouth every 4 (four) hours as needed for moderate pain. 09/03/22  Yes [provider]  Multiple Vitamin (MULTI-VITAMIN DAILY PO) Take 1 tablet by mouth.   Yes [provider]  feeding supplement (ENSURE ENLIVE / ENSURE PLUS) LIQD Take 237 mLs by mouth 2 (  two) times daily between meals. Patient taking differently: Take 237 mLs by mouth daily. 03/20/21   Rolly Salter, MD  rivastigmine (EXELON) 4.6 mg/24hr Place 1 patch (4.6 mg total) onto the skin daily. Patient not taking: Reported on 07/01/2022 03/12/22   Van Clines, MD    Allergies:   Allergies  Allergen Reactions  . Aricept [Donepezil]     Upset stomach  . Ativan [Lorazepam]     aggigation  . Depakote [Divalproex Sodium]     Made me feel like a  different person  . Penicillins Itching and Rash    Has patient had a PCN reaction causing immediate rash, facial/tongue/throat swelling, SOB or lightheadedness with hypotension: YES Has patient had a PCN reaction causing severe rash involving mucus membranes or skin necrosis: NO Has patient had a PCN reaction that required hospitalization: NO Has patient had a PCN reaction occurring within the last 10 years: NO If all of the above answers are "NO", then may proceed with Cephalosporin use.    Social History:  reports that he has never smoked. He has never used smokeless tobacco. He reports that he does not currently use alcohol. He reports that he does not use drugs.  Family History: Family History  Problem Relation Age of Onset  . Heart disease Father   . Heart disease Mother   . Cirrhosis Brother   . Ulcers Brother   . Ovarian cancer Other        d. 51    Physical Exam: Vitals:   09/03/22 2003 09/03/22 2115 09/03/22 2211 09/03/22 2220  BP: 136/79 132/73  (!) 129/59  Pulse: 94 88  89  Resp: (!) 31 (!) 28  (!) 23  Temp: 98.9 F (37.2 C)  99.5 F (37.5 C)   TempSrc: Axillary  Rectal   SpO2: 99% 99%  95%    General:  Alert and oriented times three, well developed and nourished, no acute distress Eyes: PERRLA, pink conjunctiva, no scleral icterus ENT: Moist oral mucosa, neck supple, no thyromegaly Lungs: clear to ascultation, no wheeze, no crackles, no use of accessory muscles Cardiovascular: regular rate and rhythm, no regurgitation, no gallops, no murmurs. No carotid bruits, no JVD Abdomen: soft, positive BS, non-tender, non-distended, no organomegaly, not an acute abdomen GU: not examined Neuro: CN II - XII grossly intact, sensation intact Musculoskeletal: strength 5/5 all extremities, no clubbing, cyanosis or edema Skin: no rash, no subcutaneous crepitation, no decubitus Psych: appropriate patient   Labs on Admission:  Recent Labs    09/03/22 2003 09/03/22 2125   NA 136 136  K 4.1 5.3*  CL 101 102  CO2 29  --   GLUCOSE 123* 118*  BUN 30* 42*  CREATININE 1.02 1.00  CALCIUM 8.9  --    Recent Labs    09/03/22 2003  AST 26  ALT 21  ALKPHOS 69  BILITOT 0.8  PROT 7.3  ALBUMIN 3.6    Recent Labs    09/03/22 2003 09/03/22 2125  WBC 12.5*  --   NEUTROABS 9.0*  --   HGB 14.9 15.0  HCT 45.0 44.0  MCV 96.8  --   PLT 167  --      Micro Results: Recent Results (from the past 240 hour(s))  Blood culture (routine x 2)     Status: None (Preliminary result)   Collection Time: 09/03/22  9:15 PM   Specimen: BLOOD LEFT ARM  Result Value Ref Range Status   Specimen Description  Final    BLOOD LEFT ARM Performed at [facility], [address] [place], [place] [address]    Special Requests   Final    BOTTLES DRAWN AEROBIC AND ANAEROBIC Blood Culture adequate volume Performed at [facility], [address] [place], [place] [address]    Culture PENDING  Incomplete   Report Status PENDING  Incomplete  Blood culture (routine x 2)     Status: None (Preliminary result)   Collection Time: 09/03/22  9:26 PM   Specimen: BLOOD RIGHT FOREARM  Result Value Ref Range Status   Specimen Description   Final    BLOOD RIGHT FOREARM Performed at [facility], [address], [place], [place] [address]    Special Requests   Final    BOTTLES DRAWN AEROBIC AND ANAEROBIC Blood Culture results may not be optimal due to an inadequate volume of blood received in culture bottles Performed at [facility], [address] [place], [place] [address]    Culture PENDING  Incomplete   Report Status PENDING  Incomplete     Radiological Exams on Admission: CT Angio Chest PE W and/or Wo Contrast  Result Date: 09/03/2022 CLINICAL DATA:  Dyspnea altered EXAM: CT ANGIOGRAPHY CHEST WITH CONTRAST TECHNIQUE: Multidetector CT imaging of the chest was performed using the standard protocol during bolus administration  of intravenous contrast. Multiplanar CT image reconstructions and MIPs were obtained to evaluate the vascular anatomy. RADIATION DOSE REDUCTION: This exam was performed according to the departmental dose-optimization program which includes automated exposure control, adjustment of the mA and/or kV according to patient size and/or use of iterative reconstruction technique. CONTRAST:  75mL OMNIPAQUE IOHEXOL 350 MG/ML SOLN COMPARISON:  Chest x-ray 09/03/2022, CT chest 02/01/2022, 07/13/2013 FINDINGS: Cardiovascular: Satisfactory opacification of the pulmonary arteries to the segmental level. No evidence of pulmonary embolism. Moderate aortic atherosclerosis. No aneurysm. Coronary vascular calcification. Normal cardiac size. No pericardial effusion Mediastinum/Nodes: Midline trachea. No thyroid mass. No suspicious lymph nodes. Esophagus within normal limits. Lungs/Pleura: Chronic elevation of the right diaphragm. No pleural effusion or pneumothorax. Progressive right lower lobe consolidation with the occlusion of right lower lobe bronchus. Thickening and narrowing of right upper lobe bronchi. Probable occlusion of right middle lobe bronchus. Fluid and or debris in the trachea and mainstem bronchi. Left upper and lower lobe bronchial thickening. Minimal clustered nodularity at the left lung base. Upper Abdomen: No acute finding Musculoskeletal: Chronic compression deformities at T3 and T6 and T11. Review of the MIP images confirms the above findings. IMPRESSION: 1. Negative for acute pulmonary embolus. 2. Chronic elevation of the right diaphragm. Progressive right lower lobe consolidation with occluded appearance of right lower lobe bronchus. Correlation with bronchoscopy as indicated. Fluid and or debris within the trachea and mainstem bronchi potentially due to aspiration. There is marked bilateral bronchial wall thickening consistent with infectious or inflammatory process. 3. Minimal clustered nodularity in the left  lower lobe, also suspect for respiratory infection Aortic aneurysm NOS (ICD10-I71.9). Electronically Signed   By: Jasmine Pang M.D.   On: 09/03/2022 23:05   DG Chest Port 1 View  Result Date: 09/03/2022 CLINICAL DATA:  Dyspnea EXAM: PORTABLE CHEST 1 VIEW COMPARISON:  02/01/2022 FINDINGS: Lung volumes are small. Elevation of the right hemidiaphragm again noted. Superimposed right basilar consolidation is present. Chin overlies the lung apices, however, no definite pneumothorax. No pleural effusion. Cardiac size is within normal limits. Pulmonary vascularity is normal. No acute bone abnormality. IMPRESSION: 1. Pulmonary hypoinflation.  2. Elevation of the right hemidiaphragm with superimposed right basilar consolidation, atelectasis versus infiltrate. Electronically Signed   By: Helyn Numbers M.D.   On: 09/03/2022 20:21    Assessment/Plan Present on Admission: . CAD (coronary artery disease) . CARDIAC MURMUR, SYSTOLIC   Korver Graybeal 09/03/2022, 11:56 PM

## 2022-09-04 NOTE — Progress Notes (Signed)
Pharmacy Antibiotic Note  [name] is a 82 y.o. male admitted on 09/03/2022 with pneumonia.  In the ED patient received Vancomycin 1gm IV, Cefepime 2gm IV x 1 dose each. Pharmacy has been consulted for Vancomycin and Cefepime dosing.  Plan: Cefepime 2gm IV q12h Vancomycin 1250 mg IV Q 24 hrs. Goal AUC 400-550.  Expected AUC: 474.8  SCr used: 1 Follow renal function F/u culture results and sensitivities  Height: 5\' 8"  (172.7 cm) Weight: 71.8 kg (158 lb 4.6 oz) IBW/kg (Calculated) : 68.4  Temp (24hrs), Avg:99 F (37.2 C), Min:98.6 F (37 C), Max:99.5 F (37.5 C)  Recent Labs  Lab 09/03/22 2003 09/03/22 2125 09/04/22 0058 09/04/22 0318  WBC 12.5*  --   --  10.6*  CREATININE 1.02 1.00  --  1.00  LATICACIDVEN 1.3  --  0.8  --     Estimated Creatinine Clearance: 56.1 mL/min (by C-G formula based on SCr of 1 mg/dL).    Allergies  Allergen Reactions   Aricept [Donepezil]     Upset stomach   Ativan [Lorazepam]     aggigation   Depakote [Divalproex Sodium]     Made me feel like a different person   Penicillins Itching and Rash    Has patient had a PCN reaction causing immediate rash, facial/tongue/throat swelling, SOB or lightheadedness with hypotension: YES Has patient had a PCN reaction causing severe rash involving mucus membranes or skin necrosis: NO Has patient had a PCN reaction that required hospitalization: NO Has patient had a PCN reaction occurring within the last 10 years: NO If all of the above answers are "NO", then may proceed with Cephalosporin use.    Antimicrobials this admission: 5/21 Vancomycin >>   5/21 Cefepime >>    Dose adjustments this admission:    Microbiology results: 5/21 BCx:   5/21 MRSA PCR:    Thank you for allowing pharmacy to be a part of this patient's care.  Maryellen Pile, PharmD 09/04/2022 5:43 AM

## 2022-09-04 NOTE — Evaluation (Addendum)
SLP Cancellation Note  Patient Details Name: [name] MRN: [id number] DOB: 07/08/1940   Cancelled treatment:       Reason Eval/Treat Not Completed: Other (comment) (RN reports pt is arousable but sleepy, will continue efforts)  Pt familiar to this SLP from visit in 03/2021 - where he was deemed to be aspiration risk with any po intake. Pt with minimal participation at that time - and MBS not completed due to pt cognitive status prohibiting.  It was advised to consider a comfort diet at that time with known aspiration risk.     Rolena Infante, MS [facility] SLP Acute Rehab Services Office [phone]   Chales Abrahams 09/04/2022, 8:12 AM

## 2022-09-04 NOTE — Progress Notes (Signed)
PROGRESS NOTE  ISMEAL WEHRS JXB:[id number] DOB: 09-25-1940 DOA: 09/03/2022 PCP: Eloisa Northern, MD  HPI/Recap of past 36 hours: 82 year old male with advanced dementia, CAD s/p PCI >5 yrs, prostate cancer, asthma, HTN, h/o MVR, h/o TIA, and subungual melanoma.  Patient is a resident of a nursing home.  He is total care.  He does not speak much and when he does it is incomprehensible.  He is nonambulatory.  Patient does eat well when fed. Patient was found by family having trouble breathing.  He was noted to be gasping for air, had a fever, and wheezing.  Patient was hypoxic, unclear how low.  Patient is currently being followed by hospice but nursing home was having difficulty getting chest x-rays or starting antibiotics. His wife asked for him to be transferred to the ER. In the ER patient is poorly responsive.  His oxygen sats was in the 80s.  He was placed on a NR, satting 100%.  Patient admitted for further management.    Today, patient still requiring about 6 L of O2, lethargic, unable to follow any commands, acutely ill-appearing with tachypnea.  Discussed extensively with daughter who is an optometrist and wife at bedside.  GI Dr. Meridee Score who is a friend of the family was present at bedside.  Family wants patient to be managed for aspiration pneumonia.  Family is very realistic about patient's condition and wants to maintain a DNR status.  Further conversations about continuation of hospice services will be discussed depending on patient's clinical status over the next day or 2.    Assessment/Plan: Principal Problem:   Acute respiratory failure with hypoxia (HCC) Active Problems:   CARDIAC MURMUR, SYSTOLIC   CAD (coronary artery disease)   Goals of care, counseling/discussion   Acute hypoxic respiratory failure Likely aspiration pneumonia Sepsis present on admission (tachypneic, tachycardic) History of asthma Currently afebrile, with leukocytosis Blood cultures x 2  pending Urine strep pneumo negative Continue DuoNebs, Pulmicort, Brovana Continue IV cefepime, Flagyl and vancomycin, plan to de-escalate to Unasyn in the next day pending clinical status Gave a dose of Lasix, monitor for further needs Started on IV Solu-Medrol due to history of asthma and was noted to be wheezing on examination Supplemental O2, nonrebreather as needed N.p.o., SLP pending once more awake  Elevated troponin History of CAD Doubt MI, more likely due to demand and infection Trending down slowly, 132-125-103 EKG with no acute ST changes Telemetry, monitor closely  Hypertension BP stable to soft Monitor closely  Advanced dementia Goals of care discussion Had extensive discussion with spouse and daughter at bedside Plan to monitor patient's for the next day or 2 and determine further goals of care Palliative to be consulted       Estimated body mass index is 24.07 kg/m as calculated from the following:   Height as of this encounter: 5\' 8"  (1.727 m).   Weight as of this encounter: 71.8 kg.     Code Status: DNR  Family Communication: Discussed with wife and daughter at bedside  Disposition Plan: Status is: Inpatient Remains inpatient appropriate because: Level of care      Consultants: None  Procedures: None  Antimicrobials: Cefepime Flagyl Vancomycin  DVT prophylaxis: Lovenox   Objective: Vitals:   09/04/22 1400 09/04/22 1500 09/04/22 1600 09/04/22 1700  BP: 130/74 (!) 97/51 (!) 105/45 (!) 109/43  Pulse: 81 79 79 74  Resp: (!) 21 (!) 23 (!) 22 (!) 21  Temp:      TempSrc:  SpO2: 100% 98% 100% 100%  Weight:      Height:        Intake/Output Summary (Last 24 hours) at 09/04/2022 1905 Last data filed at 09/04/2022 1345 Gross per 24 hour  Intake 1133.95 ml  Output 800 ml  Net 333.95 ml   Filed Weights   09/04/22 0240  Weight: 71.8 kg    Exam: General: Acutely ill-appearing, lethargic, not oriented/not alert or  awake Cardiovascular: S1, S2 present Respiratory: Coarse breath sounds, bilateral wheezing with some bibasilar crackles Abdomen: Soft, nontender, nondistended, bowel sounds present Musculoskeletal: No bilateral pedal edema noted Skin: Normal Psychiatry: Unable to assess    Data Reviewed: CBC: Recent Labs  Lab 09/03/22 2003 09/03/22 2125 09/04/22 0318  WBC 12.5*  --  10.6*  NEUTROABS 9.0*  --  7.3  HGB 14.9 15.0 14.8  HCT 45.0 44.0 44.4  MCV 96.8  --  96.9  PLT 167  --  172   Basic Metabolic Panel: Recent Labs  Lab 09/03/22 2003 09/03/22 2125 09/04/22 0318  NA 136 136 137  K 4.1 5.3* 3.8  CL 101 102 101  CO2 29  --  30  GLUCOSE 123* 118* 122*  BUN 30* 42* 26*  CREATININE 1.02 1.00 1.00  CALCIUM 8.9  --  8.9  MG  --   --  1.8   GFR: Estimated Creatinine Clearance: 56.1 mL/min (by C-G formula based on SCr of 1 mg/dL). Liver Function Tests: Recent Labs  Lab 09/03/22 2003  AST 26  ALT 21  ALKPHOS 69  BILITOT 0.8  PROT 7.3  ALBUMIN 3.6   No results for input(s): "LIPASE", "AMYLASE" in the last 168 hours. No results for input(s): "AMMONIA" in the last 168 hours. Coagulation Profile: No results for input(s): "INR", "PROTIME" in the last 168 hours. Cardiac Enzymes: No results for input(s): "CKTOTAL", "CKMB", "CKMBINDEX", "TROPONINI" in the last 168 hours. BNP (last 3 results) No results for input(s): "PROBNP" in the last 8760 hours. HbA1C: No results for input(s): "HGBA1C" in the last 72 hours. CBG: No results for input(s): "GLUCAP" in the last 168 hours. Lipid Profile: No results for input(s): "CHOL", "HDL", "LDLCALC", "TRIG", "CHOLHDL", "LDLDIRECT" in the last 72 hours. Thyroid Function Tests: No results for input(s): "TSH", "T4TOTAL", "FREET4", "T3FREE", "THYROIDAB" in the last 72 hours. Anemia Panel: No results for input(s): "VITAMINB12", "FOLATE", "FERRITIN", "TIBC", "IRON", "RETICCTPCT" in the last 72 hours. Urine analysis:    Component Value  Date/Time   COLORURINE YELLOW 02/02/2022 0428   APPEARANCEUR CLEAR 02/02/2022 0428   LABSPEC 1.020 02/02/2022 0428   PHURINE 6.5 02/02/2022 0428   GLUCOSEU NEGATIVE 02/02/2022 0428   HGBUR NEGATIVE 02/02/2022 0428   BILIRUBINUR NEGATIVE 02/02/2022 0428   KETONESUR NEGATIVE 02/02/2022 0428   PROTEINUR 30 (A) 02/02/2022 0428   UROBILINOGEN 0.2 07/15/2013 2107   NITRITE NEGATIVE 02/02/2022 0428   LEUKOCYTESUR NEGATIVE 02/02/2022 0428   Sepsis Labs: @LABRCNTIP (procalcitonin:4,lacticidven:4)  ) Recent Results (from the past 240 hour(s))  Blood culture (routine x 2)     Status: None (Preliminary result)   Collection Time: 09/03/22  9:15 PM   Specimen: BLOOD LEFT ARM  Result Value Ref Range Status   Specimen Description   Final    BLOOD LEFT ARM Performed at [facility], [address] [place], [place] [address]    Special Requests   Final    BOTTLES DRAWN AEROBIC AND ANAEROBIC Blood Culture adequate volume Performed at [facility], [address] [place],  [place] [address]    Culture   Final    NO GROWTH < 12 HOURS Performed at [facility], [address], [place], [place] [address]    Report Status PENDING  Incomplete  Blood culture (routine x 2)     Status: None (Preliminary result)   Collection Time: 09/03/22  9:26 PM   Specimen: BLOOD RIGHT FOREARM  Result Value Ref Range Status   Specimen Description   Final    BLOOD RIGHT FOREARM Performed at [facility], [address] [place], [place] [address]    Special Requests   Final    BOTTLES DRAWN AEROBIC AND ANAEROBIC Blood Culture results may not be optimal due to an inadequate volume of blood received in culture bottles Performed at [facility], [address] [place], [place] [address]    Culture   Final    NO GROWTH < 12 HOURS Performed at [facility], [address] [place], [place] [address]    Report Status PENDING  Incomplete  MRSA Next Gen  by PCR, Nasal     Status: None   Collection Time: 09/04/22  2:34 AM   Specimen: Nasal Mucosa; Nasal Swab  Result Value Ref Range Status   MRSA by PCR Next Gen NOT DETECTED NOT DETECTED Final    Comment: (NOTE) The GeneXpert MRSA Assay (FDA approved for NASAL specimens only), is one component of a comprehensive MRSA colonization surveillance program. It is not intended to diagnose MRSA infection nor to guide or monitor treatment for MRSA infections. Test performance is not FDA approved in patients less than 25 years old. Performed at [facility], [address] [place], [place] [address]       Studies: CT Angio Chest PE W and/or Wo Contrast  Result Date: 09/03/2022 CLINICAL DATA:  Dyspnea altered EXAM: CT ANGIOGRAPHY CHEST WITH CONTRAST TECHNIQUE: Multidetector CT imaging of the chest was performed using the standard protocol during bolus administration of intravenous contrast. Multiplanar CT image reconstructions and MIPs were obtained to evaluate the vascular anatomy. RADIATION DOSE REDUCTION: This exam was performed according to the departmental dose-optimization program which includes automated exposure control, adjustment of the mA and/or kV according to patient size and/or use of iterative reconstruction technique. CONTRAST:  75mL OMNIPAQUE IOHEXOL 350 MG/ML SOLN COMPARISON:  Chest x-ray 09/03/2022, CT chest 02/01/2022, 07/13/2013 FINDINGS: Cardiovascular: Satisfactory opacification of the pulmonary arteries to the segmental level. No evidence of pulmonary embolism. Moderate aortic atherosclerosis. No aneurysm. Coronary vascular calcification. Normal cardiac size. No pericardial effusion Mediastinum/Nodes: Midline trachea. No thyroid mass. No suspicious lymph nodes. Esophagus within normal limits. Lungs/Pleura: Chronic elevation of the right diaphragm. No pleural effusion or pneumothorax. Progressive right lower lobe consolidation with the occlusion of right lower lobe  bronchus. Thickening and narrowing of right upper lobe bronchi. Probable occlusion of right middle lobe bronchus. Fluid and or debris in the trachea and mainstem bronchi. Left upper and lower lobe bronchial thickening. Minimal clustered nodularity at the left lung base. Upper Abdomen: No acute finding Musculoskeletal: Chronic compression deformities at T3 and T6 and T11. Review of the MIP images confirms the above findings. IMPRESSION: 1. Negative for acute pulmonary embolus. 2. Chronic elevation of the right diaphragm. Progressive right lower lobe consolidation with occluded appearance of right lower lobe bronchus. Correlation with bronchoscopy as indicated. Fluid and or debris within the trachea and mainstem bronchi potentially due to aspiration. There is marked bilateral bronchial wall thickening consistent with infectious  or inflammatory process. 3. Minimal clustered nodularity in the left lower lobe, also suspect for respiratory infection Aortic aneurysm NOS (ICD10-I71.9). Electronically Signed   By: Jasmine Pang M.D.   On: 09/03/2022 23:05   DG Chest Port 1 View  Result Date: 09/03/2022 CLINICAL DATA:  Dyspnea EXAM: PORTABLE CHEST 1 VIEW COMPARISON:  02/01/2022 FINDINGS: Lung volumes are small. Elevation of the right hemidiaphragm again noted. Superimposed right basilar consolidation is present. Chin overlies the lung apices, however, no definite pneumothorax. No pleural effusion. Cardiac size is within normal limits. Pulmonary vascularity is normal. No acute bone abnormality. IMPRESSION: 1. Pulmonary hypoinflation. 2. Elevation of the right hemidiaphragm with superimposed right basilar consolidation, atelectasis versus infiltrate. Electronically Signed   By: Helyn Numbers M.D.   On: 09/03/2022 20:21    Scheduled Meds:  arformoterol  15 mcg Nebulization BID   budesonide (PULMICORT) nebulizer solution  0.25 mg Nebulization BID   Chlorhexidine Gluconate Cloth  6 each Topical Daily   enoxaparin  (LOVENOX) injection  40 mg Subcutaneous Q24H   ipratropium-albuterol  3 mL Nebulization Q6H   methylPREDNISolone (SOLU-MEDROL) injection  60 mg Intravenous Q12H    Continuous Infusions:  ceFEPime (MAXIPIME) IV     metronidazole Stopped (09/04/22 0839)   vancomycin       LOS: 0 days     Briant Cedar, MD [facility]  If 7PM-7AM, please contact night-coverage [email or web address] 09/04/2022, 7:05 PM

## 2022-09-05 DIAGNOSIS — J9601 Acute respiratory failure with hypoxia: Secondary | ICD-10-CM | POA: Diagnosis not present

## 2022-09-05 LAB — BASIC METABOLIC PANEL
Anion gap: 10 (ref 5–15)
BUN: 33 mg/dL — ABNORMAL HIGH (ref 8–23)
CO2: 28 mmol/L (ref 22–32)
Calcium: 8.9 mg/dL (ref 8.9–10.3)
Chloride: 102 mmol/L (ref 98–111)
Creatinine, Ser: 1.18 mg/dL (ref 0.61–1.24)
GFR, Estimated: >60 mL/min (ref >60)
Glucose, Bld: 150 mg/dL — ABNORMAL HIGH (ref 70–99)
Potassium: 4.4 mmol/L (ref 3.5–5.1)
Sodium: 140 mmol/L (ref 135–145)

## 2022-09-05 LAB — CBC WITH DIFFERENTIAL/PLATELET
Abs Immature Granulocytes: 0.07 10*3/uL (ref 0.00–0.07)
Basophils Absolute: 0 10*3/uL (ref 0.0–0.1)
Basophils Relative: 0 %
Eosinophils Absolute: 0 10*3/uL (ref 0.0–0.5)
Eosinophils Relative: 0 %
HCT: 45.7 % (ref 39.0–52.0)
Hemoglobin: 15.1 g/dL (ref 13.0–17.0)
Immature Granulocytes: 1 %
Lymphocytes Relative: 6 %
Lymphs Abs: 0.6 10*3/uL — ABNORMAL LOW (ref 0.7–4.0)
MCH: 32.3 pg (ref 26.0–34.0)
MCHC: 33 g/dL (ref 30.0–36.0)
MCV: 97.9 fL (ref 80.0–100.0)
Monocytes Absolute: 0.6 10*3/uL (ref 0.1–1.0)
Monocytes Relative: 6 %
Neutro Abs: 8.1 10*3/uL — ABNORMAL HIGH (ref 1.7–7.7)
Neutrophils Relative %: 87 %
Platelets: 192 10*3/uL (ref 150–400)
RBC: 4.67 MIL/uL (ref 4.22–5.81)
RDW: 13.1 % (ref 11.5–15.5)
WBC: 9.4 10*3/uL (ref 4.0–10.5)
nRBC: 0 % (ref 0.0–0.2)

## 2022-09-05 LAB — CULTURE, BLOOD (ROUTINE X 2)

## 2022-09-05 MED ORDER — IPRATROPIUM-ALBUTEROL 0.5-2.5 (3) MG/3ML IN SOLN
3.0000 mL | Freq: Two times a day (BID) | RESPIRATORY_TRACT | Status: DC
  Administered 2022-09-05 – 2022-09-06 (×2): 3 mL via RESPIRATORY_TRACT
  Filled 2022-09-05 (×2): qty 3

## 2022-09-05 MED ORDER — FOOD THICKENER (SIMPLYTHICK): 1.0000 | ORAL | Status: DC | PRN

## 2022-09-05 NOTE — Progress Notes (Signed)
[facility] 1229 [facility]) Hospitalized Hospice Patient Visit   [name] is a hospitalized hospice patient admitted to [facility] services in December of 2023 with a terminal hospice diagnosis of other symptoms and signs involving cognitive functions following other cerebrovascular disease and a secondary diagnosis of vascular dementia, unspecified severity, with mood disturbance. On 5.21.24, [facility] on-call team was called regarding patient agitation and possible aspiration pneumonia.  [facility] RN went to assess patient and family requested patient be transferred to [facility] for diagnosis and treatment. Patient was diagnosed with aspiration pneumonia and is currently admitted to 1229 at [facility]. Per Dr. Patric Dykes of [facility], this is a related hospital admission.    MSW visited with patient, wife/[name] & daughter/Reena at bedside. Extensive education provided to family of [facility] services (palliative, home, etc). Family voice understanding. Currently, family are considering the following: Patient to discharge with [facility] Hospice services in the home w/ private caregivers Discharging to SNF for rehab w/ [facility] palliative services Discharging to alternative LTC facility w/ [facility] H[facility]ice MSW provided supportive presence at bedside as family is continuing to have GOC conversations. Spouse reports that their goal is for patient complete antibiotics before making final decision. Both spouse & daughter report that patient has improved overnight reporting that patient has gone from a high O2 requirement to not wearing any during MSW visit. [facility] HLT to continue to follow through hospitalization.   Patient is appropriate for inpatient level of care due ongoing medical evaluation for aspiration pneumonia and IV antibiotics.   V/S:   Vitals:   09/05/22 1000 09/05/22 1200  BP: 124/72 (!) 121/50  Pulse: 96 99  Resp: (!) 23 (!) 24  Temp:  98.7 F (37.1 C)  SpO2: 98% 97%   I&O:  Intake/Output Summary (Last 24 hours)  at 09/05/2022 1543 Last data filed at 09/05/2022 1200 Gross per 24 hour  Intake 600.11 ml  Output 600 ml  Net 0.11 ml   Abnormal lab work:   Latest Reference Range & Units 09/05/22 03:06  Glucose 70 - 99 mg/dL 161 (H)  BUN 8 - 23 mg/dL 33 (H)  NEUT# 1.7 - 7.7 K/uL 8.1 (H)  Lymphocyte # 0.7 - 4.0 K/uL 0.6 (L)   Diagnostics:.  None new to report.    IVs/PRNs:   Solu-Medrol 125MG /2 mL x2 Maxipime 2g x6 Flagyl x4 Vancomycin x2  Problem List per MD: Assessment/Plan Present on Admission:  End stage severe dementia  Aspiration pneumonia/ respiratory infection LLL -Pneumonia order set initiated -Blood cultures x 2 -Continue IV cefepime and vancomycin -Respiratory consult -Patient with nonrebreather -Nebulizers as needed -N.p.o., barium swallow    Elevated troponin -Doubt MI, more likely due to demand and infection -Will repeat troponin -No further workup   Mildly elevated potassium -Treating with IV fluids -BMP in a.m.    CAD (coronary artery disease)  Depression -Patient n.p.o., all p.o. medications on hold   D/C planning: Ongoing   Goals of Care: Patient current DNR.  Family would like to continue current care plan.   Communication with IDT: Updated.  Communication with family: Refer to above.    Please call with any hospice related questions/concerns.   Eugenie Birks, MSW [facility] Liaison [phone]

## 2022-09-05 NOTE — Evaluation (Signed)
Clinical/Bedside Swallow Evaluation Patient Details  Name: [name] MRN: [id number] Date of Birth: 01/07/1941  Today's Date: 09/05/2022 Time: SLP Start Time (ACUTE ONLY): 1201 SLP Stop Time (ACUTE ONLY): 1232 SLP Time Calculation (min) (ACUTE ONLY): 31 min  Past Medical History:  Past Medical History:  Diagnosis Date   Asthma    CAD (coronary artery disease)    Cancer (HCC) 2016   prostate   Complication of anesthesia    CO2 high according to patient    Family history of ovarian cancer    GERD (gastroesophageal reflux disease)    History of melanoma    History of prostate cancer    Hospice care patient 03/20/2021   HTN (hypertension)    Mitral valve prolapse    Osteoporosis    Severe sepsis (HCC) due to rhinovirus infection 03/13/2021   TIA (transient ischemic attack)    UTI (lower urinary tract infection)    Past Surgical History:  Past Surgical History:  Procedure Laterality Date   APPENDECTOMY     CORONARY ANGIOPLASTY WITH STENT PLACEMENT  04/30/2010   PCI and stenting of proximal RCA   MITRAL VALVE REPAIR  06/13/2010   right mini thoracotomy for complex valvuloplasty with 28mm Memo ring annuloplasty - Dr. [name]   NAILBED REPAIR Right 05/02/2015   Procedure: BIOPSY NAIL BED RIGHT THUMB;  Surgeon: Cindee Salt, MD;  Location: [facility];  Service: Orthopedics;  Laterality: Right;  ANESTHESIA:  [facility]   PROSTATECTOMY  2016   HPI:  Pt is an 82 yo male who is involved with hospice, from snf and adm with AMS, tachypnea and with breathing difficulties.  Imaging revealed LLL pna suspect asp, h/o prostate cancer and melanoma, GERD, tia, dementia, recurrently "chokes".  This SLP saw pt in December 2022, comfort po advised if desired - nectar possibly more comfortable.  No mbs as pt would not conduct d/t mentation. CT chest Chronic elevation of the right diaphragm. Progressive right lower lobe consolidation with occluded appearance of right lower lobe  bronchus. Secretions in trachea, suspect aspiration, Minimal clustered nodularity in the left lower lobe, initially responds to pain,  Today, patient still requiring about 6 L of O2, lethargic, unable to follow any commands, acutely ill-appearing with tachypnea. Family wants patient to be managed for aspiration pneumonia but may be open to hospice if pt does not rally within the next few days.  Swallow eval ordered.    Assessment / Plan / Recommendation  Clinical Impression  Pt known to this SLP from prior evaluation in 03/2021.   Today he is willing to consume po evidenced by pt opening mouth and manipulation when bolus placed up to his lips.  Congested breathing noted at baseline and pt did not follow directions to cough/clear his throat in attempts to clear.  PO boluses offered included ice, thin water, nectar juice, Italian ice and applesauce.  Pt with excessive lingual movement but delayed pharyngeal swallow clinically suspected. No overt indication of aspiration - coughing or throat clearing.  After a few bites/sips however pt demonstrated pursed lip breathing and appeared fatigued, therefore halted po intake.  At this time due to pt's positioning issues, mentation changes and current illness recommend to initiate a dys1/thin diet - use slighly thicker drinks during meals to decrease aspiration risk.  Tsps of thin at any time.  Advised this was a temporary measure while pt acutely ill to help mitigate aspiration risk.  Discussed again pt's chronicity of aspiration risk and  mitigations. Pt has fortuantely been in the hospital with pna "once a year" since 2022 and has maintained his weight - thus overall he has been managing this chronic aspiration.  Of note, Varda reports pt used to feed himself but largely requires feeding now - SLP Visit Diagnosis: Dysphagia, unspecified (R13.10)    Aspiration Risk  Moderate aspiration risk;Severe aspiration risk    Diet Recommendation Dysphagia 1 (Puree);Thin  liquid;Nectar-thick liquid   Liquid Administration via: Spoon;Cup;Straw Medication Administration: Crushed with puree Supervision: Staff to assist with self feeding Compensations: Slow rate;Small sips/bites;Other (Comment) (assure pt swallows)    Other  Recommendations Oral Care Recommendations: Oral care QID    Recommendations for follow up therapy are one component of a multi-disciplinary discharge planning process, led by the attending physician.  Recommendations may be updated based on patient status, additional functional criteria and insurance authorization.  Follow up Recommendations Follow physician's recommendations for discharge plan and follow up therapies      Assistance Recommended at Discharge    Functional Status Assessment Patient has had a recent decline in their functional status and demonstrates the ability to make significant improvements in function in a reasonable and predictable amount of time.  Frequency and Duration min 1 x/week  1 week       Prognosis Prognosis for improved oropharyngeal function: Fair Barriers to Reach Goals: Time post onset;Severity of deficits;Cognitive deficits;Other (Comment) (reliance on others for feeding)      Swallow Study   General Date of Onset: 09/05/22 HPI: Pt is an 82 yo male who is involved with hospice, from snf and adm with AMS, tachypnea and with breathing difficulties.  Imaging revealed LLL pna suspect asp, h/o prostate cancer and melanoma, GERD, tia, dementia, recurrently "chokes".  This SLP saw pt in December 2022, comfort po advised if desired - nectar possibly more comfortable.  No mbs as pt would not conduct d/t mentation. CT chest Chronic elevation of the right diaphragm. Progressive right lower lobe consolidation with occluded appearance of right lower lobe bronchus. Secretions in trachea, suspect aspiration, Minimal clustered nodularity in the left lower lobe, initially responds to pain,  Today, patient still requiring  about 6 L of O2, lethargic, unable to follow any commands, acutely ill-appearing with tachypnea. Family wants patient to be managed for aspiration pneumonia but may be open to hospice if pt does not rally within the next few days.  Swallow eval ordered. Type of Study: Bedside Swallow Evaluation Diet Prior to this Study: NPO Temperature Spikes Noted: No Respiratory Status: Room air History of Recent Intubation: No Behavior/Cognition: Doesn't follow directions;Confused;Other (Comment);Lethargic/Drowsy (pt mentation waxes and wanes throughout session) Oral Cavity Assessment: Other (comment) (pt did not open mouth adequately to view) Oral Cavity - Dentition: Adequate natural dentition Vision: Functional for self-feeding Self-Feeding Abilities: Total assist Patient Positioning: Partially reclined (pt slides down in bed within minues of being positioned upright) Baseline Vocal Quality: Low vocal intensity (but clear) Volitional Cough: Cognitively unable to elicit Volitional Swallow: Unable to elicit    Oral/Motor/Sensory Function Overall Oral Motor/Sensory Function: Generalized oral weakness   Ice Chips Ice chips: Within functional limits Presentation: Spoon Oral Phase Functional Implications: Other (comment);Prolonged oral transit Pharyngeal Phase Impairments: Suspected delayed Swallow Other Comments: single small ice chips   Thin Liquid Thin Liquid: Impaired Presentation: Cup;Spoon;Straw Oral Phase Impairments: Reduced lingual movement/coordination Oral Phase Functional Implications: Prolonged oral transit Pharyngeal  Phase Impairments: Suspected delayed Swallow    Nectar Thick Nectar Thick Liquid: Impaired Presentation: Cup;Spoon;Straw Oral Phase Impairments:  Reduced lingual movement/coordination Oral phase functional implications: Prolonged oral transit Pharyngeal Phase Impairments: Suspected delayed Swallow   Honey Thick Honey Thick Liquid: Not tested   Puree Puree:  Impaired Presentation: Spoon Pharyngeal Phase Impairments: Suspected delayed Swallow   Solid     Solid: Not tested Other Comments: DNT due to pt's limited mouth opening and current mentation, illness- wife was agreeable      Chales Abrahams 09/05/2022,2:41 PM  Rolena Infante, MS [facility] SLP Acute Rehab Services Office [phone]

## 2022-09-05 NOTE — Evaluation (Signed)
SLP Cancellation Note  Patient Details Name: [name] MRN: [id number] DOB: 27-Jan-1941   Cancelled treatment:       Reason Eval/Treat Not Completed: Other (comment) (pt getting cleaned up)   Chales Abrahams 09/05/2022, 9:43 AM  Rolena Infante, MS [facility] SLP Acute Rehab Services Office [phone]

## 2022-09-05 NOTE — Progress Notes (Signed)
Speech Language Pathology Treatment: Dysphagia  Patient Details Name: [name] MRN: [id number] DOB: 1941/03/14 Today's Date: 09/05/2022 Time: 4098-1191 SLP Time Calculation (min) (ACUTE ONLY): 10 min  Assessment / Plan / Recommendation Clinical Impression  SLP arrived to post swallow precautions signs and symptoms present.  He inquired as to patient's swallowing function, diet recommendations and precautions.  SLP reviewed with clinical reasoning for recommendations as well as reviewed compensation this listed on swallow precautions sign.  Advised that at this time due to patient's mentation, poor positioning, pneumonia, and likely exacerbation of baseline dysphagia, modified diet advised to maximize comfort and safety.  Anticipate patient's diet will be able to advance as medically improves.  Patient's daughter [name] asked that SLP call her following evaluation.  SLP called her at [phone] and left a phone message detailing information and recommendations.  Also left her contact number to call this therapist if she has questions or concerns.  SLP will follow-up for dietary advancement as patient appropriate.  Related to patient's chronicity of aspiration risk but being thankful that she has maintained her weight and has only had pneumonia once a year since December 2022.  Therefore fortunately he appears to be managing his chronic dysphagia and aspiration.    HPI HPI: Pt is an 82 yo male who is involved with hospice, from snf and adm with AMS, tachypnea and with breathing difficulties.  Imaging revealed LLL pna suspect asp, h/o prostate cancer and melanoma, GERD, tia, dementia, recurrently "chokes".  This SLP saw pt in December 2022, comfort po advised if desired - nectar possibly more comfortable.  No mbs as pt would not conduct d/t mentation. CT chest Chronic elevation of the right diaphragm. Progressive right lower lobe consolidation with occluded appearance of right lower lobe  bronchus. Secretions in trachea, suspect aspiration, Minimal clustered nodularity in the left lower lobe, initially responds to pain,  Today, patient still requiring about 6 L of O2, lethargic, unable to follow any commands, acutely ill-appearing with tachypnea. Family wants patient to be managed for aspiration pneumonia but may be open to hospice if pt does not rally within the next few days.  Swallow eval ordered.      SLP Plan  Continue with current plan of care      Recommendations for follow up therapy are one component of a multi-disciplinary discharge planning process, led by the attending physician.  Recommendations may be updated based on patient status, additional functional criteria and insurance authorization.    Recommendations  Diet recommendations: Dysphagia 1 (puree);Thin liquid;Nectar-thick liquid Liquids provided via: Cup;Straw;Teaspoon Medication Administration: Crushed with puree Supervision: Staff to assist with self feeding;Trained caregiver to feed patient Compensations: Slow rate;Small sips/bites;Other (Comment) Postural Changes and/or Swallow Maneuvers: Seated upright 90 degrees;Upright 30-60 min after meal    Ok for family to bring in food from home that they can puree for him.                Oral care QID     Dysphagia, unspecified (R13.10)     Continue with current plan of care    Rolena Infante, MS [facility] SLP Acute Rehab Services Office [phone]  Chales Abrahams  09/05/2022, 2:52 PM

## 2022-09-05 NOTE — Progress Notes (Signed)
PROGRESS NOTE  GHALI HUFFMAN ZOX:[id number] DOB: 07/08/1940 DOA: 09/03/2022 PCP: Eloisa Northern, MD  HPI/Recap of past 57 hours: 82 year old male with advanced dementia, CAD s/p PCI >5 yrs, prostate cancer, asthma, HTN, h/o MVR, h/o TIA, and subungual melanoma.  Patient is a resident of a nursing home.  He is total care.  He does not speak much and when he does it is incomprehensible.  He is nonambulatory.  Patient does eat well when fed. Patient was found by family having trouble breathing.  He was noted to be gasping for air, had a fever, and wheezing.  Patient was hypoxic, unclear how low.  Patient is currently being followed by hospice but nursing home was having difficulty getting chest x-rays or starting antibiotics. His wife asked for him to be transferred to the ER. In the ER patient is poorly responsive.  His oxygen sats was in the 80s.  He was placed on a NR, satting 100%.  Patient admitted for further management.   Today, patient noted to be improved, currently saturating well on room air.  Baseline with advanced dementia.  Discussed extensively with daughter at bedside.  Wife in room but noted to be sleeping   Assessment/Plan: Principal Problem:   Acute respiratory failure with hypoxia (HCC) Active Problems:   CARDIAC MURMUR, SYSTOLIC   CAD (coronary artery disease)   Goals of care, counseling/discussion   Acute hypoxic respiratory failure Likely aspiration pneumonia Sepsis present on admission (tachypneic, tachycardic) History of asthma Currently afebrile, with resolved leukocytosis Currently on room air Blood cultures x 2 NGTD Urine strep pneumo negative Continue DuoNebs, Pulmicort, Brovana Continue IV cefepime, Flagyl, discontinue vancomycin, plan to de-escalate to Unasyn on 5/24 pending clinical status Gave a dose of Lasix on 5/22, monitor for further needs Continue IV Solu-Medrol Supplemental O2, nonrebreather as needed SLP consulted, appreciate recs, dysphagia 1  pure, nectar thick liquid, aspiration precautions  Elevated troponin History of CAD Doubt MI, more likely due to demand and infection Trending down slowly, 132-125-103 EKG with no acute ST changes Cardiologist, Dr. Jacinto Halim was consulted by family members, no further workup, recommended hospice Telemetry  Hypertension BP stable  Advanced dementia Goals of care discussion Had extensive discussion with daughter at bedside Palliative consulted for further goals of care discussion       Estimated body mass index is 24.07 kg/m as calculated from the following:   Height as of this encounter: 5\' 8"  (1.727 m).   Weight as of this encounter: 71.8 kg.     Code Status: DNR  Family Communication: Discussed with daughter at bedside  Disposition Plan: Status is: Inpatient Remains inpatient appropriate because: Level of care      Consultants: Cardiology, Dr. Jacinto Halim  Procedures: None  Antimicrobials: Cefepime Flagyl  DVT prophylaxis: Lovenox   Objective: Vitals:   09/05/22 0800 09/05/22 0900 09/05/22 1000 09/05/22 1200  BP: 94/70 136/72 124/72 (!) 121/50  Pulse: 86 97 96 99  Resp: (!) 32 (!) 23 (!) 23 (!) 24  Temp: 98.4 F (36.9 C)   98.7 F (37.1 C)  TempSrc: Axillary   Axillary  SpO2: 97% 97% 98% 97%  Weight:      Height:        Intake/Output Summary (Last 24 hours) at 09/05/2022 1623 Last data filed at 09/05/2022 1200 Gross per 24 hour  Intake 600.11 ml  Output 600 ml  Net 0.11 ml   Filed Weights   09/04/22 0240  Weight: 71.8 kg    Exam: General:  NAD, nonpurposeful movements Cardiovascular: S1, S2 present Respiratory: Coarse breath sounds Abdomen: Soft, nontender, nondistended, bowel sounds present Musculoskeletal: No bilateral pedal edema noted Skin: Normal Psychiatry: Unable to assess    Data Reviewed: CBC: Recent Labs  Lab 09/03/22 2003 09/03/22 2125 09/04/22 0318 09/05/22 0306  WBC 12.5*  --  10.6* 9.4  NEUTROABS 9.0*  --  7.3 8.1*   HGB 14.9 15.0 14.8 15.1  HCT 45.0 44.0 44.4 45.7  MCV 96.8  --  96.9 97.9  PLT 167  --  172 192   Basic Metabolic Panel: Recent Labs  Lab 09/03/22 2003 09/03/22 2125 09/04/22 0318 09/05/22 0306  NA 136 136 137 140  K 4.1 5.3* 3.8 4.4  CL 101 102 101 102  CO2 29  --  30 28  GLUCOSE 123* 118* 122* 150*  BUN 30* 42* 26* 33*  CREATININE 1.02 1.00 1.00 1.18  CALCIUM 8.9  --  8.9 8.9  MG  --   --  1.8  --    GFR: Estimated Creatinine Clearance: 47.5 mL/min (by C-G formula based on SCr of 1.18 mg/dL). Liver Function Tests: Recent Labs  Lab 09/03/22 2003  AST 26  ALT 21  ALKPHOS 69  BILITOT 0.8  PROT 7.3  ALBUMIN 3.6   No results for input(s): "LIPASE", "AMYLASE" in the last 168 hours. No results for input(s): "AMMONIA" in the last 168 hours. Coagulation Profile: No results for input(s): "INR", "PROTIME" in the last 168 hours. Cardiac Enzymes: No results for input(s): "CKTOTAL", "CKMB", "CKMBINDEX", "TROPONINI" in the last 168 hours. BNP (last 3 results) No results for input(s): "PROBNP" in the last 8760 hours. HbA1C: No results for input(s): "HGBA1C" in the last 72 hours. CBG: No results for input(s): "GLUCAP" in the last 168 hours. Lipid Profile: No results for input(s): "CHOL", "HDL", "LDLCALC", "TRIG", "CHOLHDL", "LDLDIRECT" in the last 72 hours. Thyroid Function Tests: No results for input(s): "TSH", "T4TOTAL", "FREET4", "T3FREE", "THYROIDAB" in the last 72 hours. Anemia Panel: No results for input(s): "VITAMINB12", "FOLATE", "FERRITIN", "TIBC", "IRON", "RETICCTPCT" in the last 72 hours. Urine analysis:    Component Value Date/Time   COLORURINE YELLOW 02/02/2022 0428   APPEARANCEUR CLEAR 02/02/2022 0428   LABSPEC 1.020 02/02/2022 0428   PHURINE 6.5 02/02/2022 0428   GLUCOSEU NEGATIVE 02/02/2022 0428   HGBUR NEGATIVE 02/02/2022 0428   BILIRUBINUR NEGATIVE 02/02/2022 0428   KETONESUR NEGATIVE 02/02/2022 0428   PROTEINUR 30 (A) 02/02/2022 0428    UROBILINOGEN 0.2 07/15/2013 2107   NITRITE NEGATIVE 02/02/2022 0428   LEUKOCYTESUR NEGATIVE 02/02/2022 0428   Sepsis Labs: @LABRCNTIP (procalcitonin:4,lacticidven:4)  ) Recent Results (from the past 240 hour(s))  Blood culture (routine x 2)     Status: None (Preliminary result)   Collection Time: 09/03/22  9:15 PM   Specimen: BLOOD LEFT ARM  Result Value Ref Range Status   Specimen Description   Final    BLOOD LEFT ARM Performed at [facility], [address], [place], [place] [address]    Special Requests   Final    BOTTLES DRAWN AEROBIC AND ANAEROBIC Blood Culture adequate volume Performed at [facility], [address] [place], [place] [address]    Culture   Final    NO GROWTH 2 DAYS Performed at [facility], [address], [place], [place] [address]    Report Status PENDING  Incomplete  Blood culture (routine x 2)     Status: None (Preliminary result)   Collection Time: 09/03/22  9:26 PM   Specimen: BLOOD RIGHT FOREARM  Result Value Ref Range Status   Specimen Description   Final    BLOOD RIGHT FOREARM Performed at [facility], [address] [place], [place] [address]    Special Requests   Final    BOTTLES DRAWN AEROBIC AND ANAEROBIC Blood Culture results may not be optimal due to an inadequate volume of blood received in culture bottles Performed at [facility], [address] [place], [place] [address]    Culture   Final    NO GROWTH 2 DAYS Performed at [facility], [address] [place], [place] [address]    Report Status PENDING  Incomplete  MRSA Next Gen by PCR, Nasal     Status: None   Collection Time: 09/04/22  2:34 AM   Specimen: Nasal Mucosa; Nasal Swab  Result Value Ref Range Status   MRSA by PCR Next Gen NOT DETECTED NOT DETECTED Final    Comment: (NOTE) The GeneXpert MRSA Assay (FDA approved for NASAL specimens only), is one component of a comprehensive MRSA colonization  surveillance program. It is not intended to diagnose MRSA infection nor to guide or monitor treatment for MRSA infections. Test performance is not FDA approved in patients less than 29 years old. Performed at [facility], [address] [place], [place] [address]       Studies: No results found.  Scheduled Meds:  arformoterol  15 mcg Nebulization BID   budesonide (PULMICORT) nebulizer solution  0.25 mg Nebulization BID   Chlorhexidine Gluconate Cloth  6 each Topical Daily   enoxaparin (LOVENOX) injection  40 mg Subcutaneous Q24H   ipratropium-albuterol  3 mL Nebulization BID   methylPREDNISolone (SOLU-MEDROL) injection  60 mg Intravenous Q12H    Continuous Infusions:  ceFEPime (MAXIPIME) IV Stopped (09/05/22 1202)   metronidazole Stopped (09/05/22 1000)   vancomycin Stopped (09/05/22 0006)     LOS: 1 day     Briant Cedar, MD [facility]  If 7PM-7AM, please contact night-coverage [email or web address] 09/05/2022, 4:23 PM

## 2022-09-05 NOTE — Progress Notes (Addendum)
  Transition of Care [facility]) Screening Note   Patient Details  Name: [name] Date of Birth: Nov 28, 1940   Transition of Care [facility]) CM/SW Contact:    Lavenia Atlas, RN Phone Number: 09/05/2022, 4:21 PM  Per chart review patient has home hospice with [facility], resides at [facility]. Patient is end stage severe dementia. Family having continued goals of care conversations, potential for family to revoke hospice services.  Transition of Care Department [facility]) has reviewed patient and no TOC needs have been identified at this time. We will continue to monitor patient advancement through interdisciplinary progression rounds. If new patient transition needs arise, please place a TOC consult.

## 2022-09-05 NOTE — Progress Notes (Signed)
Family, spouse and daughter Con Memos is at bedside and is expressing their need to take this patient home upon discharge, will follow up in the a.m. to have rounding appropriate discipline to phone patients daughter Con Memos.

## 2022-09-06 DIAGNOSIS — T17908A Unspecified foreign body in respiratory tract, part unspecified causing other injury, initial encounter: Secondary | ICD-10-CM

## 2022-09-06 DIAGNOSIS — Z515 Encounter for palliative care: Secondary | ICD-10-CM

## 2022-09-06 DIAGNOSIS — Z7189 Other specified counseling: Secondary | ICD-10-CM

## 2022-09-06 DIAGNOSIS — F419 Anxiety disorder, unspecified: Secondary | ICD-10-CM

## 2022-09-06 DIAGNOSIS — T17908S Unspecified foreign body in respiratory tract, part unspecified causing other injury, sequela: Secondary | ICD-10-CM

## 2022-09-06 DIAGNOSIS — J189 Pneumonia, unspecified organism: Secondary | ICD-10-CM

## 2022-09-06 DIAGNOSIS — F02C11 Dementia in other diseases classified elsewhere, severe, with agitation: Secondary | ICD-10-CM

## 2022-09-06 DIAGNOSIS — G309 Alzheimer's disease, unspecified: Secondary | ICD-10-CM

## 2022-09-06 DIAGNOSIS — Z79899 Other long term (current) drug therapy: Secondary | ICD-10-CM | POA: Diagnosis not present

## 2022-09-06 DIAGNOSIS — J9601 Acute respiratory failure with hypoxia: Secondary | ICD-10-CM | POA: Diagnosis not present

## 2022-09-06 DIAGNOSIS — R4589 Other symptoms and signs involving emotional state: Secondary | ICD-10-CM

## 2022-09-06 LAB — CULTURE, BLOOD (ROUTINE X 2): Special Requests: ADEQUATE

## 2022-09-06 LAB — CBC WITH DIFFERENTIAL/PLATELET
Abs Immature Granulocytes: 0.1 10*3/uL — ABNORMAL HIGH (ref 0.00–0.07)
Basophils Absolute: 0 10*3/uL (ref 0.0–0.1)
Basophils Relative: 0 %
Eosinophils Absolute: 0 10*3/uL (ref 0.0–0.5)
Eosinophils Relative: 0 %
HCT: 44.3 % (ref 39.0–52.0)
Hemoglobin: 14.3 g/dL (ref 13.0–17.0)
Immature Granulocytes: 1 %
Lymphocytes Relative: 5 %
Lymphs Abs: 0.6 10*3/uL — ABNORMAL LOW (ref 0.7–4.0)
MCH: 31.2 pg (ref 26.0–34.0)
MCHC: 32.3 g/dL (ref 30.0–36.0)
MCV: 96.7 fL (ref 80.0–100.0)
Monocytes Absolute: 0.9 10*3/uL (ref 0.1–1.0)
Monocytes Relative: 8 %
Neutro Abs: 10.2 10*3/uL — ABNORMAL HIGH (ref 1.7–7.7)
Neutrophils Relative %: 86 %
Platelets: 207 10*3/uL (ref 150–400)
RBC: 4.58 MIL/uL (ref 4.22–5.81)
RDW: 13.1 % (ref 11.5–15.5)
WBC: 11.9 10*3/uL — ABNORMAL HIGH (ref 4.0–10.5)
nRBC: 0 % (ref 0.0–0.2)

## 2022-09-06 LAB — BASIC METABOLIC PANEL
Anion gap: 9 (ref 5–15)
BUN: 47 mg/dL — ABNORMAL HIGH (ref 8–23)
CO2: 27 mmol/L (ref 22–32)
Calcium: 9.3 mg/dL (ref 8.9–10.3)
Chloride: 107 mmol/L (ref 98–111)
Creatinine, Ser: 1 mg/dL (ref 0.61–1.24)
GFR, Estimated: >60 mL/min (ref >60)
Glucose, Bld: 145 mg/dL — ABNORMAL HIGH (ref 70–99)
Potassium: 3.9 mmol/L (ref 3.5–5.1)
Sodium: 143 mmol/L (ref 135–145)

## 2022-09-06 LAB — LEGIONELLA PNEUMOPHILA SEROGP 1 UR AG: L. pneumophila Serogp 1 Ur Ag: NEGATIVE

## 2022-09-06 MED ORDER — ESCITALOPRAM OXALATE 5 MG/5ML PO SOLN
20.0000 mg | Freq: Every day | ORAL | Status: DC
  Administered 2022-09-06 – 2022-09-08 (×3): 20 mg via ORAL
  Filled 2022-09-06 (×3): qty 20

## 2022-09-06 MED ORDER — GLYCOPYRROLATE 1 MG PO TABS
1.0000 mg | ORAL_TABLET | ORAL | Status: DC | PRN
  Administered 2022-09-07 – 2022-09-08 (×3): 1 mg via ORAL
  Filled 2022-09-06 (×6): qty 1

## 2022-09-06 MED ORDER — HALOPERIDOL LACTATE 2 MG/ML PO CONC: 0.5000 mg | ORAL | Status: DC | PRN

## 2022-09-06 MED ORDER — ONDANSETRON 4 MG PO TBDP: 4.0000 mg | ORAL_TABLET | Freq: Four times a day (QID) | ORAL | Status: DC | PRN

## 2022-09-06 MED ORDER — MORPHINE SULFATE (PF) 2 MG/ML IV SOLN
2.0000 mg | INTRAVENOUS | Status: DC | PRN
  Administered 2022-09-07: 2 mg via INTRAVENOUS
  Filled 2022-09-06: qty 1

## 2022-09-06 MED ORDER — MORPHINE SULFATE (PF) 2 MG/ML IV SOLN: 2.0000 mg | INTRAVENOUS | Status: DC | PRN

## 2022-09-06 MED ORDER — GLYCOPYRROLATE 0.2 MG/ML IJ SOLN
0.2000 mg | INTRAMUSCULAR | Status: DC | PRN
  Filled 2022-09-06: qty 1

## 2022-09-06 MED ORDER — ESCITALOPRAM OXALATE 5 MG/5ML PO SOLN: 5.0000 mg | Freq: Every day | ORAL | Status: DC

## 2022-09-06 MED ORDER — LORAZEPAM 2 MG/ML PO CONC: 0.5000 mg | ORAL | Status: DC | PRN

## 2022-09-06 MED ORDER — SODIUM CHLORIDE 0.9 % IV SOLN
1.0000 g | INTRAVENOUS | Status: DC
  Administered 2022-09-06: 1 g via INTRAVENOUS
  Filled 2022-09-06: qty 10

## 2022-09-06 MED ORDER — MORPHINE SULFATE 10 MG/5ML PO SOLN: 5.0000 mg | ORAL | Status: DC | PRN

## 2022-09-06 MED ORDER — POLYVINYL ALCOHOL 1.4 % OP SOLN: 1.0000 [drp] | Freq: Four times a day (QID) | OPHTHALMIC | Status: DC | PRN

## 2022-09-06 MED ORDER — MORPHINE SULFATE (CONCENTRATE) 10 MG/0.5ML PO SOLN
5.0000 mg | ORAL | Status: DC | PRN
  Administered 2022-09-06: 5 mg via ORAL
  Filled 2022-09-06: qty 0.5

## 2022-09-06 MED ORDER — ACETAMINOPHEN 650 MG RE SUPP: 650.0000 mg | Freq: Four times a day (QID) | RECTAL | Status: DC | PRN

## 2022-09-06 MED ORDER — HALOPERIDOL 0.5 MG PO TABS
0.5000 mg | ORAL_TABLET | ORAL | Status: DC | PRN
  Administered 2022-09-07: 0.5 mg via ORAL
  Filled 2022-09-06 (×3): qty 1

## 2022-09-06 MED ORDER — ONDANSETRON HCL 4 MG/2ML IJ SOLN: 4.0000 mg | Freq: Four times a day (QID) | INTRAMUSCULAR | Status: DC | PRN

## 2022-09-06 MED ORDER — HALOPERIDOL LACTATE 5 MG/ML IJ SOLN: 0.5000 mg | INTRAMUSCULAR | Status: DC | PRN

## 2022-09-06 MED ORDER — BIOTENE DRY MOUTH MT LIQD
15.0000 mL | OROMUCOSAL | Status: DC | PRN
Start: 2022-09-06 — End: 2022-09-06

## 2022-09-06 MED ORDER — PREDNISONE 20 MG PO TABS
40.0000 mg | ORAL_TABLET | Freq: Every day | ORAL | Status: DC
  Administered 2022-09-07 – 2022-09-08 (×2): 40 mg via ORAL
  Filled 2022-09-06 (×2): qty 2

## 2022-09-06 NOTE — Progress Notes (Signed)
[facility]) Hospitalized Hospice Patient Visit   Mr. Fikes is a hospitalized hospice patient admitted to [facility] services in December of 2023 with a terminal hospice diagnosis of other symptoms and signs involving cognitive functions following other cerebrovascular disease and a secondary diagnosis of vascular dementia, unspecified severity, with mood disturbance. On 5.21.24, [facility] on-call team was called regarding patient agitation and possible aspiration pneumonia.  ACC RN went to assess patient and family requested patient be transferred to [facility] for diagnosis and treatment. Patient was diagnosed with aspiration pneumonia and is currently admitted to 1229 at [facility]. Per Dr. Patric Dykes of [facility], this is a related hospital admission.    Rounded with patient in room, then met with wife, daughter Con Memos in person and daughter Jearl Klinefelter and son-in-law via phone. Discussed benefit of transition to hospice at home versus hospice continuing at LTC patient was in prior to admission.  Patient's daughter given a list of home health agencies in the area available for private caregivers at home.  Family to look into cost and availability of private caregivers and weigh feasibility of bringing patient home at discharge.  Per family, patient has bed and overbed table already in place at home at discharge.  Family would request oxygen to be delivered for comfort despite discharge destination.       V/S:  99.8, 83, 22, 101/48, 96% sats on RA I&O: 1190/1400 Abnormal lab work:  WBC 11.9, BUN 47, Troponin 103, Glucose 145, B Natriuretic Peptide 444.2 Diagnostics:. None   IVs/PRNs:  Morphine 10mg /0.46mL oral solution 5mg  x 1  Problem List per MD: Assessment/Plan: Principal Problem:   Acute respiratory failure with hypoxia (HCC) Active Problems:   CARDIAC MURMUR, SYSTOLIC   CAD (coronary artery disease)   Goals of care, counseling/discussion   Acute hypoxic respiratory failure Likely  aspiration pneumonia Sepsis present on admission (tachypneic, tachycardic) History of asthma Currently afebrile, with resolved leukocytosis Currently on room air Blood cultures x 2 NGTD Urine strep pneumo negative Continue DuoNebs, Pulmicort, Brovana Continue IV cefepime, Flagyl, discontinue vancomycin, plan to de-escalate to Unasyn on 5/24 pending clinical status Gave a dose of Lasix on 5/22, monitor for further needs Continue IV Solu-Medrol Supplemental O2, nonrebreather as needed SLP consulted, appreciate recs, dysphagia 1 pure, nectar thick liquid, aspiration precautions   Elevated troponin History of CAD Doubt MI, more likely due to demand and infection Trending down slowly, 132-125-103 EKG with no acute ST changes Cardiologist, Dr. Jacinto Halim was consulted by family members, no further workup, recommended hospice Telemetry   Hypertension BP stable   Advanced dementia Goals of care discussion Had extensive discussion with daughter at bedside Palliative consulted for further goals of care discussion  D/C planning- Ongoing, family deciding on discharge to home or to LTC, would like hospice involved at discharge    Goals of Care: Patient current DNR, transition to comfort measures only today   Communication with IDT- ACC team updated on patient condition.  Communication with PCG - Family updated at bedside.   Please call with any hospice related questions/concerns.  Doreatha Martin, RN, BSN [facility] Liaison [phone]

## 2022-09-06 NOTE — Progress Notes (Signed)
OT Cancellation Note  Patient Details Name: [name] MRN: [id number] DOB: [birth year]   Cancelled Treatment:     OT spoke with the pt's nurse, Jaclynn Guarneri who stated the pt and his family have decided to pursue comfort care. As such, no therapy needs are warranted at current.     [name], OTR/L 09/06/2022, 2:53 PM

## 2022-09-06 NOTE — Progress Notes (Addendum)
Pharmacy Antibiotic Note  [name] is a 82 y.o. male admitted on 09/03/2022 with pneumonia.  Pharmacy has been consulted for Cefepime dosing.  ID: PNA, aspiration on CT - Afebrile, WBC 11.9 back up (Vanco d/c'd 5/23), Scr 1  Antimicrobials this admission: 5/21 Vancomycin >>  5/23 5/21 Cefepime >>   5/21: Flagyl>>  Microbiology results: 5/21 BCx:  NGTD 5/21 MRSA PCR:  not detected  Plan: Continue Cefepime 2gm IV q12h. Dose ok for CrCl<60. Vs narrow to Rocephin alone Pharmacy will sign off. Please reconsult for further dosing assitance.     Height: 5\' 8"  (172.7 cm) Weight: 71.8 kg (158 lb 4.6 oz) IBW/kg (Calculated) : 68.4  Temp (24hrs), Avg:98.4 F (36.9 C), Min:97.5 F (36.4 C), Max:98.9 F (37.2 C)  Recent Labs  Lab 09/03/22 2003 09/03/22 2125 09/04/22 0058 09/04/22 0318 09/05/22 0306 09/06/22 0259  WBC 12.5*  --   --  10.6* 9.4 11.9*  CREATININE 1.02 1.00  --  1.00 1.18 1.00  LATICACIDVEN 1.3  --  0.8  --   --   --     Estimated Creatinine Clearance: 56.1 mL/min (by C-G formula based on SCr of 1 mg/dL).    Allergies  Allergen Reactions   Aricept [Donepezil]     Upset stomach   Ativan [Lorazepam]     aggigation   Depakote [Divalproex Sodium]     Made me feel like a different person   Penicillins Itching and Rash    Has patient had a PCN reaction causing immediate rash, facial/tongue/throat swelling, SOB or lightheadedness with hypotension: YES Has patient had a PCN reaction causing severe rash involving mucus membranes or skin necrosis: NO Has patient had a PCN reaction that required hospitalization: NO Has patient had a PCN reaction occurring within the last 10 years: NO If all of the above answers are "NO", then may proceed with Cephalosporin use.    [name], PharmD, BCPS Clinical Staff Pharmacist [email or web address]  [name] 09/06/2022 8:39 AM

## 2022-09-06 NOTE — Progress Notes (Signed)
PROGRESS NOTE  [name] ION:[id number] DOB: [birth year] DOA: 09/03/2022 PCP: Eloisa Northern, MD  HPI/Recap of past 33 hours: 82 year old male with advanced dementia, CAD s/p PCI >5 yrs, prostate cancer, asthma, HTN, h/o MVR, h/o TIA, and subungual melanoma.  Patient is a resident of a nursing home.  He is total care.  He does not speak much and when he does it is incomprehensible.  He is nonambulatory.  Patient does eat well when fed. Patient was found by family having trouble breathing.  He was noted to be gasping for air, had a fever, and wheezing.  Patient was hypoxic, unclear how low.  Patient is currently being followed by hospice but nursing home was having difficulty getting chest x-rays or starting antibiotics. His wife asked for him to be transferred to the ER. In the ER patient is poorly responsive.  His oxygen sats was in the 80s.  He was placed on a NR, satting 100%.  Patient admitted for further management.    Today, patient and family had extensive conversation with hospice/palliative team and decision was to switch to comfort care/hospice were made.  Family to decide home hospice versus residential hospice.  Patient continues to remain off O2, noted to be agitated.  Family requests liquid Lexapro as patient is allergic to Ativan.     Assessment/Plan: Principal Problem:   Acute respiratory failure with hypoxia (HCC) Active Problems:   CARDIAC MURMUR, SYSTOLIC   CAD (coronary artery disease)   Goals of care, counseling/discussion   High risk medication use   Anxiety   Need for emotional support   Pneumonia due to infectious organism   Aspiration into airway   Severe Alzheimer's dementia with agitation (HCC)   Counseling and coordination of care   Acute hypoxic respiratory failure Likely aspiration pneumonia Sepsis present on admission (tachypneic, tachycardic) History of asthma Currently afebrile, with resolved leukocytosis Currently on room air Blood cultures  x 2 NGTD Urine strep pneumo negative Switch to full comfort care Supplemental O2 prn SLP consulted, appreciate recs, dysphagia 1 pure, nectar thick liquid, aspiration precautions/comfort feeds  Elevated troponin History of CAD Doubt MI, more likely due to demand and infection EKG with no acute ST changes Cardiologist, Dr. Jacinto Halim was consulted by family members, no further workup, recommended hospice  Hypertension BP stable  Advanced dementia Goals of care discussion Palliative consulted for further goals of care discussion-switch to comfort care       Estimated body mass index is 24.07 kg/m as calculated from the following:   Height as of this encounter: 5\' 8"  (1.727 m).   Weight as of this encounter: 71.8 kg.     Code Status: DNR  Family Communication: Discussed with daughter at bedside  Disposition Plan: Status is: Inpatient Remains inpatient appropriate because: Level of care      Consultants: Cardiology, Dr. Jacinto Halim Palliative/hospice  Procedures: None  Antimicrobials: None  DVT prophylaxis: SCDs   Objective: Vitals:   09/06/22 1100 09/06/22 1200 09/06/22 1500 09/06/22 1600  BP:  115/88 (!) 108/54 (!) 101/48  Pulse:  86 87 83  Resp:  (!) 26 (!) 26 (!) 22  Temp: 99.8 F (37.7 C)     TempSrc: Axillary     SpO2:  95% 96% 96%  Weight:      Height:        Intake/Output Summary (Last 24 hours) at 09/06/2022 1833 Last data filed at 09/06/2022 1340 Gross per 24 hour  Intake --  Output 500 ml  Net -500 ml   Filed Weights   09/04/22 0240  Weight: 71.8 kg    Exam: General: NAD, nonpurposeful movements Cardiovascular: S1, S2 present Respiratory: Coarse breath sounds Abdomen: Soft, nontender, nondistended, bowel sounds present Musculoskeletal: No bilateral pedal edema noted Skin: Normal Psychiatry: Unable to assess    Data Reviewed: CBC: Recent Labs  Lab 09/03/22 2003 09/03/22 2125 09/04/22 0318 09/05/22 0306 09/06/22 0259  WBC  12.5*  --  10.6* 9.4 11.9*  NEUTROABS 9.0*  --  7.3 8.1* 10.2*  HGB 14.9 15.0 14.8 15.1 14.3  HCT 45.0 44.0 44.4 45.7 44.3  MCV 96.8  --  96.9 97.9 96.7  PLT 167  --  172 192 207   Basic Metabolic Panel: Recent Labs  Lab 09/03/22 2003 09/03/22 2125 09/04/22 0318 09/05/22 0306 09/06/22 0259  NA 136 136 137 140 143  K 4.1 5.3* 3.8 4.4 3.9  CL 101 102 101 102 107  CO2 29  --  30 28 27   GLUCOSE 123* 118* 122* 150* 145*  BUN 30* 42* 26* 33* 47*  CREATININE 1.02 1.00 1.00 1.18 1.00  CALCIUM 8.9  --  8.9 8.9 9.3  MG  --   --  1.8  --   --    GFR: Estimated Creatinine Clearance: 56.1 mL/min (by C-G formula based on SCr of 1 mg/dL). Liver Function Tests: Recent Labs  Lab 09/03/22 2003  AST 26  ALT 21  ALKPHOS 69  BILITOT 0.8  PROT 7.3  ALBUMIN 3.6   No results for input(s): "LIPASE", "AMYLASE" in the last 168 hours. No results for input(s): "AMMONIA" in the last 168 hours. Coagulation Profile: No results for input(s): "INR", "PROTIME" in the last 168 hours. Cardiac Enzymes: No results for input(s): "CKTOTAL", "CKMB", "CKMBINDEX", "TROPONINI" in the last 168 hours. BNP (last 3 results) No results for input(s): "PROBNP" in the last 8760 hours. HbA1C: No results for input(s): "HGBA1C" in the last 72 hours. CBG: No results for input(s): "GLUCAP" in the last 168 hours. Lipid Profile: No results for input(s): "CHOL", "HDL", "LDLCALC", "TRIG", "CHOLHDL", "LDLDIRECT" in the last 72 hours. Thyroid Function Tests: No results for input(s): "TSH", "T4TOTAL", "FREET4", "T3FREE", "THYROIDAB" in the last 72 hours. Anemia Panel: No results for input(s): "VITAMINB12", "FOLATE", "FERRITIN", "TIBC", "IRON", "RETICCTPCT" in the last 72 hours. Urine analysis:    Component Value Date/Time   COLORURINE YELLOW 02/02/2022 0428   APPEARANCEUR CLEAR 02/02/2022 0428   LABSPEC 1.020 02/02/2022 0428   PHURINE 6.5 02/02/2022 0428   GLUCOSEU NEGATIVE 02/02/2022 0428   HGBUR NEGATIVE 02/02/2022  0428   BILIRUBINUR NEGATIVE 02/02/2022 0428   KETONESUR NEGATIVE 02/02/2022 0428   PROTEINUR 30 (A) 02/02/2022 0428   UROBILINOGEN 0.2 07/15/2013 2107   NITRITE NEGATIVE 02/02/2022 0428   LEUKOCYTESUR NEGATIVE 02/02/2022 0428   Sepsis Labs: @LABRCNTIP (procalcitonin:4,lacticidven:4)  ) Recent Results (from the past 240 hour(s))  Blood culture (routine x 2)     Status: None (Preliminary result)   Collection Time: 09/03/22  9:15 PM   Specimen: BLOOD LEFT ARM  Result Value Ref Range Status   Specimen Description   Final    BLOOD LEFT ARM Performed at [facility], [address], [place], [place] [address]    Special Requests   Final    BOTTLES DRAWN AEROBIC AND ANAEROBIC Blood Culture adequate volume Performed at [facility], [address], [place], [place] [address]    Culture   Final    NO GROWTH 3 DAYS  Performed at [facility], [address] [place], [place] [address]    Report Status PENDING  Incomplete  Blood culture (routine x 2)     Status: None (Preliminary result)   Collection Time: 09/03/22  9:26 PM   Specimen: BLOOD RIGHT FOREARM  Result Value Ref Range Status   Specimen Description   Final    BLOOD RIGHT FOREARM Performed at [facility], [address], [place], [place] [address]    Special Requests   Final    BOTTLES DRAWN AEROBIC AND ANAEROBIC Blood Culture results may not be optimal due to an inadequate volume of blood received in culture bottles Performed at [facility], [address] [place], [place] [address]    Culture   Final    NO GROWTH 3 DAYS Performed at [facility], [address], [place], [place] [address]    Report Status PENDING  Incomplete  MRSA Next Gen by PCR, Nasal     Status: None   Collection Time: 09/04/22  2:34 AM   Specimen: Nasal Mucosa; Nasal Swab  Result Value Ref Range Status   MRSA by PCR Next Gen NOT DETECTED NOT DETECTED Final    Comment: (NOTE) The  GeneXpert MRSA Assay (FDA approved for NASAL specimens only), is one component of a comprehensive MRSA colonization surveillance program. It is not intended to diagnose MRSA infection nor to guide or monitor treatment for MRSA infections. Test performance is not FDA approved in patients less than 33 years old. Performed at [facility], [address], [place], [place] [address]       Studies: No results found.  Scheduled Meds:  escitalopram  20 mg Oral Daily   [START ON 09/07/2022] predniSONE  40 mg Oral Q breakfast    Continuous Infusions:     LOS: 2 days     Briant Cedar, MD [facility]  If 7PM-7AM, please contact night-coverage [email or web address] 09/06/2022, 6:33 PM

## 2022-09-06 NOTE — Consult Note (Signed)
Consultation Note Date: 09/06/2022   Patient Name: [name]  DOB: 10-Oct-1940  MRN: [id number]  Age / Sex: 82 y.o., male   PCP: Eloisa Northern, MD Referring Physician: Briant Cedar, MD  Reason for Consultation: Establishing goals of care     Chief Complaint/History of Present Illness:   Patient is an 82 year old male with end-stage dementia, CAD status post PCI over 5 years, prostate cancer, asthma, hypertension, history of MVR, history of TIA, and subungual melanoma who was admitted on 09/03/2022 for management of shortness of breath.  Patient is a resident of a nursing home who receives total care, is bedbound, and does not speak or follow commands.  Patient had been on hospice at facility through [facility] or care.  Wife requested patient be transferred to ER for evaluation.  Patient found to have aspiration pneumonia in setting of end-stage dementia.  Palliative medicine team consulted to assist with complex medical decision making.  Extensive review of EMR prior to meeting patient.  RN informed that multiple family members wanted to discuss care with palliative medicine team so arranged for meeting at 11 AM today.  When initially presenting to bedside, no family present.  On examination of patient he appears cachectic and chronically ill.  Patient appears agitated and is showing multiple nonverbal signs of pain such as agitation, increased work of breathing, and grimacing.  Able to meet with patient's family outside of room at arranged time of 11 AM.  Present in person for meeting with patient's wife and patient's daughter.  Present over the phone was daughter's husband and other daughter along with her husband.  Introduced myself and the role of the palliative medicine team.  Spent extensive time exploring patient's past medical history.  Then spent time discussing palliative care versus hospice care and the similarities and differences between the 2. Based on knowledge as a  geriatrician, able to discussed FAST scoring of dementia.  Based on current assessment, patient is at least a FAST score of 7C potentially D.  Patient also being admitted for aspiration pneumonia.  Noted based on these qualifications, patient is appropriate for end-of-life care with hospice.  Then spent time reviewing the philosophy of hospice and the support it would and would not provide in certain settings.  Discussed with hospice focuses on quality and comfort at the end of life, not aggressive medical interventions such as coming back to the hospital.  Then spent time explaining hospice in the home setting versus hospice in a facility setting versus a small population requiring inpatient hospice admission.  Spent time answering questions regarding this.  Family wants to discuss between themselves whether they want to go home with 24/7 hired caregiver support along with hospice versus patient returning to his long-term care facility with hospice layered and.  Noted would involve TOC and [facility] liaison to assist with coordination of care.  Also able to discuss symptom management in a patient who is at end-of-life with dementia.  Discussed idea of comfort feeds and that with patient's continuous aspiration, likely to have aspiration pneumonia again.  If transition to comfort focused care, would not be working on antibiotics, IV fluids, and imaging, and instead would focus on pain and shortness of breath due to patient's underlying medical conditions that we know cannot be fixed.  Discussed use of opioids for pain and shortness of breath.  Discussed use of benzodiazepines for agitation management.  Spent time answering questions regarding this.  After this discussion, family has opted to transition  to full comfort focused care.  Their priority is on patient's symptom management at the end of life.  Again discussed this would mean discontinuing interventions that are no longer focused on symptoms such as  antibiotics, IV fluids, lab work, and imaging, and instead providing medications for symptoms like shortness of breath.  Family all acknowledged this and agreed with comfort focused plan.  Family planning to meet with [facility] or care liaison at 3 PM this afternoon.  Family will discuss amongst themselves about patient going home with 24/7 hired caregivers and hospice versus returning to facility with hospice support.  All questions answered at that time.  Thanked family for allowing me to meet with them today.  Updated care team regarding transition to full comfort focused care.  Primary Diagnoses  Present on Admission:  CAD (coronary artery disease)  CARDIAC MURMUR, SYSTOLIC  Acute respiratory failure with hypoxia (HCC)   Palliative Review of Systems: Appears agitated and shows nonverbal signs of pain  Past Medical History:  Diagnosis Date   Asthma    CAD (coronary artery disease)    Cancer (HCC) 2016   prostate   Complication of anesthesia    CO2 high according to patient    Family history of ovarian cancer    GERD (gastroesophageal reflux disease)    History of melanoma    History of prostate cancer    Hospice care patient 03/20/2021   HTN (hypertension)    Mitral valve prolapse    Osteoporosis    Severe sepsis (HCC) due to rhinovirus infection 03/13/2021   TIA (transient ischemic attack)    UTI (lower urinary tract infection)    Social History   Socioeconomic History   Marital status: Married    Spouse name: Not on file   Number of children: 2   Years of education: MS   Highest education level: Not on file  Occupational History   Occupation: retired    Associate Professor: RETIRED  Tobacco Use   Smoking status: Never   Smokeless tobacco: Never  Vaping Use   Vaping Use: Never used  Substance and Sexual Activity   Alcohol use: Not Currently    Comment: SOCIAL   Drug use: No   Sexual activity: Not Currently    Partners: Female  Other Topics Concern   Not on file   Social History Narrative   Patient drinks coffee daily       Lives with wife, daughter and son in law      Left handed      Master's degree   Social Determinants of Health   Financial Resource Strain: Not on file  Food Insecurity: Patient Unable To Answer (09/04/2022)   Hunger Vital Sign    Worried About Running Out of Food in the Last Year: Patient unable to answer    Ran Out of Food in the Last Year: Patient unable to answer  Transportation Needs: Patient Unable To Answer (09/04/2022)   PRAPARE - Administrator, Civil Service (Medical): Patient unable to answer    Lack of Transportation (Non-Medical): Patient unable to answer  Physical Activity: Not on file  Stress: Not on file  Social Connections: Not on file   Family History  Problem Relation Age of Onset   Heart disease Father    Heart disease Mother    Cirrhosis Brother    Ulcers Brother    Ovarian cancer Other        d. 24   Scheduled Meds:  arformoterol  15 mcg Nebulization BID   budesonide (PULMICORT) nebulizer solution  0.25 mg Nebulization BID   Chlorhexidine Gluconate Cloth  6 each Topical Daily   enoxaparin (LOVENOX) injection  40 mg Subcutaneous Q24H   ipratropium-albuterol  3 mL Nebulization BID   [START ON 09/07/2022] predniSONE  40 mg Oral Q breakfast   Continuous Infusions:  cefTRIAXone (ROCEPHIN)  IV     metronidazole 500 mg (09/06/22 0809)   PRN Meds:.albuterol, food thickener Allergies  Allergen Reactions   Aricept [Donepezil]     Upset stomach   Ativan [Lorazepam]     aggigation   Depakote [Divalproex Sodium]     Made me feel like a different person   Penicillins Itching and Rash    Has patient had a PCN reaction causing immediate rash, facial/tongue/throat swelling, SOB or lightheadedness with hypotension: YES Has patient had a PCN reaction causing severe rash involving mucus membranes or skin necrosis: NO Has patient had a PCN reaction that required hospitalization: NO Has  patient had a PCN reaction occurring within the last 10 years: NO If all of the above answers are "NO", then may proceed with Cephalosporin use.   CBC:    Component Value Date/Time   WBC 11.9 (H) 09/06/2022 0259   HGB 14.3 09/06/2022 0259   HGB 14.3 01/02/2017 1125   HCT 44.3 09/06/2022 0259   HCT 42.5 01/02/2017 1125   PLT 207 09/06/2022 0259   PLT 166 01/02/2017 1125   MCV 96.7 09/06/2022 0259   MCV 95.9 01/02/2017 1125   NEUTROABS 10.2 (H) 09/06/2022 0259   NEUTROABS 4.0 01/02/2017 1125   LYMPHSABS 0.6 (L) 09/06/2022 0259   LYMPHSABS 1.3 01/02/2017 1125   MONOABS 0.9 09/06/2022 0259   MONOABS 0.8 01/02/2017 1125   EOSABS 0.0 09/06/2022 0259   EOSABS 0.2 01/02/2017 1125   BASOSABS 0.0 09/06/2022 0259   BASOSABS 0.1 01/02/2017 1125   Comprehensive Metabolic Panel:    Component Value Date/Time   NA 143 09/06/2022 0259   NA 142 01/02/2017 1126   K 3.9 09/06/2022 0259   K 4.3 01/02/2017 1126   CL 107 09/06/2022 0259   CO2 27 09/06/2022 0259   CO2 30 (H) 01/02/2017 1126   BUN 47 (H) 09/06/2022 0259   BUN 22.5 01/02/2017 1126   CREATININE 1.00 09/06/2022 0259   CREATININE 1.1 01/02/2017 1126   GLUCOSE 145 (H) 09/06/2022 0259   GLUCOSE 73 01/02/2017 1126   CALCIUM 9.3 09/06/2022 0259   CALCIUM 9.5 01/02/2017 1126   AST 26 09/03/2022 2003   AST 32 01/02/2017 1126   ALT 21 09/03/2022 2003   ALT 20 01/02/2017 1126   ALKPHOS 69 09/03/2022 2003   ALKPHOS 64 01/02/2017 1126   BILITOT 0.8 09/03/2022 2003   BILITOT 0.70 01/02/2017 1126   PROT 7.3 09/03/2022 2003   PROT 6.9 01/02/2017 1126   ALBUMIN 3.6 09/03/2022 2003   ALBUMIN 3.6 01/02/2017 1126    Physical Exam: Vital Signs: BP (!) 137/56 (BP Location: Left Arm)   Pulse 94   Temp 97.7 F (36.5 C) (Axillary)   Resp 17   Ht 5\' 8"  (1.727 m)   Wt 71.8 kg   SpO2 99%   BMI 24.07 kg/m  SpO2: SpO2: 99 % O2 Device: O2 Device: Room Air O2 Flow Rate: O2 Flow Rate (L/min): 2 L/min Intake/output summary:   Intake/Output Summary (Last 24 hours) at 09/06/2022 0919 Last data filed at 09/05/2022 1200 Gross per 24 hour  Intake 189.92 ml  Output --  Net 189.92 ml   LBM: Last BM Date : 09/03/22 Baseline Weight: Weight: 71.8 kg Most recent weight: Weight: 71.8 kg  General: Awake, agitated, chronically ill-appearing, cachectic Eyes: No drainage noted HENT: Dry mucous membranes Cardiovascular: RRR Respiratory: increased work of breathing noted with accessory muscle use Abdomen: not distended Extremities: Moving extremities spontaneously at times and not to command Neuro: Confused, unable to follow commands, unable to speak coherent words          Palliative Performance Scale: 20%              Additional Data Reviewed: Recent Labs    09/05/22 0306 09/06/22 0259  WBC 9.4 11.9*  HGB 15.1 14.3  PLT 192 207  NA 140 143  BUN 33* 47*  CREATININE 1.18 1.00    Imaging: CT Angio Chest PE W and/or Wo Contrast CLINICAL DATA:  Dyspnea altered  EXAM: CT ANGIOGRAPHY CHEST WITH CONTRAST  TECHNIQUE: Multidetector CT imaging of the chest was performed using the standard protocol during bolus administration of intravenous contrast. Multiplanar CT image reconstructions and MIPs were obtained to evaluate the vascular anatomy.  RADIATION DOSE REDUCTION: This exam was performed according to the departmental dose-optimization program which includes automated exposure control, adjustment of the mA and/or kV according to patient size and/or use of iterative reconstruction technique.  CONTRAST:  75mL OMNIPAQUE IOHEXOL 350 MG/ML SOLN  COMPARISON:  Chest x-ray 09/03/2022, CT chest 02/01/2022, 07/13/2013  FINDINGS: Cardiovascular: Satisfactory opacification of the pulmonary arteries to the segmental level. No evidence of pulmonary embolism. Moderate aortic atherosclerosis. No aneurysm. Coronary vascular calcification. Normal cardiac size. No pericardial effusion  Mediastinum/Nodes: Midline  trachea. No thyroid mass. No suspicious lymph nodes. Esophagus within normal limits.  Lungs/Pleura: Chronic elevation of the right diaphragm. No pleural effusion or pneumothorax. Progressive right lower lobe consolidation with the occlusion of right lower lobe bronchus. Thickening and narrowing of right upper lobe bronchi. Probable occlusion of right middle lobe bronchus. Fluid and or debris in the trachea and mainstem bronchi. Left upper and lower lobe bronchial thickening. Minimal clustered nodularity at the left lung base.  Upper Abdomen: No acute finding  Musculoskeletal: Chronic compression deformities at T3 and T6 and T11.  Review of the MIP images confirms the above findings.  IMPRESSION: 1. Negative for acute pulmonary embolus. 2. Chronic elevation of the right diaphragm. Progressive right lower lobe consolidation with occluded appearance of right lower lobe bronchus. Correlation with bronchoscopy as indicated. Fluid and or debris within the trachea and mainstem bronchi potentially due to aspiration. There is marked bilateral bronchial wall thickening consistent with infectious or inflammatory process. 3. Minimal clustered nodularity in the left lower lobe, also suspect for respiratory infection  Aortic aneurysm NOS (ICD10-I71.9).  Electronically Signed   By: Jasmine Pang M.D.   On: 09/03/2022 23:05 DG Chest Port 1 View CLINICAL DATA:  Dyspnea  EXAM: PORTABLE CHEST 1 VIEW  COMPARISON:  02/01/2022  FINDINGS: Lung volumes are small. Elevation of the right hemidiaphragm again noted. Superimposed right basilar consolidation is present. Chin overlies the lung apices, however, no definite pneumothorax. No pleural effusion. Cardiac size is within normal limits. Pulmonary vascularity is normal. No acute bone abnormality.  IMPRESSION: 1. Pulmonary hypoinflation. 2. Elevation of the right hemidiaphragm with superimposed right basilar consolidation, atelectasis  versus infiltrate.  Electronically Signed   By: Helyn Numbers M.D.   On: 09/03/2022 20:21    I personally reviewed recent imaging.   Palliative Care Assessment and Plan Summary of Established Goals of  Care and Medical Treatment Preferences   Patient is an 82 year old male with end-stage dementia, CAD status post PCI over 5 years, prostate cancer, asthma, hypertension, history of MVR, history of TIA, and subungual melanoma who was admitted on 09/03/2022 for management of shortness of breath.  Patient is a resident of a nursing home who receives total care, is bedbound, and does not speak or follow commands.  Patient had been on hospice at facility through [facility] or care.  Wife requested patient be transferred to ER for evaluation.  Patient found to have aspiration pneumonia in setting of end-stage dementia.  Palliative medicine team consulted to assist with complex medical decision making.  # Complex medical decision making/goals of care  -Patient unable to participate in complex medical decision-making due to underlying medical status.  -Discussed care with multiple family members including patient's wife and daughters as described above in HPI.  Family has elected to transition to comfort focused care at this time in the setting of end-stage dementia with aspiration pneumonia.  Will allow for comfort feeds.  Will discontinue interventions that are no longer focused on symptoms such as IV antibiotics, imaging, and IV fluids, and will instead transition to focusing on symptom management that includes pain, dyspnea, and agitation.  -Family agreeing with continuing with [facility] or care hospice.  They are planning to meet with hospice representative this afternoon.  Family wants to discuss where hospice care will resume whether that be at home with 24/7 hired caregivers or at a facility.  Also reached out to [facility] to assist in coordination of care.  -  Code Status: DNR  Prognosis: < 6  months  # Symptom management  -Pain, in the setting of end-stage dementia with nonverbal signs of pain seen   -Start morphine concentrate 5 mg every 2 hours as needed for pain or dyspnea   -Start IV morphine 2 mg every 2 hours as needed as breakthrough to oral medications only for symptom management.  Trying to use oral medications initially as hope is patient can either go home or return to facility with appropriate symptom management.   -Anxiety, in the setting of end-stage dementia   -Start oral Ativan solution 0.5 mg every 4 hours as needed  # Psycho-social/Spiritual Support:  - Support System: Wife, daughters, son-in-laws  # Discharge Planning:  To Be Determined.  Either home with 24/7 care and hospice or returning to facility with hospice.  Should patient rapidly deteriorate from event such as aspiration, will focus on comfort care and end of life in the hospital.  Thank you for allowing the palliative care team to participate in the care Izetta Dakin.  Alvester Morin, DO Palliative Care Provider PMT # [phone]  If patient remains symptomatic despite maximum doses, please call PMT at [phone] between 0700 and 1900. Outside of these hours, please call attending, as PMT does not have night coverage.  This provider spent a total of 125 minutes providing patient's care.  Includes review of EMR, discussing care with other staff members involved in patient's medical care, obtaining relevant history and information from patient and/or patient's family, and personal review of imaging and lab work. Greater than 50% of the time was spent counseling and coordinating care related to the above assessment and plan.    *Please note that this is a verbal dictation therefore any spelling or grammatical errors are due to the "[employer]" system interpretation.

## 2022-09-06 NOTE — TOC Initial Note (Addendum)
Transition of Care [facility]) - Initial/Assessment Note    Patient Details  Name: [name] MRN: [id number] Date of Birth: Feb 17, 1941  Transition of Care [facility]) CM/SW Contact:    Lavenia Atlas, RN Phone Number: 09/06/2022, 1:45 PM  Clinical Narrative:      Received secure chat from palliative indicating patient will now be comfort care. Per chart review patient had [facility] for hospice services at facility, prior to admission. This RNCM spoke with patient's daughter Leighton Parody to discuss if the plan was to remain with [facility] for hospice services or need to offer choice. Provided Medicare.gov and a list of hospice services. This RNCM also provided a few private duty nursing agency to initiate 24/7 care services research. Family is agreeable to referral to [facility] for private duty nursing. This RNCM left voicemail message with Jeannett Senior with [facility] (re: private duty nursing), awaiting a call back. Patient's daughter reports someone provided her with [employer] brochure as well. Per Leighton Parody the family wants to discuss further re: hospice services (home vs facility). Palliative will transfer patient to room that is quiet to assist with patient's anxiety.   TOC will continue to follow for needs.  - 2:08pm This RNCM received call from Jeannett Senior with [facility] re: private duty nursing referral. Jeannett Senior will call Renna re: services.   TOC will continue to follow.                   Expected Discharge Plan:  (family is discussing (home w/hospice vs. hospice at facility)) Barriers to Discharge: Continued Medical Work up   Patient Goals and CMS Choice Patient states their goals for this hospitalization and ongoing recovery are:: comfort care (unsure of home vs facility) CMS Medicare.gov Compare Post Acute Care list provided to:: Patient Represenative (must comment) Choice offered to / list presented to : Adult Children  ownership interest in [facility].provided to::  Adult Children    Expected Discharge Plan and Services In-house Referral: Hospice / Palliative Care Discharge Planning Services: CM Consult Post Acute Care Choice: Hospice Living arrangements for the past 2 months: Single Family Home                 DME Arranged: N/A DME Agency: NA       HH Arranged: NA HH Agency: NA        Prior Living Arrangements/Services Living arrangements for the past 2 months: Single Family Home Lives with:: Adult Children, Spouse Patient language and need for interpreter reviewed:: Yes Do you feel safe going back to the place where you live?: Yes      Need for Family Participation in Patient Care: Yes (Comment) Care giver support system in place?: Yes (comment) Current home services: DME (hospital bed, home oxygen) Criminal Activity/Legal Involvement Pertinent to Current Situation/Hospitalization: No - Comment as needed  Activities of Daily Living Home Assistive Devices/Equipment: Other (Comment) (UTA; ask wife at a later time) ADL Screening (condition at time of admission) Patient's cognitive ability adequate to safely complete daily activities?: No Is the patient deaf or have difficulty hearing?: No Does the patient have difficulty seeing, even when wearing glasses/contacts?: No Does the patient have difficulty concentrating, remembering, or making decisions?: Yes Patient able to express need for assistance with ADLs?: No Does the patient have difficulty dressing or bathing?: Yes Independently performs ADLs?: No (UTA; ask wife at a later time) Communication: Dependent Is this a change from baseline?: Pre-admission baseline Dressing (OT): Dependent Is this  a change from baseline?: Pre-admission baseline Grooming: Dependent Is this a change from baseline?: Pre-admission baseline Feeding: Dependent Is this a change from baseline?: Pre-admission baseline Bathing: Dependent Is this a change from baseline?: Pre-admission baseline Toileting:  Dependent Is this a change from baseline?: Pre-admission baseline In/Out Bed: Dependent Is this a change from baseline?: Pre-admission baseline Walks in Home: Dependent Is this a change from baseline?: Pre-admission baseline Does the patient have difficulty walking or climbing stairs?: Yes Weakness of Legs: Both Weakness of Arms/Hands: Both  Permission Sought/Granted Permission sought to share information with : Case Manager Permission granted to share information with : Yes, Verbal Permission Granted  Share Information with NAME: Case Manager           Emotional Assessment Appearance:: Appears stated age Attitude/Demeanor/Rapport: Unable to Assess (end stage severe dementia) Affect (typically observed): Unable to Assess Orientation: :  (end stage dementia) Alcohol / Substance Use: Not Applicable Psych Involvement: No (comment)  Admission diagnosis:  Hypoxia [R09.02] Acute respiratory failure with hypoxia (HCC) [J96.01] Aspiration pneumonia of right lower lobe, unspecified aspiration pneumonia type [facility]) [J69.0] Patient Active Problem List   Diagnosis Date Noted   Aspiration pneumonia (HCC) 02/02/2022   Cerebrovascular disease 02/02/2022   Acute respiratory failure with hypoxia (HCC) 02/01/2022   Sepsis with end organ damage 02/01/2022   Asthma exacerbation 02/01/2022   Caregiver stress 10/04/2021   Palliative care encounter 09/03/2021   Seborrheic keratosis 07/10/2021   Inadequate oral nutritional intake 03/18/2021   Myoclonic jerking 03/17/2021   Cellulitis of right lower leg 03/14/2021   Constipation 03/14/2021   Hypomagnesemia 03/14/2021   Hypophosphatemia 03/14/2021   Acute metabolic encephalopathy 03/14/2021   Hip pain 03/14/2021   Goals of care, counseling/discussion 03/14/2021   Discoloration of nail 06/21/2019   Hammer toe, acquired 10/27/2018   Syncope 03/28/2018   Genetic testing 10/30/2017   Family history of ovarian cancer    History of prostate  cancer    History of melanoma    Moderate dementia with behavioral disturbance (HCC) 03/17/2017   Hoarseness of voice 02/26/2017   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   Amnestic MCI (mild cognitive impairment with memory loss) 06/11/2016   Sleep behavior disorder, REM 06/11/2016   TIA due to embolism (HCC) 06/11/2016   History of heart artery stent 06/11/2016   Subungual malignant melanoma (HCC) 06/19/2015   Nonrheumatic aortic valve insufficiency 06/15/2015   Malignant melanoma of finger (HCC) 06/02/2015   Gastroesophageal reflux disease 01/10/2015   Mild persistent asthma 01/10/2015   HLD (hyperlipidemia) 01/10/2015   ED (erectile dysfunction) 11/13/2014   Lower urinary tract symptoms (LUTS) 11/13/2014   Cancer of prostate with low recurrence risk (stage T1-2a and Gleason < 7 and PSA < 10) (HCC) 11/03/2014   Elevated prostate specific antigen (PSA) 09/30/2014   Asthma, moderate persistent, well-controlled 08/08/2014   Dyspnea 01/27/2014   Asthma, chronic, unspecified asthma severity, uncomplicated 01/27/2014   Hypotension, unspecified 07/15/2013   Hypotension 07/15/2013   Dyspnea and respiratory abnormality 07/13/2013   Dysphonia 05/08/2011   S/P MVR (mitral valve repair) 01/14/2011   HTN (hypertension)    Osteoporosis    CAD (coronary artery disease)    Mitral valve prolapse with severe mitral regurgitation    Diaphragm paralysis 02/09/2010   Hypertension, essential 02/05/2010   OSTEOPOROSIS 02/05/2010   CARDIAC MURMUR, SYSTOLIC 02/05/2010   COUGH 02/05/2010   PCP:  Eloisa Northern, MD Pharmacy:   [facility] [address], [place] - [address] [place]  [place] [address] Phone: [phone] Fax: [phone]  [id number] [place], [place] - [address] AT [facility] [address] [place] [address] Phone: [phone] Fax: [phone]  [facility]) - [place],  [place] - [address] [facility] [address] [place] [address] [place] [address] Phone: [phone] Fax: [phone]     Social Determinants of Health (SDOH) Social History: SDOH Screenings   Food Insecurity: Patient Unable To Answer (09/04/2022)  Housing: Patient Unable To Answer (09/04/2022)  Transportation Needs: Patient Unable To Answer (09/04/2022)  Utilities: Patient Unable To Answer (09/04/2022)  Depression (PHQ2-9): Low Risk  (04/30/2019)  Tobacco Use: Low Risk  (09/03/2022)   SDOH Interventions:     Readmission Risk Interventions    09/06/2022    1:34 PM  Readmission Risk Prevention Plan  Post Dischage Appt Complete  Medication Screening Complete  Transportation Screening Complete

## 2022-09-06 NOTE — Progress Notes (Signed)
  Daily Progress Note   Patient Name: [name]       Date: 09/06/2022 DOB: 1940-12-28  Age: 82 y.o. MRN#: [id number] Attending Physician: Briant Cedar, MD Primary Care Physician: Eloisa Northern, MD Admit Date: 09/03/2022 Length of Stay: 2 days  Will place full consult note as soon as able. Met with family. Patient transitioning to full comfort focused care at this time. Will change orders accordingly. Have update care team regarding this.   Alvester Morin, DO Palliative Care Provider PMT # [phone]

## 2022-09-07 DIAGNOSIS — J9601 Acute respiratory failure with hypoxia: Secondary | ICD-10-CM | POA: Diagnosis not present

## 2022-09-07 DIAGNOSIS — Z7189 Other specified counseling: Secondary | ICD-10-CM | POA: Diagnosis not present

## 2022-09-07 DIAGNOSIS — Z515 Encounter for palliative care: Secondary | ICD-10-CM | POA: Diagnosis not present

## 2022-09-07 DIAGNOSIS — F419 Anxiety disorder, unspecified: Secondary | ICD-10-CM | POA: Diagnosis not present

## 2022-09-07 MED ORDER — ESCITALOPRAM OXALATE 5 MG/5ML PO SOLN: 20.0000 mg | Freq: Every day | ORAL | 0 refills | Status: AC

## 2022-09-07 MED ORDER — PREDNISONE 20 MG PO TABS: 40.0000 mg | ORAL_TABLET | Freq: Every day | ORAL | 0 refills | Status: AC

## 2022-09-07 MED ORDER — MORPHINE SULFATE (CONCENTRATE) 10 MG /0.5 ML PO SOLN: 5.0000 mg | ORAL | 0 refills | Status: AC | PRN

## 2022-09-07 MED ORDER — FOOD THICKENER (SIMPLYTHICK): 1.0000 | ORAL | Status: AC | PRN

## 2022-09-07 MED ORDER — HALOPERIDOL LACTATE 2 MG/ML PO CONC: 0.6000 mg | ORAL | 0 refills | Status: AC | PRN

## 2022-09-07 NOTE — Discharge Summary (Addendum)
Physician Discharge Summary   Patient: [name] MRN: [id number] DOB: [birth year]  Admit date:     09/03/2022  Discharge date: 09/08/22  Discharge Physician: Briant Cedar   PCP: Eloisa Northern, MD   Recommendations at discharge:    Hospice care  Discharge Diagnoses: Principal Problem:   Acute respiratory failure with hypoxia [facility]) Active Problems:   CARDIAC MURMUR, SYSTOLIC   CAD (coronary artery disease)   Goals of care, counseling/discussion   High risk medication use   Anxiety   Need for emotional support   Pneumonia due to infectious organism   Aspiration into airway   Severe Alzheimer's dementia with agitation (HCC)   Counseling and coordination of care    Hospital Course: 82 year old male with advanced dementia, CAD s/p PCI >5 yrs, prostate cancer, asthma, HTN, h/o MVR, h/o TIA, and subungual melanoma. Patient is a resident of a nursing home. He is total care. He does not speak much and when he does it is incomprehensible. He is nonambulatory. Patient does eat well when fed. Patient was found by family having trouble breathing. He was noted to be gasping for air, had a fever, and wheezing. Patient was hypoxic, unclear how low. Patient is currently being followed by hospice but nursing home was having difficulty getting chest x-rays or starting antibiotics. His wife asked for him to be transferred to the ER. In the ER patient is poorly responsive. His oxygen sats was in the 80s. He was placed on a NR, satting 100%. Patient admitted for further management.     Patient and family had extensive conversation with hospice/palliative team and decision was to switch to comfort care/hospice.  Family has agreed for SNF with hospice. Patient continues to remain off O2. Pt can have comfort/puree/soft diet, as requested by family with aspiration precautions. Risk of recurrent aspiration discussed with family   Assessment and Plan:  Acute hypoxic respiratory failure Likely  aspiration pneumonia Severe Sepsis present on admission (WBC, tachypneic, tachycardic) History of asthma Currently on room air Switch to full comfort care Supplemental O2 prn Complete steroid for 3 more days SLP consulted, appreciate recs, dysphagia 1 pure, nectar thick liquid, aspiration precautions/comfort feeds   Elevated troponin History of CAD Doubt MI, more likely due to demand and infection EKG with no acute ST changes Cardiologist, Dr. Jacinto Halim was consulted by family members, no further workup, recommended hospice   Advanced dementia Goals of care discussion Palliative consulted for further goals of care discussion-switch to comfort care     Consultants: Palliative Procedures performed: None Disposition: Skilled nursing facility with hospice Diet recommendation: comfort/puree/soft diet  DISCHARGE MEDICATION: Allergies as of 09/08/2022       Reactions   Aricept [donepezil]    Upset stomach   Ativan [lorazepam]    aggigation   Depakote [divalproex Sodium]    Made me feel like a different person   Penicillins Itching, Rash   Has patient had a PCN reaction causing immediate rash, facial/tongue/throat swelling, SOB or lightheadedness with hypotension: YES Has patient had a PCN reaction causing severe rash involving mucus membranes or skin necrosis: NO Has patient had a PCN reaction that required hospitalization: NO Has patient had a PCN reaction occurring within the last 10 years: NO If all of the above answers are "NO", then may proceed with Cephalosporin use.        Medication List     STOP taking these medications    albuterol (2.5 MG/3ML) 0.083% nebulizer solution Commonly known as: PROVENTIL  Aspirin 81 MG Caps   cefdinir 300 MG capsule Commonly known as: OMNICEF   escitalopram 20 MG tablet Commonly known as: Lexapro Replaced by: escitalopram 5 MG/5ML solution   LORazepam 0.5 MG tablet Commonly known as: ATIVAN   losartan 50 MG  tablet Commonly known as: COZAAR   MULTI-VITAMIN DAILY PO   rivastigmine 4.6 mg/24hr Commonly known as: EXELON       TAKE these medications    carboxymethylcellulose 0.5 % Soln Commonly known as: REFRESH PLUS Place 1 drop into both eyes at bedtime.   escitalopram 5 MG/5ML solution Commonly known as: LEXAPRO Take 20 mLs (20 mg total) by mouth daily. Replaces: escitalopram 20 MG tablet   feeding supplement Liqd Take 237 mLs by mouth 2 (two) times daily between meals. What changed: when to take this   food thickener Gel Commonly known as: SIMPLYTHICK (NECTAR/LEVEL 2/MILDLY THICK) Take 1 packet by mouth as needed.   glycopyrrolate 1 MG tablet Commonly known as: ROBINUL Take 1 tablet (1 mg total) by mouth every 4 (four) hours as needed (excessive secretions).   haloperidol 2 MG/ML solution Commonly known as: HALDOL Place 0.3 mLs (0.6 mg total) under the tongue every 4 (four) hours as needed for up to 8 days for agitation (or delirium).   melatonin 3 MG Tabs tablet Take 3 mg by mouth at bedtime.   morphine CONCENTRATE 10 mg / 0.5 ml concentrated solution Take 0.25 mLs (5 mg total) by mouth every 4 (four) hours as needed for up to 3 days for moderate pain.   predniSONE 20 MG tablet Commonly known as: DELTASONE Take 2 tablets (40 mg total) by mouth daily with breakfast for 3 days.   REFRESH EYE ITCH RELIEF OP Apply 1 drop to eye daily as needed (For dry eyes).        Discharge Exam: Filed Weights   09/04/22 0240  Weight: 71.8 kg     Condition at discharge: fair  The results of significant diagnostics from this hospitalization (including imaging, microbiology, ancillary and laboratory) are listed below for reference.   Imaging Studies: CT Angio Chest PE W and/or Wo Contrast  Result Date: 09/03/2022 CLINICAL DATA:  Dyspnea altered EXAM: CT ANGIOGRAPHY CHEST WITH CONTRAST TECHNIQUE: Multidetector CT imaging of the chest was performed using the standard  protocol during bolus administration of intravenous contrast. Multiplanar CT image reconstructions and MIPs were obtained to evaluate the vascular anatomy. RADIATION DOSE REDUCTION: This exam was performed according to the departmental dose-optimization program which includes automated exposure control, adjustment of the mA and/or kV according to patient size and/or use of iterative reconstruction technique. CONTRAST:  75mL OMNIPAQUE IOHEXOL 350 MG/ML SOLN COMPARISON:  Chest x-ray 09/03/2022, CT chest 02/01/2022, 07/13/2013 FINDINGS: Cardiovascular: Satisfactory opacification of the pulmonary arteries to the segmental level. No evidence of pulmonary embolism. Moderate aortic atherosclerosis. No aneurysm. Coronary vascular calcification. Normal cardiac size. No pericardial effusion Mediastinum/Nodes: Midline trachea. No thyroid mass. No suspicious lymph nodes. Esophagus within normal limits. Lungs/Pleura: Chronic elevation of the right diaphragm. No pleural effusion or pneumothorax. Progressive right lower lobe consolidation with the occlusion of right lower lobe bronchus. Thickening and narrowing of right upper lobe bronchi. Probable occlusion of right middle lobe bronchus. Fluid and or debris in the trachea and mainstem bronchi. Left upper and lower lobe bronchial thickening. Minimal clustered nodularity at the left lung base. Upper Abdomen: No acute finding Musculoskeletal: Chronic compression deformities at T3 and T6 and T11. Review of the MIP images confirms the above findings.  IMPRESSION: 1. Negative for acute pulmonary embolus. 2. Chronic elevation of the right diaphragm. Progressive right lower lobe consolidation with occluded appearance of right lower lobe bronchus. Correlation with bronchoscopy as indicated. Fluid and or debris within the trachea and mainstem bronchi potentially due to aspiration. There is marked bilateral bronchial wall thickening consistent with infectious or inflammatory process. 3.  Minimal clustered nodularity in the left lower lobe, also suspect for respiratory infection Aortic aneurysm NOS (ICD10-I71.9). Electronically Signed   By: Jasmine Pang M.D.   On: 09/03/2022 23:05   DG Chest Port 1 View  Result Date: 09/03/2022 CLINICAL DATA:  Dyspnea EXAM: PORTABLE CHEST 1 VIEW COMPARISON:  02/01/2022 FINDINGS: Lung volumes are small. Elevation of the right hemidiaphragm again noted. Superimposed right basilar consolidation is present. Chin overlies the lung apices, however, no definite pneumothorax. No pleural effusion. Cardiac size is within normal limits. Pulmonary vascularity is normal. No acute bone abnormality. IMPRESSION: 1. Pulmonary hypoinflation. 2. Elevation of the right hemidiaphragm with superimposed right basilar consolidation, atelectasis versus infiltrate. Electronically Signed   By: Helyn Numbers M.D.   On: 09/03/2022 20:21    Microbiology: Results for orders placed or performed during the hospital encounter of 09/03/22  Blood culture (routine x 2)     Status: None   Collection Time: 09/03/22  9:15 PM   Specimen: BLOOD LEFT ARM  Result Value Ref Range Status   Specimen Description   Final    BLOOD LEFT ARM Performed at [facility], [address], [place], [place] [address]    Special Requests   Final    BOTTLES DRAWN AEROBIC AND ANAEROBIC Blood Culture adequate volume Performed at [facility], [address], [place], [place] [address]    Culture   Final    NO GROWTH 5 DAYS Performed at [facility], [address], [place], [place] [address]    Report Status 09/08/2022 FINAL  Final  Blood culture (routine x 2)     Status: None   Collection Time: 09/03/22  9:26 PM   Specimen: BLOOD RIGHT FOREARM  Result Value Ref Range Status   Specimen Description   Final    BLOOD RIGHT FOREARM Performed at [facility], [address] [place], [place] [address]    Special Requests   Final    BOTTLES DRAWN AEROBIC AND  ANAEROBIC Blood Culture results may not be optimal due to an inadequate volume of blood received in culture bottles Performed at [facility], [address], [place], [place] [address]    Culture   Final    NO GROWTH 5 DAYS Performed at [facility], [address], [place], [place] [address]    Report Status 09/08/2022 FINAL  Final  MRSA Next Gen by PCR, Nasal     Status: None   Collection Time: 09/04/22  2:34 AM   Specimen: Nasal Mucosa; Nasal Swab  Result Value Ref Range Status   MRSA by PCR Next Gen NOT DETECTED NOT DETECTED Final    Comment: (NOTE) The GeneXpert MRSA Assay (FDA approved for NASAL specimens only), is one component of a comprehensive MRSA colonization surveillance program. It is not intended to diagnose MRSA infection nor to guide or monitor treatment for MRSA infections. Test performance is not FDA approved in patients less than 11 years old. Performed at [facility], [address] [place], [place] [address]     Labs: CBC: Recent Labs  Lab  09/03/22 2003 09/03/22 2125 09/04/22 0318 09/05/22 0306 09/06/22 0259  WBC 12.5*  --  10.6* 9.4 11.9*  NEUTROABS 9.0*  --  7.3 8.1* 10.2*  HGB 14.9 15.0 14.8 15.1 14.3  HCT 45.0 44.0 44.4 45.7 44.3  MCV 96.8  --  96.9 97.9 96.7  PLT 167  --  172 192 207   Basic Metabolic Panel: Recent Labs  Lab 09/03/22 2003 09/03/22 2125 09/04/22 0318 09/05/22 0306 09/06/22 0259  NA 136 136 137 140 143  K 4.1 5.3* 3.8 4.4 3.9  CL 101 102 101 102 107  CO2 29  --  30 28 27   GLUCOSE 123* 118* 122* 150* 145*  BUN 30* 42* 26* 33* 47*  CREATININE 1.02 1.00 1.00 1.18 1.00  CALCIUM 8.9  --  8.9 8.9 9.3  MG  --   --  1.8  --   --    Liver Function Tests: Recent Labs  Lab 09/03/22 2003  AST 26  ALT 21  ALKPHOS 69  BILITOT 0.8  PROT 7.3  ALBUMIN 3.6   CBG: No results for input(s): "GLUCAP" in the last 168 hours.  Discharge time spent: greater than 30  minutes.  Signed: Briant Cedar, MD [facility] 09/08/2022

## 2022-09-07 NOTE — Progress Notes (Signed)
Daily Progress Note   Patient Name: [name]       Date: 09/07/2022 DOB: May 20, 1940  Age: 82 y.o. MRN#: [id number] Attending Physician: Briant Cedar, MD Primary Care Physician: Eloisa Northern, MD Admit Date: 09/03/2022 Length of Stay: 3 days  Reason for Consultation/Follow-up: Establishing goals of care  Subjective:   CC: Patient laying calm in bed though can become easily startled when awakened/touched. Spoke with patient's wife at bedside and daughter, [name], over the phone. Following up regarding complex medical decisions and symptom management.   Subjective:  Reviewed EMR prior to presenting to bedside.   When presenting to bedside, patient's wife present. Again introduced myself as a member of the palliative medicine team as she did not remember our lengthy conversation yesterday. She asked that her daughter [name] had just called and stated that [name] had talked to people at [facility] about patient returning there.  Noted would reach out to [facility] to assist with coordination of this.  While present at bedside, examined patient who appeared calm at this time.  Bedside RN joined and noted patient had recently received morphine.  Again spent time explaining to wife why patient is coughing, how patient cannot protect his airway, this all being a part of the dementia assess, and appropriately managing patient's symptoms with medication like morphine for pain and shortness of breath.  Wife acknowledged this though again did not remember this was discussed in detail yesterday.  Noted would reach out to her daughter to update her as well. While present with patient, patient's IV noted to be coming loose due to patient's agitation.  Have pulled patient's IV and noted would not replace as well only cause more agitation and hope is patient can return to [facility] with hospice soon.  Called patient's daughter, [name], and reintroduced myself as a member of the palliative medicine team though  she remembers our conversation from yesterday.  Able to update her regarding her father's care today.  Explained that patient has now lost IV access though will not replace as will only worsen patient's agitation and he can receive oral morphine.  Did express concern to Reena about needing to assist her mother more due to concerns about mild cognitive impairment.  Rena then admitted that patient's wife is undergoing workup for dementia.  When Reena expressed this, noted she will likely need to continue supporting her mother and explaining why patient is going through the things he is and why he needs to continue to receive medications for comfort.  Rena agreeing with this.  Renal also requested that hospitalist state in discharge paperwork the patient should return to facility with pured/soft diet that he has been receiving here.  Acknowledged this and that would inform hospitalist of this.  [name] did state that she spoke to [facility] today and would like to speak to case management regarding transportation to get him back there as he would likely be calmer there which I agreed with.  All questions answered at that time.  Serafina Mitchell for allowing me to speak with her today.  Review of Systems Patient appears calm at this time without increased work of breathing noted Objective:   Vital Signs:  BP 131/68   Pulse 85   Temp 98.2 F (36.8 C)   Resp 18   Ht 5\' 8"  (1.727 m)   Wt 71.8 kg   SpO2 95%   BMI 24.07 kg/m   Physical Exam: General: Awake, calm, chronically ill-appearing, cachectic Eyes: No drainage noted HENT:  Dry mucous membranes Cardiovascular: RRR Respiratory: no increased work of breathing noted currently Abdomen: not distended Extremities: Moving extremities spontaneously at times and not to command Neuro: mumbling at times/incoherent   Imaging:  I personally reviewed recent imaging.   Assessment & Plan:   Assessment: Patient is an 82 year old male with end-stage dementia,  CAD status post PCI over 5 years, prostate cancer, asthma, hypertension, history of MVR, history of TIA, and subungual melanoma who was admitted on 09/03/2022 for management of shortness of breath.  Patient is a resident of a nursing home who receives total care, is bed bound, and does not speak or follow commands.  Patient had been on hospice at facility through [facility] or care.  Wife requested patient be transferred to ER for evaluation.  Patient found to have aspiration pneumonia in setting of end-stage dementia.  Palliative medicine team consulted to assist with complex medical decision making.   Recommendations/Plan: # Complex medical decision making/goals of care:    -Patient unable to participate in complex medical decision-making due to underlying medical status.                -Discussed care with multiple family extensively on 09/06/2022.  Please see note for further details.  At that time decision was made to transition to full comfort focused care.  Family opting to continue with [facility] or care hospice upon discharge.  As per discussion with patient's daughter, [name], today the goal is to get patient back to [facility] with hospice support.  Then if appropriate, will get patient home once 24/7 support in place.  Informed TOC and hospitalist of discussion with daughter.                -  Code Status: DNR  Prognosis: < 6 months   # Symptom management                -Pain, in the setting of end-stage dementia with nonverbal signs of pain seen                               -Continue morphine concentrate 5 mg every 2 hours as needed for pain or dyspnea                               -Discontinued IV medication due to loss of IV access.  Will not replace IV as will worsen patient's agitation.                  -Anxiety, in the setting of end-stage dementia                               -Continue oral haldol solution    # Psycho-social/Spiritual Support:  - Support System: Wife, daughters,  son-in-laws   # Discharge Planning: To return to [facility] long-term care with hospice support via [facility] or care.  Family stating that if appropriate, will work to get patient transition to home with hospice once 24/7 care giving in place.  Discussed with: patient's wife, hospitalist, TOC, RN, patient's daughter [name]   Thank you for allowing the palliative care team to participate in the care Izetta Dakin.  Alvester Morin, DO Palliative Care Provider PMT # [phone]  If patient remains symptomatic despite maximum doses, please call PMT at [phone] between 0700 and 1900.  Outside of these hours, please call attending, as PMT does not have night coverage.  This provider spent a total of 41 minutes providing patient's care.  Includes review of EMR, discussing care with other staff members involved in patient's medical care, obtaining relevant history and information from patient and/or patient's family, and personal review of imaging and lab work. Greater than 50% of the time was spent counseling and coordinating care related to the above assessment and plan.    *Please note that this is a verbal dictation therefore any spelling or grammatical errors are due to the "Dragon Medical One" system interpretation.

## 2022-09-07 NOTE — TOC Progression Note (Addendum)
Transition of Care [facility]) - Progression Note    Patient Details  Name: [name] MRN: [id number] Date of Birth: 11-15-1940  Transition of Care [facility]) CM/SW Contact  Adrian Prows, RN Phone Number: 09/07/2022, 1:56 PM  Clinical Narrative:    Notified pt's dtr would like to discuss pt returning to [facility] w/ [facility] hospice; attempted to contact pt's dtr Reena Ishrani to discuss; LVM for pt's dtr at [phone]); awaiting return call; also LVM for [place] at facility; awaiting return call.  [phone] call back from [place]; she says pt can return to facility; she gave RM # 112, call report # [phone]; pt now comfort care; awaiting call back from pt's dtr Expected Discharge Plan:  (family is discussing (home w/hospice vs. hospice at facility)) Barriers to Discharge: Continued Medical Work up  Expected Discharge Plan and Services In-house Referral: Hospice / Palliative Care Discharge Planning Services: CM Consult Post Acute Care Choice: Hospice Living arrangements for the past 2 months: Single Family Home Expected Discharge Date: 09/07/22               DME Arranged: N/A DME Agency: NA       HH Arranged: NA HH Agency: NA         Social Determinants of Health (SDOH) Interventions SDOH Screenings   Food Insecurity: Patient Unable To Answer (09/04/2022)  Housing: Patient Unable To Answer (09/04/2022)  Transportation Needs: Patient Unable To Answer (09/04/2022)  Utilities: Patient Unable To Answer (09/04/2022)  Depression (PHQ2-9): Low Risk  (04/30/2019)  Tobacco Use: Low Risk  (09/03/2022)    Readmission Risk Interventions    09/06/2022    1:34 PM  Readmission Risk Prevention Plan  Post Dischage Appt Complete  Medication Screening Complete  Transportation Screening Complete

## 2022-09-07 NOTE — Progress Notes (Addendum)
[facility] 1522 [facility]) Hospitalized Hospice Patient Visit   Mr. Creach is a hospitalized hospice patient admitted to [facility] services in December of 2023 with a terminal hospice diagnosis of other symptoms and signs involving cognitive functions following other cerebrovascular disease and a secondary diagnosis of vascular dementia, unspecified severity, with mood disturbance. On 5.21.24, [facility] on-call team was called regarding patient agitation and possible aspiration pneumonia.  [facility] RN went to assess patient and family requested patient be transferred to [facility] for diagnosis and treatment. Patient was diagnosed with aspiration pneumonia and is currently admitted to 1229 at [facility]. Per Dr. Patric Dykes of [facility], this is a related hospital admission.    MSW visited with patient, patient resting comfortably and there is no family present at bedside. MSW excahanged report with Harvin Hazel, RN prior to exiting unit. Report exchanged with TOC/Edweana. Neither MSW or TOC able to confirm with family DC plans as of 4:59 pm.    Patient is appropriate for inpatient level of care due ongoing symptom management needs.    V/S:   Vitals:   09/07/22 0722 09/07/22 1333  BP: 131/68 130/84  Pulse: 85 80  Resp: 18 18  Temp: 98.2 F (36.8 C) 97.7 F (36.5 C)  SpO2: 95% 94%   I&O:  Intake/Output Summary (Last 24 hours) at 09/07/2022 1533 Last data filed at 09/07/2022 0914 Gross per 24 hour  Intake 220 ml  Output 300 ml  Net -80 ml    Abnormal lab work:   Latest Reference Range & Units 09/06/22 02:59  Glucose 70 - 99 mg/dL 161 (H)  BUN 8 - 23 mg/dL 47 (H)  WBC 4.0 - 09.6 K/uL 11.9 (H)  NEUT# 1.7 - 7.7 K/uL 10.2 (H)  Lymphocyte # 0.7 - 4.0 K/uL 0.6 (L)  Abs Immature Granulocytes 0.00 - 0.07 K/uL 0.10 (H)   Diagnostics:.  None new to report.    IVs/PRNs:   Robinul 1 MG x1 Haldol .5 mg x1 Morphine 2 MG IV x1   Problem List per MD: Assessment/Plan Present on Admission:  End stage  severe dementia  Aspiration pneumonia/ respiratory infection LLL -Pneumonia order set initiated -Blood cultures x 2 -Continue IV cefepime and vancomycin -Respiratory consult -Patient with nonrebreather -Nebulizers as needed -N.p.o., barium swallow    Elevated troponin -Doubt MI, more likely due to demand and infection -Will repeat troponin -No further workup   Mildly elevated potassium -Treating with IV fluids -BMP in a.m.    CAD (coronary artery disease)  Depression -Patient n.p.o., all p.o. medications on hold   D/C planning: Ongoing   Goals of Care:  DNR. Patient has shifted to fill comfort care   Communication with IDT: Updated.   Communication with family: N/A. No family at bedside. Called placed to Daughter/Reena w/ no answer. VM left    Please call with any hospice related questions/concerns.   Eugenie Birks, MSW [facility] Liaison [phone]

## 2022-09-08 LAB — CULTURE, BLOOD (ROUTINE X 2): Culture: NO GROWTH

## 2022-09-08 MED ORDER — GLYCOPYRROLATE 1 MG PO TABS: 1.0000 mg | ORAL_TABLET | ORAL | Status: AC | PRN

## 2022-09-08 NOTE — Progress Notes (Signed)
Attempted to call report to [facility] x3, no answer.

## 2022-09-08 NOTE — TOC Transition Note (Signed)
Transition of Care [facility]) - CM/SW Discharge Note   Patient Details  Name: [name] MRN: [id number] Date of Birth: 1940/09/22  Transition of Care [facility]) CM/SW Contact:  Adrian Prows, RN Phone Number: 09/08/2022, 10:11 AM   Clinical Narrative:    D/C orders received; spoke w/ pt's dtr [name] to confirm d/c plans; she says plan is for pt to return to [facility]/ [facility] hospice; pt's dtr notified he will be transported by [facility]; she agrees w/ d/c plan; notified [place] at facility pt will d/c today; she verified pt RM # 112, call report # [phone]; [facility] called at [phone]; spoke w/ operator # [id number]; Watt Climes Junious, [facility] Liaison notified of pt d/c; no TOC needs.   Final next level of care: Long Term Nursing Home Barriers to Discharge: No Barriers Identified   Patient Goals and CMS Choice CMS Medicare.gov Compare Post Acute Care list provided to:: Patient Represenative (must comment) Choice offered to / list presented to : Adult Children  Discharge Placement                Patient chooses bed at: [facility] Patient to be transferred to facility by: [facility] Name of family member notified: [name] [phone] [name] (wife) Patient and family notified of of transfer: 09/08/22  Discharge Plan and Services Additional resources added to the After Visit Summary for   In-house Referral: Hospice / Palliative Care Discharge Planning Services: CM Consult Post Acute Care Choice: Hospice          DME Arranged: N/A DME Agency: NA       HH Arranged: NA HH Agency: NA        Social Determinants of Health (SDOH) Interventions SDOH Screenings   Food Insecurity: Patient Unable To Answer (09/04/2022)  Housing: Patient Unable To Answer (09/04/2022)  Transportation Needs: Patient Unable To Answer (09/04/2022)  Utilities: Patient Unable To Answer (09/04/2022)  Depression (PHQ2-9): Low Risk  (04/30/2019)  Tobacco Use: Low Risk  (09/03/2022)      Readmission Risk Interventions    09/06/2022    1:34 PM  Readmission Risk Prevention Plan  Post Dischage Appt Complete  Medication Screening Complete  Transportation Screening Complete

## 2022-09-08 NOTE — Progress Notes (Signed)
  Daily Progress Note   Patient Name: [name]       Date: 09/08/2022 DOB: 11/26/1940  Age: 82 y.o. MRN#: [id number] Attending Physician: No att. providers found Primary Care Physician: Eloisa Northern, MD Admit Date: 09/03/2022 Length of Stay: 4 days  Discussed care with bedside RN today. Patient last seen by this palliative provider on 5/26. Plan is for patient to transfer back to [facility] today with [facility] hospice support. [facility] already set up for transport later today. No PMT team needs expressed currently as plan determined and symptoms managed.  Please reach out if our team can be of further assistance in the future. Thank you for involving our team in patient's care.    Alvester Morin, DO Palliative Care Provider PMT # [phone]

## 2022-09-08 NOTE — Plan of Care (Signed)

## 2022-09-17 DIAGNOSIS — G301 Alzheimer's disease with late onset: Secondary | ICD-10-CM

## 2022-09-24 DIAGNOSIS — F0390 Unspecified dementia without behavioral disturbance: Secondary | ICD-10-CM | POA: Diagnosis not present

## 2022-09-24 DIAGNOSIS — E039 Hypothyroidism, unspecified: Secondary | ICD-10-CM | POA: Diagnosis not present

## 2022-09-24 DIAGNOSIS — I10 Essential (primary) hypertension: Secondary | ICD-10-CM | POA: Diagnosis not present

## 2022-09-24 DIAGNOSIS — J4520 Mild intermittent asthma, uncomplicated: Secondary | ICD-10-CM

## 2022-09-24 DIAGNOSIS — D6859 Other primary thrombophilia: Secondary | ICD-10-CM | POA: Diagnosis not present

## 2022-10-07 ENCOUNTER — Telehealth: Payer: Medicare Other | Admitting: Neurology

## 2022-10-07 ENCOUNTER — Telehealth: Payer: Self-pay | Admitting: Neurology

## 2022-10-07 NOTE — Telephone Encounter (Signed)
Pts daughter is calling in stating that she was not able to do the virtual appt 10/07/2022 due to not having child care.  Daughter is wanting to know if it is okay for the pt to start back on Rx rivastigmine (EXELON) 4.6 MG that was discontinued the residents doctor would like for the pt to be put back on it and would like to have a call back.

## 2022-10-08 NOTE — Telephone Encounter (Signed)
Pls confirm with Reena, the message we got in December was The cost is high with the patient being in skilled nursing and the facility keeps putting them in the incorrect places.  We can certainly restart, pls confirm where to send Rx and what fax number for SNF to give order to restart. Thanks

## 2022-10-09 NOTE — Telephone Encounter (Signed)
[name] stated that she has an RX for the  rivastigmine (EXELON) 4.6 MG she said that Dr Nelson Chimes nurse practitioner Jaclyn Prime wrote the script. She said that her dad is also on hospice about to change him to palative care if you are ok with that if you are ok with it so he can get PT

## 2022-10-09 NOTE — Telephone Encounter (Signed)
That is fine. Is there anything we need to do from our side or Dr. Nelson Chimes is prescribing what they need? Thanks

## 2022-11-14 DIAGNOSIS — Z7689 Persons encountering health services in other specified circumstances: Secondary | ICD-10-CM

## 2022-11-14 DIAGNOSIS — J4520 Mild intermittent asthma, uncomplicated: Secondary | ICD-10-CM | POA: Diagnosis not present

## 2022-11-14 DIAGNOSIS — E039 Hypothyroidism, unspecified: Secondary | ICD-10-CM | POA: Diagnosis not present

## 2022-11-14 DIAGNOSIS — D6859 Other primary thrombophilia: Secondary | ICD-10-CM | POA: Diagnosis not present

## 2022-11-14 DIAGNOSIS — G301 Alzheimer's disease with late onset: Secondary | ICD-10-CM

## 2022-11-14 DIAGNOSIS — I10 Essential (primary) hypertension: Secondary | ICD-10-CM | POA: Diagnosis not present

## 2023-01-02 DIAGNOSIS — G301 Alzheimer's disease with late onset: Secondary | ICD-10-CM | POA: Diagnosis not present

## 2023-01-02 DIAGNOSIS — R2689 Other abnormalities of gait and mobility: Secondary | ICD-10-CM | POA: Diagnosis not present

## 2023-01-02 DIAGNOSIS — H04123 Dry eye syndrome of bilateral lacrimal glands: Secondary | ICD-10-CM | POA: Diagnosis not present

## 2023-01-02 DIAGNOSIS — F419 Anxiety disorder, unspecified: Secondary | ICD-10-CM | POA: Diagnosis not present

## 2023-03-12 ENCOUNTER — Other Ambulatory Visit: Payer: Self-pay | Admitting: Neurology

## 2023-04-30 ENCOUNTER — Encounter: Payer: Self-pay | Admitting: Neurology

## 2023-04-30 ENCOUNTER — Telehealth (INDEPENDENT_AMBULATORY_CARE_PROVIDER_SITE_OTHER): Payer: Medicare Other | Admitting: Neurology

## 2023-04-30 VITALS — BP 122/67 | HR 45 | Temp 97.1°F | Resp 16 | Wt 157.3 lb

## 2023-04-30 DIAGNOSIS — F03C18 Unspecified dementia, severe, with other behavioral disturbance: Secondary | ICD-10-CM

## 2023-04-30 NOTE — Patient Instructions (Signed)
 It is good to you both of you. Continue current care. Wishing you all the best. You know where to find me if you need me. Follow-up as needed, call for any changes.

## 2023-04-30 NOTE — Progress Notes (Signed)
 Virtual Visit via Video Note The purpose of this virtual visit is to provide medical care while limiting exposure to the novel coronavirus.    Consent was obtained for video visit:  Yes.   Answered questions that patient had about telehealth interaction:  Yes.    Pt location: [facility] SN[facility] Physician Location: office Name of referring provider:  Amin, Saad, MD I connected with [name] at patients initiation/request on 04/30/2023 at  3:00 PM EST by video enabled telemedicine application and verified that I am speaking with the correct person using two identifiers. Pt MRN:  [id number] Pt DOB:  May 06, 1940 Video Participants:  [name];  [name] (spouse)   History of Present Illness:  The patient had a virtual video visit on 04/30/2023. He was last seen by Memory Disorders PA Tex Filbert in March 2024 for severe dementia. His wife is present to provide the history. [name] is asleep sitting on his recliner. [name] reports he is overall doing good. He gets good care at [facility]. He is non-verbal and non-ambulatory. He is either in bed or wheelchair. They had a staff meeting and staff will start doing massages to help with circulation. He vocalizes, some words make sense, some do not. He does not follow instructions much. His behavior if very mellowed down, he looks very contented. Staff feeds him, he eats fish, vegetables, rice with no difficulty swallowing. Sleep is fine. He likes to listen to music. Medications reviewed, he is only on Lexapro  for maintenance, all other medications are prn.    History on Initial Assessment 11/23/2018: This is a 83 year old left-handed man with a history of hypertension, prostate cancer, presenting to establish care for dementia. Family reports memory changes started 3-4 years ago where he would not remember names. Later on he did not recognize friends. His wife reports taking over finances in 2019, it appears he still did the  taxes in 2019 (despite being diagnosed with dementia in 2018). He has been evaluated by neurologist Dr. Albertina Hugger in 2018.  MOCA 23/30 in March 2018, 17/30 in April 2019. He stopped driving a year ago when he would get lost or go past their house. He started going to the [facility] clinic in October 2019. He refused to do [facility] testing but was noted to have cognitive deficits in multiple cognitive domains, with affective and behavioral components of delusions, visual hallucinations, irritability. No gait or sleep component. He had side effects on Donepezil  and would miss doses of Rivastigmine . He was switched to the transdermal patch 4.6mg  daily. He had a paraneoplastic panel done which was negative. His last visit at [facility] was 2 months ago, he has seen Psychiatry and done well with addition of Lexapro  for anxiety. It was noted that dose of rivastigmine  was not increased due to syncopal episode. Diagnosis of late onset dementia,probable Alzheimer's disease, cognitive deficits are moderate to severe.    Family initially felt that the rivastigmine  patch was "overacting," so they tried administering the patch every other day. They noticed worsening and put him back on daily patches. He remembers things better and helps around the house. He can be stubborn and very demanding, wanting to get dressed and shaved early in the morning. Wife reports he is very concerned and worried, he keeps repeating shaving because it is not as good as he wants. He For the past few months, he has been rummaging a lot in the house. He does not realized ownership of things  and would pick up things that are not his and start using it. He gets confused using his electric shaver. They report sleep and appetite are good. Family reports he craves sweets. Family denies any hallucinations since starting the patches. He does not read as much. They deny any paranoia, most of the time he is calm, once in a while he gets a burst and gets mad  or upset. He was more emotional when they visited [place] 1.5 years ago, family has not noticed this recently but do wonder about depression.    He denies any headaches, dizziness, vision changes, dysarthria/dysphagia, neck/back pain, focal numbness/tingling/weakness, bowel/bladder dysfunction, anosmia, or tremors. He denies any falls then his wife reminds him she fell and he states he was carrying a lot of things then. He states he "remembers everything."    He had an MRI brain in 03/2018 which I personally reviewed showing moderate diffuse atrophy and mild chronic microvascular disease.     Current Outpatient Medications on File Prior to Visit  Medication Sig Dispense Refill   carboxymethylcellulose (REFRESH PLUS) 0.5 % SOLN Place 1 drop into both eyes at bedtime.     escitalopram  (LEXAPRO ) 5 MG/5ML solution Take 20 mLs (20 mg total) by mouth daily. 240 mL 0   food thickener (SIMPLYTHICK, NECTAR/LEVEL 2/MILDLY THICK,) GEL Take 1 packet by mouth as needed.     glycopyrrolate  (ROBINUL ) 1 MG tablet Take 1 tablet (1 mg total) by mouth every 4 (four) hours as needed (excessive secretions).     Ketotifen Fumarate (REFRESH EYE ITCH RELIEF OP) Apply 1 drop to eye daily as needed (For dry eyes).     melatonin 3 MG TABS tablet Take 3 mg by mouth at bedtime.     feeding supplement (ENSURE ENLIVE / ENSURE PLUS) LIQD Take 237 mLs by mouth 2 (two) times daily between meals. (Patient not taking: Reported on 04/30/2023) 10000 mL 0   haloperidol  (HALDOL ) 2 MG/ML solution Place 0.3 mLs (0.6 mg total) under the tongue every 4 (four) hours as needed for up to 8 days for agitation (or delirium). 15 mL 0   No current facility-administered medications on file prior to visit.     Observations/Objective:   Vitals:   04/30/23 1417  BP: 122/67  Pulse: (!) 45  Resp: 16  Temp: (!) 97.1 F (36.2 C)  SpO2: 95%  Weight: 157 lb 4.8 oz (71.4 kg)   GEN:  The patient appears stated age and is in NAD. He is sitting on  his recliner, asleep. No facial asymmetry seen.    Assessment and Plan:   This is an 83yo LH man with a history of hypertension, prostate cancer, with  severe dementia, likely due to Alzheimer's disease. He is non-verbal and non-ambulatory with comfort care at [facility]. Caregiver support provided. Family reports that when the time comes, they plan to donate his body to science. Follow-up as needed, they know to call for any questions/concerns.    Follow Up Instructions:   -I discussed the assessment and treatment plan with the patient. The patient was provided an opportunity to ask questions and all were answered. The patient agreed with the plan and demonstrated an understanding of the instructions.   The patient was advised to call back or seek an in-person evaluation if the symptoms worsen or if the condition fails to improve as anticipated.     Jhonny Moss, MD

## 2023-06-19 DIAGNOSIS — I10 Essential (primary) hypertension: Secondary | ICD-10-CM | POA: Diagnosis not present

## 2023-06-19 DIAGNOSIS — G301 Alzheimer's disease with late onset: Secondary | ICD-10-CM | POA: Diagnosis not present

## 2023-06-19 DIAGNOSIS — F419 Anxiety disorder, unspecified: Secondary | ICD-10-CM | POA: Diagnosis not present

## 2023-06-19 DIAGNOSIS — R262 Difficulty in walking, not elsewhere classified: Secondary | ICD-10-CM | POA: Diagnosis not present

## 2023-07-15 DIAGNOSIS — L602 Onychogryphosis: Secondary | ICD-10-CM | POA: Diagnosis not present

## 2023-07-15 DIAGNOSIS — L603 Nail dystrophy: Secondary | ICD-10-CM | POA: Diagnosis not present

## 2023-07-15 DIAGNOSIS — I739 Peripheral vascular disease, unspecified: Secondary | ICD-10-CM | POA: Diagnosis not present

## 2023-08-27 DIAGNOSIS — I10 Essential (primary) hypertension: Secondary | ICD-10-CM | POA: Diagnosis not present

## 2023-08-27 DIAGNOSIS — G4700 Insomnia, unspecified: Secondary | ICD-10-CM | POA: Diagnosis not present

## 2023-09-25 DIAGNOSIS — L603 Nail dystrophy: Secondary | ICD-10-CM | POA: Diagnosis not present

## 2023-09-25 DIAGNOSIS — I739 Peripheral vascular disease, unspecified: Secondary | ICD-10-CM | POA: Diagnosis not present

## 2023-09-25 DIAGNOSIS — L602 Onychogryphosis: Secondary | ICD-10-CM | POA: Diagnosis not present

## 2023-10-20 DIAGNOSIS — R062 Wheezing: Secondary | ICD-10-CM | POA: Diagnosis not present

## 2023-10-20 DIAGNOSIS — R0603 Acute respiratory distress: Secondary | ICD-10-CM | POA: Diagnosis not present

## 2023-10-20 DIAGNOSIS — I10 Essential (primary) hypertension: Secondary | ICD-10-CM | POA: Diagnosis not present

## 2023-10-20 DIAGNOSIS — G4700 Insomnia, unspecified: Secondary | ICD-10-CM | POA: Diagnosis not present

## 2023-11-26 DIAGNOSIS — L603 Nail dystrophy: Secondary | ICD-10-CM | POA: Diagnosis not present

## 2023-11-26 DIAGNOSIS — L602 Onychogryphosis: Secondary | ICD-10-CM | POA: Diagnosis not present

## 2023-11-26 DIAGNOSIS — I739 Peripheral vascular disease, unspecified: Secondary | ICD-10-CM | POA: Diagnosis not present

## 2024-01-14 DEATH — deceased
